# Patient Record
Sex: Female | Born: 1935
Health system: Southern US, Community
[De-identification: ages and names within clinical notes are randomized; demographics above are authoritative.]

## PROBLEM LIST (undated history)

## (undated) DIAGNOSIS — I2510 Atherosclerotic heart disease of native coronary artery without angina pectoris: Secondary | ICD-10-CM

## (undated) DIAGNOSIS — E785 Hyperlipidemia, unspecified: Secondary | ICD-10-CM

## (undated) DIAGNOSIS — R0600 Dyspnea, unspecified: Secondary | ICD-10-CM

## (undated) DIAGNOSIS — I739 Peripheral vascular disease, unspecified: Secondary | ICD-10-CM

## (undated) DIAGNOSIS — K219 Gastro-esophageal reflux disease without esophagitis: Secondary | ICD-10-CM

## (undated) DIAGNOSIS — Z8673 Personal history of transient ischemic attack (TIA), and cerebral infarction without residual deficits: Secondary | ICD-10-CM

## (undated) DIAGNOSIS — H3530 Unspecified macular degeneration: Secondary | ICD-10-CM

## (undated) DIAGNOSIS — K449 Diaphragmatic hernia without obstruction or gangrene: Secondary | ICD-10-CM

## (undated) DIAGNOSIS — K5790 Diverticulosis of intestine, part unspecified, without perforation or abscess without bleeding: Secondary | ICD-10-CM

## (undated) DIAGNOSIS — I639 Cerebral infarction, unspecified: Secondary | ICD-10-CM

## (undated) DIAGNOSIS — G43909 Migraine, unspecified, not intractable, without status migrainosus: Secondary | ICD-10-CM

## (undated) DIAGNOSIS — G500 Trigeminal neuralgia: Secondary | ICD-10-CM

## (undated) DIAGNOSIS — M5416 Radiculopathy, lumbar region: Secondary | ICD-10-CM

## (undated) DIAGNOSIS — R112 Nausea with vomiting, unspecified: Secondary | ICD-10-CM

## (undated) DIAGNOSIS — E119 Type 2 diabetes mellitus without complications: Secondary | ICD-10-CM

## (undated) DIAGNOSIS — C189 Malignant neoplasm of colon, unspecified: Secondary | ICD-10-CM

## (undated) DIAGNOSIS — D649 Anemia, unspecified: Secondary | ICD-10-CM

## (undated) DIAGNOSIS — I719 Aortic aneurysm of unspecified site, without rupture: Secondary | ICD-10-CM

## (undated) DIAGNOSIS — M1990 Unspecified osteoarthritis, unspecified site: Secondary | ICD-10-CM

## (undated) DIAGNOSIS — I701 Atherosclerosis of renal artery: Secondary | ICD-10-CM

## (undated) DIAGNOSIS — I700 Atherosclerosis of aorta: Secondary | ICD-10-CM

## (undated) DIAGNOSIS — F419 Anxiety disorder, unspecified: Secondary | ICD-10-CM

## (undated) DIAGNOSIS — I10 Essential (primary) hypertension: Secondary | ICD-10-CM

## (undated) DIAGNOSIS — I4719 Other supraventricular tachycardia: Secondary | ICD-10-CM

## (undated) DIAGNOSIS — K5909 Other constipation: Secondary | ICD-10-CM

## (undated) DIAGNOSIS — I471 Supraventricular tachycardia: Secondary | ICD-10-CM

## (undated) HISTORY — DX: Peripheral vascular disease, unspecified: I73.9

## (undated) HISTORY — DX: Aortic aneurysm of unspecified site, without rupture: I71.9

## (undated) HISTORY — DX: Diaphragmatic hernia without obstruction or gangrene: K44.9

## (undated) HISTORY — DX: Personal history of transient ischemic attack (TIA), and cerebral infarction without residual deficits: Z86.73

## (undated) HISTORY — DX: Type 2 diabetes mellitus without complications: E11.9

## (undated) HISTORY — DX: Diverticulosis of intestine, part unspecified, without perforation or abscess without bleeding: K57.90

## (undated) HISTORY — DX: Supraventricular tachycardia: I47.1

## (undated) HISTORY — DX: Other supraventricular tachycardia: I47.19

## (undated) HISTORY — DX: Hyperlipidemia, unspecified: E78.5

## (undated) HISTORY — DX: Unspecified macular degeneration: H35.30

## (undated) HISTORY — PX: BREAST BIOPSY: SHX20

## (undated) HISTORY — DX: Atherosclerotic heart disease of native coronary artery without angina pectoris: I25.10

## (undated) HISTORY — PX: CARDIAC SURGERY: SHX584

## (undated) HISTORY — DX: Atherosclerosis of aorta: I70.0

## (undated) HISTORY — DX: Other constipation: K59.09

## (undated) HISTORY — PX: COLONOSCOPY: SHX174

## (undated) HISTORY — DX: Unspecified osteoarthritis, unspecified site: M19.90

## (undated) HISTORY — DX: Radiculopathy, lumbar region: M54.16

## (undated) HISTORY — DX: Gastro-esophageal reflux disease without esophagitis: K21.9

## (undated) HISTORY — DX: Essential (primary) hypertension: I10

## (undated) HISTORY — PX: OTHER SURGICAL HISTORY: SHX169

## (undated) HISTORY — DX: Anxiety disorder, unspecified: F41.9

---
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
## 1979-11-13 HISTORY — PX: LUMBAR DISC SURGERY: SHX700

## 1980-11-12 DIAGNOSIS — T4145XA Adverse effect of unspecified anesthetic, initial encounter: Secondary | ICD-10-CM

## 1980-11-12 HISTORY — DX: Adverse effect of unspecified anesthetic, initial encounter: T41.45XA

## 1980-11-12 HISTORY — PX: ABDOMINAL HYSTERECTOMY: SHX81

## 1989-07-13 HISTORY — PX: BREAST BIOPSY: SHX20

## 1991-11-13 HISTORY — PX: CAROTID ENDARTERECTOMY: SUR193

## 2005-02-28 ENCOUNTER — Emergency Department (HOSPITAL_COMMUNITY): Admission: EM | Admit: 2005-02-28 | Discharge: 2005-02-28 | Payer: Self-pay | Admitting: Emergency Medicine

## 2005-03-06 ENCOUNTER — Encounter: Admission: RE | Admit: 2005-03-06 | Discharge: 2005-03-06 | Payer: Self-pay | Admitting: Internal Medicine

## 2006-01-23 ENCOUNTER — Encounter: Admission: RE | Admit: 2006-01-23 | Discharge: 2006-01-23 | Payer: Self-pay | Admitting: Internal Medicine

## 2006-01-30 ENCOUNTER — Encounter: Admission: RE | Admit: 2006-01-30 | Discharge: 2006-01-30 | Payer: Self-pay | Admitting: Internal Medicine

## 2006-08-06 ENCOUNTER — Ambulatory Visit: Payer: Self-pay | Admitting: Cardiology

## 2006-08-17 ENCOUNTER — Ambulatory Visit: Payer: Self-pay | Admitting: Cardiology

## 2006-09-06 ENCOUNTER — Ambulatory Visit: Payer: Self-pay | Admitting: Cardiology

## 2006-10-09 ENCOUNTER — Ambulatory Visit: Payer: Self-pay | Admitting: Cardiology

## 2007-04-26 ENCOUNTER — Ambulatory Visit: Payer: Self-pay | Admitting: Cardiology

## 2007-05-07 ENCOUNTER — Ambulatory Visit: Payer: Self-pay | Admitting: Internal Medicine

## 2007-06-16 ENCOUNTER — Ambulatory Visit: Payer: Self-pay | Admitting: Cardiology

## 2007-06-23 ENCOUNTER — Ambulatory Visit: Payer: Self-pay | Admitting: Cardiology

## 2007-12-16 ENCOUNTER — Ambulatory Visit: Payer: Self-pay | Admitting: Cardiology

## 2007-12-19 ENCOUNTER — Encounter: Payer: Self-pay | Admitting: Cardiology

## 2007-12-20 ENCOUNTER — Encounter: Payer: Self-pay | Admitting: Cardiology

## 2007-12-20 ENCOUNTER — Inpatient Hospital Stay (HOSPITAL_COMMUNITY): Admission: EM | Admit: 2007-12-20 | Discharge: 2007-12-23 | Payer: Self-pay | Admitting: Emergency Medicine

## 2007-12-20 ENCOUNTER — Ambulatory Visit: Payer: Self-pay | Admitting: Cardiology

## 2007-12-25 ENCOUNTER — Encounter: Payer: Self-pay | Admitting: Cardiology

## 2008-01-06 ENCOUNTER — Ambulatory Visit: Payer: Self-pay | Admitting: Cardiology

## 2008-01-30 ENCOUNTER — Ambulatory Visit: Payer: Self-pay | Admitting: Cardiology

## 2008-02-07 ENCOUNTER — Encounter: Payer: Self-pay | Admitting: Cardiology

## 2008-02-10 ENCOUNTER — Ambulatory Visit: Payer: Self-pay | Admitting: Cardiology

## 2008-06-14 ENCOUNTER — Encounter: Payer: Self-pay | Admitting: Cardiology

## 2008-07-08 ENCOUNTER — Ambulatory Visit: Payer: Self-pay | Admitting: Cardiovascular Disease

## 2008-07-08 LAB — CONVERTED CEMR LAB
Basophils Relative: 0.4 % (ref 0.0–3.0)
Calcium: 10.4 mg/dL (ref 8.4–10.5)
Chloride: 103 meq/L (ref 96–112)
Creatinine, Ser: 0.8 mg/dL (ref 0.4–1.2)
Eosinophils Relative: 2 % (ref 0.0–5.0)
GFR calc Af Amer: 91 mL/min
GFR calc non Af Amer: 76 mL/min
Hemoglobin: 12.9 g/dL (ref 12.0–15.0)
INR: 1 (ref 0.8–1.0)
Lymphocytes Relative: 25.7 % (ref 12.0–46.0)
Monocytes Relative: 10.7 % (ref 3.0–12.0)
Platelets: 236 10*3/uL (ref 150–400)
Potassium: 4 meq/L (ref 3.5–5.1)
Prothrombin Time: 11.9 s (ref 10.9–13.3)
RBC: 4.21 M/uL (ref 3.87–5.11)
RDW: 13.7 % (ref 11.5–14.6)
Sodium: 140 meq/L (ref 135–145)

## 2008-07-14 ENCOUNTER — Ambulatory Visit (HOSPITAL_COMMUNITY): Admission: RE | Admit: 2008-07-14 | Discharge: 2008-07-14 | Payer: Self-pay | Admitting: Cardiovascular Disease

## 2008-07-14 ENCOUNTER — Ambulatory Visit: Payer: Self-pay | Admitting: Cardiovascular Disease

## 2008-08-05 ENCOUNTER — Ambulatory Visit: Payer: Self-pay | Admitting: *Deleted

## 2008-08-25 ENCOUNTER — Inpatient Hospital Stay (HOSPITAL_COMMUNITY): Admission: RE | Admit: 2008-08-25 | Discharge: 2008-08-26 | Payer: Self-pay | Admitting: *Deleted

## 2008-08-25 ENCOUNTER — Ambulatory Visit: Payer: Self-pay | Admitting: *Deleted

## 2008-09-13 ENCOUNTER — Ambulatory Visit: Payer: Self-pay | Admitting: Cardiology

## 2008-09-14 ENCOUNTER — Encounter: Payer: Self-pay | Admitting: Cardiology

## 2008-09-14 DIAGNOSIS — G44229 Chronic tension-type headache, not intractable: Secondary | ICD-10-CM

## 2008-09-14 DIAGNOSIS — R000 Tachycardia, unspecified: Secondary | ICD-10-CM | POA: Insufficient documentation

## 2008-09-14 DIAGNOSIS — I471 Supraventricular tachycardia: Secondary | ICD-10-CM

## 2008-09-14 DIAGNOSIS — I2510 Atherosclerotic heart disease of native coronary artery without angina pectoris: Secondary | ICD-10-CM

## 2008-09-14 DIAGNOSIS — I739 Peripheral vascular disease, unspecified: Secondary | ICD-10-CM | POA: Insufficient documentation

## 2008-09-14 DIAGNOSIS — F411 Generalized anxiety disorder: Secondary | ICD-10-CM

## 2008-09-24 ENCOUNTER — Encounter: Payer: Self-pay | Admitting: Cardiology

## 2008-09-30 ENCOUNTER — Ambulatory Visit: Payer: Self-pay | Admitting: *Deleted

## 2008-09-30 ENCOUNTER — Encounter: Admission: RE | Admit: 2008-09-30 | Discharge: 2008-09-30 | Payer: Self-pay | Admitting: Gastroenterology

## 2008-11-02 ENCOUNTER — Ambulatory Visit: Payer: Self-pay | Admitting: Cardiology

## 2008-12-31 ENCOUNTER — Ambulatory Visit: Payer: Self-pay | Admitting: Cardiology

## 2009-01-03 ENCOUNTER — Ambulatory Visit: Payer: Self-pay | Admitting: Cardiology

## 2009-02-08 ENCOUNTER — Ambulatory Visit: Payer: Self-pay | Admitting: Cardiology

## 2009-02-16 ENCOUNTER — Encounter: Payer: Self-pay | Admitting: Cardiology

## 2009-02-17 ENCOUNTER — Encounter: Payer: Self-pay | Admitting: Cardiology

## 2009-04-06 ENCOUNTER — Encounter: Payer: Self-pay | Admitting: Cardiology

## 2009-04-06 ENCOUNTER — Encounter: Payer: Self-pay | Admitting: Cardiovascular Disease

## 2009-08-11 ENCOUNTER — Ambulatory Visit: Payer: Self-pay | Admitting: Cardiology

## 2009-08-11 DIAGNOSIS — I7389 Other specified peripheral vascular diseases: Secondary | ICD-10-CM | POA: Insufficient documentation

## 2009-08-11 DIAGNOSIS — I10 Essential (primary) hypertension: Secondary | ICD-10-CM | POA: Insufficient documentation

## 2009-08-24 ENCOUNTER — Encounter (INDEPENDENT_AMBULATORY_CARE_PROVIDER_SITE_OTHER): Payer: Self-pay | Admitting: *Deleted

## 2009-08-29 ENCOUNTER — Ambulatory Visit: Payer: Self-pay | Admitting: Cardiovascular Disease

## 2009-08-29 ENCOUNTER — Encounter (INDEPENDENT_AMBULATORY_CARE_PROVIDER_SITE_OTHER): Payer: Self-pay

## 2009-08-29 DIAGNOSIS — I70219 Atherosclerosis of native arteries of extremities with intermittent claudication, unspecified extremity: Secondary | ICD-10-CM | POA: Insufficient documentation

## 2009-08-31 ENCOUNTER — Ambulatory Visit: Payer: Self-pay | Admitting: Cardiovascular Disease

## 2009-08-31 ENCOUNTER — Ambulatory Visit (HOSPITAL_COMMUNITY): Admission: RE | Admit: 2009-08-31 | Discharge: 2009-09-01 | Payer: Self-pay | Admitting: Cardiovascular Disease

## 2009-09-13 ENCOUNTER — Encounter: Payer: Self-pay | Admitting: Cardiology

## 2009-09-14 ENCOUNTER — Ambulatory Visit: Payer: Self-pay | Admitting: Cardiology

## 2009-10-14 IMAGING — CR DG CHEST 1V PORT
1 series · 1 of 1 positions shown · non-contrast
Comparison: none

CLINICAL DATA: Short of breath

Portable chest at 657:
No previous for comparison. Minimal linear scarring or atelectasis of the left
lung base. Right lung clear. Heart size normal. No effusion. Visualized bones
unremarkable.

[AP]
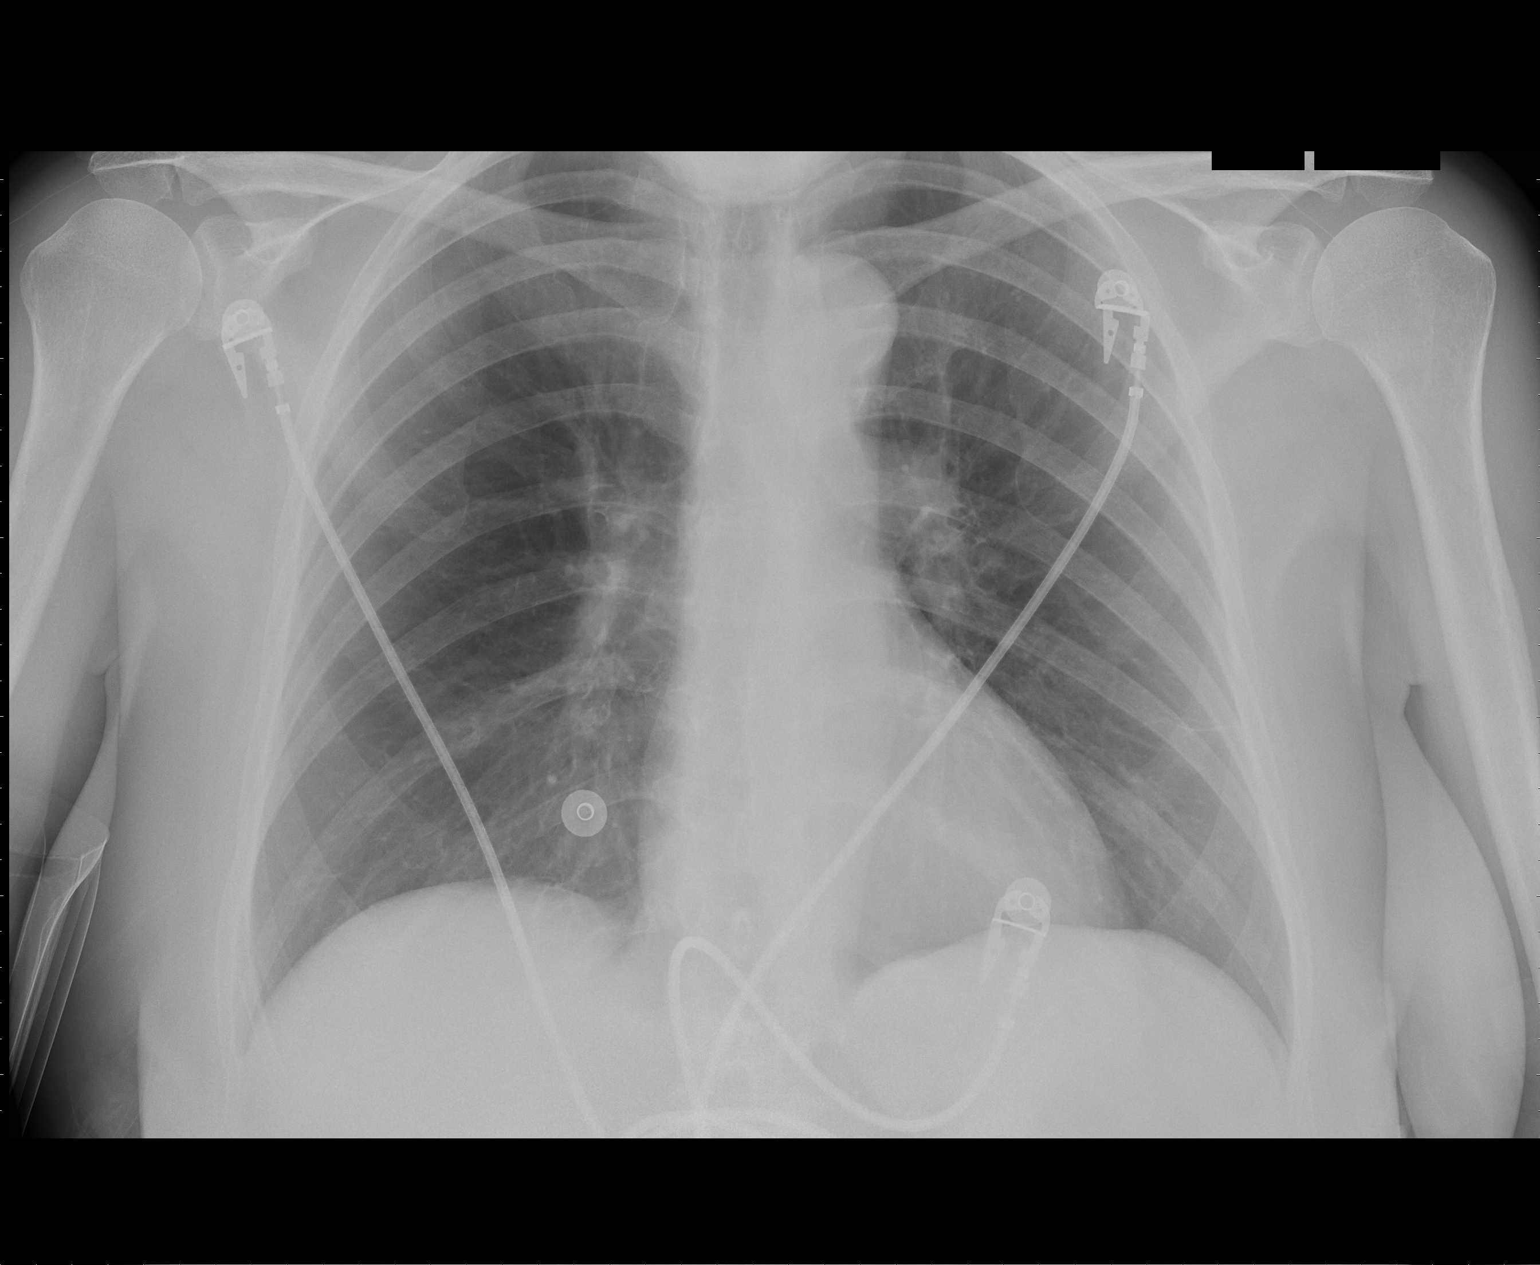

[1 of 1 positions shown; findings below may reference images not displayed]

IMPRESSION: 1. Minimal linear scarring or atelectasis, left lower lung

## 2009-10-21 ENCOUNTER — Encounter: Payer: Self-pay | Admitting: Cardiovascular Disease

## 2009-10-21 ENCOUNTER — Ambulatory Visit: Payer: Self-pay | Admitting: Vascular Surgery

## 2009-11-29 ENCOUNTER — Encounter: Payer: Self-pay | Admitting: Cardiology

## 2010-02-16 ENCOUNTER — Encounter: Payer: Self-pay | Admitting: Cardiology

## 2010-04-03 ENCOUNTER — Ambulatory Visit: Payer: Self-pay | Admitting: Cardiology

## 2010-04-03 DIAGNOSIS — E785 Hyperlipidemia, unspecified: Secondary | ICD-10-CM | POA: Insufficient documentation

## 2010-04-03 DIAGNOSIS — E7800 Pure hypercholesterolemia, unspecified: Secondary | ICD-10-CM | POA: Insufficient documentation

## 2010-04-20 ENCOUNTER — Telehealth (INDEPENDENT_AMBULATORY_CARE_PROVIDER_SITE_OTHER): Payer: Self-pay | Admitting: *Deleted

## 2010-06-09 ENCOUNTER — Encounter: Payer: Self-pay | Admitting: Cardiology

## 2010-06-14 ENCOUNTER — Encounter: Payer: Self-pay | Admitting: Cardiology

## 2010-06-15 ENCOUNTER — Telehealth (INDEPENDENT_AMBULATORY_CARE_PROVIDER_SITE_OTHER): Payer: Self-pay | Admitting: *Deleted

## 2010-08-04 ENCOUNTER — Ambulatory Visit: Payer: Self-pay | Admitting: Cardiology

## 2010-11-09 ENCOUNTER — Ambulatory Visit: Payer: Self-pay | Admitting: Vascular Surgery

## 2010-12-03 ENCOUNTER — Encounter: Payer: Self-pay | Admitting: Internal Medicine

## 2010-12-10 LAB — CONVERTED CEMR LAB
BUN: 19 mg/dL (ref 6–23)
Basophils Absolute: 0 10*3/uL (ref 0.0–0.1)
CO2: 30 meq/L (ref 19–32)
Calcium: 10.2 mg/dL (ref 8.4–10.5)
Eosinophils Absolute: 0.1 10*3/uL (ref 0.0–0.7)
HCT: 36.3 % (ref 36.0–46.0)
INR: 1 (ref 0.8–1.0)
Lymphs Abs: 0.9 10*3/uL (ref 0.7–4.0)
MCHC: 34 g/dL (ref 30.0–36.0)
MCV: 85.4 fL (ref 78.0–100.0)
Neutro Abs: 1.9 10*3/uL (ref 1.4–7.7)
Platelets: 231 10*3/uL (ref 150.0–400.0)
Prothrombin Time: 9.9 s (ref 9.1–11.7)
RDW: 13.5 % (ref 11.5–14.6)
Sodium: 137 meq/L (ref 135–145)
WBC: 3.2 10*3/uL — ABNORMAL LOW (ref 4.5–10.5)

## 2010-12-12 NOTE — Assessment & Plan Note (Signed)
Summary: heart skipping, dizziness --agh   Visit Type:  Follow-up Primary Provider:  Dr. Sherril Croon   History of Present Illness: the patient is a 75 year old female with a history of aortic penetrating ulcer requiring endograft repair September 2009. The patient has moderate coronary artery disease. She has a history of difficult to control hypertension. she has unilateral renal artery stenosis. She also has a significant stenosis of the right iliofemoral limb of her endograft causing exertional claudication. Her claudication symptoms however have much improved. She walks on a regular basis. She denies any chest pain. She has no shortness of breath orthopnea PND.  The patient reports occasional palpitations. She denies any dizziness or weakness. She reports however no sustained arrhythmias. She continues to have right leg pain. Headaches overall much improved.   Preventive Screening-Counseling & Management  Alcohol-Tobacco     Smoking Status: quit     Year Quit: 1981  Current Medications (verified): 1)  Adult Aspirin Low Strength 81 Mg Tbdp (Aspirin) .... Take 1 Tablet By Mouth Once A Day 2)  Colace 100 Mg Caps (Docusate Sodium) .... Take 1 Capsule By Mouth Two Times A Day 3)  Lipitor 40 Mg Tabs (Atorvastatin Calcium) .... Take 1/2 Tab (20mg ) At Bedtime 4)  Clonazepam 0.5 Mg Tabs (Clonazepam) .... Take 1/4 Tablet At Bedtime 5)  Labetalol Hcl 200 Mg Tabs (Labetalol Hcl) .... Take 1 Tablet By Mouth Two Times A Day 6)  Lisinopril 20 Mg Tabs (Lisinopril) .... Take 1 Tab By Mouth At Bedtime 7)  Prilosec Otc 20 Mg Tbec (Omeprazole Magnesium) .... Take 1 Tablet By Mouth Once A Day 8)  Icaps Mv  Tabs (Multiple Vitamins-Minerals) .... Take 1 Tablet By Mouth Two Times A Day 9)  Advil 200 Mg Tabs (Ibuprofen) .... As Needed 10)  Oscal 500/200 D-3 500-200 Mg-Unit Tabs (Calcium-Vitamin D) .... Take 1 Tablet By Mouth Two Times A Day 11)  Miralax  Powd (Polyethylene Glycol 3350) .Marland Kitchen.. 17gm Mixed With 8oz  Water or Juice  By Mouth Daily As Needed  Allergies (verified): 1)  ! Codeine 2)  Premarin 3)  Suprax  Comments:  Nurse/Medical Assistant: The patient's medication bottles and allergies were reviewed with the patient and were updated in the Medication and Allergy Lists.  Past History:  Past Medical History: Last updated: September 23, 2008 hypertension poorly controlled history of left carotid endarterectomy.  Carotid arteries patent 2009 recurrent tachypalpitations.  Cardiac monitor x 2.  Evaluated by Dr. Alberteen Spindle in June of 2008 atrial tachycardia ruled out, adrenaline or excess adrenergic sensitivity suspected.  Cardiolite October 2007, status post cardiac catheterization in the mid 90s, nonobstructive coronary artery disease. Diabetes mellitus, per patient diet control. Chronic migraine headaches. Macular degeneration chronic left lower quadrant pain, not resolved post stenting aortic penetrating ulcer distal aortic penetrating ulcer, treated with endograft.  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 Left-sided diverticulosis noted on colonoscopy April 2007 chronic constipation, patient states secondary to verapamil but present prior to its use esophageal reflux disease questionable cough on ACE inhibitors, currently tolerating chronic low back pain, lumbosacral spine changes were noted on MRI scan with left radiculopathy February of 2006  Past Surgical History: Last updated: 09/23/2008  Penetrating atherosclerotic ulcer, infrarenal   aorta.   Placement of Endologix PowerLink bifurcated covered stent   graft. status-post hysterectomy tatus post tonsillectomy  Family History: Last updated: 23-Sep-2008 Mother died age 70 with a history of coronary artery   disease and congestive heart failure.  Father died age 18 of  an MI.   Three deceased brothers with histories of diabetes and coronary artery   disease.  Three deceased sisters with similar  pathology.   Social History: Last updated: 09/14/2008 The patient is married with one child.  She is a   retired Solicitor at [employer].  No recent tobacco use.  Discontinued   tobacco in 1981.  No regular alcohol use.      Risk Factors: Smoking Status: quit (08/04/2010) Passive Smoke Exposure: no (09/14/2008)  Past Cardiac History:  [facility] Hospitalizations:  DATE OF DISCHARGE:  09/01/2009                                  DISCHARGE SUMMARY      PROCEDURES:   1. Left subclavian angiogram.   2. Abdominal aortogram with runoff to the feet.   3. Selective right renal angiography.   4. Two-view chest x-ray.      PRIMARY FINAL DISCHARGE DIAGNOSIS:  Peripheral vascular disease with   total right iliac occlusion, urine drug screen referral made.      SECONDARY DIAGNOSES:   1. Status post aortic graft-stent placement for distal aortic ulcer.   2. Remote history of tobacco use.   3. Headaches.   4. Anxiety.   5. Tachycardia.   6. Allergy or intolerance to CODEINE, PREMARIN, and SUPRAX.   7. Status post left carotid endarterectomy.   8. History of nonobstructive coronary artery disease by cardiac       catheterization greater than 10 years ago, Cardiolite in 2007       without ischemia.   9. Diabetes.   10.Macular degeneration.   11.Right trigeminal neuralgia.   12.Diverticulosis.   13.Gastroesophageal reflux disease.   14.Low back pain.   15.Status post hysterectomy and tonsillectomy.   16.Family history of coronary artery disease in her father.   Review of Systems       The patient complains of fatigue and anxiety.  The patient denies malaise, fever, weight gain/loss, vision loss, decreased hearing, hoarseness, chest pain, palpitations, shortness of breath, prolonged cough, wheezing, sleep apnea, coughing up blood, abdominal pain, blood in stool, nausea, vomiting, diarrhea, heartburn, incontinence, blood in urine, muscle weakness, joint pain, leg swelling, rash, skin lesions,  headache, fainting, dizziness, depression, enlarged lymph nodes, easy bruising or bleeding, and environmental allergies.    Vital Signs:  Patient profile:   75 year old female Height:      62 inches Weight:      124 pounds Pulse rate:   87 / minute BP sitting:   190 / 115  (left arm) Cuff size:   regular  Vitals Entered By: Carlye Grippe (August 04, 2010 10:21 AM)  Serial Vital Signs/Assessments:  Time      Position  BP       Pulse  Resp  Temp     By 10:25 AM            173/109  82                    Carlye Grippe 10:26 AM            173/89   76                    Carlye Grippe  Comments: 10:25 AM left arm/reg cuff By: Carlye Grippe  10:26 AM right arm/reg cuff By: Carlye Grippe    Physical Exam  Additional Exam:  General: Well-developed, well-nourished in no distress head: Normocephalic and atraumatic eyes PERRLA/EOMI intact, conjunctiva and lids normal nose: No deformity or lesions mouth normal dentition, normal posterior pharynx neck: Supple, no JVD.  No masses, thyromegaly or abnormal cervical nodes lungs: Normal breath sounds bilaterally without wheezing.  Normal percussion heart: regular rate and rhythm with normal S1 and S2, no S3 or S4.  PMI is normal.  No pathological murmurs abdomen: Normal bowel sounds, abdomen is soft and nontender without masses, organomegaly or hernias noted.  No hepatosplenomegaly musculoskeletal: Back normal, normal gait muscle strength and tone normal pulsus: unable to palpate pulse in right posterior tibial and dorsalis pedis. Pulses in left leg normal. Extremities: No peripheral pitting edema neurologic: Alert and oriented x 3 skin: Intact without lesions or rashes cervical nodes: No significant adenopathy psychologic: Normal affect    Impression & Recommendations:  Problem # 1:  CHRONIC TENSION TYPE HEADACHE (ICD-339.12) Assessment Improved  Problem # 2:  ATHEROSLERO NATV ART EXTREM W/INTERMIT CLAUDICAT  (ICD-440.21) the patient has a known stenosis of the right iliofemoral limb of her endograft causing exertional claudication. Symptoms are stable. She has been evaluated by vascular surgery.  Problem # 3:  ESSENTIAL HYPERTENSION, BENIGN (ICD-401.1) blood pressure under poor control. We will increase labetalol to 200 mg p.o. b.i.d. The following medications were removed from the medication list:    Hydrochlorothiazide 25 Mg Tabs (Hydrochlorothiazide) .Marland Kitchen... Take 1/2 tablet daily Her updated medication list for this problem includes:    Adult Aspirin Low Strength 81 Mg Tbdp (Aspirin) .Marland Kitchen... Take 1 tablet by mouth once a day    Labetalol Hcl 200 Mg Tabs (Labetalol hcl) .Marland Kitchen... Take 1 tablet by mouth two times a day    Lisinopril 20 Mg Tabs (Lisinopril) .Marland Kitchen... Take 1 tab by mouth at bedtime  Problem # 4:  GENERALIZED ANXIETY DISORDER (ICD-300.02) Assessment: Comment Only  Other Orders: EKG w/ Interpretation (93000)  Patient Instructions: 1)  Increase Labetalol to 200mg  two times a day  2)  Follow up in  6 months Prescriptions: LABETALOL HCL 200 MG TABS (LABETALOL HCL) Take 1 tablet by mouth two times a day  #180 x 0   Entered by:   Hoover Brunette, LPN   Authorized by:   Lewayne Bunting, MD, [facility]   Signed by:   Hoover Brunette, LPN on 16/08/9603   Method used:   Faxed to ...       [facility] [employer] education [employer])       [address] [address]       [place], [place]  [address]       Ph: [phone]       Fax: [phone]   RxID:   [id number]

## 2010-12-12 NOTE — Assessment & Plan Note (Signed)
Summary: 6 MO FU PER MAY REMINDE   Visit Type:  Follow-up Primary Provider:  Dr. Sherril Croon   History of Present Illness: the patient is a 75 year old female with a history of aortic penetrating ulcer requiring endograft repair September 2009. The patient has moderate coronary artery disease. She has a history of difficult to control hypertension. she has unilateral renal artery stenosis. She also has a significant stenosis of the right iliofemoral limb of her endograft causing exertional claudication. Her claudication symptoms however have much improved. She walks on a regular basis. She denies any chest pain. She has no shortness of breath orthopnea PND.  The patient also has peripheral vascular disease including right carotid endarterectomy. She follows up with her primary care physician regarding this. She has a known left soft carotid bruit. She still gets intermittent headaches her mood overall is improved significantly as he sleeping better. From a cardiovascular standpoint is stable and she denies any orthopnea PND palpitations or syncope.  The patient also complains of some muscle aches and pain in both hips and she wants to decrease her Lipitor.  Preventive Screening-Counseling & Management  Alcohol-Tobacco     Smoking Status: quit  Comments: smoked for about 1 yr, quit in 1981  Current Medications (verified): 1)  Adult Aspirin Low Strength 81 Mg Tbdp (Aspirin) .... Take 1 Tablet By Mouth Once A Day 2)  Colace 100 Mg Caps (Docusate Sodium) .... Take 1 Capsule By Mouth Two Times A Day 3)  Lipitor 40 Mg Tabs (Atorvastatin Calcium) .... Take 1/2 Tab (20mg ) At Bedtime 4)  Clonazepam 0.5 Mg Tabs (Clonazepam) .... Take 1/4 Tablet At Bedtime 5)  Hydrochlorothiazide 25 Mg Tabs (Hydrochlorothiazide) .... Take 1/2 Tablet Daily 6)  Labetalol Hcl 200 Mg Tabs (Labetalol Hcl) .... Take 1/2 Tablet Every Morning and 1 Tablet At Bedtime 7)  Lisinopril 20 Mg Tabs (Lisinopril) .... Take 1 Tab By Mouth  At Bedtime 8)  Prilosec Otc 20 Mg Tbec (Omeprazole Magnesium) .... Take 1 Tablet By Mouth Once A Day 9)  Icaps Mv  Tabs (Multiple Vitamins-Minerals) .... Take 1 Tablet By Mouth Two Times A Day 10)  Advil 200 Mg Tabs (Ibuprofen) .... As Needed 11)  Oscal 500/200 D-3 500-200 Mg-Unit Tabs (Calcium-Vitamin D) .... Take 1 Tablet By Mouth Two Times A Day  Allergies: 1)  ! Codeine 2)  Premarin 3)  Suprax  Comments:  Nurse/Medical Assistant: The patient's medications were reviewed with the patient and were updated in the Medication List. Pt brought medication bottles to office visit.  Cyril Loosen, RN, BSN (Apr 03, 2010 9:44 AM)  Past History:  Past Medical History: Last updated: 09/14/2008 hypertension poorly controlled history of left carotid endarterectomy.  Carotid arteries patent 2009 recurrent tachypalpitations.  Cardiac monitor x 2.  Evaluated by Dr. Alberteen Spindle in June of 2008 atrial tachycardia ruled out, adrenaline or excess adrenergic sensitivity suspected.  Cardiolite October 2007, status post cardiac catheterization in the mid 90s, nonobstructive coronary artery disease. Diabetes mellitus, per patient diet control. Chronic migraine headaches. Macular degeneration chronic left lower quadrant pain, not resolved post stenting aortic penetrating ulcer distal aortic penetrating ulcer, treated with endograft.  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 Left-sided diverticulosis noted on colonoscopy April 2007 chronic constipation, patient states secondary to verapamil but present prior to its use esophageal reflux disease questionable cough on ACE inhibitors, currently tolerating chronic low back pain, lumbosacral spine changes were noted on MRI scan with left radiculopathy February of 2006  Past Surgical  History: Last updated: September 29, 2008  Penetrating atherosclerotic ulcer, infrarenal   aorta.   Placement of Endologix PowerLink bifurcated covered  stent   graft. status-post hysterectomy tatus post tonsillectomy  Family History: Last updated: Sep 29, 2008 Mother died age 45 with a history of coronary artery   disease and congestive heart failure.  Father died age 71 of an MI.   Three deceased brothers with histories of diabetes and coronary artery   disease.  Three deceased sisters with similar pathology.   Social History: Last updated: 2008/09/29 The patient is married with one child.  She is a   retired Solicitor at [employer].  No recent tobacco use.  Discontinued   tobacco in 1981.  No regular alcohol use.      Risk Factors: Smoking Status: quit (04/03/2010) Passive Smoke Exposure: no (09-29-2008)  Past Cardiac History:  [facility] Hospitalizations:  DATE OF DISCHARGE:  09/01/2009                                  DISCHARGE SUMMARY      PROCEDURES:   1. Left subclavian angiogram.   2. Abdominal aortogram with runoff to the feet.   3. Selective right renal angiography.   4. Two-view chest x-ray.      PRIMARY FINAL DISCHARGE DIAGNOSIS:  Peripheral vascular disease with   total right iliac occlusion, urine drug screen referral made.      SECONDARY DIAGNOSES:   1. Status post aortic graft-stent placement for distal aortic ulcer.   2. Remote history of tobacco use.   3. Headaches.   4. Anxiety.   5. Tachycardia.   6. Allergy or intolerance to CODEINE, PREMARIN, and SUPRAX.   7. Status post left carotid endarterectomy.   8. History of nonobstructive coronary artery disease by cardiac       catheterization greater than 10 years ago, Cardiolite in 2007       without ischemia.   9. Diabetes.   10.Macular degeneration.   11.Right trigeminal neuralgia.   12.Diverticulosis.   13.Gastroesophageal reflux disease.   14.Low back pain.   15.Status post hysterectomy and tonsillectomy.   16.Family history of coronary artery disease in her father.   Review of Systems       The patient complains of fatigue.  The patient denies  malaise, fever, weight gain/loss, vision loss, decreased hearing, hoarseness, chest pain, palpitations, shortness of breath, prolonged cough, wheezing, sleep apnea, coughing up blood, abdominal pain, blood in stool, nausea, vomiting, diarrhea, heartburn, incontinence, blood in urine, muscle weakness, joint pain, leg swelling, rash, skin lesions, headache, fainting, dizziness, depression, anxiety, enlarged lymph nodes, easy bruising or bleeding, and environmental allergies.    Vital Signs:  Patient profile:   75 year old female Height:      62 inches Weight:      124.50 pounds BMI:     22.85 Pulse rate:   76 / minute BP sitting:   145 / 81  (left arm) Cuff size:   regular  Vitals Entered By: Cyril Loosen, RN, BSN (Apr 03, 2010 9:39 AM) Comments Folow up appt, No cardiac complaints   Physical Exam  Additional Exam:  General: Well-developed, well-nourished in no distress head: Normocephalic and atraumatic eyes PERRLA/EOMI intact, conjunctiva and lids normal nose: No deformity or lesions mouth normal dentition, normal posterior pharynx neck: Supple, no JVD.  No masses, thyromegaly or abnormal cervical nodes lungs: Normal breath sounds bilaterally without wheezing.  Normal  percussion heart: regular rate and rhythm with normal S1 and S2, no S3 or S4.  PMI is normal.  No pathological murmurs abdomen: Normal bowel sounds, abdomen is soft and nontender without masses, organomegaly or hernias noted.  No hepatosplenomegaly musculoskeletal: Back normal, normal gait muscle strength and tone normal pulsus: unable to palpate pulse in right posterior tibial and dorsalis pedis. Pulses in left leg normal. Extremities: No peripheral pitting edema neurologic: Alert and oriented x 3 skin: Intact without lesions or rashes cervical nodes: No significant adenopathy psychologic: Normal affect    EKG  Procedure date:  04/03/2010  Findings:      normal sinus rhythm low-voltage QRS. Heart rate 67  beats per minute  Impression & Recommendations:  Problem # 1:  ATHEROSLERO NATV ART EXTREM W/INTERMIT CLAUDICAT (ICD-440.21)  Orders: EKG w/ Interpretation (93000)  Problem # 2:  ESSENTIAL HYPERTENSION, BENIGN (ICD-401.1) blood pressure under better control. Continue current medical regimen Her updated medication list for this problem includes:    Adult Aspirin Low Strength 81 Mg Tbdp (Aspirin) .Marland Kitchen... Take 1 tablet by mouth once a day    Hydrochlorothiazide 25 Mg Tabs (Hydrochlorothiazide) .Marland Kitchen... Take 1/2 tablet daily    Labetalol Hcl 200 Mg Tabs (Labetalol hcl) .Marland Kitchen... Take 1/2 tablet every morning and 1 tablet at bedtime    Lisinopril 20 Mg Tabs (Lisinopril) .Marland Kitchen... Take 1 tab by mouth at bedtime  Orders: EKG w/ Interpretation (93000)  Problem # 3:  GENERALIZED ANXIETY DISORDER (ICD-300.02)  Problem # 4:  BEN HTN HEART DISEASE WITHOUT HEART FAIL (ICD-402.10) Assessment: Comment Only  Her updated medication list for this problem includes:    Adult Aspirin Low Strength 81 Mg Tbdp (Aspirin) .Marland Kitchen... Take 1 tablet by mouth once a day    Hydrochlorothiazide 25 Mg Tabs (Hydrochlorothiazide) .Marland Kitchen... Take 1/2 tablet daily    Labetalol Hcl 200 Mg Tabs (Labetalol hcl) .Marland Kitchen... Take 1/2 tablet every morning and 1 tablet at bedtime    Lisinopril 20 Mg Tabs (Lisinopril) .Marland Kitchen... Take 1 tab by mouth at bedtime  Problem # 5:  DYSLIPIDEMIA (ICD-272.4) the patient wants to cut her Lipitor in half. She feels she has to myalgias related to her statin. I told her to followup with lipid panel in 8 weeks. Her updated medication list for this problem includes:    Lipitor 40 Mg Tabs (Atorvastatin calcium) .Marland Kitchen... Take 1/2 tab (20mg ) at bedtime  Patient Instructions: 1)  Decrease Lipitor to 20mg  at bedtime 2)  Labs:  8 weeks - check lipids  3)  Follow up in  1 year

## 2010-12-12 NOTE — Progress Notes (Signed)
Summary: need appointment  Phone Note Other Incoming   Summary of Call: Message left on voice mail stating she would like to get appointment with GD as soon as possible. Left message to return call.  Hoover Brunette, LPN  June 15, 2010 2:41 PM   Follow-up for Phone Call        Pt. c/o feeling like heart starts and stops, skips.  States has been running on average 140-150's/75-80's.  Heart rate running around 60 - 70's.  Has been going on now x 2-3 weeks.  Does c/o dizziness with constant headache.  Patient cancelled appt. with Dr. Excell Seltzer because she did not feel like that was the direction that she needed to go. Follow-up by: Hoover Brunette, LPN,  June 15, 2010 4:33 PM  Additional Follow-up for Phone Call Additional follow up Details #1::        OK schedule 1st available.  Additional Follow-up by: Lewayne Bunting, MD, [facility],  June 18, 2010 6:01 PM    Additional Follow-up for Phone Call Additional follow up Details #2::    Patient notified.   OV scheduled for 9/23 at 10:15.  Follow-up by: Hoover Brunette, LPN,  June 19, 2010 5:15 PM

## 2010-12-12 NOTE — Progress Notes (Signed)
Summary: Dr. Garrison [place]   Phone Note Other Incoming   Summary of Call: Per Dr. Andee Lineman, pt. recently in ER for elevated blood pressure.  Dr. Sherril Croon sent her home.  Needs to have appt. scheduled with Dr. Excell Seltzer to evaluate for refractory hypertension and poss. renal artery stenosis.  Appointment scheduled for:    Tuesday, June 28 at 4:15.  Patient notified.   Hoover Brunette, LPN  April 20, 1609 2:33 PM

## 2010-12-12 NOTE — Miscellaneous (Signed)
Summary: Orders Update - [facility]   Clinical Lists Changes  Orders: Added new Test order of T-Lipid Profile [phone]) - Signed Added new Test order of T-Hepatic Function [phone]) - Signed

## 2011-02-15 ENCOUNTER — Ambulatory Visit (INDEPENDENT_AMBULATORY_CARE_PROVIDER_SITE_OTHER): Payer: Medicare Other

## 2011-02-15 ENCOUNTER — Encounter (INDEPENDENT_AMBULATORY_CARE_PROVIDER_SITE_OTHER): Payer: Medicare Other

## 2011-02-15 DIAGNOSIS — I7092 Chronic total occlusion of artery of the extremities: Secondary | ICD-10-CM

## 2011-02-15 DIAGNOSIS — I70219 Atherosclerosis of native arteries of extremities with intermittent claudication, unspecified extremity: Secondary | ICD-10-CM

## 2011-02-15 LAB — CBC
MCV: 86.1 fL (ref 78.0–100.0)
MCV: 86.8 fL (ref 78.0–100.0)
Platelets: 238 10*3/uL (ref 150–400)
RBC: 3.81 MIL/uL — ABNORMAL LOW (ref 3.87–5.11)
WBC: 4.4 10*3/uL (ref 4.0–10.5)
WBC: 4.7 10*3/uL (ref 4.0–10.5)

## 2011-02-15 LAB — BASIC METABOLIC PANEL
Chloride: 103 mEq/L (ref 96–112)
Creatinine, Ser: 0.86 mg/dL (ref 0.4–1.2)
GFR calc Af Amer: >60 mL/min (ref >60)

## 2011-02-15 LAB — GLUCOSE, CAPILLARY
Glucose-Capillary: 102 mg/dL — ABNORMAL HIGH (ref 70–99)
Glucose-Capillary: 87 mg/dL (ref 70–99)

## 2011-02-15 NOTE — H&P (Signed)
HISTORY AND PHYSICAL EXAMINATION  February 15, 2011  Re:  [name], [name]                DOB:  [birth year]  HISTORY OF PRESENT ILLNESS:  The patient is a 75 year old female who presents today for routine follow-up with lower extremity arterial duplex.  The patient is status post Endologix stent graft placement by Dr. Madilyn Fireman in October 2009.  The patient was initially seen and followed by Dr. Madilyn Fireman, at which time she had palpable femoral pulses and normal ABIs.  She then did not return for follow-up appointments.  Since that time, she had been seen by Dr. Arbie Cookey in December 2000 after she had recently undergone an arteriogram by Dr. Excell Seltzer.  This had revealed an occlusion of her right limb of the Endologix aortobifemoral graft with reconstitution of the external iliac via internal iliac collaterals. She had normal SFA, popliteal, and 3-vessel runoffs bilaterally.  At that time, the patient did report moderate claudication in her right leg; however, it was life-limiting.  She denied rest pain and wounds at that time.  The patient also denied left lower extremity symptoms.  ABIs were not performed at that time.  Dr. Arbie Cookey explained to the patient that she should return in 1 year for continued follow-up with repeat ABIs.  She was also instructed to call if her claudication symptoms progressed or worsened.  Today the patient presents after having lower extremity arterial duplex performed.  The patient continues to note intermittent claudication in the right lower extremity.  She feels it has not progressed or changed since her last evaluation by Dr. Arbie Cookey in 2010.  She still denies rest pain and tissue loss.  She also denies symptoms in the left lower extremity.  The patient is able to carry out her activities of daily living with limited interruption.  The patient denies chest pain, shortness of breath, nausea, vomiting, signs of TIA, CVA, diarrhea, and  constipation.  PAST MEDICAL HISTORY: 1. Penetrating aortic ulcer, status post Endologix stent graft     placement in October 2009 by Dr. Madilyn Fireman. 2. Non insulin dependent diabetes mellitus. 3. Hypertension. 4. Hypercholesterolemia. 5. Palpitations. 6. Migraine headaches. 7. Macular degeneration. 8. Status post ruptured disk repair. 9. Hysterectomy. 10.Left carotid endarterectomy. 11.Tonsillectomy.  SOCIAL HISTORY:  The patient is married with 1 child.  She is retired. She quit smoking in 1981, and she does not drink alcohol on a regular basis.  FAMILY HISTORY:  CHF and coronary artery disease.  MEDICATIONS: 1. Aspirin 81 mg daily. 2. Colace t.i.d. 3. Lipitor 40 mg 1/2 tablet q.h.s. 4. Clonazepam  0.5 mg 1/4 tablets q.h.s. 5. Labetalol 200 mg 1 p.o. b.i.d. 6. Lisinopril 20 mg 1 q.h.s. 7. Prilosec 20 mg daily. 8. Advil p.r.n. 9. Os-Cal daily. 10.MiraLax daily.  ALLERGIES:  CONTRAST DYE, which the patient states she believes causes an elevation in her blood pressure.  REVIEW OF SYSTEMS:  The patient notes claudication, palpitations, reflux, constipation, chronic headaches, and frequent urination.  A complete review of systems is otherwise negative.  PHYSICAL EXAM:  In general, this is a well-nourished female in no acute distress.  Her blood pressure is noted to be 215/99 in the right arm sitting with an O2 saturation of 98% and a heart rate of 73.  HEENT: PERRL, EOMI.  Conjunctivae are normal and pink.  Lungs: Clear to auscultation.  Cardiovascular: Regular rate and rhythm.  Abdomen: Soft, nontender with active bowel sounds.  Musculoskeletal: No major deformities or  cyanosis are noted.  There are 2+ radial pulses present bilaterally.  There are 2+ left femoral popliteal and posterior tibial pulses.  The right femoral and distal pulses are not palpable. Bilateral lower extremities are warm and pink.  Neuro: No focal weakness or paresthesias.  Skin: No ulcers, no  rashes.  IMAGING:  Lower extremity arterial duplex performed on February 15, 2011 reveals an ABI of 0.56 on the right and 1.04 on the left.  These are compared with ABIs performed in 2009 which revealed an ABI of 0.9 on the right and 0.9 on the left.  She currently has biphasic flow in the right posterior tibial and dorsalis pedis and triphasic flow in the left posterior tibial and dorsalis pedis.  ASSESSMENT: 1. Status post Endologix stent graft with known occluded right limb. 2. Uncontrolled hypertension, diabetes mellitus, and hyperlipidemia.  PLAN: 1. The patient's claudication symptoms are stable.  She continues to     deny rest pain and tissue loss.  The patient is currently     comfortable with her symptoms status.  She will follow up in 6     months with a repeat lower extremity arterial duplex and ABIs.  She     understands that she will contact us immediately if she notices     progression of her claudication or she develops rest pain or tissue     loss. 2. The patient's uncontrolled hypertension is being addressed by her     cardiologist, Dr. Andee Lineman.  The patient took a half tablet of     clonidine here in the office, as she states that was what she was     instructed to by her cardiologist if her pressure was noted to be     elevated.  She is instructed to continue to monitor her blood     pressure closely and contact Dr. Andee Lineman with all information     regarding this.  She has a follow-up upon with Dr. Andee Lineman next week     regarding her blood pressure.  The remainder of her medical issues     will continue to be followed by her primary care physician.  Pecola Leisure, PA  Charles E. Fields, MD Electronically Signed  AY/MEDQ  D:  02/15/2011  T:  02/15/2011  Job:  [id number]

## 2011-02-27 ENCOUNTER — Encounter: Payer: Self-pay | Admitting: *Deleted

## 2011-02-27 ENCOUNTER — Encounter: Payer: Self-pay | Admitting: Cardiology

## 2011-02-27 ENCOUNTER — Ambulatory Visit (INDEPENDENT_AMBULATORY_CARE_PROVIDER_SITE_OTHER): Payer: Medicare Other | Admitting: Cardiology

## 2011-02-27 VITALS — BP 185/94 | HR 76 | Ht 61.0 in | Wt 124.0 lb

## 2011-02-27 DIAGNOSIS — I119 Hypertensive heart disease without heart failure: Secondary | ICD-10-CM

## 2011-02-27 DIAGNOSIS — I2510 Atherosclerotic heart disease of native coronary artery without angina pectoris: Secondary | ICD-10-CM

## 2011-02-27 DIAGNOSIS — I70219 Atherosclerosis of native arteries of extremities with intermittent claudication, unspecified extremity: Secondary | ICD-10-CM

## 2011-02-27 DIAGNOSIS — R000 Tachycardia, unspecified: Secondary | ICD-10-CM

## 2011-02-27 DIAGNOSIS — I10 Essential (primary) hypertension: Secondary | ICD-10-CM

## 2011-02-27 DIAGNOSIS — R0602 Shortness of breath: Secondary | ICD-10-CM

## 2011-02-27 DIAGNOSIS — I779 Disorder of arteries and arterioles, unspecified: Secondary | ICD-10-CM | POA: Insufficient documentation

## 2011-02-27 DIAGNOSIS — R079 Chest pain, unspecified: Secondary | ICD-10-CM

## 2011-02-27 NOTE — Patient Instructions (Signed)
Lexiscan stress test  Your physician wants you to follow up in: 6 months.  You will receive a reminder letter in the mail one-two months in advance.  If you don't receive a letter, please call our office to schedule the follow up appointment

## 2011-02-27 NOTE — Assessment & Plan Note (Signed)
No further work-up required 

## 2011-02-27 NOTE — Assessment & Plan Note (Signed)
The patient is on good blood pressure regimen. Overall her blood pressures have been relatively controlled. Her blood pressure is more than 170 mmHg she takes a half a dose of clonidine which normalizes her blood pressure.

## 2011-02-27 NOTE — Assessment & Plan Note (Signed)
Patient had increased symptoms of shortness of breath. Will order Lexi scan. Last ischemia evaluation was 5 years ago. Her last catheterization was in the 90s.

## 2011-02-27 NOTE — Progress Notes (Signed)
HPI The patient is a 75 year old female with a history of aortic penetrating ulcer requiring endograft repair September 2009. The patient has moderate coronary artery disease. She has unilateral renal artery stenosis and in the past for difficult to control hypertension. She also has significant stenosis of the right iliofemoral limb of her endograft causing exertional claudication. Her last visit in May of 2011 however she reported that her claudication symptoms have much improved. She continues to walk on a regular basis. In addition the patient has peripheral vascular disease including right carotid endarterectomy. She typically follows up with her primary care physician regarding this. She has a known left soft carotid bruit. Last lipid and liver function tests were done proximally year ago. They were within normal limits. The patient states that her blood pressure still runs high at times. This occurs when she particularly under stress. However she cannot tolerate higher doses of medications. When the patient noticed that her blood pressures greater than 180 she will take a half a dose of clonidine which then normalized her blood pressure and she treats herself when necessary. She had recent ABIs done and unfortunately it appears that stenosis of the right iliofemoral limb of her endograft has worsened. Her ABI went from 0.9 to 0.5. She does report quite severe claudication on minimal exertion however her limb does not appear to be patent. Although I cannot feel any pulses are limb still feels warm and there is no ulcers or evidence of gangrene or discoloration. She has an appointment scheduled with Dr. early to followup.  The patient also reports episodes of weak spells and shortness of breath on exertion. She denies any chest pain.  Allergies  Allergen Reactions  . Cefixime     REACTION: short of breath  . Codeine     REACTION: dizziness  . Conjugated Estrogens     REACTION: flushing  .  Iohexol      Desc: pt has anxiety and an irregular heartbeat. contrast exacerbates this. pt was very uncomfortable after a 20cc bolus for an miroi. we did w/o iv 11/09 per dr. Eppie Gibson. pt will talk w/ md about this and will probably not get iv contrast in the future at her re, Onset Date: [id number]     Current Outpatient Prescriptions on File Prior to Visit  Medication Sig Dispense Refill  . aspirin 81 MG tablet Take 81 mg by mouth daily.        Marland Kitchen atorvastatin (LIPITOR) 40 MG tablet Take 20 mg by mouth at bedtime.       . calcium-vitamin D (OSCAL WITH D) 500-200 MG-UNIT per tablet Take 1 tablet by mouth 2 (two) times daily.        . clonazePAM (KLONOPIN) 0.5 MG tablet 1/4 tab by mouth at bedtime      . docusate sodium (COLACE) 100 MG capsule Take 100 mg by mouth 2 (two) times daily.        Marland Kitchen ibuprofen (ADVIL,MOTRIN) 200 MG tablet Take 200 mg by mouth every 6 (six) hours as needed.        . labetalol (NORMODYNE) 200 MG tablet Take 1/2 by mouth in morning and 1 by mouth at bedtime      . lisinopril (PRINIVIL,ZESTRIL) 20 MG tablet Take 20 mg by mouth daily.        Marland Kitchen omeprazole (PRILOSEC) 20 MG capsule Take 20 mg by mouth daily.       . polyethylene glycol (MIRALAX / GLYCOLAX) packet Take 17 g by mouth  every other day.       Marland Kitchen DISCONTD: Multiple Vitamins-Minerals (ICAPS MV PO) Take 1 capsule by mouth 2 (two) times daily.          Past Medical History  Diagnosis Date  . Hypertension     poorly controlled  . Arrhythmia     tachypalpitations cardiac moniter time 2 evaluated by Dr.Kline in june 2008 atrial tachycardia ruled out,adrenaline or excess adrenergic sensitivity ruled out  . Normal nuclear stress test     10/2007staus post cardiac cath in the mid 90s nonobstructive coronary artery disease  . Diabetes mellitus     per patient diet control  . History of migraine headaches     chronic  . Macular degeneration   . Left lower quadrant pain     not resolved post stenting aortic penetrating  ulcer distal aortic pentrating ulcer treated with endograft 07/2008  . Anxiety disorder     probable somatic anxiety disorder  . Diverticulosis     left sided noted on colonoscopy 02/2006  . Chronic constipation     secondary to verapamil but present priot to use  . Esophageal reflux disease   . Allergy to ACE inhibitors     questionable cough on ace inhibitors currently tolerating  . Chronic low back pain     lumbosacral spine changes were noted in MRI scan with left radiculopathy 12/2004  . History of tonsillectomy     Past Surgical History  Procedure Date  . Carotid endarterectomy   . Right trigeminal neuralgia   . Atherosclerotic ulcer     penetrating atherosclerotic elcer infrarenal aorta  . Endologix powerlink bifurcated      covered stent graft  . Abdominal hysterectomy     Family History  Problem Relation Age of Onset  . Heart failure Mother   . Coronary artery disease Mother   . Heart attack Father     History   Social History  . Marital Status: Married    Spouse Name: N/A    Number of Children: N/A  . Years of Education: N/A   Occupational History  . 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 Smoking status: Former Smoker    Quit date: 11/12/1986  . Smokeless tobacco: Not on file   Comment: smoked for 1 year  . Alcohol Use: No  . Drug Use: No  . Sexually Active: Not on file   Other Topics Concern  . Not on file   Social History Narrative  . No narrative on file   Review of systems:Pertinent positives as outlined above. The remainder of the 18  point review of systems is negative   PHYSICAL EXAM BP 185/94  Pulse 76  Ht 5\' 1"  (1.549 m)  Wt 124 lb (56.246 kg)  BMI 23.43 kg/m2  General: Well-developed, well-nourished in no distress Head: Normocephalic and atraumatic Eyes:PERRLA/EOMI intact, conjunctiva and lids normal Ears: No deformity or lesions Mouth:normal dentition, normal posterior pharynx Neck: Supple, no JVD.   No masses, thyromegaly or abnormal cervical nodes, soft bilateral carotid bruits, well-healed left endarterectomy scar Lungs: Normal breath sounds bilaterally without wheezing.  Normal percussion Cardiac: regular rate and rhythm with normal S1 and S2, no S3 or S4.  PMI is normal.  No pathological murmurs Abdomen: Normal bowel sounds, abdomen is soft and nontender without masses, organomegaly or hernias noted.  No hepatosplenomegaly MSK: Back normal, normal gait muscle strength and tone normal Vascular: Absent pulses dorsalis  pedis and posterior tibial pulses, right side palpable on the left side   Extremities: No peripheral pitting edema Neurologic: Alert and oriented x 3 Skin: Intact without lesions or rashes Lymphatics: No significant adenopathy Psychologic: Normal affect  ECG:  ASSESSMENT AND PLAN

## 2011-02-27 NOTE — Assessment & Plan Note (Addendum)
ABIs have significantly worsened. ABI change from 0.9 to 0.5 on the right side.Endograft repair penetrating ulcer aorta 2009. Worsening limb ischemia in the right leg secondary to stenosis of the right iliofemoral limb of the endograft. Patient has an appointment scheduled with Dr. early. Currently there is no evidence of significant critical limb ischemia

## 2011-03-02 DIAGNOSIS — I10 Essential (primary) hypertension: Secondary | ICD-10-CM

## 2011-03-02 DIAGNOSIS — R079 Chest pain, unspecified: Secondary | ICD-10-CM

## 2011-03-05 DIAGNOSIS — R0602 Shortness of breath: Secondary | ICD-10-CM

## 2011-03-27 NOTE — Discharge Summary (Signed)
NAMEKILLIAN, RESS                ACCOUNT NO.:  [id number]   MEDICAL RECORD NO.:  [id number]          PATIENT TYPE:  INP   LOCATION:  3729                         FACILITY:  [facility]   PHYSICIAN:  Maple Mirza, PA   DATE OF BIRTH:  1936/06/18   DATE OF ADMISSION:  12/20/2007  DATE OF DISCHARGE:  12/23/2007                               DISCHARGE SUMMARY   ALLERGIES:  1. ERYTHROMYCIN.  2. CEFOTAXIME.  3. BETA-BLOCKERS WHICH CAUSED HEADACHE.  4. I THINK CALAN.   FINAL DIAGNOSES:  1. Admitted with recurrent tachy palpitations.      a.     Refractory to treatment with p.r.n. verapamil causing this       admission.      b.     troponin I studies were all normal.      c.     Beta-blocker exacerbates headaches.  2. Computed tomography of the chest negative for pulmonary embolism.  3. P wave similar to sinus rhythm in her tachycardia.  It is felt that      the arrhythmia is driven by catecholamines (possibly a      inappropriate sinus tachycardia).      a.     Patient offered a second electrophysiology evaluation by Dr.       Graciela Husbands (they will think about this).      b.     Pheochromocytoma workup is pending at the time of this       discharge.  4. Gastrointestinal workup this admission was negative.  Gallbladder      ultrasound negative.      a.     Outpatient followup with Dr. Madilyn Fireman they have his card.   SECONDARY DIAGNOSES:  1. Extracranial cerebrovascular occlusive disease status post left      carotid endarterectomy.  2. Normal stress test with ejection fraction of 63%, October 2007.  3. Hypertension.  4. Dyslipidemia.  5. Type 2 diabetes.  6. 2-D echocardiogram, August 2008, ejection fraction 55% to 60%.  7. Migrainous chronic headache.  8. Macular degeneration.  9. Anxiety.  10.Trigeminal neuralgia.  11.Status post hysterectomy.  12.Status post tonsillectomy.   CONSULT THIS ADMISSION:  Neurology.  The patient was seen by Neurology  for facial pain which is triggered  by cool air and not limited by a  trigeminal distribution.  Neurology summation:  Nothing to offer  either, not tried or rejected.  They suggested the following abortive  medications:  1. Triptans-relatively contraindicated.  2. NSAIDs-refuses  being too harsh.  3. Beta-blocker.  The patient had paradoxic reaction  exacerbating headaches. 4. Topamax-possible cognitive changes.  5. No  Depakote with the possibility of liver problems.  6. Tricyclic  medications increase heart rate.  The patient has outpatient evaluation  by Dr. Anne Hahn.   BRIEF HISTORY:  Ms. Standley is a 75 year old female who has a history of  tachy palpitations and is being treated with verapamil.  She takes  verapamil on a rescue basis as needed.  She is followed at the [facility] office at [facility] on February 3 and  4th with recurrent  refractory tachy palpitations.  Verapamil did not work to slow them  down.  The patient had been started on low-dose beta-blocker of 75 mg  daily.  In addition a 24-hour urine for catecholamines and metanephrines  was obtained.  The patient has followup with Dr. Andee Lineman in 2-to-3 weeks.   Basically details of her hospital visit here at [facility] as  mentioned from 3 branches of medicine:   1. Electrophysiology representing Cardiology with nothing really to      add except to look forward to the pheochromocytoma workup, except a      referral to a secondary electrophysiologist.  2. Also from Gastroenterology, a negative workup including negative      gallbladder ultrasound but with outpatient followup with Dr. Madilyn Fireman.  3. From neurology abortive meds for her chronic nagging headache were      suggested but all rejected for either side effects or potential      side effects, but the patient has an outpatient evaluation with Dr.      Anne Hahn.      Maple Mirza, PA     GM/MEDQ  D:  12/30/2007  T:  12/31/2007  Job:  [id number]   cc:   Antonietta Breach, M.D.  Duke Salvia, MD, [facility]  Learta Codding, MD,FACC  Dr. Eliberto Ivory

## 2011-03-27 NOTE — Assessment & Plan Note (Signed)
[facility]                          [facility] OFFICE NOTE   NAME:[name]                       MRN:          [id number]  DATE:12/31/2008                            DOB:          11-30-1935    REFERRING PHYSICIAN:  Doreen Beam, MD   HISTORY OF PRESENT ILLNESS:  This patient is a 75 year old female with  difficult to control blood pressure.  The patient also at times  experienced palpitations.  She stated that her blood pressure has been  running normal at home, but in our office here they are quite high,  actually I measured them 200/100 mmHg.  She also has by CT scan of the  neck evidence of significant left subclavian stenosis, although I was  unable to measure a significant difference between both arms, there is  only approximate 20 mm of difference.  The patient states at time she  gets still dizzy.  It appears to be related to elevations in her blood  pressure.  I can also not exclude if she has symptomatic subclavian  steal syndrome.   MEDICATIONS:  Are listed in the chart include;  1. Clonidine 0.1 nightly.  2. Lisinopril and hydrochlorothiazide 20/25 mg p.o. daily half-tablet.  3. Clonazepam 0.5 mg p.o. daily.  4. Aspirin 81 mg a day.  5. Omeprazole 20 mg a day.  6. Verapamil 240 mg a day.  7. Colace 100 mg p.o. b.i.d.  8. Lipitor 40 mg p.o. daily.  9. Multivitamin b.i.d.  10.Atenolol 25 mg half tablet p.o. daily.  11.MiraLax 17 g p.o. daily   PHYSICAL EXAMINATION:  VITAL SIGNS:  Blood pressure is 170/90; but on  repeat 200/100; heart rate 65; respirations 16; and weight is 130  pounds.  NECK:  Normal carotid upstroke.  No carotid bruits.  LUNGS:  Clear breath sounds bilaterally.  HEART:  Regular rate and rhythm.  Normal S1 and S2.  No murmur, rubs, or  gallops.  ABDOMEN:  Soft and nontender.  No rebound or guarding.  Good bowel  sounds.  EXTREMITIES:  No cyanosis, clubbing, or edema.   PROBLEM LIST:  1. Hypertensive  heart disease.  2. Tachycardia.  3. Peripheral vascular disease.  4. Generalized anxiety disorder.  5. Chronic tension-type headache.   PLAN:  1. I suspect the patient's blood pressure elevation may well be      causing some of her problems with headache.  I do not think her      blood pressure cuff is accurate and I asked her to come to the      office to measure her blood pressure cuff.  I increased her      lisinopril/hydrochlorothiazide to full tablet a day as well as      clonidine to 0.1 mg p.o. b.i.d.  2. The patient will follow up with me in the next couple of weeks.     Learta Codding, MD,FACC     GED/MedQ  DD: 12/31/2008  DT: 01/01/2009  Job #: [id number]   cc:   Doreen Beam, MD

## 2011-03-27 NOTE — Consult Note (Signed)
[name], [name]                ACCOUNT NO.:  [id number]   MEDICAL RECORD NO.:  [id number]          PATIENT TYPE:  INP   LOCATION:  3729                         FACILITY:  [facility]   PHYSICIAN:  Deanna Artis. Hickling, M.D.DATE OF BIRTH:  February 06, 1936   DATE OF CONSULTATION:  12/20/2007  DATE OF DISCHARGE:                                 CONSULTATION   CHIEF COMPLAINT:  Chronic daily headaches.   HISTORY OF PRESENT ILLNESS:  I was asked by Dr. Dietrich Pates to see [name], a 75 year old woman previously seen at [facility] by my partner, Dr. Lesia Sago.  I do not have any of Dr.  Anne Hahn' notes concerning his visits but the patient has apparently had  headaches for 8-10 years.  These are chronic daily headaches that are  prominent at the vertex region of her head and associated with pounding  pain.  She has some sensitivity to light related to her macular  degeneration but not her headaches and does not claim sensitivity to  sound or movement.  She says that if she talks to somebody for a long  time, the headache may intensify.  She does not have any nausea or  vomiting with the headaches and has not had nausea until this  hospitalization when she had nausea plus palpitations.   Patient has mild chronic neck pain that began when she was in a ride at  [facility], [place], a number of years ago.  To this day, she  will prop a pillow under her neck which gives her comfort.   The patient has not had any closed head injuries or other nervous system  infections.   REVIEW OF SYSTEMS:  CONSTITUTIONAL:  The patient had elevated  temperature a week ago, (101.3).  She had headache that was somewhat  worse than her baseline and has nasal discharge which she thought was  related to a sinusitis that has improved.  She also had some bloody  phlegm.   The patient's main cardiovascular problem is palpitations.  Main GI  complaint was nausea without vomiting.   PAST  MEDICAL HISTORY:  Positive for  1. Hypertension.  2. Diet controlled diabetes mellitus.  3. Left carotid vessel disease treated with endarterectomy.  4. Palpitations that were improved with metoprolol but at the cost of      causing increase headache which is a paradoxical reaction.  5. Macular degeneration.  6. A history of trigeminal neuralgia that on further examination      seems to be not limited to the trigeminal area and to be somewhat      atypical in its presentation.   PAST SURGICAL HISTORY:  1. Hysterectomy.  2. Tonsillectomy.  3. Appendectomy.   FAMILY HISTORY:  Mother died of cancer at age 40.  She had myocardial  infarctions in her 25s.  Father died of heart attack at age 53.  She is  the youngest of nine.  There are three siblings left.   SOCIAL HISTORY:  The patient lives in [place], [place], with her  husband.  She is married.  She has one daughter.  She smoked tobacco for  a year when she was in her 105s.  She does not drink alcohol, use drugs  or herbs.  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  She  said it was a very stressful job.   CURRENT MEDICATIONS:  1. Verapamil 180 mg a day (discontinued).  2. Zestoretic 20/25 one half tablet daily.  3. Lipitor 40 mg daily.  4. Xanax 0.25 mg one half to one tablet three times a day as needed.  5. Aspirin 81 mg daily.  6. Prilosec 20 mg daily.  7. Colace 100 mg daily.  8. Zithromax 750 mg until December 20, 2007, due to sinus infection.  9. Astelin nose spray.   ALLERGIES AND INTOLERANCES:  CODEINE, ESTROGEN, CEFOTAXIME and BETA-  BLOCKERS, the latter of which cause headaches.   PHYSICAL EXAMINATION:  GENERAL APPEARANCE:  Well-developed, nourished  woman in no acute distress, right-handed.  VITAL SIGNS:  Temperature 99.6, blood pressure 153/75, resting pulse  124, respirations 20, capillary glucose has ranged from 154-195, oxygen  saturation 99% on room air.  Weight 58.9 kg, height 62.4  inches.  HEAD, EYES, EARS, NOSE AND THROAT:  No signs of infection.  Supple neck,  full range of motion.  No cranial or cervical bruits.  No localized  tenderness in the head and neck region.  The patient did not have  limitation of range of motion in the head and neck.  She has bilateral  carotid bruits that are soft.  LUNGS:  Clear to auscultation.  HEART:  No murmurs.  Pulses normal.  ABDOMEN:  Soft, nontender.  Bowel sounds normal.  EXTREMITIES:  Well-formed without edema, cyanosis, alterations in tone  or tight heel cords.  SKIN:  No lesions.  VASCULAR:  Tone was normal.  NEUROLOGIC:  Mental status:  The patient is awake, alert, attentive and  appropriate, no dysphasia, dyspraxia, names objects, follows commands.  Conveys thoughts and feelings.  Cranial nerves:  Round reactive pupils.  Normal fundi.  Full visual fields to double simultaneous stimuli.  Extraocular movements full and conjugate.  The patient has scarring in  her macular regions.  As soon as I asked her look at the light so that I  could look at her macular regions, she turned very quickly away so I  never could see the scarring.  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   Motor examination:  Normal strength, tone and mass.  Good fine motor  movements.  No pronator drift.  Sensation intact to cold, vibration and  stereognosis.  She has a mild peripheral stocking neuropathy.  Gait was  not tested.  Reflexes were absent.  The patient had bilateral flexor  plantar responses.   IMPRESSION:  1. Chronic daily headache disorder, migrainous without many of the      elements of migraines. (784.0)  2. Cervical spondylosis which is not likely to cause her headaches.  3. Atypical face pain triggered by blowing cool air on her face.  This      is not clearly limited to the trigeminal distribution and I am not      certain that it represents trigeminal neuralgia.   PLAN:  I discussed abortive medications.  Triptans are relatively   contraindicated for her.  She will not use NSAIDs because they are too  harsh.  I discussed preventative medications.  Beta-blockers  paradoxically make her headaches worse.  Verapamil initially helped.  I  do not  know if Cardizem was ever tried.  She does not want to use  Topamax because of possible cognitive changes or Depakote because of  potential problems with her liver (she is on Lipitor).  She says that  tricyclics cause increased heart rate.   Though patient has chronic headaches, she is able sleep well.  She  awakens with mild or no headache and is not incapacitated, nauseated or  showing significant sensitivity to environmental stimuli.  Patient has  had relatively recent imaging of her brain which showed mild  unidentified bright objects, but no other abnormalities.  After thorough  discussion with this lady, I feel that I have nothing to offer that she  has not either tried or rejected.  She is followed by Dr. Lesia Sago.  I have invited her to return to the office for outpatient evaluation of  her headaches.  If you have any questions about this or I can be of  assistance, do not hesitate to contact me.      Deanna Artis. Sharene Skeans, M.D.  Electronically Signed     [facility]/MEDQ  D:  12/20/2007  T:  12/22/2007  Job:  [id number]

## 2011-03-27 NOTE — Procedures (Signed)
CAROTID DUPLEX EXAM   INDICATION:  Followup evaluation of known carotid artery disease, bruit.   HISTORY:  Diabetes:  Borderline.  Cardiac:  No.  Hypertension:  Yes.  Smoking:  Quit in 1981.  Previous Surgery:  Left carotid endarterectomy in 1993 by Dr. Sheron Nightingale.  CV History:  Bruit.  Amaurosis Fugax No, Paresthesias No, Hemiparesis No                                       RIGHT             LEFT  Brachial systolic pressure:         210               216  Brachial Doppler waveforms:         Triphasic         Triphasic  Vertebral direction of flow:        Antegrade         Antegrade  DUPLEX VELOCITIES (cm/sec)  CCA peak systolic                   74                71  ECA peak systolic                   65                54  ICA peak systolic                   65                60  ICA end diastolic                   18                17  PLAQUE MORPHOLOGY:                  Calcified         Soft  PLAQUE AMOUNT:                      Mild              Minimal  PLAQUE LOCATION:                    Proximal ICA      Proximal ICA   IMPRESSION:  1. 20-39% right ICA stenosis.  2. No left ICA stenosis status post endarterectomy.   ___________________________________________  P. Liliane Bade, M.D.   MC/MEDQ  D:  08/05/2008  T:  08/06/2008  Job:  [id number]

## 2011-03-27 NOTE — Letter (Signed)
May 07, 2007    Learta Codding, M.D.,FACC  [address]  [place], [place] [address]   RE:  [name], [name]  MRN:  [id number]  /  DOB:  05-30-1936   Dear [name]:   It was a pleasure to see [name] today, although, as outlined  below, very little data accompanied her.   As you know, she is a 75 year old woman with a history of chronic  headaches, diet-controlled diabetes, who has palpitations that go back a  couple of years.  She was initially followed by Dr. Weyman Pedro, and  you saw her in consultation initially in September of 2007 in the  hospital.  She had been coming back from [facility].  She started  feeling her heart racing.  She showed up in Dr. Magnus Ivan office and an  electrocardiogram was obtained, demonstrating a heart rate of 104 and  it suddenly jumped up to 178 beats per minute.  Your comment was that  there might be a slight change in the P wave morphology.  She apparently  had a recurrence in the hospital that you again felt was consistent with  an ectopic atrial arrhythmia.  She has been treated with Toprol, but  this was switched to a calcium blocker because of concern that it might  be aggravating her headaches.   Because of further symptoms in the fall, you increased her verapamil.  A  CardioNet monitor had shown 2 episodes of tachycardia (see below).   Her cardiac evaluation included a Myoview scan that was negative.  I do  not know if an ultrasound was done.   Her related history is notable for significant hypertension.  In fact,  the patient describes these episodes as being accompanied by severe  hypertension.  For example, 2 weeks ago she was re-admitted to the  hospital because of fast heart rates.  She thinks this was different  from other fast heart rates that she has had, but upon arrival to  [facility] emergency room, her blood pressure systolic was over 190.  She  had another episode in the hospital.  She may have been seen by the  fellow on  Sunday.  While in the hospital, her tachycardia was again  accompanied by severe hypertension.  The husband recalls heart rates in  the low 100s, although the tapping heart rate that she describes prior  to going to the hospital was about 170 to 180.   It is not clear to me whether she has had evaluation for  pheochromocytoma or not.  She does have a history of vascular disease.  She is status post endarterectomy.  Her other risk factors for heart  disease include the hypertension, diabetes, which is diet-controlled.   PAST MEDICAL HISTORY:  1. In addition to the above, is notable for a long-standing headache,      which had been chronic for a couple of years.  2. Macular degeneration.  3. Trigeminal neuralgia.   PAST SURGICAL HISTORY:  Notable for a hysterectomy, endarterectomy, disk  surgery.   CURRENT MEDICATIONS:  1. Xanax taken 3 times a day.  2. Zestoretic.  3. Verapamil 180 and 40 p.r.n.  4. Aspirin.  5. Lipitor.  6. Prilosec.  7. Colace.   She does not take codeine, but she has no drug allergies.   SOCIAL HISTORY:  Francis Dowse is married.  She does not smoke.  She does not use  alcohol or recreational drugs.  SHE denies depression.  EXAMINATION:  She is an elderly Caucasian female appearing older than  her stated age of 62.  Her blood pressure was 220/130 initially.  Recheck manually was 180/106.  HEENT:  No icterus or xanthomata.  NECK:  Veins are flat.  Carotids are brisk and full bilaterally without  bruits.  BACK:  Without kyphosis or scoliosis.  LUNGS:  Clear.  HEART:  Sounds were regular with an S4 and early systolic murmur.  ABDOMEN:  Soft with active bowel sounds without midline pulsation of  hepatomegaly.  Femoral pulses were 2+.  Distal pulses were intact.  There is no cyanosis, clubbing, or edema.  NEUROLOGIC:  Grossly normal.  SKIN:  Warm and dry.   Review of the data that we do have includes a CardioNet monitor that  showed a rapid heart rate, but the  onset of it was rather gradual, as  was its offset.  Electrocardiogram dated today demonstrates sinus rhythm  at 75 with an interval of 0.16/0.10/0.38.  The axis was 60 degrees.  There was minor ST segment flattening in leads II, III, and V6.   Electrocardiograms from the September 2007 visit to Dr. Magnus Ivan office  are pending at this time.   IMPRESSION:  1. Palpitations.  2. Severe hypertension accompanying #1, also associated with      significant background hypertension.  3. Normal left ventricular function and nonischemic Myoview.  4. Peripheral vascular disease status post endarterectomy.  5. Borderline diabetes.   [name], I am not clear as to what is [name]' problem.  The CardioNet  monitor that we have that shows a gradual onset and offset of this  tachycardia would suggest that this is a secondary phenomenon either to  perhaps excess adrenaline or excess adrenergic sensitivity.  I do not  know if she has had a pheochromocytoma workup or not, but certainly in  the context of her admission to [facility] the other day, that would have  been a good time to have done it, as she was acutely symptomatic.  It  remains possible that she has a tachycardia __________ and that the  adrenaline response resulting in hypertension is secondary as well.  The  electrocardiograms may help in this.  The abrupt increase referred to  in Dr. Magnus Ivan note is notable.  The episode that she had the other day  would also be potentially illuminating in this regard.   Unfortunately, though, I did not have much to tell the family except  that I did not have much to tell them.  I told them that I would follow  up with them after we got some of the records from this most recent  hospitalization, as well as the electrocardiograms from Dr. Magnus Ivan  office.   Thanks for allowing [place] to participate.    Sincerely,     Duke Salvia, MD, [facility]  Electronically Signed    SCK/MedQ  DD: 05/07/2007   DT: 05/07/2007  Job #: [id number]   CC:    [facility]

## 2011-03-27 NOTE — H&P (Signed)
[facility] ADMISSION   NAME:[name], [name]                         MRN:          [id number]  DATE:12/20/2007                            DOB:          1936/02/07    PRIMARY CARDIOLOGIST:  Dr. Lewayne Bunting.   CHIEF COMPLAINT:  Heart palpitations.   HISTORY OF PRESENT ILLNESS:  [name] is a 75 year old woman with a  history of hypertension, diet-controlled diabetes, and recurrent tachy  palpitations who was awakened on a.m. of admission by her heart racing.  As instructed by Dr. Andee Lineman, she took a 40 mg tab of fasting-acting  Verapamil which did not offer much relief of her symptoms (her heart  rate slowed down for only about 20 minutes).  The patient felt nauseated  and noticed that her legs were weak when she stood up to use the  restroom.  She denied any chest pain, dyspnea, dizziness, syncope, or  falls.  Given that her symptoms were persisting, her husband and her  decided to call EMS.  The patient explained that she has been having  palpitations for several years, but even more so since 2007 at which  point she was seen by Dr. Andee Lineman.  Of note, [name] was seen at  [facility] on February 4 and kept overnight secondary to  palpitations.  At that time, the 40 mg of Verapamil had not helped much  either.  The patient reports that her heart rate was 163 on admission.  She relates that Dr. Andee Lineman saw her at [facility], and that her ordered a  24-hour urine collect for a possible tumor.  She received one dose of  Toprol XL while at [facility] and it gave her a significant headache, so  she was not sent home on a beta blocker.   PAST MEDICAL HISTORY:  1. Essential hypertension, white-coat hypertension component      documented.  2. Recurrent tachy palpitations.  3. CardioNet monitor on October 2007 showed possible ectopic atrial      tachycardia.  4. Negative Cardiolite, October 2007.  5. Evaluated by  Dr. Berton Mount in June 2008, atrial tachycardia ruled      out, adrenaline or excess adrenergic sensitivity suspected.  6. Diabetes mellitus, diet controlled.  7. Peripheral vascular disease, status post left carotid      endarterectomy.  8. Chronic migraine headache.  9. Macular degeneration.  10.Trigeminal neuralgia.  11.Hyperlipidemia.  12.Anxiety disorder not otherwise specified.  13.Status post hysterectomy.  14.Status post tonsillectomy.   ALLERGIES:  CODEINE, ESTROGEN, and CEFIXIME.   HOME MEDICATIONS:  1. Verapamil extended release 180 mg p.o. daily.  2. Verapamil 80 mg p.o. p.r.n. for palpitations.  3. Zestoretic 20/25 one-half tab p.o. daily.  4. Lipitor 40 mg p.o. nightly.  5. Xanax 0.25 mg 1/2 to 1 tab p.o. t.i.d. p.r.n.  6. Aspirin 81 mg p.o. daily.  7. Prilosec 20 mg p.o. daily.  8. Colace 100 mg p.o. daily.  9. Zithromax 250 mg (Z-Pak started a few days ago).   SOCIAL HISTORY:  [name] lives  in [place] with her husband.  She is  retired and used to work at [employer].  They have 1 daughter.  She  smoked for 1 year and quit in 1981.  Denies any alcohol, drug use, and  does not take any over the counter or natural products.   FAMILY HISTORY:  Father died of an MI at 22.  Mother died at 35 of  cancer (unknown primary), she had an MI in her 41s.   REVIEW OF SYSTEMS:  The patient describes a sinus infection with nasal  discharge, stuffiness and fever up to 101.3 about 1 week ago with  minimal cough that has since improved.  She also endorses anxiety and  vomited x1 while in the emergency department.   PHYSICAL EXAMINATION:  Temperature 99.1, blood pressure 194/106,  respiratory rate 20, heart rate 105, O2 sat 100% on room air.  GENERAL:  No acute distress.  Younger appearing than stated age.  HEENT:  PERRLA, EOMI, anicteric sclerae.  Oropharynx clear.  Dentures.  NECK:  Left endarterectomy scar.  Neck supple, no lymphadenopathy,  thyromegaly, carotid bruit  or JVD.  CARDIOVASCULAR:  Regular rate and rhythm, normal S1 and S2.  No S3, S4  murmurs or rubs.  Pulses 2+ in periphery.  LUNGS:  Clear to auscultation bilaterally with good air movement.  ABDOMEN:  Bowel sounds positive, soft, nontender, nondistended, no  palpable masses or organomegaly.  EXTREMITIES:  No edema, cyanosis or clubbing.  NEURO:  Grossly nonfocal.   Radiology:  Chest x-ray showed minimum linear scarring or atelectasis in  the left lower lobe.   EKG:  Sinus tachycardia with a rate of 106 beats per minute.  Intervals  normal, no ST or T-ischemic change, no hypertrophy.  Multiple PVCs  noted.   LABS:  Sodium 131, potassium 3.9, chloride 99, bicarb 22, BUN 14,  creatinine 0.8, glucose 188.  White blood count 8.6, ANC 7.8, hemoglobin  12.8, MCV 86, platelets 284.  PTT 34, PT 12.4, INR 0.9.  Urinalysis was  within normal limits.  Point of care markers negative x3.   ASSESSMENT AND PLAN:  1. Recurrent tachy palpitations:  We were able to review [name]      outpatient cardiology notes, and also reviewed her hospitalization      records from February 4 at [facility].  It seems unclear      whether she actually does have supraventricular tachycardia, and      whether this was ever documented in the past.  Dr. Andee Lineman has      ordered a 24-hour urine for catecholamines and metanephrines to      rule out pheochromocytoma.  Although pheochromocytoma is a rare      entity, [name]' recurrent palpitations, hypertension, and      headache make this diagnosis a possibility.  We are planning to      admit her to a telemetry bed for observation.  We have decided to      discontinue the verapamil, and to start her on a beta blocker      instead.  Since she did not tolerate the Toprol, we have chosen to      use 200 mg of labetalol t.i.d. to see if she tolerates that agent      better.  A 2D echo will not be ordered since she already had one      done in August of 2008,  and it was normal.  We will need to  follow      up on her urine study which is being done at [facility].  We      will discuss with Dr. Andee Lineman and see if he has any further advice,      and will likely ask for electrophysiology's help again.  2. Hypertensive crisis without hypertensive emergency:  The patient's      blood pressure has been under control ever since she received      intravenous labetalol in the ambulance.  She has not signs of end-      organ damage.  We will continue her Zestoretic, and are starting      the beta blocker.  3. Hyperlipidemia:  Will continue Lipitor 40 mg p.o. nightly.  4. Diabetes mellitus:  Patient's diabetes is diet controlled.  She is      not on any treatment for it.  Will not check CBGs or administer      insulin at this time.  5. Peripheral vascular disease, status post carotid endarterectomy:      No claudication.  Continue aspirin 81 mg p.o. daily.  6. Chronic migraine headache:  Will continue Tylenol p.r.n. for pain      and will ask for neurology's advice concerning the treatment of her      headaches.  This can be done on an outpatient basis.  7. Anxiety disorder:  Not otherwise specified.  Will continue low-dose      Xanax.  We suspect that [name]' palpitations and hypertension      are exacerbated by her anxiety.  She would probably benefit from an      selective serotonin reuptake inhibitor, but we will leave this to      be managed by her primary care physician.      Olene Craven, M.D.      Gerrit Friends. Dietrich Pates, MD, [facility]  Electronically Signed   MC/MedQ  DD: 12/20/2007  DT: 12/22/2007  Job #: [id number]   cc:   Learta Codding, MD,FACC  Duke Salvia, MD, [facility]

## 2011-03-27 NOTE — Assessment & Plan Note (Signed)
[facility]                          [facility] OFFICE NOTE   NAME:[name], [name]                         MRN:          [id number]  DATE:01/06/2008                            DOB:          1936/09/20    HISTORY OF PRESENT ILLNESS:  The patient is a pleasant female with a  history of recurrent palpitations, previously documented PVCs.  There is  also question whether this patient could have an ectopic atrial  tachycardia with P-wave morphology arrhythmias resembling sinus node  activity.  The patient was recently seen at [facility] with  palpitations when an attempt was made to use beta blockers, it caused  however headaches.  Several days later, the patient was admitted to  [facility] with some symptoms of palpitations that occurred at  night. In the meanwhile, she already had a 24-hour urine test for  metanephrines done which was within normal limits.  She noted that her  blood pressure was very high the night of admission.  She saw Dr. Graciela Husbands  in consultation with no further recommendations were given.  She also  saw neurology and GI because she had symptoms of nausea and vomiting.  The patient states that she is doing better from perspective nausea and  vomiting and had an abdominal ultrasound done which was negative.  The  patient was given again beta-blocker, but could not tolerate this.  She  states that she still has an occasional palpitations which make her very  concerned and this causes anxiety with rising blood pressure.   MEDICATIONS:  1. Aspirin 81 mg a day.  2. Lisinopril/hydrochlorothiazide 20/12.5 mg a half tablet a day.  3. Omeprazole 20 mg p.o. daily.  4. Verapamil 240 mg p.o. daily.  5. Docusate sodium 100 mg p.o. b.i.d.  6. Lipitor 40 mg a day.  7. Alprazolam 2.5 mg a quarter of a tablet nightly.   PHYSICAL EXAMINATION:  VITAL SIGNS:  Blood pressure 184/90, heart rate  110, weight 130.8 pounds.  NECK: Normal  carotid upstroke. No definite carotid bruits. Status post  left carotid endarterectomy.  LUNGS:  Clear.  HEART: Regular rate and rhythm. Normal S1, S2. No murmur, rubs or  gallops.  ABDOMEN: Soft and nontender. No rebound or guarding. Good bowel sounds.  EXTREMITIES: No cyanosis, clubbing or edema.  NEURO: The patient is alert, oriented and grossly nonfocal.   PROBLEM LIST:  1. White coat hypertension.  2. Peripheral vascular status post carotid endarterectomy. Negative      Cardiolite imaging study.  3. Anxiety.  4. Palpitations, rule out PVCs versus ectopic atrial tachycardia.  5. Hypertension (paroxysmal) ruled out for pheochromocytoma, rule out      adrenal tumor versus renal artery stenosis.   PLAN:  1. The patient will have repeat monitor but will use a Life Watch      monitor this time.  2. The patient will have a serum aldosterone done and a plasma rhythm      activity and CT scan of the abdomen with contrast to rule out renal      artery stenosis.  3. We will consider antiarrhythmic drug therapy for this patient if      indeed she has ongoing symptoms of symptomatic PVCs, but I told her      that this certainly would not be first-line therapy.  4. I also gave the patient a low-dose prescription of atenolol which      she has tolerated in the past at 12.5 grams p.o. q. a.m. and has      changed her verapamil dosing to the evening.     Learta Codding, MD,FACC  Electronically Signed    GED/MedQ  DD: 01/06/2008  DT: 01/06/2008  Job #: [id number]

## 2011-03-27 NOTE — Consult Note (Signed)
VASCULAR SURGERY CONSULTATION   [name], [name]  DOB:  June 12, 1936                                       08/05/2008  EAVWU#:[id number]   REFERRING PHYSICIAN:  Veverly Fells. Excell Seltzer, MD   PRIMARY CARE PHYSICIAN:  Doreen Beam, MD.   REFERRAL DIAGNOSIS:  Penetrating atherosclerotic ulcer infrarenal aorta.   HISTORY:  The patient is a 75 year old female with a history of chronic  abdominal pain.  CT scan revealed an ulcerated plaque or penetrating  ulcer of the infrarenal abdominal aorta.  She also has a history of  accelerated and poorly controlled hypertension.   She was seen in consultation by Dr. Tonny Bollman and underwent an  abdominal aortogram.  This revealed a complex ulcerated plaque of the  infrarenal aorta.  She was noted to have mild right renal artery  stenosis and normal left renal artery anatomy.   PAST MEDICAL HISTORY:  1. Hypertension.  2. Chronic palpitations.  3. Hyperlipidemia.  4. Glucose intolerance.  5. Cerebrovascular disease status post left carotid endarterectomy in      1993.   MEDICATIONS:  1. Verapamil ER 240 mg daily.  2. Atenolol 25 mg 1/2 tablet daily.  3. Lisinopril/HCT 10/12.5 one and a half tablet daily.  4. Lipitor 40 mg daily.  5. Xanax 0.25 mg one half to 1 tablet t.i.d. p.r.n.  6. Aspirin 81 mg daily.  7. Prilosec 1 tablet daily.  8. ICaps 2 tablets daily.  9. Tylenol p.r.n.   ALLERGIES:  CODEINE.   FAMILY HISTORY:  Mother died age 21 with a history of coronary artery  disease and congestive heart failure.  Father died age 13 of an MI.  Three deceased brothers with histories of diabetes and coronary artery  disease.  Three deceased sisters with similar pathology.   SOCIAL HISTORY:  The patient is married with one child.  She is a  retired Solicitor at [employer].  No recent tobacco use.  Discontinued  tobacco in 1981.  No regular alcohol use.   REVIEW OF SYSTEMS:  Refer to the patient encounter form.  The patient  does  note occasional palpitations.  Reflux symptoms.  Chronic  constipation.  Headaches.   PHYSICAL EXAM:  General:  Alert, oriented 76 year old female.  No acute  distress.  Vital signs:  BP 214/124, pulse 86 per minute.  HEENT:  Unremarkable.  Neck:  Supple.  No thyromegaly or adenopathy.  Chest:  Equal entry bilaterally.  No rales or rhonchi.  Cardiovascular:  Left  carotid bruit.  Regular rate and rhythm.  Normal heart sounds without  murmurs.  No gallops or rubs.  Abdomen:  Soft, nontender.  No abdominal  bruits.  Normal bowel sounds.  No masses or organomegaly.  Extremities:  2+ femoral pulses bilaterally.  No femoral bruits.  1+ popliteal,  posterior tibial, dorsalis pedis pulse bilaterally.  No ankle edema.  Neurological:  Cranial nerves intact.  Strength equal bilaterally.  Reflexes normal.   IMPRESSION:  1. Penetrating atherosclerotic ulcer infrarenal abdominal aorta.  2. Hypertension.  3. Hyperlipidemia.  4. Glucose intolerance.  5. Remote history of left carotid endarterectomy.   RECOMMENDATIONS:  The patient has a deeply ulcerated atherosclerotic  ulcer of the infrarenal aorta.  This poses risk of potential  complications and I have recommended covered stent to treat this  problem.  She will be sized for  this with plans for elective placement.   The patient underwent carotid duplex scan at this time due to history of  left carotid endarterectomy and audible bruit.  This reveals no  significant left ICA stenosis status post endarterectomy and mild right  internal carotid artery stenosis.   Chronic medical conditions reviewed.  Hypertension remains poorly  controlled.   Balinda Quails, M.D.  Electronically Signed  PGH/MEDQ  D:  08/05/2008  T:  08/07/2008  Job:  [id number]   cc:   Doreen Beam, MD  Veverly Fells. Excell Seltzer, MD

## 2011-03-27 NOTE — Consult Note (Signed)
NAMEVICTORIYA, [name]                ACCOUNT NO.:  [id number]   MEDICAL RECORD NO.:  [id number]          PATIENT TYPE:  OBV   LOCATION:  3729                         FACILITY:  [facility]   PHYSICIAN:  John C. Madilyn Fireman, M.D.    DATE OF BIRTH:  12-21-1935   DATE OF CONSULTATION:  DATE OF DISCHARGE:                                 CONSULTATION   REASON FOR CONSULTATION:  Eructation.   HISTORY OF PRESENT ILLNESS:  This is a 75 year old female who reports  epigastric pain for the last several years.  She takes a Prilosec daily.  This past Saturday she developed right-sided back pain underneath her  cage, nausea and anorexia.  She vomited once.  She said it was yellow  fluid and she felt better after vomiting.  She has had no change in her  bowel habits.  No melena and no hematemesis.  She denies taking NSAIDs.  She was diagnosed with a hiatal hernia years ago when she had similar  epigastric pain.  She came to [facility] ER primarily for palpitations  and chronic headaches and has been admitted and evaluated for these  things.  She is a patient of Dr. Boyd Kerbs who informed a colonoscopy  on her in 2007 and per the patient's, colonoscopy was normal.   PAST MEDICAL HISTORY:  1. Hypertension.  2. Type 2 diabetes.  3. Chronic headaches.  4. Coronary artery disease.  5. She is status post endarterectomy.  6. Palpitations.  7. Macular degeneration  8. Trigeminal neuralgia.  9. She had a hysterectomy.  10.Tonsillectomy.  11.Appendectomy.   CURRENT MEDICATIONS:  Zithromax which was discontinued on February 7 for  a recent sinus infection, Zestoretic, verapamil, Lipitor, Xanax, and 81  mg aspirin, Prilosec, Colace, and I-caps.   ALLERGIES:  She has allergies to CODEINE, ESTROGEN and CEFOTAXIME.  She  is sensitive to BETA BLOCKER.   FAMILY HISTORY:  Family history is significant for mother with  gallbladder disease.  She has no colon cancer or ulcer disease in the  family.   SOCIAL  HISTORY:  Negative for tobacco, alcohol and drugs.   PHYSICAL EXAMINATION:  GENERAL:  She is alert and oriented, currently  has a headache.  HEART:  Regular rate and rhythm with no murmurs, rubs or gallops.  LUNGS:  Clear to auscultation bilaterally.  ABDOMEN:  Soft, nontender, nondistended with good bowel sounds.  No  apparent organomegaly, guarding or rebound.  VITAL SIGNS:  Her temperature is currently 98.1, pulse 67, respirations  20, blood pressure is 92/51.   LABORATORY DATA:  She is serum H. pylori negative.  Hemoglobin A1c is  6.8.  Lipase is 21, amylase 56.  TSH is normal.  BMET shows a sodium of  130, potassium of 3.4, glucose of 153.  CBC checked on February 7 shows  a hemoglobin of 13.9, hematocrit 41, white count 8.6 and platelets  284,000.  Chest x-ray shows minimal linear scarring or atelectasis.   ASSESSMENT:  Dr. Dorena Cookey has seen and examined the patient,  reviewed  her chart, collected a history.  His impression is  that this is a 77-  year-old female with a several-day history of nausea and belching on a  background of intermittent epigastric and right subscapular pain.  Will  begin workup with gallbladder ultrasound.  The patient already on  Prilosec with no alarm features, so probably will not need an upper  endoscopy at this point.   Thanks very much for this consultation.      Stephani Police, PA    ______________________________  Everardo All Madilyn Fireman, M.D.    MLY/MEDQ  D:  12/22/2007  T:  12/23/2007  Job:  [id number]   cc:   Tasia Catchings, M.D.

## 2011-03-27 NOTE — Op Note (Signed)
[name], [name]                ACCOUNT NO.:  [id number]   MEDICAL RECORD NO.:  [id number]          PATIENT TYPE:  AMB   LOCATION:  SDS                          FACILITY:  [facility]   PHYSICIAN:  Veverly Fells. Excell Seltzer, MD  DATE OF BIRTH:  1936-04-25   DATE OF PROCEDURE:  07/14/2008  DATE OF DISCHARGE:                               OPERATIVE REPORT   PROCEDURE:  1. Abdominal aortogram.  2. Bilateral selective renal angiography.   PROCEDURAL INDICATIONS:  [name] is a 75 year old woman who has had  chronic abdominal pain.  She underwent a CT scan of the abdomen and this  demonstrated a ulcerated plaque versus penetrating ulcer in the  abdominal aorta.  She has also had malignant hypertension with inability  to control blood pressure on multiple medicines.  I evaluated the  patient in the office and elected to proceed with angiography of the  abdominal aorta to better delineate her aortic pathology as well as  selective renal angiography in the setting of her refractory  hypertension on multiple medicines.   Risks and indications of the procedure were reviewed with the patient.  Informed consent was obtained.  The right groin was prepped, draped, and  anesthetized with 1% lidocaine using modified Seldinger technique.  A 5-  French sheath was placed in the right femoral artery.  A pigtail  catheter was placed in the suprarenal aorta and an abdominal aortography  was performed under digital subtraction.  The catheter was then brought  down into the infrarenal aorta and magnified views of the abdominal  aorta were performed in the AP projection as well as the lateral  projection.  A 5-French crossover catheter was then inserted and  selective renal angiography was performed bilaterally.  The patient  tolerated the procedure well.  There were no immediate complications.   FINDINGS:  The right renal artery has a shelf-like plaque at the ostium  what produces an approximate 40% stenosis.   The remaining portions of  that renal artery are normal with a catheter into the renal artery.  There was no pressure gradient.   The left renal artery is a single vessel that is widely patent.   Abdominal aorta.  The infrarenal abdominal aorta has an irregular and  ulcerated-appearing extensive plaque.  .  This spares the origin of the  common iliac arteries.  There is no angiographic evidence of dissection.   ASSESSMENT:  1. Complex ulcerated plaque of the infrarenal abdominal aorta.  2. Mild right renal artery stenosis.  3. Normal left renal artery.   PLAN:  [name] has severe aortic atherosclerosis with an ulcerated  plaque.  I do not think this is related to her chronic abdominal pain.  However, in the setting of the patient's severe hypertension, I am  concerned about her risk for progressive aortic pathology and  development of an acute aortic syndrome.  I have reviewed her  angiographic images with Dr. Madilyn Fireman who will evaluate her for aortic  stent grafting.      Veverly Fells. Excell Seltzer, MD  Electronically Signed     MDC/MEDQ  D:  07/14/2008  T:  07/15/2008  Job:  [id number]   cc:   [name] Beam, MD  [name] Codding, MD,FACC

## 2011-03-27 NOTE — Discharge Summary (Signed)
NAMEPORTIA, [name]                ACCOUNT NO.:  [id number]   MEDICAL RECORD NO.:  [id number]          PATIENT TYPE:  INP   LOCATION:  3316                         FACILITY:  [facility]   PHYSICIAN:  Balinda Quails, M.D.    DATE OF BIRTH:  06-30-1936   DATE OF ADMISSION:  08/25/2008  DATE OF DISCHARGE:  08/26/2008                               DISCHARGE SUMMARY   PATIENT OF:  Balinda Quails, MD   FINAL DIAGNOSES:  1. Penetrating atherosclerotic ulcer of the infrarenal aorta.  2. Hypertension.  3. Hyperlipidemia.   PROCEDURE PERFORMED:  Endovascular repair of penetrating atherosclerotic  ulcer using an Endologix stent 25 x 16 x 120 by Dr. Madilyn Fireman, August 25, 2008.   COMPLICATIONS:  None.   DISCHARGE MEDICATIONS:  She was instructed to resume all previous  medications consisting of verapamil ER 240 mg p.o. q.p.m. and verapamil  80 mg p.r.n. for increased heart rate, Prilosec 20 mg p.o. q.a.m.,  atenolol 25 mg one-half tablet of p.o. q.a.m., lisinopril, 10/12.5 one-  half tablet p.o. q.a.m., Colace one p.o. b.i.d., aspirin 81 mg p.o.  q.p.m., MiraLax one capful p.o. q.p.m., Lipitor 40 mg p.o. q.p.m., Xanax  0.25 mg one-half to one tablet p.o. q.p.m., ICap one p.o. b.i.d.,  Tylenol 325 mg two p.o. p.r.n.  She is given a prescription for Darvocet-  N 100 one p.o. q.4 h. p.r.n. pain.   CONDITION AT DISCHARGE:  Stable, improving.   DISPOSITION:  She is being discharged home in stable condition with her  wounds healing well.  She is given careful instructions regarding the  care of her wounds.  She is to increase her activity slowly.  She is  given careful instructions as to her activity level.  She is to see Dr.  Madilyn Fireman in 4 weeks with CTA of the abdomen and pelvis with stent graft  protocol and ABIs.  The office will arrange a visit.   BRIEF IDENTIFYING STATEMENT:  For complete details, please refer the  typed history and physical.  Briefly, this very pleasant 75 year old  woman was  evaluated by Dr. Madilyn Fireman for a penetrating atherosclerotic ulcer  of the infrarenal aorta.  Dr. Madilyn Fireman felt that she should undergo  endovascular repair.  She was informed of the risks and benefits of the  procedure and after careful consideration, he elected to proceed with  surgery.   HOSPITAL COURSE:  Preoperative workup was completed as an outpatient.  She was brought in through same-day surgery and underwent the  aforementioned endovascular repair.  For complete details, please refer  the typed  operative report.  The procedure was without complications.  She was  returned to the postanesthesia care unit extubated.  Following  stabilization, she was transferred to a bed on a surgical step-down  unit.  Following day, she was found to be stable.  She was desirous of  discharge and was discharged home.      [name] Arms, PA      P. Liliane Bade, M.D.  Electronically Signed    KEL/MEDQ  D:  08/26/2008  T:  08/27/2008  Job:  [id number]

## 2011-03-27 NOTE — Op Note (Signed)
[name], [name]                ACCOUNT NO.:  [id number]   MEDICAL RECORD NO.:  [id number]          PATIENT TYPE:  INP   LOCATION:  3316                         FACILITY:  [facility]   PHYSICIAN:  Balinda Quails, M.D.    DATE OF BIRTH:  [birth year]   DATE OF PROCEDURE:  08/25/2008  DATE OF DISCHARGE:                               OPERATIVE REPORT   SURGEON:  Balinda Quails, MD   ASSISTANTS:  Di Kindle. Edilia Bo, MD and Wilmon Arms, PA.   ANESTHETIC:  General endotracheal.   ANESTHESIOLOGIST:  Janetta Hora. Frederick, MD   PREOPERATIVE DIAGNOSIS:  Penetrating atherosclerotic ulcer, infrarenal  aorta.   POSTOPERATIVE DIAGNOSIS:  Penetrating atherosclerotic ulcer, infrarenal  aorta.   PROCEDURE:  Placement of Endologix PowerLink bifurcated covered stent  graft.   CONTRAST:  30 mL Visipaque.   COMPLICATIONS:  None apparent.   CLINICAL NOTE:  [name] is a 75 year old vasculopath who recently  underwent workup for lower extremity claudication by Dr. Excell Seltzer along  with renal arteriography.  She complains of a vague lower abdominal  discomfort and flank pain.  Arteriography revealed a deep  atherosclerotic penetrating ulcer of the infrarenal aorta.  Upon review  of these films and with the patient's symptoms, it was felt she was best  treated with a covered stent.   PROCEDURE NOTE:  The patient was brought to the operating room in stable  condition.  Placed in a supine position.  Both groins were prepped and  draped in sterile fashion.  General endotracheal anesthesia was induced.  Foley catheter, arterial line, and central venous catheter were in  place.   The contralateral left common femoral artery was accessed percutaneously  with an 18-gauge needle.  A 0.035  Bentson guidewire was advanced  through the needle into the mid abdominal aorta.  An 8-French sheath was  advanced over the guidewire and dilator was removed.  Sheath was flushed  with heparin saline  solution.   The right common femoral artery was accessed through an oblique  transverse incision.  Subcutaneous tissue and lymphatics were divided  with electrocautery.  Right inguinal ligament was identified.  The right  common femoral arteries mobilized  distally down to its bifurcation and  encircled with a vessel loop.  Right common femoral artery was moderate  in size with mild plaque.  The right common femoral artery was accessed  directly with an 18-gauge needle.  A 0.035 Bentson guidewire was  advanced up into the abdominal aorta.  A long 12-French sheath was  advanced over the guidewire, this is a tearaway sheath.  The sheath was  advanced up into the abdominal aorta and the dilator was removed.   The ipsilateral guidewire was exchanged over a catheter for an Amplatz  super stiff guidewire.  The ipsilateral right femoral access site was  then loaded onto a bifurcated Endologix stent graft with a 25 x 16 x 120  size graft.  This was advanced along the guidewire.  The contralateral  limb was then advanced through the tearaway sheath into the abdominal  aorta.  The contralateral  guidewire was then exchanged for a snare and  the snare was used to grasp the contralateral guidewire down into the  left groin.  The tearaway sheath then completely removed from the right  groin and the delivery catheter was advanced over the Amplatz super  stiff into the abdominal aorta proximal to the aortic bifurcation.  The  sheath was withdrawn exposing the contralateral limbs, which were  brought down onto the bifurcation.  The pull string was removed  releasing the device and the proximal colon was drawn back into the  sheath.   The contralateral limb was accessed with a 0.014 guidewire through the  hollow guidewire.  The contralateral limb was then deployed and the  outer wire was removed.  Using an Omni flush catheter, the contralateral  limb was accessed and then exchanged for a Bentson  guidewire.   A Coda balloon was advanced over the ipsilateral guidewire, was advanced  up into the body of the graft, and deployed to allow full apposition of  the graft to the aortic wall down to the aortic bifurcation.  The  contralateral limb was angioplastied with a 10 x 4 Powerflex balloon.  The ipsilateral limb was angioplastied with a Coda balloon.   Balloon catheters were then removed.  An Omni flush catheter was  advanced over the contralateral limb and an aortogram was obtained.  This revealed excellent result without evidence of technical problem  with good coverage of the penetrating ulcer and brisk flow through each  limb of the graft.  Hypogastric arteries and renal arteries are patent  bilaterally.   The right femoral guidewires and catheters were removed and the femoral  artery was controlled with clamps.  Transverse arteriotomy was closed  with 5-0 Prolene suture.  After flushing, the clamps were removed from  the right leg.   The patient was administered 50 mg of protamine intravenously.  Had  received 7000 units of heparin prior to intervention.   The left femoral sheath was removed percutaneously and pressure placed  on the left groin.  Bleeding was controlled.   Adequate hemostasis was obtained.  Sponge and instrument counts were  correct.  The right groin was closed with a running 3-0 Vicryl suture in  two subcutaneous layers and 4-0 Monocryl in the skin.  Dermabond was  applied.   The patient tolerated the procedure well.  Transferred to the recovery  room in stable condition.      Balinda Quails, M.D.  Electronically Signed     PGH/MEDQ  D:  08/25/2008  T:  08/26/2008  Job:  [id number]   cc:   Doreen Beam, MD  Veverly Fells. Excell Seltzer, MD

## 2011-03-27 NOTE — Assessment & Plan Note (Signed)
[facility]                          [facility] NOTE   NAME:[name], [name]                         MRN:          [id number]  DATE:06/16/2007                            DOB:          Jan 07, 1936    HISTORY OF PRESENT ILLNESS:  The patient is a pleasant female with a  history of palpitations previously diagnosed with a possible ectopic  atrial tachycardia.  This was demonstrated by a CardioNet monitor a year  ago.  The patient was also seen by Dr. Graciela Husbands in the interim after her  recent hospitalization where she reported palpitations.  Some change in  her medications were made.  In particular, the patient was maintained on  verapamil and  was given short-acting verapamil for breakthrough  episodes.  She has done well on this regimen.  She has seen Dr. Graciela Husbands in  the interim.  No specific recommendations or thoughts, however, were  given.  He thought that the patient could have excessive __________  adrenergic sensitivity.   The patient states that she has been doing well.  She has had a couple  of occasions of palpitations where her heart felt somewhat irregular.  She has had no associated symptoms.  Also, her blood pressure runs  completely normal at home, and she states that she has white coat  hypertension.   MEDICATIONS:  1. Lipitor 80 mg half a tablet p.o. daily.  2. Alprazolam 0.25 mg half a tablet q.h.s.  3. Aspirin 81 mg a day.  4. Lisinopril hydrochlorothiazide 20/25 mg half a tablet p.o. daily.  5. Omeprazole.  6. Verapamil 180 p.o. daily.  7. Docusate sodium 100 mg daily.   PHYSICAL EXAMINATION:  VITAL SIGNS:  Blood pressure 197/111, heart rate  is 120 (on EKG, however, the heart rate is 107).  Weight is 128 pounds.  NECK:  Normal carotid upstroke.  No carotid bruits.  LUNGS:  Clear.  HEART:  Regular rate and rhythm.  Normal S1 and S2.  ABDOMEN:  Soft.  EXTREMITIES:  No clubbing, cyanosis, or edema.   PROBLEM LIST:  1.  White coat hypertension.  The patient has normal blood pressure      readings at home.  2. Peripheral vascular disease, status post carotid endarterectomy.  3. Negative Cardiolite imaging study.  4. Anxiety.  5. Palpitations.  Rule out ectopic atrial tachycardia.   PLAN:  1. I still suspect the patient has underlying rhythm abnormality.  I      told her that if in the next couple of weeks she has persistent      problems, will reapply a CardioNet monitor.  2. Also, review of her records did not show that the patient ever had      an echocardiographic      study done, and will proceed with ordering one.  3. The patient will follow up in 6 months.     Learta Codding, MD,FACC  Electronically Signed    GED/MedQ  DD: 06/16/2007  DT: 06/16/2007  Job #: [id number]

## 2011-03-27 NOTE — Assessment & Plan Note (Signed)
[facility]                          [facility] NOTE   NAME:[name], [name]                       MRN:          [id number]  DATE:02/08/2009                            DOB:          Mar 31, 1936    REFERRING PHYSICIAN:  Doreen Beam, MD   HISTORY OF PRESENT ILLNESS:  The patient is a 75 year old female with  difficult to control of blood pressure.  The patient was recently seen  in the emergency room by Dr. Bernette Mayers who unfortunately changed the  patient's medications yet again.  She was taken off atenolol switched to  labetalol.  She also was taken off her hydrochlorothiazide due to  reportedly hyponatremia, although this is not mentioned in his ER notes.  The patient's blood pressure has been running within normal range at  home, she states.  Yesterday in Dr. Sherril Croon' office it was 134/81.  Unfortunately, today in our office, her blood pressure is 200/108 in the  right arm and 212/108 in the left arm.  The patient states that she has  episodes where she gets anxious and her blood pressure does shoot up all  of sudden.  We have worked her for secondary hypertension as mentioned  previously and this was negative.  The patient is now willing to  consider a workup in Hypertension Clinic at [facility].   MEDICATIONS:  1. Aspirin 81 mg p.o. daily.  2. Omeprazole 20 mg p.o. daily.  3. Colace 100 mg p.o. b.i.d.  4. Lipitor 40 mg p.o. daily.  5. Clonazepam 0.5 mg half a tablet b.i.d.  6. Labetalol 300 mg p.o. b.i.d.  7. Lisinopril 20 mg p.o. b.i.d.   PHYSICAL EXAMINATION:  VITAL SIGNS:  Blood pressure is as outlined above  212/108 in left arm, heart rate 95, and weight 126 pounds.  GENERAL:  Well nourished white female, in no apparent distress.  HEENT:  Pupils, eyes clear; conjunctivae clear.  NECK:  Supple.  Normal carotid upstroke.  No carotid bruits.  LUNGS:  Clear breath sounds bilaterally.  HEART:  Regular rate and rhythm.  Normal S1 and S2.  No  murmurs, rubs,  or gallops.  ABDOMEN:  Soft and nontender.  No rebound or guarding.  Good bowel  sounds.  EXTREMITIES:  No cyanosis, clubbing, or edema.  NEUROLOGIC:  The patient is alert, oriented, and grossly nonfocal.   PROBLEM LIST:  1. Hypertension difficult to control.  2. Hypertensive heart disease.  3. Tachycardia.  4. Peripheral vascular disease.  For details see prior note.  5. Generalized anxiety disorder.  6. Chronic tension-type headache.   PLAN:  1. I have made no change in the patient's blood pressure medication.      She tells me that her blood pressure was very well-controlled at      home.  2. At this point in time, I do think the patient is best served by a      referral to [facility]gent [facility] for further evaluation by      Hypertension Clinic.  3. I still suspect that there is a large component of anxiety involved  in the patient's erratic blood pressures.  We will leave further      management up to of Dr. Sherril Croon.     Learta Codding, MD,FACC  Electronically Signed    GED/MedQ  DD: 02/08/2009  DT: 02/09/2009  Job #: [id number]   cc:   Doreen Beam, MD

## 2011-03-27 NOTE — Assessment & Plan Note (Signed)
[facility]                          [facility] NOTE   NAME:[name], [name]                         MRN:          [id number]  DATE:02/10/2008                            DOB:          08-05-1936    HISTORY OF PRESENT ILLNESS:  The patient is a pleasant female with a  history of recurrent palpitations.  During the last office visit, I made  a change in medical regimen. In particular, we gave her verapamil SR 240  mg in the evening as her palpitations occurred more in the evening.  The  patient was also given atenolol 12.5 mg in the morning.  She has now  done remarkably well.  She states that since that, she has had no  recurrent palpitations and feels much better.  She has no shortness of  breath, orthopnea or PND.   MEDICATIONS:  1. Aspirin 81 mg p.o. daily.  2. Lisinopril/hydrochlorothiazide 20/12.5 mg 1/2 tablet daily.  3. Omeprazole 20 mg  p.o. daily.  4. Verapamil  SR 240 mg p.o. daily.  5. Docusate 100 mg p.o. b.i.d.  6. Lipitor 40 mg p.o. daily.  7. Alprazolam 0.25 mg 1/4 tablet nightly.  8. Atenolol 25 mg 1/2 tablet p.o. daily.   PHYSICAL EXAMINATION:  VITAL SIGNS:  Blood pressure 183/90, on repeat  151/90.  The patient, however, had blood pressure readings from home  which all were essentially within normal limits.  Weight 131 pounds.  NECK:  Normal carotid strokes, no carotid bruits.  LUNGS:  Clear breath sounds bilaterally.  HEART:  Regular rate and rhythm.  Normal S1-S2.  No murmur, rubs or  gallops.  ABDOMEN:  Soft, nontender.  No rebound or guarding.  Good bowel sounds.  EXTREMITIES:  No cyanosis, clubbing or edema.  NEUROLOGIC:  The patient is alert, oriented and grossly nonfocal.   PROBLEM LIST:  1. White coat hypertension.  2. Peripheral vascular disease status post carotid endarterectomy.  3. Negative Cardiolite imaging study.  4. Palpitations. Repeat [place]  monitor without any significant      arrhythmias.   PLAN:  1. The patient has been doing extremely well.  She has no recurrent      palpitations. Current      medical regimen seems to work for her.  2. The patient can follow up with [place] in 6 months.     Learta Codding, MD,FACC  Electronically Signed    GED/MedQ  DD: 02/10/2008  DT: 02/10/2008  Job #: [id number]   cc:   Doreen Beam, MD

## 2011-03-27 NOTE — Letter (Signed)
July 12, 2008    Doreen Beam, MD  [address]  [place], [place] [address]   RE:  VELORA, HORSTMAN  MRN:  [id number]  /  DOB:  [birth year]   Dear Dr. Sherril Croon:   It was my pleasure to see  Keiarah Orlowski as an outpatient on August 27  for evaluation of an abdominal aortic penetrating ulcer and severe  hypertension.   As you know, Mrs. Sarrazin is a delightful 75 year old woman.  Mrs. Kinsel  has left lower quadrant chronic abdominal pain over several years.  It  is worse in certain positions.  It seems unrelated to food or bowel  habits.  The pain is described as a dull ache.  The pain is constant  but the intensity waxes and wanes.  Her symptoms are exacerbated after  activities such as lifting.  She denies melena, hematochezia, or other  GI related complaints.  As part of her evaluation she underwent CT scan  of the abdomen with contrast.  I do not have the images to review but I  do have the official report which describes no evidence of aneurysm.  However, there is a description of penetrating ulcer or ulcerated plaque  in the infrarenal aorta.  There is no adenopathy or intraperitoneal free  fluid.  There were no other significant findings noted.   In addition, Mrs. Treanor has developed severe hypertension.  Her blood  pressure has been very difficult to control on multiple medications.  She commonly has a systolic blood pressures near 191 and diastolic  pressures near 100.  She denies history of CVA or TIA.  She denies  claudication symptoms.  She does complain of chronic headache.  She  denies blurry vision or amaurosis.  She describes episodes of floaters  where she sees spots.   CURRENT MEDICATIONS:  1. Omeprazole 20 mg twice daily.  2. ICAPS two daily.  3. Aspirin 81 mg daily.  4. Lipitor 40 mg daily.  5. Atenolol 25 mg one-half daily.  6. Alprazolam 0.25 mg one-quarter dose at bedtime.  7. Verapamil ER 240 mg daily.  8. Lisinopril/HCT 10/12.5 mg one-half daily.  9. Colace 200 mg  daily   ALLERGIES:  CODEINE causes upset stomach.   PAST MEDICAL HISTORY:  1. Carotid stenosis status post carotid endarterectomy in 1993.  2. Hypertension as outlined above.  3. Chronic palpitations.  4. History of lower back surgery 1982.  5. Hysterectomy 1984.  6. Remote tonsillectomy.  7. Hyperlipidemia.   SOCIAL HISTORY:  The patient is married.  She lives in [place].  She has  one grown child.  She was a former smoker but quit in 1982.  She does  not drink alcohol.  She does regular walking for exercise.   FAMILY HISTORY:  There is no history of aneurysm in the family.  There  is a long history of coronary artery disease in the patient's siblings  as well as her parents manifesting in their 56s.  The patient's father  died at age 8 of myocardial infarction.  She had three brothers who  passed away from complications of heart disease and diabetes.   REVIEW OF SYSTEMS:  Complete 12-point review of systems was performed.  Pertinent positives included constipation, hiatal hernia,  gastroesophageal reflux, anxiety, palpitations, headaches, and  dizziness.  All other systems were reviewed and are negative except as  detailed above.   PHYSICAL EXAMINATION:  GENERAL:  The patient is alert and oriented.  She  is in no  acute distress.  She is an age-appropriate white female.  VITAL SIGNS:  Weight 133 pounds, blood pressure 190/110 in the right  arm, 200/110 in the left arm, heart rate 80, respiratory rate 16.  HEENT:  Normal.  NECK:  Normal carotid upstrokes.  No bruits.  JVP normal.  No  thyromegaly or thyroid nodules.  LUNGS:  Clear bilaterally.  HEART:  The apex is discreet, nondisplaced.  HEART:  Regular rate and rhythm.  There is an S4 gallop.  There are no  murmurs.  There is no right ventricular heave or lift.  ABDOMEN:  Soft,  nontender, no abdominal bruits.  No masses, no rebound or guarding.  BACK:  No CVA tenderness.  EXTREMITIES:  No clubbing, cyanosis or edema.   Peripheral pulses are 2+  and equal throughout.  SKIN:  Warm and dry without rash.  NEUROLOGIC:  Cranial nerves II-XII are intact.  Strength intact and  equal.   ASSESSMENT:  1. Abdominal aortic penetrating ulcer.  This is likely an incidental      finding from her CT scan.  However, the implications are      significant and physiologically this is a significant aortic      pathology and is found on the spectrum of aortic dissection.  Her      abdominal pain is highly atypical and I suspect completely      unrelated to the finding of the penetrating ulcer since that she      has had this problem chronically and it is localized with dull pain      in the left lower quadrant.  However, I think it would be prudent      to evaluate her with catheter angiography to better delineate the      area of concern.  Will arrange for an abdominal aortogram for      further evaluation.  Further followup pending the results of this      study.  If this confirms a penetrating ulcer, treatment options      will be further medical therapy with control of blood pressure and      followup imaging versus more aggressive therapy with either      endovascular repair or aortic surgery.  Depending on the results of      the findings, I may involve vascular surgery.  2. Malignant hypertension.  The patient's blood pressure is very      difficult to control, currently on for antihypertensive      medications.  At the time of abdominal angiography, will evaluate      for renal artery stenosis as a cause of secondary hypertension.      Risks and indications of both abdominal angiography and selective      renal angiography with possible PTA were reviewed in detail with      the patient and her husband, who agree to proceed.  Will determine      further followup based on the results of these studies.   Dr. Sherril Croon, thanks again for the opportunity to participate in the care of  Mrs. Voyles.  Please feel free to call  at any time with questions  regarding her care.    Sincerely,      Veverly Fells. Excell Seltzer, MD  Electronically Signed    MDC/MedQ  DD: 07/12/2008  DT: 07/12/2008  Job #: [id number]

## 2011-03-27 NOTE — Assessment & Plan Note (Signed)
OFFICE VISIT   [name], [name]  DOB:  [birth year]                                       09/30/2008  VOZDG#:[id number]   The patient underwent placement of an aortic stent graft for penetrating  atherosclerotic ulcer of the infrarenal aorta.  This was carried out  08/25/2008.  No perioperative complications.  The patient was discharged  home 08/26/2008 in good condition.   PAST MEDICAL HISTORY:  Includes hypertension, hyperlipidemia.  Workup  for renal vascular disease has been unremarkable by Dr. Excell Seltzer.   Blood pressure remains poorly controlled, today 179/101.  Pulse is 67  per minute.  Her abdomen is soft and nontender.  Right groin incision  well-healed.  2+ femoral pulses bilaterally.   Ankle brachial indices measure greater than 0.9 bilaterally.  Limited CT  scan was carried out today due to the patient's intolerance of contrast,  however, there is no evidence of complicating features.   I have reassured the patient that her stent graft procedure went well  without evidence of complications.  She remains quite anxious in  general.  With her blood pressure being poorly controlled, I have asked  her to follow up with her medical doctor regarding this.   Will plan followup again in 6 months with an ultrasound of the abdomen.  The plan is to avoid contrast at this time as she is intolerant of it.   Balinda Quails, M.D.  Electronically Signed   PGH/MEDQ  D:  09/30/2008  T:  10/01/2008  Job:  [id number]   cc:   Veverly Fells. Excell Seltzer, MD  Doreen Beam, MD

## 2011-03-27 NOTE — Assessment & Plan Note (Signed)
OFFICE VISIT   [name], STARON  DOB:  [birth year]                                       10/21/2009  ZOXWR#:[id number]   The patient presents today for evaluation of lower extremity pathology.  She had been treated in our office in the past by Dr. Liliane Bade who is  no longer with our practice.  I reviewed her old charts extensively  including her arteriogram done on July 14, 2008, a recent  arteriogram done on August 31, 2009 and also the reports of the  procedure with surgery done on August 25, 2008, with Endologix stent  graft.  She had initially presented with abdominal discomfort and had an  arteriogram showing a penetrating ulcer.  She was seen by Dr. Liliane Bade  who recommended an Endologix stent graft for this.  This was placed  uneventfully.  She was seen once back by Dr. Madilyn Fireman in November 2009 and  then did not keep her follow-up ultrasound appointment.  At that time  she was found to have palpable femoral pulses and ankle arm indices  showed normal pressures bilaterally.  The patient reports that she had  right leg claudication symptoms immediately following procedure and this  has persisted.  She underwent a recent arteriogram in October with Dr.  Excell Seltzer.  This shows occlusion of her right limb of her Endologix  aortobifemoral graft with reconstitution of her external iliac via  internal iliac collaterals.  She has normal superficial femoral,  popliteal and three-vessel runoff bilaterally.  She does report moderate  claudication in her right leg.  She reports that her routine activities  around the house do not cause any discomfort, but if she is walking for  a great deal of time (in her example in a [employer]) that she does have  to stop with some aching and numbness sensation.  This is relieved with  rest.  She does not have any history of tissue loss and does not have  any left leg symptoms.   REVIEW OF SYSTEMS:  Positive also for severe  hypertension which she has  had ongoing treatment for.  She does have a premature family history of  atherosclerotic disease in her mother, father and siblings.  She is  married.  She is retired.  She has one child.  She quit smoking in 1981.  Does have a history of left carotid endarterectomy by Dr. Blair Dolphin  many years ago with no neurologic deficits since that time.   PHYSICAL EXAMINATION:  Well-developed white female appearing stated age  of 71.  Blood pressure today is 189/99, pulse 76, respirations 18,  temperature 98.0.  She is well-nourished, well-developed.  HEENT:  Pupils are equal, round, reactive to light.  Her extraocular movements  are intact.  Neck:  Shows no cervical adenopathy.  She does have well-  healed left neck incision and does not have any bruits.  No masses.  Chest is clear bilaterally.  Heart:  Regular rate and rhythm.  Abdomen  is soft, nontender.  She does have a 2+ left femoral pulse and absent  right femoral pulse.  She has no tissue loss on the lower extremities.  Neurologically she is intact.  She has no ulcers or rashes.   I had a very long discussion with the patient and her husband present.  I did explain  that she clearly has had occlusion of the right limb of  her Endologix aortic stent graft.  I explained that the typical  indication for the placement of these would be aneurysmal disease and  since she does have an issue with contrast I would not recommend CT  follow-up since she does have no evidence of flow into the prior  penetrating ulcer.  I explained that her claudication is a stable  situation and that she has obviously developed adequate collaterals.  She tells me that her ankle arm index is 0.5.  I do not have this data.  I have recommended that we see her again in one year for continued  discussion of her claudication.  I did explain the option for treatment  would be femoral-femoral bypass and explained the procedure including  the  expected duration of the procedure, recovery and also potential for  occlusion of the graft.  She reports that she is currently comfortable  with her level of claudication and will notify should she develop any  tissue loss or worsening claudication symptoms.  Otherwise we will see  her in 1 year with repeat ankle arm indices.   [name], M.D.  Electronically Signed   TFE/MEDQ  D:  10/21/2009  T:  10/24/2009  Job:  [id number]   cc:   Learta Codding, MD,FACC  Doreen Beam, MD  Veverly Fells. Excell Seltzer, MD

## 2011-03-30 NOTE — Assessment & Plan Note (Signed)
[facility]                            [facility]   NAME:[name] [name]                         MRN:          [id number]  DATE:09/06/2006                            DOB:          1936/04/06    HISTORY OF PRESENT ILLNESS:  The patient is a 75 year old female recently  admitted August 06, 2006 with palpitations secondary to an ectopic atrial  tachycardia.  The patient also has a history of peripheral vascular disease  and is status post endarterectomy.  The patient did have cardiac risk  factors, but no documented coronary artery disease with a stress study  performed in followup was negative for ischemia.  During hospitalization,  the patient was taken off beta blocker and started on Isoptin SR to simplify  her medical regimen as treatment for her ectopic atrial tachycardia.  She  states that overall she is doing better with less episodes, but still on  occasion has very rare episodes of palpitations.  The patient wore a  CardioNet monitor and we have reviewed this today.  There were only 2  episodes of tachycardia, which appeared to be an ectopic atrial tachycardia,  1 episode was recorded at 173 beats per minute.  It did appear overall,  however, that the patient's ectopic atrial tachycardic rhythm had improved.  She denies any substernal chest pain or shortness of breath.  The patient  again remains very inquisitive and concerned regarding her medical therapy  regimen.   MEDICATIONS:  1. Lipitor 80 mg half tab p.o. q. day.  2. Verapamil 120 mg p.o. q. day 1 tablet a day.  3. Lisinopril hydrochlorothiazide 20/25 mg p.o. q. day.  4. Alprazolam as needed.  5. Aspirin 81 mg a day.  6. Multivitamin.   PHYSICAL EXAM:  VITAL SIGNS:  Blood pressure is 140/84, heart rate 74 beats  per minute.  NECK:  Normal carotid upstroke.  No carotid bruits.  LUNGS:  Clear breath sounds bilaterally.  HEART:  Regular rate and rhythm.  Normal S1,  S2.  No murmurs, rubs, or  gallops.  ABDOMEN:  Soft.  EXTREMITIES:  No cyanosis, clubbing, or edema.   PROBLEMS:  1. Palpitations.      a.     Atrial tachycardia.      b.     High right atrial focus.  2. History of migraine headaches.  3. History of peripheral vascular disease.      a.     Status post carotid endarterectomy.  4. Negative Cardiolite stress study.  5. Hypertension.   PROBLEM LIST:  1. The patient appears to have occasional breakthrough episodes of      palpitations on CardioNet.  This appeared to be again a documented      ectopic atrial tachycardia.  I have asked the patient to double her      Isoptin SR and take 120 mg in the morning and then 40 mg in the      evening, particularly because most of her symptoms occur in the evening      and in the night time.  2. The patient will followup with me in several months.  3. Otherwise, no change in the patient's medical regimen.  I do not think      that she is a candidate for radiofrequency ablation as the total burden      of her ectopic atrial tachycardia appears to be low.     Learta Codding, MD,FACC    GED/MedQ  DD: 09/06/2006  DT: 09/08/2006  Job #: [id number]   cc:   Weyman Pedro

## 2011-03-30 NOTE — Assessment & Plan Note (Signed)
[facility]                          [facility] OFFICE NOTE   NAME:[name], [name]                         MRN:          [id number]  DATE:10/09/2006                            DOB:          [birth year]    [name] returns today for further evaluation regarding her  palpitations, which are secondary to an ectopic atrial tachycardia.  She  saw Dr. Andee Lineman in October, at which time he had increased her Isoptin SR  from 120 mg daily to 120 mg b.i.d., and instructed patient to return for  a followup visit.  [name] states she did increase the Isoptin to 120  b.i.d., however, she noticed increased intensity and frequency in her  palpitations, and she stopped the medication b.i.d.  She continues to  take 120 mg in the morning.  She states this has been okay.  She has not  experienced any further episodes of palpitations other than when she  exerts herself too much.  She denies any episodes of chest discomfort,  presyncope or syncopal episodes.   CURRENT MEDICATIONS:  1. Lipitor 80 mg.  The patient takes 1/2 tablet daily.  2. Verapamil 120 daily.  3. Lisinopril/HCTZ 20/25 mg 1/2 tablet daily.  4. Xanax as needed.  5. Aspirin 81 mg daily.  6. Multivitamin daily.   PAST MEDICAL HISTORY:  1. Palpitations, status post CardioNet monitor, which showed 2      episodes of tachycardia, which appear to be ectopic atrial      tachycardia.  2. History of migraine headaches.  3. History of peripheral vascular disease, status post carotid      endarterectomy.  4. Negative Cardiolite stress study.  5. Hypertension.  6. Mild anxiety.   REVIEW OF SYMPTOMS:  As stated above in history of present illness.  Otherwise, negative.   PHYSICAL EXAMINATION:  Blood pressure 158/88 with a heart rate of 70.  Weight 128 pounds.  Patient in no acute distress.  Normocephalic and atraumatic.  Neck veins are flat.  Lungs are clear to auscultation bilaterally.  Cardiovascular  exam reveals an S1 and S2, regular rate and rhythm.  Abdomen soft and non-tender.  Positive bowel sounds.  Lower extremities without clubbing, cyanosis or edema.  Neurologically, patient alert and oriented x3.  Cranial nerves 2 through  12 grossly intact.   IMPRESSION:  [name] with episode of palpitations secondary to ectopic  atrial tachycardia, currently asymptomatic in the setting of intolerance  to calcium channel blocker titration.  Will continue Isoptin SR at 120  mg daily.  Have patient follow up p.r.n.  If patient continues to  experience episodes of tachycardia, will send patient to [facility] for  electrophysiology evaluation, as she does not want to titrate her  medication at this time.      Dorian Pod, ACNP  Electronically Signed      Learta Codding, MD,FACC  Electronically Signed   MB/MedQ  DD: 10/09/2006  DT: 10/09/2006  Job #: [id number]   cc:   Weyman Pedro

## 2011-07-06 ENCOUNTER — Encounter: Payer: Self-pay | Admitting: Vascular Surgery

## 2011-08-03 LAB — BASIC METABOLIC PANEL
BUN: 17
CO2: 23
Chloride: 94 — ABNORMAL LOW
Creatinine, Ser: 0.82
Glucose, Bld: 153 — ABNORMAL HIGH
Potassium: 3.4 — ABNORMAL LOW

## 2011-08-03 LAB — POCT CARDIAC MARKERS
CKMB, poc: <1 — ABNORMAL LOW
CKMB, poc: <1 — ABNORMAL LOW
Myoglobin, poc: 36.8
Myoglobin, poc: 44.4
Operator id: 284141
Troponin i, poc: <0.05

## 2011-08-03 LAB — URINALYSIS, ROUTINE W REFLEX MICROSCOPIC
Glucose, UA: NEGATIVE
Nitrite: NEGATIVE
Protein, ur: NEGATIVE
pH: 6

## 2011-08-03 LAB — CARDIAC PANEL(CRET KIN+CKTOT+MB+TROPI)
CK, MB: 0.8
CK, MB: 1
Relative Index: INVALID
Total CK: 54
Troponin I: 0.01
Troponin I: <0.01

## 2011-08-03 LAB — I-STAT 8, (EC8 V) (CONVERTED LAB)
Acid-base deficit: 2
Bicarbonate: 22.3
Hemoglobin: 13.9
Operator id: 272551
Potassium: 3.9
Sodium: 131 — ABNORMAL LOW
TCO2: 23

## 2011-08-03 LAB — DIFFERENTIAL
Lymphocytes Relative: 3 — ABNORMAL LOW
Lymphs Abs: 0.3 — ABNORMAL LOW
Monocytes Relative: 5
Neutro Abs: 7.8 — ABNORMAL HIGH
Neutrophils Relative %: 91 — ABNORMAL HIGH

## 2011-08-03 LAB — HEMOGLOBIN A1C: Mean Plasma Glucose: 165

## 2011-08-03 LAB — URINE CULTURE

## 2011-08-03 LAB — LIPID PANEL
Cholesterol: 125
LDL Cholesterol: 69
Total CHOL/HDL Ratio: 3.8
Triglycerides: 114
VLDL: 23

## 2011-08-03 LAB — H. PYLORI ANTIBODY, IGG: H Pylori IgG: 0.9

## 2011-08-03 LAB — CBC
MCV: 85.5
RBC: 4.41
WBC: 8.6

## 2011-08-03 LAB — APTT: aPTT: 34

## 2011-08-03 LAB — CK TOTAL AND CKMB (NOT AT ARMC): Total CK: 58

## 2011-08-03 LAB — PROTIME-INR: INR: 0.9

## 2011-08-03 LAB — TSH: TSH: 1.644

## 2011-08-13 ENCOUNTER — Encounter: Payer: Self-pay | Admitting: Vascular Surgery

## 2011-08-13 LAB — CBC
HCT: 39.6
Hemoglobin: 9.7 — ABNORMAL LOW
MCHC: 33.5
MCHC: 33.9
MCV: 87.4
MCV: 88.3
Platelets: 226
Platelets: 244
RDW: 13.9
RDW: 14
WBC: 3.7 — ABNORMAL LOW
WBC: 4.5

## 2011-08-13 LAB — GLUCOSE, CAPILLARY
Glucose-Capillary: 117 — ABNORMAL HIGH
Glucose-Capillary: 120 — ABNORMAL HIGH
Glucose-Capillary: 142 — ABNORMAL HIGH
Glucose-Capillary: 143 — ABNORMAL HIGH
Glucose-Capillary: 162 — ABNORMAL HIGH

## 2011-08-13 LAB — BASIC METABOLIC PANEL
BUN: 9
BUN: 9
Calcium: 9
Calcium: 9.2
Chloride: 103
Creatinine, Ser: 0.65
Creatinine, Ser: 0.75
GFR calc Af Amer: >60
GFR calc non Af Amer: >60

## 2011-08-13 LAB — URINALYSIS, ROUTINE W REFLEX MICROSCOPIC
Bilirubin Urine: NEGATIVE
Glucose, UA: NEGATIVE
Hgb urine dipstick: NEGATIVE
Specific Gravity, Urine: 1.008
Urobilinogen, UA: 0.2
pH: 7

## 2011-08-13 LAB — COMPREHENSIVE METABOLIC PANEL
ALT: 18
AST: 22
Albumin: 4.6
Alkaline Phosphatase: 73
Chloride: 104
GFR calc Af Amer: >60
Potassium: 4.2
Sodium: 135
Total Bilirubin: 0.6
Total Protein: 6.8

## 2011-08-13 LAB — PROTIME-INR
INR: 0.9
INR: 1.1
Prothrombin Time: 14.3

## 2011-08-13 LAB — BLOOD GAS, ARTERIAL
FIO2: 0.21
Patient temperature: 98.6
TCO2: 23.1
pCO2 arterial: 60.5
pH, Arterial: 7.172 — CL

## 2011-08-13 LAB — TYPE AND SCREEN
ABO/RH(D): O NEG
Antibody Screen: NEGATIVE

## 2011-08-14 ENCOUNTER — Encounter: Payer: Self-pay | Admitting: Vascular Surgery

## 2011-08-14 ENCOUNTER — Ambulatory Visit (INDEPENDENT_AMBULATORY_CARE_PROVIDER_SITE_OTHER): Payer: Medicare Other | Admitting: Vascular Surgery

## 2011-08-14 ENCOUNTER — Encounter (INDEPENDENT_AMBULATORY_CARE_PROVIDER_SITE_OTHER): Payer: Medicare Other | Admitting: *Deleted

## 2011-08-14 VITALS — BP 210/115 | HR 85 | Resp 16 | Ht 60.5 in | Wt 125.0 lb

## 2011-08-14 DIAGNOSIS — I70219 Atherosclerosis of native arteries of extremities with intermittent claudication, unspecified extremity: Secondary | ICD-10-CM

## 2011-08-14 DIAGNOSIS — Z48812 Encounter for surgical aftercare following surgery on the circulatory system: Secondary | ICD-10-CM

## 2011-08-14 DIAGNOSIS — I739 Peripheral vascular disease, unspecified: Secondary | ICD-10-CM

## 2011-08-14 NOTE — Procedures (Unsigned)
LOWER EXTREMITY ARTERIAL DUPLEX  INDICATION:  Follow up left lower extremity.  HISTORY: Diabetes:  Yes. Cardiac:  No. Hypertension:  Yes. Smoking:  Previous. Previous Surgery:  Known occlusion by recent angiogram of right limb of the Endologix aorta bifemoral stent graft placed, 08/25/2008, by Dr. Madilyn Fireman.  SINGLE LEVEL ARTERIAL EXAM                         RIGHT                LEFT Brachial:               210                  219 Anterior tibial:        109                  168 Posterior tibial:       109                  183 Peroneal: Ankle/Brachial Index:   0.50                 0.84  LOWER EXTREMITY ARTERIAL DUPLEX EXAM  DUPLEX:  Patent left lower extremity arterial system with velocity measurements shown on the following worksheet.  IMPRESSION: 1. Ankle brachial indices and Doppler waveforms suggest moderately     decreased perfusion of the right lower extremity and mildly     decreased perfusion of the left lower extremity. 2. Decrease of bilateral ankle brachial indices in comparison to the     previous examination. 3. Left lower extremity arterial system appears patent with velocity     measurements noted on the following worksheet.  ___________________________________________ [name], M.D.  EM/MEDQ  D:  08/14/2011  T:  08/14/2011  Job:  [id number]

## 2011-08-14 NOTE — Progress Notes (Signed)
The patient presents today for continued followup of her lower should the arterial insufficiency. She had a Endologics stent graft placed by Dr. Lolita Lenz 2009. He had early right limb occlusion and has been followed in our office for this. She does have nonlimiting claudication. She reports that his whole leg and she is able to do her normal housework and normal activities despite this. Her main problem continues to be chronic migraine headaches. She also has extreme hypertension. She reports that her blood pressure was 249 systolic last week. Today she is 213/104 in her right arm and 210/115 in her left arm.  Past Medical History  Diagnosis Date  . Hypertension     poorly controlled  . Arrhythmia     tachypalpitations cardiac moniter time 2 evaluated by Dr.Kline in june 2008 atrial tachycardia ruled out,adrenaline or excess adrenergic sensitivity ruled out  . Normal nuclear stress test     10/2007staus post cardiac cath in the mid 90s nonobstructive coronary artery disease  . Diabetes mellitus     per patient diet control  . History of migraine headaches     chronic  . Macular degeneration   . Left lower quadrant pain     not resolved post stenting aortic penetrating ulcer distal aortic pentrating ulcer treated with endograft 07/2008  . Anxiety disorder     probable somatic anxiety disorder  . Diverticulosis     left sided noted on colonoscopy 02/2006  . Chronic constipation     secondary to verapamil but present priot to use  . Esophageal reflux disease   . Allergy to ACE inhibitors     questionable cough on ace inhibitors currently tolerating  . Chronic low back pain     lumbosacral spine changes were noted in MRI scan with left radiculopathy 12/2004  . Hyperlipidemia   . Hiatal hernia   . Arthritis   . Dizziness   . Anxiety     History  Substance Use Topics  . Smoking status: Former Smoker    Quit date: 11/12/1986  . Smokeless tobacco: Not on file   Comment: smoked  for 1 year  . Alcohol Use: No    Family History  Problem Relation Age of Onset  . Heart failure Mother   . Coronary artery disease Mother   . Heart disease Mother   . Heart attack Father   . Heart disease Father   . Heart disease Brother     Allergies  Allergen Reactions  . Cefixime     REACTION: short of breath  . Codeine     REACTION: dizziness  . Conjugated Estrogens     REACTION: flushing  . Iohexol      Desc: pt has anxiety and an irregular heartbeat. contrast exacerbates this. pt was very uncomfortable after a 20cc bolus for an miroi. we did w/o iv 11/09 per dr. Eppie Gibson. pt will talk w/ md about this and will probably not get iv contrast in the future at her re, Onset Date: [id number]     Current outpatient prescriptions:acetaminophen (TYLENOL) 325 MG tablet, Take 650 mg by mouth every 6 (six) hours as needed.  , Disp: , Rfl: ;  aspirin 81 MG tablet, Take 81 mg by mouth daily.  , Disp: , Rfl: ;  atorvastatin (LIPITOR) 40 MG tablet, Take 20 mg by mouth at bedtime. , Disp: , Rfl: ;  calcium-vitamin D (OSCAL WITH D) 500-200 MG-UNIT per tablet, Take 1 tablet by mouth 2 (two) times daily.  ,  Disp: , Rfl:  clonazePAM (KLONOPIN) 0.5 MG tablet, 1/4 tab by mouth at bedtime, Disp: , Rfl: ;  cloNIDine (CATAPRES) 0.1 MG tablet, Take 0.1 mg by mouth as needed. , Disp: , Rfl: ;  docusate sodium (COLACE) 100 MG capsule, Take 100 mg by mouth 2 (two) times daily.  , Disp: , Rfl: ;  labetalol (NORMODYNE) 200 MG tablet, Take 1/2 by mouth in morning and 1 by mouth at bedtime, Disp: , Rfl:  lisinopril (PRINIVIL,ZESTRIL) 20 MG tablet, Take 20 mg by mouth daily.  , Disp: , Rfl: ;  Multiple Vitamins-Minerals (PRESERVISION/LUTEIN) CAPS, Take by mouth daily.  , Disp: , Rfl: ;  omeprazole (PRILOSEC) 20 MG capsule, Take 20 mg by mouth daily. , Disp: , Rfl: ;  polyethylene glycol (MIRALAX / GLYCOLAX) packet, Take 17 g by mouth every other day. , Disp: , Rfl: ;  hydrochlorothiazide 25 MG tablet, Take 25 mg by mouth  daily.  , Disp: , Rfl:  ibuprofen (ADVIL,MOTRIN) 200 MG tablet, Take 200 mg by mouth every 6 (six) hours as needed.  , Disp: , Rfl:   BP 210/115  Pulse 85  Resp 16  Ht 5' 0.5" (1.537 m)  Wt 125 lb (56.7 kg)  BMI 24.01 kg/m2  Body mass index is 24.01 kg/(m^2).       Physical exam: Well-developed well-nourished white female no acute distress. 2+ radial pulses she actually does have a faint right femoral pulse and 2+ left femoral pulse she has 2+ left dorsalis pedis pulse. She has no lower extremity tissue loss. Abdomen soft nontender no masses noted.  Lower extremity arterial Doppler. Left ABI of 0.50, right of 0.84 with triphasic waveforms.  Impression and plan: I discussed this at length with the patient and her husband present. She will followup with her medical doctor regarding her hypertension and migraine. I explained we would not make any recommendations based on duplex ultrasound. Since she is not having any significant claudication that is limiting her I felt comfortable observation only. Expend we would not base treatment on her ankle arm index. She does have significant contrast reaction therefore is not getting serial followup of her stent graft. This was for penetrating ulcer which completely resolved not for aneurysm. She is comfortable with this discussion and we'll see [place] if she develops progressive claudication symptoms.

## 2011-08-21 ENCOUNTER — Telehealth: Payer: Self-pay | Admitting: *Deleted

## 2011-08-21 NOTE — Telephone Encounter (Signed)
Patient & husband walked into office stating that Dr. Sherril Croon told her to come here to be seen.  Call placed to Dr. Sherril Croon office - he told her to call the office to see if she could be seen sooner than her November 7 appointment.  He did not request for her to be seen today.  She was c/o dizziness & htn.  Advised her that Dr. Andee Lineman is out of office today, but would send phone message. Did reschedule her for Tuesday, 10/16 with Dr. Kirke Corin.  Advised her to go to ED for worsening symptoms.

## 2011-08-22 NOTE — Telephone Encounter (Signed)
I saw that she saw Dr. Arbie Cookey and she had very high BP again. She needs to show records of her home BP readings ( 2 times a day) and needs to bring this to appointment.

## 2011-08-24 ENCOUNTER — Encounter: Payer: Self-pay | Admitting: Cardiovascular Disease

## 2011-08-24 NOTE — Telephone Encounter (Signed)
Husband notified of below,

## 2011-08-28 ENCOUNTER — Ambulatory Visit: Payer: Medicare Other | Admitting: Cardiovascular Disease

## 2011-09-13 ENCOUNTER — Encounter: Payer: Self-pay | Admitting: Cardiology

## 2011-09-13 ENCOUNTER — Ambulatory Visit (INDEPENDENT_AMBULATORY_CARE_PROVIDER_SITE_OTHER): Payer: Medicare Other | Admitting: Cardiology

## 2011-09-13 VITALS — BP 188/106 | HR 78 | Ht 61.0 in | Wt 123.0 lb

## 2011-09-13 DIAGNOSIS — I701 Atherosclerosis of renal artery: Secondary | ICD-10-CM

## 2011-09-13 DIAGNOSIS — I10 Essential (primary) hypertension: Secondary | ICD-10-CM

## 2011-09-13 DIAGNOSIS — I70219 Atherosclerosis of native arteries of extremities with intermittent claudication, unspecified extremity: Secondary | ICD-10-CM

## 2011-09-13 DIAGNOSIS — I119 Hypertensive heart disease without heart failure: Secondary | ICD-10-CM

## 2011-09-13 DIAGNOSIS — I2510 Atherosclerotic heart disease of native coronary artery without angina pectoris: Secondary | ICD-10-CM

## 2011-09-13 DIAGNOSIS — R109 Unspecified abdominal pain: Secondary | ICD-10-CM

## 2011-09-13 MED ORDER — LISINOPRIL 20 MG PO TABS: 20.0000 mg | ORAL_TABLET | ORAL | Status: DC

## 2011-09-13 MED ORDER — CLONIDINE HCL 0.1 MG PO TABS: 0.0500 mg | ORAL_TABLET | Freq: Every day | ORAL | Status: DC

## 2011-09-13 NOTE — Patient Instructions (Signed)
   CTA of abdomen   Change Lisinopril to AM  Change Clonidine to 0.1mg  - 1/2 tab every evening  Follow up in 3-4 weeks - see above

## 2011-09-14 ENCOUNTER — Other Ambulatory Visit: Payer: Self-pay | Admitting: Cardiology

## 2011-09-14 DIAGNOSIS — I119 Hypertensive heart disease without heart failure: Secondary | ICD-10-CM

## 2011-09-14 DIAGNOSIS — I701 Atherosclerosis of renal artery: Secondary | ICD-10-CM

## 2011-09-17 ENCOUNTER — Telehealth: Payer: Self-pay | Admitting: *Deleted

## 2011-09-17 NOTE — Telephone Encounter (Signed)
CTA of Abdomen scheduled for 09-19-2011 @ [facility] Checking percert

## 2011-09-17 NOTE — Telephone Encounter (Signed)
No precert required 

## 2011-09-18 NOTE — Assessment & Plan Note (Signed)
Blood pressure remains difficult to control. We will rule out renal artery stenosis with CTA of the abdomen and pelvis. In the meanwhile I've asked the patient to take her lisinopril in the morning and a full dose of clonidine in the evening. Further increases in medications may be necessary and will followup after her CT of the abdomen in a couple of weeks. She states however that her blood pressure most of the time at home since her ER visit has been within normal limits.

## 2011-09-18 NOTE — Progress Notes (Signed)
CC: Followup after ER visit for severe hypertension  HPI:  The patient is a 75 year old female with a history of difficult to control hypertension. She has significant vascular disease or prior history of aortic penetrating ulcer requiring endograft repair. She has residual claudication secondary to limb stenosis in the right lower extremity. Is followed by vascular surgery. She was recently seen in the emergency room with headache and dizziness in blood pressure which was very elevated. She received clonidine with improvement in her blood pressure. She states since her ER visit she has been doing better with better blood pressure control. She takes a half of clonidine as needed. She feels weak and has decreased exercise tolerance. She denies however any chest pain shortness of breath orthopnea PND   PMH: reviewed and listed in Problem List in Electronic Records (and see below)  Allergies/SH/FHX : available in Electronic Records for review  Medications: Current Outpatient Prescriptions  Medication Sig Dispense Refill  . acetaminophen (TYLENOL) 325 MG tablet Take 650 mg by mouth every 6 (six) hours as needed.        Marland Kitchen aspirin 81 MG tablet Take 81 mg by mouth daily.        Marland Kitchen atorvastatin (LIPITOR) 40 MG tablet Take 20 mg by mouth at bedtime.       . calcium-vitamin D (OSCAL WITH D) 500-200 MG-UNIT per tablet Take 1 tablet by mouth 2 (two) times daily.        . clonazePAM (KLONOPIN) 0.5 MG tablet 1/4 tab by mouth at bedtime      . cloNIDine (CATAPRES) 0.1 MG tablet Take 0.5 tablets (0.05 mg total) by mouth at bedtime.  60 tablet  3  . docusate sodium (COLACE) 100 MG capsule Take 100 mg by mouth 2 (two) times daily.        Marland Kitchen labetalol (NORMODYNE) 200 MG tablet Take 200 mg by mouth 2 (two) times daily.       Marland Kitchen lisinopril (PRINIVIL,ZESTRIL) 20 MG tablet Take 1 tablet (20 mg total) by mouth every morning.      . Multiple Vitamins-Minerals (PRESERVISION/LUTEIN) CAPS Take by mouth daily.        Marland Kitchen  omeprazole (PRILOSEC) 20 MG capsule Take 20 mg by mouth daily.       . polyethylene glycol (MIRALAX / GLYCOLAX) packet Take 17 g by mouth every other day.         ROS: No nausea or vomiting. No fever or chills.No melena or hematochezia.No bleeding.No claudication  Physical Exam: BP 188/106  Pulse 78  Ht 5\' 1"  (1.549 m)  Wt 123 lb (55.792 kg)  BMI 23.24 kg/m2 General: White female in no distress Neck: Normal carotid upstroke with left carotid bruit a left carotid endarterectomy scar. No thyromegaly nonnodular thyroid Lungs: Clear breath sounds bilaterally Cardiac: Regular rate and rhythm with normal S1-S2 no murmur rubs or gallops Vascular: Unable to palpate dorsalis visit posterior tibial pulses bilaterally Skin: Warm and dry.  12lead ECG: Not available Limited bedside ECHO:N/A   Assessment and Plan

## 2011-09-18 NOTE — Assessment & Plan Note (Signed)
Patient followed by vascular surgery. Continued on conservative management with the patient now reports claudication both lower extremities.

## 2011-09-18 NOTE — Assessment & Plan Note (Signed)
No recurrent chest pain. No evidence of ischemia

## 2011-09-19 ENCOUNTER — Ambulatory Visit: Payer: Medicare Other | Admitting: Cardiology

## 2011-09-27 ENCOUNTER — Encounter: Payer: Self-pay | Admitting: Cardiology

## 2011-09-27 ENCOUNTER — Ambulatory Visit (INDEPENDENT_AMBULATORY_CARE_PROVIDER_SITE_OTHER): Payer: Medicare Other | Admitting: Cardiology

## 2011-09-27 VITALS — BP 191/98 | HR 73 | Ht 61.0 in | Wt 123.0 lb

## 2011-09-27 DIAGNOSIS — I10 Essential (primary) hypertension: Secondary | ICD-10-CM

## 2011-09-27 DIAGNOSIS — G4700 Insomnia, unspecified: Secondary | ICD-10-CM

## 2011-09-27 DIAGNOSIS — I779 Disorder of arteries and arterioles, unspecified: Secondary | ICD-10-CM

## 2011-09-27 NOTE — Assessment & Plan Note (Signed)
In part related to hypertension but now much improved.

## 2011-09-27 NOTE — Progress Notes (Signed)
CC: Difficult to control hypertension  HPI: The patient is a 75 year old female with significant vascular disease status post aortic stent graft and chronic occlusion of the right limb of the graft. She has chronic claudication. She is followed by vascular surgery. She was recently seen in the office for difficult to control severe hypertension. We had referred the patient for an MRI angiogram and a panic attack with severe hypertension. I saw her urgently in the clinic. We finally settled on a med regimen which seems to be working for now. She states that her blood pressure is much better at home and runs typically 140-150 systolic. She is also able to sleep better after we prescribed her low dose of clonazepam at night. She states now she has very infrequent headaches. She feels much improved since the last visit. She denies any chest pain or shortness of breath.  PMH: reviewed and listed in Problem List in Electronic Records (and see below)  Allergies/SH/FHX : available in Electronic Records for review  Medications: Current Outpatient Prescriptions  Medication Sig Dispense Refill  . acetaminophen (TYLENOL) 325 MG tablet Take 650 mg by mouth every 6 (six) hours as needed.        Marland Kitchen amLODipine (NORVASC) 5 MG tablet Take 5 mg by mouth daily.        Marland Kitchen aspirin 81 MG tablet Take 81 mg by mouth daily.        Marland Kitchen atorvastatin (LIPITOR) 40 MG tablet Take 20 mg by mouth at bedtime.       . calcium-vitamin D (OSCAL WITH D) 500-200 MG-UNIT per tablet Take 1 tablet by mouth 2 (two) times daily.        . clonazePAM (KLONOPIN) 0.5 MG tablet Take 1/4  By mouth in 10 am & 1/4 at 4 pm & 1/2 tab by mouth at bedtime      . cloNIDine (CATAPRES) 0.1 MG tablet Take 0.1 mg by mouth daily as needed.        . docusate sodium (COLACE) 100 MG capsule Take 100 mg by mouth 2 (two) times daily.        Marland Kitchen labetalol (NORMODYNE) 200 MG tablet Take 200 mg by mouth 2 (two) times daily.       Marland Kitchen lisinopril (PRINIVIL,ZESTRIL) 20 MG  tablet Take 20 mg by mouth every evening.        . Multiple Vitamins-Minerals (PRESERVISION/LUTEIN) CAPS Take by mouth daily.        Marland Kitchen omeprazole (PRILOSEC) 20 MG capsule Take 20 mg by mouth daily.       . polyethylene glycol (MIRALAX / GLYCOLAX) packet Take 17 g by mouth every other day.         ROS: No nausea or vomiting. No fever or chills.No melena or hematochezia.No bleeding.No claudication  Physical Exam: BP 191/98  Pulse 73  Ht 5\' 1"  (1.549 m)  Wt 123 lb (55.792 kg)  BMI 23.24 kg/m2 General: Well-nourished white female in no distress Neck: Normal carotid upstroke status post left carotid endarterectomy scar well-healed. No thyromegaly Lungs: Clear breath sounds bilaterally no wheezing Cardiac: Regular rate and rhythm with normal S1-S2 and no murmur rubs or gallops Vascular: No edema. Skin: Warm and dry  12lead ECG: Limited bedside ECHO:N/A   Assessment and Plan

## 2011-09-27 NOTE — Patient Instructions (Addendum)
Continue all current medications. Follow up - see above.  

## 2011-09-27 NOTE — Assessment & Plan Note (Signed)
Blood pressure now controlled on current medical regimen. There is also clearly a component of anxiety. This is also controlled with low-dose clonazepam. The patient has significant insomnia which is also improved. I told her that in the near future he probably should think about weaning clonazepam and substituting  a specific sleeping aid, like Remeron or trazodone.

## 2011-09-27 NOTE — Assessment & Plan Note (Signed)
Followed by vascular surgery and also followed by her primary care physician.

## 2011-09-27 NOTE — Assessment & Plan Note (Signed)
Stable followed by vascular surgery 

## 2011-10-10 ENCOUNTER — Ambulatory Visit (INDEPENDENT_AMBULATORY_CARE_PROVIDER_SITE_OTHER): Payer: Medicare Other | Admitting: Cardiology

## 2011-10-10 ENCOUNTER — Encounter: Payer: Self-pay | Admitting: Cardiology

## 2011-10-10 VITALS — BP 153/83 | HR 74 | Ht 61.0 in | Wt 123.0 lb

## 2011-10-10 DIAGNOSIS — I70219 Atherosclerosis of native arteries of extremities with intermittent claudication, unspecified extremity: Secondary | ICD-10-CM

## 2011-10-10 DIAGNOSIS — E785 Hyperlipidemia, unspecified: Secondary | ICD-10-CM

## 2011-10-10 DIAGNOSIS — I10 Essential (primary) hypertension: Secondary | ICD-10-CM

## 2011-10-10 NOTE — Patient Instructions (Signed)
Continue all current medications. Your physician wants you to follow up in:  3 months.  You will receive a reminder letter in the mail one-two months in advance.  If you don't receive a letter, please call our office to schedule the follow up appointment   

## 2011-10-10 NOTE — Progress Notes (Signed)
CC: Followup difficult to control hypertension  HPI:  Please refer to my prior office notes. The patient has significant vascular disease and is status post aortic stent graft and chronic occlusion of the right limb of the graft with chronic claudication. She is followed by vascular surgery. I have been managing the patient for difficult to control hypertension. Although she has essential hypertension I was trying to evaluate the patient for renal artery stenosis with an MRI angiogram which he suffered a panic attack and could not complete the study. In the meanwhile we have started her on small dose of clonazepam and this has much improved the patient. She feels much calmer. She has made according to her blood pressure at home and her systolic blood pressure has been typically running around 120 to 1:30 mmHg. She has been very pleased about these results. The patient actually has a smile on her face today and this is the first time in quite a while that have seen her in a good mood. She even admits that she has improved significantly. She denies any chest pain shortness of breath orthopnea PND.     PMH: reviewed and listed in Problem List in Electronic Records (and see below)  Allergies/SH/FHX : available in Electronic Records for review  Medications: Current Outpatient Prescriptions  Medication Sig Dispense Refill  . acetaminophen (TYLENOL) 325 MG tablet Take 650 mg by mouth every 6 (six) hours as needed.        Marland Kitchen amLODipine (NORVASC) 5 MG tablet Take 5 mg by mouth daily.        Marland Kitchen aspirin 81 MG tablet Take 81 mg by mouth daily.        Marland Kitchen atorvastatin (LIPITOR) 40 MG tablet Take 20 mg by mouth at bedtime.       . calcium-vitamin D (OSCAL WITH D) 500-200 MG-UNIT per tablet Take 1 tablet by mouth 2 (two) times daily.        . clonazePAM (KLONOPIN) 0.5 MG tablet Take 1/4  By mouth in 10 am & 1/4 at 4 pm & 1/2 tab by mouth at bedtime      . cloNIDine (CATAPRES) 0.1 MG tablet Take 0.1 mg by mouth  daily as needed.        . docusate sodium (COLACE) 100 MG capsule Take 100 mg by mouth 2 (two) times daily.        Marland Kitchen labetalol (NORMODYNE) 200 MG tablet Take 200 mg by mouth 2 (two) times daily.       Marland Kitchen lisinopril (PRINIVIL,ZESTRIL) 20 MG tablet Take 20 mg by mouth every evening.        . Multiple Vitamins-Minerals (PRESERVISION/LUTEIN) CAPS Take 1 capsule by mouth daily.       Marland Kitchen omeprazole (PRILOSEC) 20 MG capsule Take 20 mg by mouth daily.       . polyethylene glycol (MIRALAX / GLYCOLAX) packet Take 17 g by mouth every other day.         ROS: No nausea or vomiting. No fever or chills.No melena or hematochezia.No bleeding.No claudication  Physical Exam: BP 153/83  Pulse 74  Ht 5\' 1"  (1.549 m)  Wt 123 lb (55.792 kg)  BMI 23.24 kg/m2 Physical exam was not performed today not available  12lead ECG: Limited bedside ECHO:N/A   Assessment and Plan

## 2011-10-10 NOTE — Assessment & Plan Note (Signed)
Continue follow-up with vascular surgery

## 2011-10-10 NOTE — Assessment & Plan Note (Signed)
Blood pressure well controlled now. The patient is on an ACE inhibitor, beta blocker clonidine and a calcium channel blocker. In large part her hypertension is driven by an anxiety disorder. She has responded well to clonazepam which we will continue for short-term. During the next visit we want to consider the addition of an SSRI. The patient also further discuss this with her primary care physician.

## 2011-10-10 NOTE — Assessment & Plan Note (Signed)
Follow up with primary care physician

## 2011-10-11 ENCOUNTER — Other Ambulatory Visit: Payer: Self-pay | Admitting: *Deleted

## 2011-10-11 MED ORDER — AMLODIPINE BESYLATE 5 MG PO TABS: 5.0000 mg | ORAL_TABLET | Freq: Every day | ORAL | Status: DC

## 2011-11-26 ENCOUNTER — Telehealth: Payer: Self-pay | Admitting: *Deleted

## 2011-11-26 NOTE — Telephone Encounter (Signed)
Patient & husband walked into office wanting me to make MD aware that she is having CT Scan tomorrow at [facility] at 2:00 - scheduled by Dr. Sherril Croon.  Stated she had been back in hospital again & needed CT scan before her OV with you (2/26).  Stated you told her that when she had her scan that you would be there with her.  Advised her that I would notify MD of this & depending on his schedule tomorrow, he may or may not be able to do this.  They verbalized understanding.

## 2011-11-26 NOTE — Telephone Encounter (Signed)
She can be pretreated with Valium 5 mg 30 min. Before procedure if needed

## 2011-11-27 NOTE — Telephone Encounter (Signed)
Message left on voice mail this morning that plans had changed & that she was not having renal CT done today.  Please disregard previous message.

## 2011-12-03 NOTE — Telephone Encounter (Signed)
There is no rush as long as she feels good and BP remains under reasonable control.

## 2012-01-08 ENCOUNTER — Encounter: Payer: Self-pay | Admitting: *Deleted

## 2012-01-08 ENCOUNTER — Other Ambulatory Visit: Payer: Self-pay | Admitting: *Deleted

## 2012-01-08 ENCOUNTER — Ambulatory Visit (INDEPENDENT_AMBULATORY_CARE_PROVIDER_SITE_OTHER): Payer: Medicare Other | Admitting: Cardiology

## 2012-01-08 ENCOUNTER — Encounter (HOSPITAL_COMMUNITY): Payer: Self-pay | Admitting: Pharmacy Technician

## 2012-01-08 ENCOUNTER — Encounter: Payer: Self-pay | Admitting: Cardiology

## 2012-01-08 VITALS — BP 140/72 | HR 75 | Resp 18 | Ht 63.0 in | Wt 121.1 lb

## 2012-01-08 DIAGNOSIS — R0602 Shortness of breath: Secondary | ICD-10-CM

## 2012-01-08 DIAGNOSIS — I714 Abdominal aortic aneurysm, without rupture: Secondary | ICD-10-CM

## 2012-01-08 DIAGNOSIS — IMO0001 Reserved for inherently not codable concepts without codable children: Secondary | ICD-10-CM | POA: Insufficient documentation

## 2012-01-08 DIAGNOSIS — I701 Atherosclerosis of renal artery: Secondary | ICD-10-CM

## 2012-01-08 DIAGNOSIS — I739 Peripheral vascular disease, unspecified: Secondary | ICD-10-CM

## 2012-01-08 DIAGNOSIS — I10 Essential (primary) hypertension: Secondary | ICD-10-CM

## 2012-01-08 NOTE — Progress Notes (Signed)
[name] Bottoms, MD, [facility] Board Certified in Adult Cardiovascular Medicine,Internal Medicine and Critical Care Medicine    CC: followup patient with difficult to control hypertension  HPI:  The patient is a 76 year old female status post aortic stent graft repair for penetrating ulcer of the abdominal aorta with residual chronic occlusion of the right limb of the graft with chronic claudication followed by Dr. Arbie Cookey.  The patient continues to have difficult to control blood pressure.  She was hospitalized a month ago severe hypertension which then again rapidly normalized.  She actually has stayed on same medical regimen.  She denies any chest pain or shortness of breath at rest.  The patient generally feels low in energy and still remains very anxious.  She gives a meticulous record of her blood pressure readings.  When her blood pressure greater than 1 70 mmHg she takes an extra clonidine.  She states she has not had to do this within the last month.  She also has has stopped her and clonazepam.  She stated it was making her too drowsy.  The patient complains of chronic constipation for which she uses MiraLax.  She also has chronic left lower quadrant pain.I have asked her to address this with her primary care physician. She denies any orthopnea or PND palpitations or syncope.  PMH: reviewed and listed in Problem List in Electronic Records (and see below) Past Medical History  Diagnosis Date  . Hypertension     pdifficult to control on multiple medications  . Arrhythmia     tachypalpitations cardiac moniter time 2 evaluated by Dr.Kline in june 2008 atrial tachycardia ruled out,adrenaline or excess adrenergic sensitivity ruled out  . Normal nuclear stress test     10/2007staus post cardiac cath in the mid 90s nonobstructive coronary artery disease  . Diabetes mellitus     per patient diet control  . History of migraine headaches     chronic  . Macular degeneration   . Left lower  quadrant pain     not resolved post stenting aortic penetrating ulcer distal aortic pentrating ulcer treated with endograft 07/2008  . Anxiety disorder     probable somatic anxiety disorder  . Diverticulosis     left sided noted on colonoscopy 02/2006  . Chronic constipation     secondary to verapamil but present priot to use  . Esophageal reflux disease   . Allergy to ACE inhibitors     questionable cough on ace inhibitors currently tolerating  . Chronic low back pain     lumbosacral spine changes were noted in MRI scan with left radiculopathy 12/2004  . Hyperlipidemia   . Hiatal hernia   . Arthritis   . Dizziness   . Anxiety   . Abdominal aortic aneurysm     Complex ulcerated plaque of the infrarenal abdominal aorta mild renal artery stenosis on the right side, normal left renal artery catheterization 2009, status post aortic stent graft  . Claudication     right leg secondary to chronic occlusion of the right limb of the aortic stent graft.   Past Surgical History  Procedure Date  . Carotid endarterectomy   . Right trigeminal neuralgia   . Atherosclerotic ulcer     penetrating atherosclerotic ulcer infrarenal aorta  . Endologix powerlink bifurcated      covered stent graft  . Abdominal hysterectomy   . Tonsillectomy     Allergies/SH/FHX : available in Electronic Records for review  Allergies  Allergen Reactions  .  Cefixime     REACTION: short of breath  . Codeine     REACTION: dizziness  . Conjugated Estrogens     REACTION: flushing  . Iohexol      Desc: pt has anxiety and an irregular heartbeat. contrast exacerbates this. pt was very uncomfortable after a 20cc bolus for an miroi. we did w/o iv 11/09 per dr. Eppie Gibson. pt will talk w/ md about this and will probably not get iv contrast in the future at her re, Onset Date: [id number]   . Morphine And Related     Morphine injection Neck pain  . Adenosine Other (See Comments)    Elevated heart rate when doing stress test    History   Social History  . Marital Status: Married    Spouse Name: N/A    Number of Children: N/A  . Years of Education: N/A   Occupational History  . 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 Smoking status: Former Smoker -- 0.2 packs/day for 1 years    Types: Cigarettes    Quit date: 11/13/1979  . Smokeless tobacco: Never Used   Comment: smoked for 1 year  . Alcohol Use: No  . Drug Use: No  . Sexually Active: Not on file   Other Topics Concern  . Not on file   Social History Narrative  . No narrative on file   Family History  Problem Relation Age of Onset  . Heart failure Mother   . Coronary artery disease Mother   . Heart disease Mother   . Heart attack Father   . Heart disease Father   . Heart disease Brother     Medications: Current Outpatient Prescriptions  Medication Sig Dispense Refill  . atorvastatin (LIPITOR) 40 MG tablet Take 20 mg by mouth at bedtime.       . calcium-vitamin D (OSCAL WITH D) 500-200 MG-UNIT per tablet Take 1 tablet by mouth daily.       . clonazePAM (KLONOPIN) 0.5 MG tablet Take 0.25 mg by mouth at bedtime.       . cloNIDine (CATAPRES) 0.1 MG tablet Take 0.1 mg by mouth daily as needed. For blood pressure over 170      . docusate sodium (COLACE) 100 MG capsule Take 100 mg by mouth 2 (two) times daily.        Marland Kitchen labetalol (NORMODYNE) 200 MG tablet Take 200 mg by mouth 2 (two) times daily.       Marland Kitchen lisinopril (PRINIVIL,ZESTRIL) 20 MG tablet Take 20 mg by mouth every evening.       . Multiple Vitamins-Minerals (PRESERVISION/LUTEIN) CAPS Take 1 capsule by mouth 2 (two) times daily.       Marland Kitchen omeprazole (PRILOSEC) 20 MG capsule Take 20 mg by mouth 2 (two) times daily.       . polyethylene glycol (MIRALAX / GLYCOLAX) packet Take 17 g by mouth every other day.       Marland Kitchen acetaminophen (TYLENOL) 325 MG tablet Take 650 mg by mouth every 6 (six) hours as needed. For pain.      Marland Kitchen amLODipine (NORVASC) 5 MG tablet Take 5 mg by  mouth every morning.      Marland Kitchen aspirin EC 81 MG tablet Take 81 mg by mouth daily.        ROS: No nausea or vomiting. No fever or chills.No melena or hematochezia.No bleeding.No claudication  Physical Exam: BP 140/72  Pulse 75  Resp  18  Ht 5\' 3"  (1.6 m)  Wt 121 lb 1.9 oz (54.94 kg)  BMI 21.46 kg/m2  SpO2 98% General:well-nourished white female in no distress somewhat anxious Neck:normal carotid upstroke status post left carotid endarterectomy and left carotid bruit.  No thyromegaly.  Nonnodular thyroid.  JVP is 6 cm Lungs:clear breath sounds bilaterally.  No wheezing. Cardiac:regular rate and rhythm with normal S1 and S2.  No murmurs rubs or gallops Vascular:no edema.  Unable to palpate distal pulses in the right side.  Normal dorsalis pedis and posterior tibial pulse on the left Skin:warm and dry Physcologic:normal affect  12lead ECG: Limited bedside ECHO:N/A No images are attached to the encounter.  Counseling was provided regarding the current medical condition and included: . Diagnosis, impressions, prognosis, recommended diagnostic studies  . Risks and benefits of treatment options  . Instructions for management, treatment and/or follow-up care  . Importance of compliance with treatment, risk factor reduction  . Patient and/or family education    Time spent counseling was 25  minutes and recorded in the Problem List.   Patient Active Problem List  Diagnoses  . DYSLIPIDEMIA  . GENERALIZED ANXIETY DISORDER  . CHRONIC TENSION TYPE HEADACHE  . BEN HTN HEART DISEASE WITHOUT HEART FAIL  . ATHEROSLERO NATV ART EXTREM W/INTERMIT CLAUDICAT  . UNSPECIFIED TACHYCARDIA  . Carotid artery diseasenext status post carotid endarterectomy  . Hypertension-very difficult to control history of mild right renal artery stenosis  . Claudication-secondary to limb occlusion of the endograft Y graft to the right side  . Abdominal aortic aneurysm status post endograft repair for a penetrating  ulcer  . Normal nuclear stress test    PLAN   The patient's blood pressure continues to be difficult to control however for most of the time her blood pressure within normal range.  The patient in the past has been worked up for secondary causes of hypertension.  She will have spikes in her blood pressures up to 240 mmHg.  She was in the emergency room and admitted last month for similar episode.  She is on a multidrug regimen for hypertension.  I told the patient today Clonazepam and 0.1 mg of clonidine if her blood pressures greater than 170-1 80 mmHg.  She can recheck her blood pressure and one hour and if it remains elevated she can take labetalol by mouth x1  The patient 2009 has been ruled out for significant renal artery stenosis at this point I do think given her aggressive vascular disease we should reevaluate her for renal artery stenosis.  The patient has been unable to do a CT scan due to anxiety.  At the long discussion with the patient as well as Dr. Kirke Corin to proceed wit abdominal angiography and selective renal angiography to reassess the possibility of renal artery stenosis in the hope of controlling better her hypertension.

## 2012-01-08 NOTE — Patient Instructions (Addendum)
Your physician recommends that you schedule a follow-up appointment in: 6 months. You will receive a reminder letter in the mail about 1-2 months in advance reminding you to call our office and schedule your appointment. If you don't receive this letter, please contact our office.  You will be having an abdominal aortagram and renal angiography on March 6th @8 :30 am. Please refer to your instruction sheet for details.  Your physician recommends that you continue on your current medications as directed. Please refer to the Current Medication list given to you today.   Your physician recommends that you return for lab work this week no later than Friday at the [facility]. These are labs for your procedure next week.

## 2012-01-09 ENCOUNTER — Telehealth: Payer: Self-pay | Admitting: *Deleted

## 2012-01-09 NOTE — Telephone Encounter (Signed)
No precert required.  Medicare,Tricare

## 2012-01-09 NOTE — Telephone Encounter (Signed)
Spoke with patient and she was was contemplating about cancelling her appointment for next week because of her left side pain, also saying her husband is sick and she may not have anyone to take her that day. Patient also stated that there may be bad weather the middle of next week and she felt that she needed to cancel this appointment. Nurse informed patient that it was very important to have this test done so that this could rule out a problem that would be causing her BP to stay elevated and that if she needed to cancel due to bad weather, she could call that morning. Nurse also informed patient that she should go ahead and see her PCP this week or early next week to evaluate her left side problem. Patient said she would not cancel at this time and verbalized understanding of our discussion.

## 2012-01-09 NOTE — Telephone Encounter (Signed)
Abdominal aortagram & Renal Angiography 01-16-2012 @ [facility] with Dr. Kirke Corin DX   Renal artery stenosis Checking percert

## 2012-01-09 NOTE — Telephone Encounter (Addendum)
Message left on nurse's voicemail that patient is having this pain in her left side and wants to know if she should have something done about this before having her procedure next Wednesday on the 6th. Nurse called patient's home number and left message on machine that call was returned. After reviewing patient's office note from yesterday, patient was asked to address this issue with her primary care doctor. Awaiting call back from patient.

## 2012-01-10 LAB — PROTIME-INR

## 2012-01-11 ENCOUNTER — Encounter (INDEPENDENT_AMBULATORY_CARE_PROVIDER_SITE_OTHER): Payer: Self-pay | Admitting: *Deleted

## 2012-01-14 ENCOUNTER — Telehealth: Payer: Self-pay | Admitting: *Deleted

## 2012-01-14 NOTE — Telephone Encounter (Signed)
See if she wants to reschedule to next week on Wed.

## 2012-01-14 NOTE — Telephone Encounter (Signed)
Patient stated she has to cancel because her husband will not drive her in the weather on Wednesday and due to the prediction of the forecast for Wednesday she is cancelling her appointment. Nurse advised patient that MD would be notified and she needed to contact our office when she is ready to reschedule. Nurse called and spoke with Olegario Messier and cancelled appointment for the 6th March with Dr. Kirke Corin.

## 2012-01-14 NOTE — Telephone Encounter (Signed)
Per Dr. Kirke Corin - see if she wants to reschedule.

## 2012-01-16 ENCOUNTER — Encounter (HOSPITAL_COMMUNITY): Payer: Self-pay

## 2012-01-16 ENCOUNTER — Ambulatory Visit (HOSPITAL_COMMUNITY): Admit: 2012-01-16 | Payer: Medicare Other | Admitting: Cardiovascular Disease

## 2012-01-16 SURGERY — ABDOMINAL ANGIOGRAM
Anesthesia: LOCAL

## 2012-01-16 NOTE — Telephone Encounter (Signed)
Left message for patient to call office.  

## 2012-01-22 ENCOUNTER — Encounter: Payer: Self-pay | Admitting: *Deleted

## 2012-01-22 NOTE — Telephone Encounter (Signed)
Call never returned by patient.

## 2012-01-23 ENCOUNTER — Telehealth: Payer: Self-pay | Admitting: *Deleted

## 2012-01-23 NOTE — Telephone Encounter (Signed)
ABDOMINAL ANGIOGRAM  & RENAL ANGIOGRAM

## 2012-01-24 ENCOUNTER — Telehealth: Payer: Self-pay | Admitting: *Deleted

## 2012-01-24 NOTE — Telephone Encounter (Signed)
Patient is ready to schedule test below.  Will call back when done.

## 2012-01-24 NOTE — Telephone Encounter (Signed)
Message left on voice mail - Norvasc not agreeing with her.   Returned call - states the Norvasc 5mg  every morning - is giving her an awful headache & dizziness.  Yesterday, felt really bad.  Did not take this morning & so far feeling better.  Still taking Labetalol twice a day  & Lisinopril every evening.  She states BP doing okay for now.  She did try taking in the evening on one day, but BP was up by the evening time.

## 2012-01-25 ENCOUNTER — Telehealth: Payer: Self-pay | Admitting: *Deleted

## 2012-01-25 NOTE — Telephone Encounter (Signed)
Patient notified that test rescheduled for Wednesday, April 3 at 11:30 a.m.  She will arrive at Short Stay at 8:30 a.m.  Will mail her instruction sheet with updated time.

## 2012-01-25 NOTE — Telephone Encounter (Signed)
Pt has Medicare and Tricare, no precert required. °

## 2012-01-25 NOTE — Telephone Encounter (Signed)
She can hold the Norvasc or try to take a little bit later in the day. I would try to discourage her from to make him too many changes however

## 2012-01-25 NOTE — Telephone Encounter (Signed)
bdominal & renal angiogram rescheduled for 4/3 at 11:30 with  Dr. Kirke Corin.  Just to verify that this procedure does not require percert. Thanks.

## 2012-01-25 NOTE — Telephone Encounter (Signed)
Patient notified and verbalized understanding.  States she is not currently taking the Norvasc & feels okay.  Advised her to continue to monitor blood pressure & notify office with any concerns.

## 2012-01-29 DIAGNOSIS — I10 Essential (primary) hypertension: Secondary | ICD-10-CM

## 2012-01-29 DIAGNOSIS — R079 Chest pain, unspecified: Secondary | ICD-10-CM

## 2012-01-31 ENCOUNTER — Encounter (INDEPENDENT_AMBULATORY_CARE_PROVIDER_SITE_OTHER): Payer: Self-pay | Admitting: Internal Medicine

## 2012-01-31 ENCOUNTER — Other Ambulatory Visit (INDEPENDENT_AMBULATORY_CARE_PROVIDER_SITE_OTHER): Payer: Self-pay | Admitting: *Deleted

## 2012-01-31 ENCOUNTER — Encounter (INDEPENDENT_AMBULATORY_CARE_PROVIDER_SITE_OTHER): Payer: Self-pay

## 2012-01-31 ENCOUNTER — Telehealth (INDEPENDENT_AMBULATORY_CARE_PROVIDER_SITE_OTHER): Payer: Self-pay | Admitting: *Deleted

## 2012-01-31 ENCOUNTER — Ambulatory Visit (INDEPENDENT_AMBULATORY_CARE_PROVIDER_SITE_OTHER): Payer: Medicare Other | Admitting: Internal Medicine

## 2012-01-31 DIAGNOSIS — R1032 Left lower quadrant pain: Secondary | ICD-10-CM

## 2012-01-31 DIAGNOSIS — Z1211 Encounter for screening for malignant neoplasm of colon: Secondary | ICD-10-CM

## 2012-01-31 MED ORDER — PEG-KCL-NACL-NASULF-NA ASC-C 100 G PO SOLR: 1.0000 | Freq: Once | ORAL | Status: DC

## 2012-01-31 NOTE — Telephone Encounter (Signed)
Patient needs movi prep 

## 2012-01-31 NOTE — Progress Notes (Signed)
Subjective:     Patient ID: [name], female   DOB: 1936/09/02, 76 y.o.   MRN: [id number]  [name] is a 76 yr old female referred to our office by Dr. Sherril Croon for abdominal pain for years requesting colonosocpy. Sometimes the pain will radiate into her back.  She says there is usually tenderness all the time. No severe for over 6-8 months. Appetite good. No unintentional weight loss.  She does have some acid reflux and takes Prilosec BID.  She usually has a BM daily as long as she takes Miralax.  She denies hx of diverticulitis.  She is scheduled for a CT angio of abdomen/pelvis 02/13/2012 Colonoscopy 02/20/2006:Diverticular openings were present in the descending and sigmoid colon without evidence of diverticulitis, stricture, or bleeding. Mucosa of colon was otherwise normal with intact submucosal vascularity and without evidence of friability, inflammation, irritation or AVM. J maneuver rectum looked normal. ( Dr. Sherin Quarry)  06/16/2008 CT abdomen/pelvis with CM: Penetrating ulcer or ulcerated plaque in the infrarenal abdominal aortal. Prominent stool in the abdominal segments of the colon.  No evidence for diverticulitis. No CT evident to explain this patient history of left lowerquadrant pain.  Review of Systems see hpi Current Outpatient Prescriptions  Medication Sig Dispense Refill  . acetaminophen (TYLENOL) 325 MG tablet Take 650 mg by mouth every 6 (six) hours as needed. For pain.      Marland Kitchen amLODipine (NORVASC) 5 MG tablet Take 5 mg by mouth every morning.      Marland Kitchen aspirin EC 81 MG tablet Take 81 mg by mouth daily.      Marland Kitchen atorvastatin (LIPITOR) 40 MG tablet Take 20 mg by mouth at bedtime.       . calcium-vitamin D (OSCAL WITH D) 500-200 MG-UNIT per tablet Take 1 tablet by mouth daily.       . clonazePAM (KLONOPIN) 0.5 MG tablet Take 0.25 mg by mouth at bedtime.       . cloNIDine (CATAPRES) 0.1 MG tablet Take 0.1 mg by mouth daily as needed. For blood pressure over 170      . docusate sodium  (COLACE) 100 MG capsule Take 100 mg by mouth 2 (two) times daily.        Marland Kitchen labetalol (NORMODYNE) 200 MG tablet Take 200 mg by mouth 2 (two) times daily.       Marland Kitchen lisinopril (PRINIVIL,ZESTRIL) 20 MG tablet Take 20 mg by mouth every evening.       . Multiple Vitamins-Minerals (PRESERVISION/LUTEIN) CAPS Take 1 capsule by mouth 2 (two) times daily.       Marland Kitchen omeprazole (PRILOSEC) 20 MG capsule Take 20 mg by mouth 2 (two) times daily.       . polyethylene glycol (MIRALAX / GLYCOLAX) packet Take 17 g by mouth every other day.        Past Medical History  Diagnosis Date  . Hypertension     pdifficult to control on multiple medications  . Arrhythmia     tachypalpitations cardiac moniter time 2 evaluated by Dr.Kline in june 2008 atrial tachycardia ruled out,adrenaline or excess adrenergic sensitivity ruled out  . Normal nuclear stress test     10/2007staus post cardiac cath in the mid 90s nonobstructive coronary artery disease  . Diabetes mellitus     per patient diet control  . History of migraine headaches     chronic  . Macular degeneration   . Left lower quadrant pain     not resolved post stenting aortic penetrating ulcer  distal aortic pentrating ulcer treated with endograft 07/2008  . Anxiety disorder     probable somatic anxiety disorder  . Diverticulosis     left sided noted on colonoscopy 02/2006  . Chronic constipation     secondary to verapamil but present priot to use  . Esophageal reflux disease   . Allergy to ACE inhibitors     questionable cough on ace inhibitors currently tolerating  . Chronic low back pain     lumbosacral spine changes were noted in MRI scan with left radiculopathy 12/2004  . Hyperlipidemia   . Hiatal hernia   . Arthritis   . Dizziness   . Anxiety   . Abdominal aortic aneurysm     Complex ulcerated plaque of the infrarenal abdominal aorta mild renal artery stenosis on the right side, normal left renal artery catheterization 2009, status post aortic stent  graft  . Claudication     right leg secondary to chronic occlusion of the right limb of the aortic stent graft.   History   Social History  . Marital Status: Married    Spouse Name: N/A    Number of Children: N/A  . Years of Education: N/A   Occupational History  . 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 Smoking status: Former Smoker -- 0.2 packs/day for 1 years    Types: Cigarettes    Quit date: 11/13/1979  . Smokeless tobacco: Never Used   Comment: smoked for 1 year  . Alcohol Use: No  . Drug Use: No  . Sexually Active: Not on file   Other Topics Concern  . Not on file   Social History Narrative  . No narrative on file   Family Status  Relation Status Death Age  . Mother Deceased 59  . Father Deceased 14  . Sister Deceased   . Brother Deceased   . Sister Deceased   . Sister Deceased   . Brother Deceased   . Brother Deceased    Past Surgical History  Procedure Date  . Carotid endarterectomy   . Right trigeminal neuralgia   . Atherosclerotic ulcer     penetrating atherosclerotic ulcer infrarenal aorta  . Endologix powerlink bifurcated      covered stent graft  . Abdominal hysterectomy   . Tonsillectomy    Allergies  Allergen Reactions  . Cefixime     REACTION: short of breath  . Codeine     REACTION: dizziness  . Conjugated Estrogens     REACTION: flushing  . Iohexol      Desc: pt has anxiety and an irregular heartbeat. contrast exacerbates this. pt was very uncomfortable after a 20cc bolus for an miroi. we did w/o iv 11/09 per dr. Eppie Gibson. pt will talk w/ md about this and will probably not get iv contrast in the future at her re, Onset Date: [id number]   . Morphine And Related     Morphine injection Neck pain  . Adenosine Other (See Comments)    Elevated heart rate when doing stress test      Objective:   Physical Exam Filed Vitals:   01/31/12 1103  Height: 5' 1.5" (1.562 m)  Weight: 120 lb 4.8 oz (54.568 kg)   Alert  and oriented. Skin warm and dry. Oral mucosa is moist.   . Sclera anicteric, conjunctivae is pink. Thyroid not enlarged. No cervical lymphadenopathy. Lungs clear. Heart regular rate and rhythm.  Abdomen is soft. Bowel sounds  are positive. No hepatomegaly. No abdominal masses felt. No tenderness.  No edema to lower extremities. Patient is alert and oriented.      Assessment:   Left lower abdominal pain. Diverticular disease needs to be ruled out. She is asymptomatic at this time.  She would like to wait to schedule colonoscopy till after her CT angio abdomin/pelvis.    Plan:    Colonoscopy with Dr Karilyn Cota.  Patient would like to delay till after her CT scan is complete.

## 2012-01-31 NOTE — Patient Instructions (Signed)
Colonoscopy with Dr. Rehman 

## 2012-02-12 ENCOUNTER — Other Ambulatory Visit: Payer: Self-pay | Admitting: Cardiovascular Disease

## 2012-02-12 DIAGNOSIS — I701 Atherosclerosis of renal artery: Secondary | ICD-10-CM

## 2012-02-13 ENCOUNTER — Encounter (HOSPITAL_COMMUNITY): Payer: Self-pay | Admitting: General Practice

## 2012-02-13 ENCOUNTER — Other Ambulatory Visit: Payer: Self-pay

## 2012-02-13 ENCOUNTER — Inpatient Hospital Stay (HOSPITAL_COMMUNITY)
Admission: RE | Admit: 2012-02-13 | Discharge: 2012-02-14 | DRG: 675 | Disposition: A | Discharge status: Home / Self Care | Payer: Medicare Other | Source: Ambulatory Visit | Attending: Cardiovascular Disease | Admitting: Cardiovascular Disease

## 2012-02-13 ENCOUNTER — Encounter (HOSPITAL_COMMUNITY)
Admission: RE | Disposition: A | Discharge status: Home / Self Care | Payer: Self-pay | Source: Ambulatory Visit | Attending: Cardiovascular Disease

## 2012-02-13 DIAGNOSIS — I70219 Atherosclerosis of native arteries of extremities with intermittent claudication, unspecified extremity: Secondary | ICD-10-CM | POA: Insufficient documentation

## 2012-02-13 DIAGNOSIS — Z7982 Long term (current) use of aspirin: Secondary | ICD-10-CM

## 2012-02-13 DIAGNOSIS — E7800 Pure hypercholesterolemia, unspecified: Secondary | ICD-10-CM | POA: Insufficient documentation

## 2012-02-13 DIAGNOSIS — I701 Atherosclerosis of renal artery: Principal | ICD-10-CM | POA: Diagnosis present

## 2012-02-13 DIAGNOSIS — Z87891 Personal history of nicotine dependence: Secondary | ICD-10-CM

## 2012-02-13 DIAGNOSIS — E785 Hyperlipidemia, unspecified: Secondary | ICD-10-CM | POA: Diagnosis present

## 2012-02-13 DIAGNOSIS — I10 Essential (primary) hypertension: Secondary | ICD-10-CM | POA: Diagnosis present

## 2012-02-13 DIAGNOSIS — K5900 Constipation, unspecified: Secondary | ICD-10-CM | POA: Diagnosis present

## 2012-02-13 DIAGNOSIS — E876 Hypokalemia: Secondary | ICD-10-CM | POA: Diagnosis present

## 2012-02-13 HISTORY — DX: Migraine, unspecified, not intractable, without status migrainosus: G43.909

## 2012-02-13 HISTORY — PX: RENAL ANGIOGRAM: SHX5509

## 2012-02-13 HISTORY — DX: Trigeminal neuralgia: G50.0

## 2012-02-13 HISTORY — DX: Anemia, unspecified: D64.9

## 2012-02-13 HISTORY — PX: ABDOMINAL AORTAGRAM: SHX5454

## 2012-02-13 HISTORY — DX: Atherosclerosis of renal artery: I70.1

## 2012-02-13 LAB — GLUCOSE, CAPILLARY
Glucose-Capillary: 181 mg/dL — ABNORMAL HIGH (ref 70–99)
Glucose-Capillary: 85 mg/dL (ref 70–99)

## 2012-02-13 LAB — CBC
Hemoglobin: 12.6 g/dL (ref 12.0–15.0)
MCHC: 33.2 g/dL (ref 30.0–36.0)
Platelets: 197 10*3/uL (ref 150–400)
RBC: 4.37 MIL/uL (ref 3.87–5.11)

## 2012-02-13 LAB — BASIC METABOLIC PANEL
BUN: 17 mg/dL (ref 6–23)
Calcium: 10.7 mg/dL — ABNORMAL HIGH (ref 8.4–10.5)
GFR calc Af Amer: >90 mL/min (ref >90)
GFR calc non Af Amer: 83 mL/min — ABNORMAL LOW (ref >90)
Potassium: 4 mEq/L (ref 3.5–5.1)
Sodium: 139 mEq/L (ref 135–145)

## 2012-02-13 LAB — DIFFERENTIAL
Basophils Relative: 0 % (ref 0–1)
Monocytes Relative: 7 % (ref 3–12)
Neutro Abs: 2.1 10*3/uL (ref 1.7–7.7)
Neutrophils Relative %: 63 % (ref 43–77)

## 2012-02-13 LAB — POCT ACTIVATED CLOTTING TIME
Activated Clotting Time: 210 seconds
Activated Clotting Time: 171 seconds

## 2012-02-13 LAB — PROTIME-INR
INR: 0.94 (ref 0.00–1.49)
Prothrombin Time: 12.8 seconds (ref 11.6–15.2)

## 2012-02-13 SURGERY — ABDOMINAL AORTAGRAM
Anesthesia: LOCAL | Laterality: Right

## 2012-02-13 MED ORDER — ACETAMINOPHEN 325 MG PO TABS
ORAL_TABLET | ORAL | Status: AC
  Filled 2012-02-13: qty 1

## 2012-02-13 MED ORDER — ACETAMINOPHEN 325 MG PO TABS: 650.0000 mg | ORAL_TABLET | ORAL | Status: DC | PRN

## 2012-02-13 MED ORDER — MIDAZOLAM HCL 2 MG/2ML IJ SOLN
INTRAMUSCULAR | Status: AC
  Filled 2012-02-13: qty 2

## 2012-02-13 MED ORDER — SODIUM CHLORIDE 0.9 % IJ SOLN
3.0000 mL | Freq: Two times a day (BID) | INTRAMUSCULAR | Status: DC
  Administered 2012-02-13: 3 mL via INTRAVENOUS

## 2012-02-13 MED ORDER — ASPIRIN EC 81 MG PO TBEC
81.0000 mg | DELAYED_RELEASE_TABLET | Freq: Every day | ORAL | Status: DC
  Administered 2012-02-13: 81 mg via ORAL
  Filled 2012-02-13 (×2): qty 1

## 2012-02-13 MED ORDER — ATROPINE SULFATE 0.1 MG/ML IJ SOLN
1.0000 mg | Freq: Once | INTRAMUSCULAR | Status: AC
  Administered 2012-02-13: 0.5 mg via INTRAVENOUS

## 2012-02-13 MED ORDER — OCUVITE-LUTEIN PO CAPS
1.0000 | ORAL_CAPSULE | Freq: Two times a day (BID) | ORAL | Status: DC
  Filled 2012-02-13 (×3): qty 1

## 2012-02-13 MED ORDER — POLYETHYLENE GLYCOL 3350 17 G PO PACK
17.0000 g | PACK | ORAL | Status: DC
  Filled 2012-02-13: qty 1

## 2012-02-13 MED ORDER — NITROGLYCERIN 0.4 MG SL SUBL: 0.4000 mg | SUBLINGUAL_TABLET | SUBLINGUAL | Status: DC | PRN

## 2012-02-13 MED ORDER — CLOPIDOGREL BISULFATE 75 MG PO TABS: 75.0000 mg | ORAL_TABLET | Freq: Every day | ORAL | Status: DC

## 2012-02-13 MED ORDER — FENTANYL CITRATE 0.05 MG/ML IJ SOLN
INTRAMUSCULAR | Status: AC
  Filled 2012-02-13: qty 2

## 2012-02-13 MED ORDER — PANTOPRAZOLE SODIUM 40 MG PO TBEC
40.0000 mg | DELAYED_RELEASE_TABLET | Freq: Every day | ORAL | Status: DC
  Administered 2012-02-13: 40 mg via ORAL
  Filled 2012-02-13: qty 1

## 2012-02-13 MED ORDER — CLOPIDOGREL BISULFATE 75 MG PO TABS
75.0000 mg | ORAL_TABLET | Freq: Every day | ORAL | Status: DC
  Administered 2012-02-14: 75 mg via ORAL
  Filled 2012-02-13: qty 1

## 2012-02-13 MED ORDER — ATROPINE SULFATE 1 MG/ML IJ SOLN
INTRAMUSCULAR | Status: AC
  Filled 2012-02-13: qty 1

## 2012-02-13 MED ORDER — LABETALOL HCL 200 MG PO TABS
200.0000 mg | ORAL_TABLET | Freq: Two times a day (BID) | ORAL | Status: DC
  Administered 2012-02-14: 200 mg via ORAL
  Filled 2012-02-13 (×3): qty 1

## 2012-02-13 MED ORDER — ONDANSETRON HCL 4 MG/2ML IJ SOLN
INTRAMUSCULAR | Status: AC
  Filled 2012-02-13: qty 2

## 2012-02-13 MED ORDER — TRAMADOL HCL 50 MG PO TABS
50.0000 mg | ORAL_TABLET | Freq: Four times a day (QID) | ORAL | Status: DC | PRN
  Filled 2012-02-13: qty 1

## 2012-02-13 MED ORDER — HEPARIN SODIUM (PORCINE) 1000 UNIT/ML IJ SOLN
INTRAMUSCULAR | Status: AC
  Filled 2012-02-13: qty 1

## 2012-02-13 MED ORDER — ACETAMINOPHEN 325 MG PO TABS
325.0000 mg | ORAL_TABLET | Freq: Once | ORAL | Status: AC
  Administered 2012-02-13: 325 mg via ORAL

## 2012-02-13 MED ORDER — PRESERVISION/LUTEIN PO CAPS: 1.0000 | ORAL_CAPSULE | Freq: Two times a day (BID) | ORAL | Status: DC

## 2012-02-13 MED ORDER — HEPARIN (PORCINE) IN NACL 2-0.9 UNIT/ML-% IJ SOLN
INTRAMUSCULAR | Status: AC
  Filled 2012-02-13: qty 1000

## 2012-02-13 MED ORDER — ONDANSETRON HCL 4 MG/2ML IJ SOLN: 4.0000 mg | Freq: Four times a day (QID) | INTRAMUSCULAR | Status: DC | PRN

## 2012-02-13 MED ORDER — ATORVASTATIN CALCIUM 20 MG PO TABS
20.0000 mg | ORAL_TABLET | Freq: Every day | ORAL | Status: DC
  Administered 2012-02-13: 20 mg via ORAL
  Filled 2012-02-13 (×2): qty 1

## 2012-02-13 MED ORDER — ACETAMINOPHEN 325 MG PO TABS: 650.0000 mg | ORAL_TABLET | Freq: Four times a day (QID) | ORAL | Status: DC | PRN

## 2012-02-13 MED ORDER — SODIUM CHLORIDE 0.9 % IJ SOLN: 3.0000 mL | INTRAMUSCULAR | Status: DC | PRN

## 2012-02-13 MED ORDER — AMLODIPINE BESYLATE 5 MG PO TABS
5.0000 mg | ORAL_TABLET | Freq: Every morning | ORAL | Status: DC
  Filled 2012-02-13: qty 1

## 2012-02-13 MED ORDER — LISINOPRIL 20 MG PO TABS
20.0000 mg | ORAL_TABLET | Freq: Every evening | ORAL | Status: DC
  Filled 2012-02-13 (×2): qty 1

## 2012-02-13 MED ORDER — SODIUM CHLORIDE 0.9 % IV SOLN
INTRAVENOUS | Status: DC
  Administered 2012-02-13: 100 mL/h via INTRAVENOUS

## 2012-02-13 MED ORDER — CLONIDINE HCL 0.1 MG PO TABS
0.1000 mg | ORAL_TABLET | Freq: Every day | ORAL | Status: DC | PRN
  Filled 2012-02-13: qty 1

## 2012-02-13 MED ORDER — LIDOCAINE HCL (PF) 1 % IJ SOLN
INTRAMUSCULAR | Status: AC
  Filled 2012-02-13: qty 30

## 2012-02-13 MED ORDER — SODIUM CHLORIDE 0.9 % IV SOLN: 250.0000 mL | INTRAVENOUS | Status: DC | PRN

## 2012-02-13 MED ORDER — CALCIUM CARBONATE-VITAMIN D 500-200 MG-UNIT PO TABS
1.0000 | ORAL_TABLET | Freq: Every day | ORAL | Status: DC
  Filled 2012-02-13 (×2): qty 1

## 2012-02-13 MED ORDER — ZOLPIDEM TARTRATE 5 MG PO TABS: 5.0000 mg | ORAL_TABLET | Freq: Every evening | ORAL | Status: DC | PRN

## 2012-02-13 MED ORDER — CLONAZEPAM 0.5 MG PO TABS
0.2500 mg | ORAL_TABLET | Freq: Every day | ORAL | Status: DC
  Administered 2012-02-13: 0.25 mg via ORAL
  Filled 2012-02-13 (×2): qty 1

## 2012-02-13 MED ORDER — SODIUM CHLORIDE 0.9 % IV SOLN: INTRAVENOUS | Status: AC

## 2012-02-13 MED ORDER — ASPIRIN EC 81 MG PO TBEC
81.0000 mg | DELAYED_RELEASE_TABLET | Freq: Every day | ORAL | Status: DC
Start: 2012-02-14 — End: 2012-02-14
  Administered 2012-02-14: 81 mg via ORAL
  Filled 2012-02-13: qty 1

## 2012-02-13 MED ORDER — DOCUSATE SODIUM 100 MG PO CAPS
100.0000 mg | ORAL_CAPSULE | Freq: Two times a day (BID) | ORAL | Status: DC
  Administered 2012-02-13: 100 mg via ORAL
  Filled 2012-02-13 (×3): qty 1

## 2012-02-13 MED ORDER — ONDANSETRON HCL 4 MG/2ML IJ SOLN
4.0000 mg | Freq: Three times a day (TID) | INTRAMUSCULAR | Status: DC | PRN
  Administered 2012-02-13: 4 mg via INTRAVENOUS

## 2012-02-13 MED ORDER — ALPRAZOLAM 0.25 MG PO TABS: 0.2500 mg | ORAL_TABLET | Freq: Two times a day (BID) | ORAL | Status: DC | PRN

## 2012-02-13 NOTE — H&P (Signed)
History and Physical  Patient ID: Charlann Lange Patient ID: GERTRUDE BUCKS MRN: [id number], DOB/AGE: 1936/09/23 76 y.o. Date of Encounter: 02/13/2012  Primary Physician: Ignatius Specking., MD, MD Primary Cardiologist: GD  Chief Complaint:  Difficult to control HTN  HPI: Ms Zenner is a 76 year old female followed by Dr Andee Lineman for HTN. She has been on a mult-drug regimen with poor control. She has spikes of her SBP to 240 and has been in the ER for these. Dr Andee Lineman felt she was appropriate for abdominal angiography and selective renal angiography to reassess the possibility of renal artery stenosis in the hope of controlling better her hypertension.  Pt has to take extra Rx for her BP occasionally. She is compliant with her meds. Her home SBP readings range from 110-240 but most of the time 140s-150s. She has a constant HA that gets worse when her BP is high. She has claudication symptoms in her right leg with ambulation. She has not had chest pain.   Past Medical History  Diagnosis Date  . Hypertension     pdifficult to control on multiple medications  . Arrhythmia     tachypalpitations cardiac moniter time 2 evaluated by Dr.Kline in june 2008 atrial tachycardia ruled out,adrenaline or excess adrenergic sensitivity ruled out  . Normal nuclear stress test     10/2007staus post cardiac cath in the mid 90s nonobstructive coronary artery disease  . Diabetes mellitus     per patient diet control  . History of migraine headaches     chronic  . Macular degeneration   . Left lower quadrant pain     not resolved post stenting aortic penetrating ulcer distal aortic pentrating ulcer treated with endograft 07/2008    . Anxiety disorder     probable somatic anxiety disorder  . Diverticulosis     left sided noted on colonoscopy 02/2006  . Chronic constipation     secondary to verapamil but present priot to use  . Esophageal reflux disease   . Allergy to ACE inhibitors     questionable cough on ace  inhibitors currently tolerating  . Chronic low back pain     lumbosacral spine changes were noted in MRI scan with left radiculopathy 12/2004  . Hyperlipidemia   . Hiatal hernia   . Arthritis   . Dizziness   . Anxiety   . Abdominal aortic aneurysm     Complex ulcerated plaque of the infrarenal abdominal aorta mild renal artery stenosis on the right side, normal left renal artery catheterization 2009, status post aortic stent graft  . Claudication     right leg secondary to chronic occlusion of the right limb of the aortic stent graft.     Surgical History:  Past Surgical History  Procedure Date  . Carotid endarterectomy   . Right trigeminal neuralgia   . Atherosclerotic ulcer     penetrating atherosclerotic ulcer infrarenal aorta  . Endologix powerlink bifurcated      covered stent graft  . Abdominal hysterectomy   . Tonsillectomy      I have reviewed the patient's current medications. Prescriptions prior to admission  Medication Sig Dispense Refill  . acetaminophen (TYLENOL) 325 MG tablet Take 650 mg by mouth every 6 (six) hours as needed. For pain.      Marland Kitchen amLODipine (NORVASC) 5 MG tablet Take 5 mg by mouth every morning.      Marland Kitchen aspirin EC 81 MG tablet Take 81 mg by mouth daily.      Marland Kitchen  atorvastatin (LIPITOR) 40 MG tablet Take 20 mg by mouth at bedtime.       . calcium-vitamin D (OSCAL WITH D) 500-200 MG-UNIT per tablet Take 1 tablet by mouth daily.       . clonazePAM (KLONOPIN) 0.5 MG tablet Take 0.25 mg by mouth at bedtime.       . cloNIDine (CATAPRES) 0.1 MG tablet Take 0.1 mg by mouth daily as needed. For blood pressure over 170      . docusate sodium (COLACE) 100 MG capsule Take 100 mg by mouth 2 (two) times daily.        Marland Kitchen labetalol (NORMODYNE) 200 MG tablet Take 200 mg by mouth 2 (two) times daily.       Marland Kitchen lisinopril (PRINIVIL,ZESTRIL) 20 MG tablet Take 20 mg by mouth every evening.       . Multiple Vitamins-Minerals (PRESERVISION/LUTEIN) CAPS Take 1 capsule by mouth 2  (two) times daily.       Marland Kitchen omeprazole (PRILOSEC) 20 MG capsule Take 20 mg by mouth 2 (two) times daily.       . polyethylene glycol (MIRALAX / GLYCOLAX) packet Take 17 g by mouth every other day.       . peg 3350 powder (MOVIPREP) 100 G SOLR Take 1 kit (100 g total) by mouth once.  1 kit  0    Allergies:   Allergies  Allergen Reactions  . Cefixime     REACTION: short of breath  . Codeine     REACTION: dizziness  . Conjugated Estrogens     REACTION: flushing  . Iohexol      Desc: pt has anxiety and an irregular heartbeat. contrast exacerbates this. pt was very uncomfortable after a 20cc bolus for an miroi. we did w/o iv 11/09 per dr. Eppie Gibson. pt will talk w/ md about this and will probably not get iv contrast in the future at her re, Onset Date: [id number]   . Morphine And Related     Morphine injection Neck pain  . Adenosine Other (See Comments)    Elevated heart rate when doing stress test    History   Social History  . Marital Status: Married    Spouse Name: N/A    Number of Children: N/A  . Years of Education: N/A   Occupational History  . 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 Smoking status: Former Smoker -- 0.2 packs/day for 1 years    Types: Cigarettes    Quit date: 11/13/1979  . Smokeless tobacco: Never Used   Comment: smoked for 1 year  . Alcohol Use: No  . Drug Use: No  . Sexually Active: Not on file   Family History  Problem Relation Age of Onset  . Heart failure Mother   . Coronary artery disease Mother   . Heart disease Mother   . Heart attack Father   . Heart disease Father   . Heart disease Brother     Review of Systems: No recent illnesses, fevers or chills. She has chronic LLQ abdominal pain, no cause ever determined. She has a history of diverticulosis. She denies other GI symptoms. She has regular back pain, with exertion, that she feels is because of the clotted stent. Full 14-point review of systems otherwise  negative except as noted above.   Physical Exam: Blood pressure 187/100, pulse 80, temperature 96.8 F (36 C), temperature source Oral, resp. rate 18, height 5' 1.5" (1.562 m),  weight 123 lb (55.792 kg), SpO2 98.00%. General: Well developed, well nourished, female in no acute distress. Head: Normocephalic, atraumatic, sclera non-icteric, no xanthomas, nares are without discharge. Dentition: good Neck: No carotid bruits. JVD not elevated. No thyromegally Lungs: Good expansion bilaterally, Clear bilaterally to auscultation without wheezes or rhonchi. No Rales Heart: Regular rate and rhythm with S1 S2. No S3 or S4.  No murmurs, no rubs, or gallops appreciated. Abdomen: Soft, chronically tender LLQ, non-distended with normoactive bowel sounds. No hepatomegaly. No rebound/guarding. No obvious abdominal masses. Msk:  Strength and tone appear normal for age. No joint deformities or effusions, no spine or costo-vertebral angle tenderness. Extremities: No clubbing or cyanosis. No edema.  Distal pedal pulses are 1+ lower extrem and 2+ upper extrem Neuro: Alert and oriented X 3. Moves all extremities spontaneously. No focal deficits noted. Psych:  Responds to questions appropriately with a normal affect. Skin: No rashes or lesions noted  Labs:   Lab Results  Component Value Date   WBC 3.4* 02/13/2012   HGB 12.6 02/13/2012   HCT 38.0 02/13/2012   MCV 87.0 02/13/2012   PLT 197 02/13/2012    Basename 02/13/12 0925  INR 0.94    Lab 02/13/12 0925  NA 139  K 4.0  CL 103  CO2 28  BUN 17  CREATININE 0.73  CALCIUM 10.7*  PROT --  BILITOT --  ALKPHOS --  ALT --  AST --  GLUCOSE 115*    EKG: 13-Feb-2012 08:32:28  Normal sinus rhythm Nonspecific ST abnormality Vent. rate 69 BPM PR interval 152 ms QRS duration 98 ms QT/QTc 368/394 ms P-R-T axes 40 36 60  ASSESSMENT AND PLAN:  Active Problems:  ATHEROSLERO NATV ART EXTREM W/INTERMIT CLAUDICAT  Hypertension  Plan: Abdominal and renal  angiogram today to assess for RAS.   Signed,  Bjorn Loser Alan Drummer PA-C 02/13/2012, 10:24 AM

## 2012-02-13 NOTE — H&P (Signed)
    I have seen and examined the patient. I agree with the above note. Lorine Bears MD, [facility] 02/13/2012 11:48 AM

## 2012-02-13 NOTE — Progress Notes (Signed)
REPORT CALLED AND TRANSFERRED TO 2504 VIA BED

## 2012-02-13 NOTE — CV Procedure (Signed)
    Abdominal Angiography Procedure Note  Name: [name] MRN: [id number] DOB: 09-Aug-1936  Procedure: Nonselective abdominal aortogram, selective right renal angiography, right renal artery stenting.  Indication: Refractory hypertension.     Procedural details: The left groin was prepped, draped, and anesthetized with 1% lidocaine. Using modified Seldinger technique, a 6 French sheath was introduced into the left femoral artery. Abdominal aortogram was done in the AP position with a pigtail catheter. This showed significant ostial right artery stenosis. It was decided to proceed with renal artery stenting.  Interventional procedure details: The patient was given 4000 units of unfractionated heparin ACT was 210. An additional 2000 units of heparin was given. Initially I used a JR 4 renal guide but did not fit well. This was exchanged to a renal LIMA guide. Selective renal angiography was performed. The lesion was passed with a Sparta core wire easily. I then placed a 5 x 15 mm Genesis stent and deployed it to 10 atmosphere. The balloon was then withdrawn and inflated again flared the ostium to 12 atmospheres. Angiography showed good results with only minimal ostial stenosis. The patient tolerated the procedure well with no immediate complications. The sheath would be more removed manually once ACT is down.   Procedural Findings:  Abdominal aortogram: This showed single left renal artery which is patent without significant disease or stenosis. On the right side, there are 2 renal arteries. Superior one is relatively small and free of significant disease. The lower right renal artery supplies most of the right kidney and has a 90% ulcerated plaque at the ostium. The rest of the vessel is free of significant disease. A stent graft is noted in the distal aorta with 2 limbs going to the common iliac arteries bilaterally. The left side is patent. However, the stent is occluded at the proximal right  common iliac artery which is a chronic finding.  Final Conclusions:  1. Significant ostial right renal artery stenosis. 2. Known occluded right common iliac artery at the previously placed stent the graft. 3. Successful stenting of ostial right renal artery stenosis.  Recommendations:  I recommend Plavix for 30 days. Continue aggressive treatment of hypertension.  Lorine Bears MD, [facility] 02/13/2012, 12:55 PM

## 2012-02-13 NOTE — Interval H&P Note (Signed)
History and Physical Interval Note:  02/13/2012 11:48 AM  [name]  has presented today for surgery, with the diagnosis of Renal artery stenosis  The various methods of treatment have been discussed with the patient and family. After consideration of risks, benefits and other options for treatment, the patient has consented to  Procedure(s) (LRB): ABDOMINAL AORTAGRAM (N/A) RENAL ANGIOGRAM (N/A) as a surgical intervention .  The patients' history has been reviewed, patient examined, no change in status, stable for surgery.  I have reviewed the patients' chart and labs.  Questions were answered to the patient's satisfaction.     Lorine Bears

## 2012-02-14 ENCOUNTER — Encounter (HOSPITAL_COMMUNITY): Payer: Self-pay | Admitting: Physician Assistant

## 2012-02-14 DIAGNOSIS — I701 Atherosclerosis of renal artery: Principal | ICD-10-CM

## 2012-02-14 DIAGNOSIS — I10 Essential (primary) hypertension: Secondary | ICD-10-CM

## 2012-02-14 LAB — BASIC METABOLIC PANEL
Calcium: 9.8 mg/dL (ref 8.4–10.5)
GFR calc non Af Amer: 66 mL/min — ABNORMAL LOW (ref >90)
Glucose, Bld: 98 mg/dL (ref 70–99)
Potassium: 3.6 mEq/L (ref 3.5–5.1)
Sodium: 138 mEq/L (ref 135–145)

## 2012-02-14 LAB — GLUCOSE, CAPILLARY: Glucose-Capillary: 158 mg/dL — ABNORMAL HIGH (ref 70–99)

## 2012-02-14 LAB — CBC
Hemoglobin: 11.1 g/dL — ABNORMAL LOW (ref 12.0–15.0)
MCHC: 34.2 g/dL (ref 30.0–36.0)
Platelets: 191 10*3/uL (ref 150–400)
RBC: 3.76 MIL/uL — ABNORMAL LOW (ref 3.87–5.11)

## 2012-02-14 MED ORDER — POTASSIUM CHLORIDE CRYS ER 20 MEQ PO TBCR
40.0000 meq | EXTENDED_RELEASE_TABLET | Freq: Once | ORAL | Status: AC
  Administered 2012-02-14: 40 meq via ORAL
  Filled 2012-02-14: qty 2

## 2012-02-14 NOTE — Progress Notes (Signed)
UR Completed. Simmons, Jacyln Carmer F [phone]  

## 2012-02-14 NOTE — Discharge Instructions (Signed)
PLEASE REMEMBER TO BRING ALL OF YOUR MEDICATIONS TO EACH OF YOUR FOLLOW-UP OFFICE VISITS.   WE ARE STOPPING THE LISINOPRIL, PLEASE CALL THE OFFICE IF YOU NOTICE AN INCREASE IN YOUR BLOOD PRESSURE.   PLEASE ATTEND ALL FOLLOW-UP APPOINTMENTS.    Groin Site Care Refer to this sheet in the next few weeks. These instructions provide you with information on caring for yourself after your procedure. Your caregiver may also give you more specific instructions. Your treatment has been planned according to current medical practices, but problems sometimes occur. Call your caregiver if you have any problems or questions after your procedure. HOME CARE INSTRUCTIONS  You may shower 24 hours after the procedure. Remove the bandage (dressing) and gently wash the site with plain soap and water. Gently pat the site dry.   Do not apply powder or lotion to the site.   Do not sit in a bathtub, swimming pool, or whirlpool for 5 to 7 days.   No bending, squatting, or lifting anything over 10 pounds (4.5 kg) as directed by your caregiver.   Inspect the site at least twice daily.   Do not drive home if you are discharged the same day of the procedure. Have someone else drive you.   You may drive 24 hours after the procedure unless otherwise instructed by your caregiver.  What to expect:  Any bruising will usually fade within 1 to 2 weeks.   Blood that collects in the tissue (hematoma) may be painful to the touch. It should usually decrease in size and tenderness within 1 to 2 weeks.  SEEK IMMEDIATE MEDICAL CARE IF:  You have unusual pain at the groin site or down the affected leg.   You have redness, warmth, swelling, or pain at the groin site.   You have drainage (other than a small amount of blood on the dressing).   You have chills.   You have a fever or persistent symptoms for more than 72 hours.   You have a fever and your symptoms suddenly get worse.   Your leg becomes pale, cool, tingly, or  numb.   You have heavy bleeding from the site. Hold pressure on the site.  Document Released: 12/01/2010 Document Revised: 10/18/2011 Document Reviewed: 12/01/2010 [facility] Patient Information 2012 [place], [place].

## 2012-02-14 NOTE — Progress Notes (Signed)
    SUBJECTIVE: NO complaints. NO chest pain, SOB.   BP 119/66  Pulse 59  Temp(Src) 97.6 F (36.4 C) (Oral)  Resp 20  Ht 5' 1.5" (1.562 m)  Wt 118 lb 9.7 oz (53.8 kg)  BMI 22.05 kg/m2  SpO2 98%  Intake/Output Summary (Last 24 hours) at 02/14/12 0711 Last data filed at 02/13/12 2142  Gross per 24 hour  Intake    420 ml  Output      0 ml  Net    420 ml    PHYSICAL EXAM General: Well developed, well nourished, in no acute distress. Alert and oriented x 3.  Psych:  Good affect, responds appropriately Neck: No JVD. No masses noted.  Lungs: Clear bilaterally with no wheezes or rhonci noted.  Heart: RRR with no murmurs noted. Abdomen: Bowel sounds are present. Soft, non-tender.  Extremities: No lower extremity edema. Left groin cath site ok.   LABS: Basic Metabolic Panel:  Basename 02/14/12 0410 02/13/12 0925  NA 138 139  K 3.6 4.0  CL 103 103  CO2 26 28  GLUCOSE 98 115*  BUN 17 17  CREATININE 0.85 0.73  CALCIUM 9.8 10.7*  MG -- --  PHOS -- --   CBC:  Basename 02/14/12 0410 02/13/12 0925  WBC 5.1 3.4*  NEUTROABS -- 2.1  HGB 11.1* 12.6  HCT 32.5* 38.0  MCV 86.4 87.0  PLT 191 197   Cardiac Enzymes: No results found for this basename: CKTOTAL:3,CKMB:3,CKMBINDEX:3,TROPONINI:3 in the last 72 hours Fasting Lipid Panel: No results found for this basename: CHOL,HDL,LDLCALC,TRIG,CHOLHDL,LDLDIRECT in the last 72 hours  Current Meds:    . acetaminophen      . acetaminophen  325 mg Oral Once  . amLODipine  5 mg Oral q morning - 10a  . aspirin EC  81 mg Oral Daily  . aspirin EC  81 mg Oral Daily  . atorvastatin  20 mg Oral QHS  . atropine  1 mg Intravenous Once  . calcium-vitamin D  1 tablet Oral Daily  . clonazePAM  0.25 mg Oral QHS  . clopidogrel  75 mg Oral Q breakfast  . docusate sodium  100 mg Oral BID  . fentaNYL      . heparin      . heparin      . labetalol  200 mg Oral BID  . lidocaine      . lisinopril  20 mg Oral QPM  . midazolam      .  multivitamin-lutein  1 capsule Oral BID  . pantoprazole  40 mg Oral Q1200  . polyethylene glycol  17 g Oral QODAY  . sodium chloride  3 mL Intravenous Q12H  . DISCONTD: PreserVision/Lutein  1 capsule Oral BID     ASSESSMENT AND PLAN:  1. Renal artery stenosis: s/p stenting of the right renal artery yesterday per Dr. Kirke Corin. Doing well.   2. HTN: Well controlled this am. Will d/c Lisinopril for now. Will continue Norvasc at current dose and Lebatalol at current dose. She will follow her BP at home. If it is significantly elevated, will call for instructions on restarting Lisinopril   3. Hypokalemia: replace potassium this am.   4. Dispo: D/C home today. F/U Dr. Kirke Corin in [facility] clinic in [address] office 2-3 weeks.    Houston Surges  4/4/20137:11 AM

## 2012-02-14 NOTE — Discharge Summary (Signed)
Full note written this am. cdm 

## 2012-02-14 NOTE — Discharge Summary (Signed)
Discharge Summary   Patient ID: [name],  MRN: [id number], DOB/AGE: Jul 15, 1936 76 y.o.  Admit date: 02/13/2012 Discharge date: 02/14/2012  Discharge Diagnoses Principal Problem:  *ATHEROSLERO NATV ART EXTREM W/INTERMIT CLAUDICAT Active Problems:  DYSLIPIDEMIA  Hypertension   Allergies Allergies  Allergen Reactions  . Cefixime Shortness Of Breath  . Conjugated Estrogens Other (See Comments)    REACTION: flushing  . Morphine And Related Other (See Comments)    Morphine injection Neck pain  . Codeine Other (See Comments)    REACTION: dizziness  . Iohexol      Desc: pt has anxiety and an irregular heartbeat. contrast exacerbates this. pt was very uncomfortable after a 20cc bolus for an miroi. we did w/o iv 11/09 per dr. Eppie Gibson. pt will talk w/ md about this and will probably not get iv contrast in the future at her re, Onset Date: [id number]   . Adenosine Other (See Comments)    Elevated heart rate when doing stress test    Diagnostic Studies/Procedures  ABDOMINAL ANGIOGRAPHY WITH RIGHT RENAL ARTERY STENTING- 02/13/12  Procedure: Nonselective abdominal aortogram, selective right renal angiography, right renal artery stenting.  Indication: Refractory hypertension.  Procedural details: The left groin was prepped, draped, and anesthetized with 1% lidocaine. Using modified Seldinger technique, a 6 French sheath was introduced into the left femoral artery. Abdominal aortogram was done in the AP position with a pigtail catheter. This showed significant ostial right artery stenosis. It was decided to proceed with renal artery stenting.  Interventional procedure details: The patient was given 4000 units of unfractionated heparin ACT was 210. An additional 2000 units of heparin was given. Initially I used a JR 4 renal guide but did not fit well. This was exchanged to a renal LIMA guide. Selective renal angiography was performed. The lesion was passed with a [place] core wire easily. I  then placed a 5 x 15 mm Genesis stent and deployed it to 10 atmosphere. The balloon was then withdrawn and inflated again flared the ostium to 12 atmospheres. Angiography showed good results with only minimal ostial stenosis. The patient tolerated the procedure well with no immediate complications. The sheath would be more removed manually once ACT is down.  Procedural Findings:  Abdominal aortogram: This showed single left renal artery which is patent without significant disease or stenosis. On the right side, there are 2 renal arteries. Superior one is relatively small and free of significant disease. The lower right renal artery supplies most of the right kidney and has a 90% ulcerated plaque at the ostium. The rest of the vessel is free of significant disease.  A stent graft is noted in the distal aorta with 2 limbs going to the common iliac arteries bilaterally. The left side is patent. However, the stent is occluded at the proximal right common iliac artery which is a chronic finding.  Final Conclusions:  1. Significant ostial right renal artery stenosis.  2. Known occluded right common iliac artery at the previously placed stent the graft.  3. Successful stenting of ostial right renal artery stenosis.  Recommendations:  I recommend Plavix for 30 days. Continue aggressive treatment of hypertension.  History of Present Illness  [name] is a 76yo Caucasian female with PMHx significant for type 2 DM,  PVD, carotid artery disease, AAA (s/p aortic, stent grafting in 2009), hyperlipidemia who follows Dr. Earnestine Leys for poorly controlled hypertension (on multiple antihypertensives) who presented to [facility] hospital on 02/13/12 for elective abdominal angiography.   She  has had marked elevations in the BP (SBP to 240 in the past) with prior ED presentations for these. She has had to take an extra Rx for her BP occasionally. She endorsed medication compliance. Home SBP readings range from 110-240 but most of  the time 140-150s. She endorsed headache when BP high. She endorses R leg discomfort upon ambulating. She denied chest pain. Given this history, Dr. Earnestine Leys scheduled her for an abdominal angiography to assess for renal artery stenosis as the etiology to her poorly controlled hypertension.   Hospital Course   She was informed, consented and underwent the procedure outlined above. Notably, significant ostial right renal artery stenosis was visualized. There was stent grafting noted in the distal aorta with 2 limbs going to the common iliac arteries bilaterally. The left side was found to be patient, however the right was occluding. This was successfully stented. The patient tolerated the procedure well without complications. The recommendation was made to continue Plavix x 30 days, and to continue aggressive treatment of hypertension. The patient was transferred to [facility] in stable condition. There were no acute events overnight. The patient's groin access site was without evidence of infection, hematoma or pseudoaneurysm on exam. She ambulated well this AM without incident. She was found to be mildly hypokalemic. This was repleted.   Today, she is stable, at baseline, and will be discharged home. She will continue all outpatient medications except for Lisinopril. This was held. The patient was advised to call the office should her BP increase off of this med. Should that occur, she may resume it. She will follow-up with Dr. Kirke Corin in 2-3 weeks in the [facility] at the [facility] office.   Discharge Vitals:  Blood pressure 132/60, pulse 70, temperature 98 F (36.7 C), temperature source Oral, resp. rate 18, height 5' 1.5" (1.562 m), weight 53.8 kg (118 lb 9.7 oz), SpO2 100.00%.   Labs: Recent Labs  Basename 02/14/12 0410 02/13/12 0925   WBC 5.1 3.4*   HGB 11.1* 12.6   HCT 32.5* 38.0   MCV 86.4 87.0   PLT 191 197    Lab 02/14/12 0410 02/13/12 0925  NA 138 139  K 3.6 4.0  CL 103 103  CO2 26 28  BUN  17 17  CREATININE 0.85 0.73  CALCIUM 9.8 10.7*  PROT -- --  BILITOT -- --  ALKPHOS -- --  ALT -- --  AST -- --  AMYLASE -- --  LIPASE -- --  GLUCOSE 98 115*    Disposition:  Discharge Orders    Future Appointments: Provider: Department: Dept Phone: Center:   02/20/2012 2:30 PM Iran Ouch, MD [facility] [phone] [id number]     Follow-up Information    Follow up with Peyton Bottoms, MD in 2 weeks.   Contact information:   [address] [place] [address] [phone]       Follow up with Lorine Bears, MD on 02/20/2012. (At 2:30 PM for follow-up after this hospitalization. )    Contact information:   [address] [place] [place] [place] [address] [phone]          Discharge Medications:  Medication List  As of 02/14/2012  9:08 AM   START taking these medications         clopidogrel 75 MG tablet   Commonly known as: PLAVIX   Take 1 tablet (75 mg total) by mouth daily.  CONTINUE taking these medications         acetaminophen 325 MG tablet   Commonly known as: TYLENOL      amLODipine 5 MG tablet   Commonly known as: NORVASC      aspirin EC 81 MG tablet      atorvastatin 40 MG tablet   Commonly known as: LIPITOR      calcium-vitamin D 500-200 MG-UNIT per tablet   Commonly known as: OSCAL WITH D      clonazePAM 0.5 MG tablet   Commonly known as: KLONOPIN      cloNIDine 0.1 MG tablet   Commonly known as: CATAPRES      docusate sodium 100 MG capsule   Commonly known as: COLACE      fish oil-omega-3 fatty acids 1000 MG capsule      labetalol 200 MG tablet   Commonly known as: NORMODYNE      omeprazole 20 MG capsule   Commonly known as: PRILOSEC      polyethylene glycol packet   Commonly known as: MIRALAX / GLYCOLAX      PreserVision/Lutein Caps         STOP taking these medications         lisinopril 20 MG tablet          Where to get your medications       Information on where to  get these meds is not yet available. Ask your nurse or doctor.         clopidogrel 75 MG tablet           Outstanding Labs/Studies: None  Duration of Discharge Encounter: Greater than 30 minutes including physician time.  Signed, R. Hurman Horn, PA-C 02/14/2012, 9:08 AM

## 2012-02-14 NOTE — Progress Notes (Signed)
Received order "post PCI" however not a cardiac pt. Had renal stent.  N/A Ethelda Chick CES, ACSM

## 2012-02-20 ENCOUNTER — Ambulatory Visit (INDEPENDENT_AMBULATORY_CARE_PROVIDER_SITE_OTHER): Payer: Medicare Other | Admitting: Cardiovascular Disease

## 2012-02-20 ENCOUNTER — Encounter: Payer: Self-pay | Admitting: Cardiovascular Disease

## 2012-02-20 VITALS — BP 130/78 | HR 80 | Ht 61.0 in | Wt 120.0 lb

## 2012-02-20 DIAGNOSIS — I701 Atherosclerosis of renal artery: Secondary | ICD-10-CM

## 2012-02-20 DIAGNOSIS — I70219 Atherosclerosis of native arteries of extremities with intermittent claudication, unspecified extremity: Secondary | ICD-10-CM

## 2012-02-20 NOTE — Patient Instructions (Addendum)
Your physician has requested that you have a renal artery duplex in 6 months. During this test, an ultrasound is used to evaluate blood flow to the kidneys. Allow one hour for this exam. Do not eat after midnight the day before and avoid carbonated beverages. Take your medications as you usually do.  Your physician recommends that you schedule a follow-up appointment in: after renal artery duplex is done in 6 months.

## 2012-02-22 ENCOUNTER — Encounter: Payer: Self-pay | Admitting: Cardiovascular Disease

## 2012-02-22 DIAGNOSIS — I701 Atherosclerosis of renal artery: Secondary | ICD-10-CM | POA: Insufficient documentation

## 2012-02-22 NOTE — Progress Notes (Signed)
HPI  This is a 76 year old female who is here today for a followup visit. I recently performed an abdominal angiogram which showed a 90% stenosis in the ostial right renal artery. This was performed due to refractory hypertension. She underwent bare-metal stent placement. She had a small hematoma in the left groin after sheath pull which improved with manual pressure. She was observed overnight. Her blood pressure was running somewhat low side and thus she was discharged off lisinopril. Her blood pressure readings at home have asked he improved. She is still off lisinopril and had to use clonidine on a 2 times. She was placed on Plavix after stent placement. She feels that it is making her feel sick with dizziness. She has multiple intolerances to previous medications.  Allergies  Allergen Reactions  . Cefixime Shortness Of Breath  . Conjugated Estrogens Other (See Comments)    REACTION: flushing  . Morphine And Related Other (See Comments)    Morphine injection Neck pain  . Codeine Other (See Comments)    REACTION: dizziness  . Iohexol      Desc: pt has anxiety and an irregular heartbeat. contrast exacerbates this. pt was very uncomfortable after a 20cc bolus for an miroi. we did w/o iv 11/09 per dr. Eppie Gibson. pt will talk w/ md about this and will probably not get iv contrast in the future at her re, Onset Date: [id number]   . Adenosine Other (See Comments)    Elevated heart rate when doing stress test     Current Outpatient Prescriptions on File Prior to Visit  Medication Sig Dispense Refill  . acetaminophen (TYLENOL) 325 MG tablet Take 650 mg by mouth every 6 (six) hours as needed. For pain.      Marland Kitchen amLODipine (NORVASC) 5 MG tablet Take 5 mg by mouth every morning. As needed      . aspirin EC 81 MG tablet Take 81 mg by mouth daily.      Marland Kitchen atorvastatin (LIPITOR) 40 MG tablet Take 20 mg by mouth at bedtime.       . calcium-vitamin D (OSCAL WITH D) 500-200 MG-UNIT per tablet Take 1 tablet  by mouth daily.       . clonazePAM (KLONOPIN) 0.5 MG tablet Take 0.25 mg by mouth at bedtime.       . cloNIDine (CATAPRES) 0.1 MG tablet Take 0.1 mg by mouth daily as needed. For blood pressure over 170      . clopidogrel (PLAVIX) 75 MG tablet Take 1 tablet (75 mg total) by mouth daily.  30 tablet  0  . docusate sodium (COLACE) 100 MG capsule Take 100 mg by mouth 2 (two) times daily.        . fish oil-omega-3 fatty acids 1000 MG capsule Take 1 g by mouth 2 (two) times daily.      Marland Kitchen labetalol (NORMODYNE) 200 MG tablet Take 200 mg by mouth 2 (two) times daily.       . Multiple Vitamins-Minerals (PRESERVISION/LUTEIN) CAPS Take 1 capsule by mouth 2 (two) times daily.       Marland Kitchen omeprazole (PRILOSEC) 20 MG capsule Take 20 mg by mouth daily.       . polyethylene glycol (MIRALAX / GLYCOLAX) packet Take 17 g by mouth every other day.          Past Medical History  Diagnosis Date  . Hypertension     pdifficult to control on multiple medications  . Arrhythmia     tachypalpitations cardiac  moniter time 2 evaluated by Dr.Kline in june 2008 atrial tachycardia ruled out,adrenaline or excess adrenergic sensitivity ruled out  . Normal nuclear stress test     10/2007staus post cardiac cath in the mid 90s nonobstructive coronary artery disease  . Macular degeneration   . Left lower quadrant pain     not resolved post stenting aortic penetrating ulcer distal aortic pentrating ulcer treated with endograft 07/2008  . Anxiety disorder     probable somatic anxiety disorder  . Diverticulosis     left sided noted on colonoscopy 02/2006  . Chronic constipation     secondary to verapamil but present priot to use  . Esophageal reflux disease   . Allergy to ACE inhibitors     questionable cough on ace inhibitors currently tolerating  . Chronic low back pain     lumbosacral spine changes were noted in MRI scan with left radiculopathy 12/2004  . Hyperlipidemia   . Hiatal hernia   . Arthritis   . Dizziness   .  Anxiety   . Abdominal aortic aneurysm     Complex ulcerated plaque of the infrarenal abdominal aorta mild renal artery stenosis on the right side, normal left renal artery catheterization 2009, status post aortic stent graft  . Claudication     right leg secondary to chronic occlusion of the right limb of the aortic stent graft.  . Trigeminal neuralgia     right  . Complication of anesthesia 1981    "heart was racing after waking up from back OR"  . Peripheral vascular disease   . Shortness of breath     "sometimes; at rest"  . Diabetes mellitus     diet control  . Anemia   . Migraines     "chronic"  . Renal artery stenosis     S/p R renal artery stenting 02/13/12  . Hypertension     Difficult to control on multiple antihypertensives     Past Surgical History  Procedure Date  . Carotid endarterectomy ~ 1993    left  . Atherosclerotic ulcer     penetrating atherosclerotic ulcer infrarenal aorta  . Endologix powerlink bifurcated      covered stent graft  . Tonsillectomy and adenoidectomy 1947  . Abdominal hysterectomy 1982  . Lumbar disc surgery 1981  . Breast biopsy 1990's    left  . Renal artery stent 02/13/12    right; abdominal angiography     Family History  Problem Relation Age of Onset  . Heart failure Mother   . Coronary artery disease Mother   . Heart disease Mother   . Heart attack Father   . Heart disease Father   . Heart disease Brother      History   Social History  . Marital Status: Married    Spouse Name: N/A    Number of Children: N/A  . Years of Education: N/A   Occupational History  . 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 Smoking status: Former Smoker -- 0.2 packs/day for 1 years    Types: Cigarettes    Quit date: 11/13/1979  . Smokeless tobacco: Never Used  . Alcohol Use: No  . Drug Use: No  . Sexually Active: No   Other Topics Concern  . Not on file   Social History Narrative  . No narrative on  file     PHYSICAL EXAM   BP 130/78  Pulse 80  Ht 5'  1" (1.549 m)  Wt 120 lb (54.432 kg)  BMI 22.67 kg/m2 Constitutional: She is oriented to person, place, and time. She appears well-developed and well-nourished. No distress.  HENT: No nasal discharge.  Head: Normocephalic and atraumatic.  Eyes: Pupils are equal and round. Right eye exhibits no discharge. Left eye exhibits no discharge.  Neck: Normal range of motion. Neck supple. No JVD present. No thyromegaly present.  Cardiovascular: Normal rate, regular rhythm, normal heart sounds. Exam reveals no gallop and no friction rub. No murmur heard.  Pulmonary/Chest: Effort normal and breath sounds normal. No stridor. No respiratory distress. She has no wheezes. She has no rales. She exhibits no tenderness.  Abdominal: Soft. Bowel sounds are normal. She exhibits no distension. There is no tenderness. There is no rebound and no guarding.  Musculoskeletal: Normal range of motion. She exhibits no edema and no tenderness.  Neurological: She is alert and oriented to person, place, and time. Coordination normal.  Skin: Skin is warm and dry. No rash noted. She is not diaphoretic. No erythema. No pallor.  Psychiatric: She has a normal mood and affect. Her behavior is normal. Judgment and thought content normal.  Left groin: There is a superficial bruise all the way to the mid thigh but there is no evidence of hematoma or bruit.    ASSESSMENT AND PLAN

## 2012-02-22 NOTE — Assessment & Plan Note (Signed)
The patient was found to have severe ostial right renal artery stenosis. She underwent successful stent placement. It appears that her blood pressure has actually improved since then. She is still off lisinopril. I asked her to continue to monitor her blood pressure at home. Lisinopril can be resumed at some point if she starts having consistent blood pressure readings above 140/90. Currently most of the readings are less than that. She is only on labetalol and clonidine as needed. She is complaining of side effects to Plavix. I assured her that she would have to be on this only for a total of one month. She also wants to undergo colonoscopy. There is no contraindication from a cardiovascular standpoint. However, it's probably preferable to wait until she is off Plavix. The patient will be scheduled for renal artery duplex ultrasound with followup in 6 months.

## 2012-02-25 ENCOUNTER — Encounter (INDEPENDENT_AMBULATORY_CARE_PROVIDER_SITE_OTHER): Payer: Self-pay | Admitting: *Deleted

## 2012-03-18 ENCOUNTER — Telehealth: Payer: Self-pay | Admitting: Cardiology

## 2012-03-18 NOTE — Telephone Encounter (Signed)
See below.  Please advise when you need to see her in office again.

## 2012-03-18 NOTE — Telephone Encounter (Signed)
Patient had stint placed saw Dr Kirke Corin 4/10( he wants her back in 6 months) . She is questioning if she needs to see Dr Andee Lineman since her discharge papers from [place] state to see [facility] in 2 weeks

## 2012-03-23 NOTE — Telephone Encounter (Signed)
Yes she should see me in 4-6 weeks.

## 2012-03-26 NOTE — Telephone Encounter (Signed)
See below - please schedule appointment.

## 2012-04-16 ENCOUNTER — Ambulatory Visit (HOSPITAL_COMMUNITY): Admission: RE | Admit: 2012-04-16 | Payer: Medicare Other | Source: Ambulatory Visit | Admitting: Internal Medicine

## 2012-04-16 ENCOUNTER — Encounter (HOSPITAL_COMMUNITY): Admission: RE | Payer: Self-pay | Source: Ambulatory Visit

## 2012-04-16 SURGERY — COLONOSCOPY
Anesthesia: Moderate Sedation

## 2012-05-06 ENCOUNTER — Encounter: Payer: Self-pay | Admitting: Cardiology

## 2012-05-06 ENCOUNTER — Ambulatory Visit (INDEPENDENT_AMBULATORY_CARE_PROVIDER_SITE_OTHER): Payer: Medicare Other | Admitting: Cardiology

## 2012-05-06 VITALS — BP 195/96 | HR 83 | Ht 61.5 in | Wt 121.1 lb

## 2012-05-06 DIAGNOSIS — I10 Essential (primary) hypertension: Secondary | ICD-10-CM

## 2012-05-06 DIAGNOSIS — I714 Abdominal aortic aneurysm, without rupture, unspecified: Secondary | ICD-10-CM

## 2012-05-06 DIAGNOSIS — I70219 Atherosclerosis of native arteries of extremities with intermittent claudication, unspecified extremity: Secondary | ICD-10-CM

## 2012-05-06 DIAGNOSIS — I779 Disorder of arteries and arterioles, unspecified: Secondary | ICD-10-CM

## 2012-05-06 DIAGNOSIS — I701 Atherosclerosis of renal artery: Secondary | ICD-10-CM

## 2012-05-06 NOTE — Assessment & Plan Note (Signed)
Status post renal artery stenting but with much improvement in blood pressure.

## 2012-05-06 NOTE — Patient Instructions (Signed)
Continue all current medications. Your physician wants you to follow up in: 6 months.  You will receive a reminder letter in the mail one-two months in advance.  If you don't receive a letter, please call our office to schedule the follow up appointment   

## 2012-05-06 NOTE — Assessment & Plan Note (Signed)
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 Currently asymptomatic.

## 2012-05-06 NOTE — Assessment & Plan Note (Signed)
Followed by carotid Dopplers by Dr. Sherril Croon

## 2012-05-06 NOTE — Progress Notes (Signed)
[name] Bottoms, MD, [facility] ABIM Board Certified in Adult Cardiovascular Medicine,Internal Medicine and Critical Care Medicine    CC: Followup patient with significant peripheral vascular disease and hypertension  HPI: The patient underwent stenting of the right renal artery and has been able to wean off several blood pressure medications. The patient brought her blood pressure  home records. The patient states that her stamina has much improved. She has more energy. Also her headaches have dramatically improved. She does feel tired that time but when she stops her activity and rest she feels better. She does have a few palpitations which occur infrequently and the last a few seconds. There is no prior history of documented atrial fibrillation. However I told the patient that if this becomes more prolonged we will consider CardioNet monitor. Currently she denies any orthopnea PND. No presyncope or syncope  PMH: reviewed and listed in Problem List in Electronic Records (and see below) Past Medical History  Diagnosis Date  . Hypertension     pdifficult to control on multiple medications  . Arrhythmia     tachypalpitations cardiac moniter time 2 evaluated by Dr.Kline in june 2008 atrial tachycardia ruled out,adrenaline or excess adrenergic sensitivity ruled out  . Normal nuclear stress test     10/2007staus post cardiac cath in the mid 90s nonobstructive coronary artery disease  . Macular degeneration   . Left lower quadrant pain     not resolved post stenting aortic penetrating ulcer distal aortic pentrating ulcer treated with endograft 07/2008  . Anxiety disorder     probable somatic anxiety disorder  . Diverticulosis     left sided noted on colonoscopy 02/2006  . Chronic constipation     secondary to verapamil but present priot to use  . Esophageal reflux disease   . Allergy to ACE inhibitors     questionable cough on ace inhibitors currently tolerating  . Chronic low back pain    lumbosacral spine changes were noted in MRI scan with left radiculopathy 12/2004  . Hyperlipidemia   . Hiatal hernia   . Arthritis   . Dizziness   . Anxiety   . Abdominal aortic aneurysm     Complex ulcerated plaque of the infrarenal abdominal aorta mild renal artery stenosis on the right side, normal left renal artery catheterization 2009, status post aortic stent graft  . Claudication     right leg secondary to chronic occlusion of the right limb of the aortic stent graft.  . Trigeminal neuralgia     right  . Complication of anesthesia 1981    "heart was racing after waking up from back OR"  . Peripheral vascular disease   . Shortness of breath     "sometimes; at rest"  . Diabetes mellitus     diet control  . Anemia   . Migraines     "chronic"  . Hypertension     Difficult to control on multiple antihypertensives  . Renal artery stenosis     S/p R renal artery stenting 02/13/12   Past Surgical History  Procedure Date  . Carotid endarterectomy ~ 1993    left  . Atherosclerotic ulcer     penetrating atherosclerotic ulcer infrarenal aorta  . Endologix powerlink bifurcated      covered stent graft  . Tonsillectomy and adenoidectomy 1947  . Abdominal hysterectomy 1982  . Lumbar disc surgery 1981  . Breast biopsy 1990's    left  . Renal artery stent 02/13/12    right;  abdominal angiography    Allergies/SH/FHX : available in Electronic Records for review  Allergies  Allergen Reactions  . Cefixime Shortness Of Breath  . Conjugated Estrogens Other (See Comments)    REACTION: flushing  . Morphine And Related Other (See Comments)    Morphine injection Neck pain  . Codeine Other (See Comments)    REACTION: dizziness  . Iohexol      Desc: pt has anxiety and an irregular heartbeat. contrast exacerbates this. pt was very uncomfortable after a 20cc bolus for an miroi. we did w/o iv 11/09 per dr. Eppie Gibson. pt will talk w/ md about this and will probably not get iv contrast in the  future at her re, Onset Date: [id number]   . Adenosine Other (See Comments)    Elevated heart rate when doing stress test   History   Social History  . Marital Status: Married    Spouse Name: N/A    Number of Children: N/A  . Years of Education: N/A   Occupational History  . 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 Smoking status: Former Smoker -- 0.2 packs/day for 1 years    Types: Cigarettes    Quit date: 11/13/1979  . Smokeless tobacco: Never Used  . Alcohol Use: No  . Drug Use: No  . Sexually Active: No   Other Topics Concern  . Not on file   Social History Narrative  . No narrative on file   Family History  Problem Relation Age of Onset  . Heart failure Mother   . Coronary artery disease Mother   . Heart disease Mother   . Heart attack Father   . Heart disease Father   . Heart disease Brother     Medications: Current Outpatient Prescriptions  Medication Sig Dispense Refill  . acetaminophen (TYLENOL) 325 MG tablet Take 325 mg by mouth every 6 (six) hours as needed. For pain.      Marland Kitchen aspirin EC 81 MG tablet Take 81 mg by mouth daily.      Marland Kitchen atorvastatin (LIPITOR) 20 MG tablet Take 20 mg by mouth daily.      . calcium-vitamin D (OSCAL WITH D) 500-200 MG-UNIT per tablet Take 1 tablet by mouth daily.       . clonazePAM (KLONOPIN) 0.5 MG tablet Take 0.25 mg by mouth as needed.       . cloNIDine (CATAPRES) 0.1 MG tablet Take 0.1 mg by mouth daily as needed. For blood pressure over 170      . docusate sodium (COLACE) 100 MG capsule Take 100 mg by mouth 2 (two) times daily.        Marland Kitchen labetalol (NORMODYNE) 200 MG tablet Take 200 mg by mouth 2 (two) times daily.       . Multiple Vitamins-Minerals (PRESERVISION/LUTEIN) CAPS Take 1 capsule by mouth 2 (two) times daily.       Marland Kitchen omeprazole (PRILOSEC) 20 MG capsule Take 20 mg by mouth daily.       . polyethylene glycol (MIRALAX / GLYCOLAX) packet Take 17 g by mouth daily as needed. Constipation         ROS: No nausea or vomiting. No fever or chills.No melena or hematochezia.No bleeding.No claudication  Physical Exam: BP 195/96  Pulse 83  Ht 5' 1.5" (1.562 m)  Wt 121 lb 1.9 oz (54.94 kg)  BMI 22.51 kg/m2 General: Well-nourished white female in no distress. Neck: Left carotid bruit status post carotid  endarterectomy. No thyromegaly nonnodular thyroid Lungs: Clear breath sounds bilaterally no wheezing Cardiac: Regular rate and rhythm with normal S1-S2 no murmur rubs or gallops Vascular: No edema. Decreased dorsalis pedal and posterior tibial pulse in the right leg. Skin: Warm and dry Physcologic: Normal affect  12lead ECG: Not obtained Limited bedside ECHO:N/A No images are attached to the encounter.   Assessment and Plan  Abdominal aortic aneurysm Followed by vascular surgery. Status post endograft  ATHEROSLERO NATV ART EXTREM W/INTERMIT CLAUDICAT Stenosis of the right iliofemoral limb of the endograft. Currently asymptomatic.  Carotid artery disease Followed by carotid Dopplers by Dr. Sherril Croon  Hypertension Blood pressure under much better control after renal artery stenting. Patient is only on one blood pressure medication and use clonidine when necessary for elevated blood pressures. She used to be on 4 drugs. She feels much improved. She is very happy that she only has to take one antihypertensive medication and is experiencing less side effects  Renal artery stenosis Status post renal artery stenting but with much improvement in blood pressure.   Patient Active Problem List  Diagnosis  . DYSLIPIDEMIA  . GENERALIZED ANXIETY DISORDER  . CHRONIC TENSION TYPE HEADACHE  . BEN HTN HEART DISEASE WITHOUT HEART FAIL  . ATHEROSLERO NATV ART EXTREM W/INTERMIT CLAUDICAT  . UNSPECIFIED TACHYCARDIA  . Carotid artery disease  . Hypertension  . Claudication  . Abdominal aortic aneurysm  . Normal nuclear stress test  . Renal artery stenosis

## 2012-05-06 NOTE — Assessment & Plan Note (Signed)
Followed by vascular surgery. 

## 2012-05-06 NOTE — Assessment & Plan Note (Signed)
Blood pressure under much better control after renal artery stenting. Patient is only on one blood pressure medication and use clonidine when necessary for elevated blood pressures. She used to be on 4 drugs. She feels much improved. She is very happy that she only has to take one antihypertensive medication and is experiencing less side effects

## 2012-08-27 ENCOUNTER — Ambulatory Visit: Payer: Medicare Other | Admitting: Cardiovascular Disease

## 2012-09-03 ENCOUNTER — Ambulatory Visit: Payer: Medicare Other | Admitting: Cardiovascular Disease

## 2012-09-10 ENCOUNTER — Ambulatory Visit (INDEPENDENT_AMBULATORY_CARE_PROVIDER_SITE_OTHER): Payer: Medicare Other | Admitting: Cardiovascular Disease

## 2012-09-10 ENCOUNTER — Encounter: Payer: Self-pay | Admitting: Cardiovascular Disease

## 2012-09-10 VITALS — BP 188/94 | HR 86 | Ht 61.0 in | Wt 117.8 lb

## 2012-09-10 DIAGNOSIS — I739 Peripheral vascular disease, unspecified: Secondary | ICD-10-CM

## 2012-09-10 DIAGNOSIS — I701 Atherosclerosis of renal artery: Secondary | ICD-10-CM

## 2012-09-10 NOTE — Assessment & Plan Note (Signed)
The patient is overall doing reasonably well. Her home blood pressure readings are optimal on most readings. I recommend a followup renal artery duplex ultrasound to evaluate patency of the stent. She is to continue current medications and followup with me in 6 months.

## 2012-09-10 NOTE — Progress Notes (Signed)
HPI  This is a 76 year old female who is here today for a followup visit. She has known history of severe right renal artery stenosis status post stent placement in April of this year. Her blood pressure improved significantly after that. Her blood pressure is elevated today however, her home blood pressure readings are mostly under 130 systolic. She is currently on labetalol only. She uses clonidine as needed for systolic blood pressure above 161. She does not have to do that very frequently. She has been doing well overall he denies any chest pain or dyspnea. Unfortunately, her husband died early this month due to complications of terminal cancer. The patient has known occluded right iliac graft with no significant worsening of claudication.  Allergies  Allergen Reactions  . Cefixime Shortness Of Breath  . Conjugated Estrogens Other (See Comments)    REACTION: flushing  . Morphine And Related Other (See Comments)    Morphine injection Neck pain  . Codeine Other (See Comments)    REACTION: dizziness  . Iohexol      Desc: pt has anxiety and an irregular heartbeat. contrast exacerbates this. pt was very uncomfortable after a 20cc bolus for an miroi. we did w/o iv 11/09 per dr. Eppie Gibson. pt will talk w/ md about this and will probably not get iv contrast in the future at her re, Onset Date: [id number]   . Adenosine Other (See Comments)    Elevated heart rate when doing stress test     Current Outpatient Prescriptions on File Prior to Visit  Medication Sig Dispense Refill  . acetaminophen (TYLENOL) 325 MG tablet Take 325 mg by mouth every 6 (six) hours as needed. For pain.      Marland Kitchen aspirin EC 81 MG tablet Take 81 mg by mouth daily.      Marland Kitchen atorvastatin (LIPITOR) 20 MG tablet Take 20 mg by mouth daily.      . calcium-vitamin D (OSCAL WITH D) 500-200 MG-UNIT per tablet Take 1 tablet by mouth daily.       . clonazePAM (KLONOPIN) 0.5 MG tablet Take 0.25 mg by mouth as needed.       . cloNIDine  (CATAPRES) 0.1 MG tablet Take 0.1 mg by mouth daily as needed. For blood pressure over 170      . docusate sodium (COLACE) 100 MG capsule Take 100 mg by mouth 2 (two) times daily.        Marland Kitchen labetalol (NORMODYNE) 200 MG tablet Take 200 mg by mouth 2 (two) times daily.       . Multiple Vitamins-Minerals (PRESERVISION/LUTEIN) CAPS Take 1 capsule by mouth 2 (two) times daily.       Marland Kitchen omeprazole (PRILOSEC) 20 MG capsule Take 20 mg by mouth daily.       . polyethylene glycol (MIRALAX / GLYCOLAX) packet Take 17 g by mouth daily as needed. Constipation         Past Medical History  Diagnosis Date  . Hypertension     pdifficult to control on multiple medications  . Arrhythmia     tachypalpitations cardiac moniter time 2 evaluated by Dr.Kline in june 2008 atrial tachycardia ruled out,adrenaline or excess adrenergic sensitivity ruled out  . Normal nuclear stress test     10/2007staus post cardiac cath in the mid 90s nonobstructive coronary artery disease  . Macular degeneration   . Left lower quadrant pain     not resolved post stenting aortic penetrating ulcer distal aortic pentrating ulcer treated with endograft 07/2008  .  Anxiety disorder     probable somatic anxiety disorder  . Diverticulosis     left sided noted on colonoscopy 02/2006  . Chronic constipation     secondary to verapamil but present priot to use  . Esophageal reflux disease   . Allergy to ACE inhibitors     questionable cough on ace inhibitors currently tolerating  . Chronic low back pain     lumbosacral spine changes were noted in MRI scan with left radiculopathy 12/2004  . Hyperlipidemia   . Hiatal hernia   . Arthritis   . Dizziness   . Anxiety   . Abdominal aortic aneurysm     Complex ulcerated plaque of the infrarenal abdominal aorta mild renal artery stenosis on the right side, normal left renal artery catheterization 2009, status post aortic stent graft  . Claudication     right leg secondary to chronic occlusion of  the right limb of the aortic stent graft.  . Trigeminal neuralgia     right  . Complication of anesthesia 1981    "heart was racing after waking up from back OR"  . Peripheral vascular disease   . Shortness of breath     "sometimes; at rest"  . Diabetes mellitus     diet control  . Anemia   . Migraines     "chronic"  . Hypertension     Difficult to control on multiple antihypertensives  . Renal artery stenosis     S/p R renal artery stenting 02/13/12     Past Surgical History  Procedure Date  . Carotid endarterectomy ~ 1993    left  . Atherosclerotic ulcer     penetrating atherosclerotic ulcer infrarenal aorta  . Endologix powerlink bifurcated      covered stent graft  . Tonsillectomy and adenoidectomy 1947  . Abdominal hysterectomy 1982  . Lumbar disc surgery 1981  . Breast biopsy 1990's    left  . Renal artery stent 02/13/12    right; abdominal angiography     Family History  Problem Relation Age of Onset  . Heart failure Mother   . Coronary artery disease Mother   . Heart disease Mother   . Heart attack Father   . Heart disease Father   . Heart disease Brother      History   Social History  . Marital Status: Married    Spouse Name: N/A    Number of Children: N/A  . Years of Education: N/A   Occupational History  . 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 Smoking status: Former Smoker -- 0.2 packs/day for 1 years    Types: Cigarettes    Quit date: 11/13/1979  . Smokeless tobacco: Never Used  . Alcohol Use: No  . Drug Use: No  . Sexually Active: No   Other Topics Concern  . Not on file   Social History Narrative  . No narrative on file     PHYSICAL EXAM   BP 188/94  Pulse 86  Ht 5\' 1"  (1.549 m)  Wt 117 lb 12.8 oz (53.434 kg)  BMI 22.26 kg/m2 Constitutional: She is oriented to person, place, and time. She appears well-developed and well-nourished. No distress.  HENT: No nasal discharge.  Head: Normocephalic  and atraumatic.  Eyes: Pupils are equal and round. Right eye exhibits no discharge. Left eye exhibits no discharge.  Neck: Normal range of motion. Neck supple. No JVD present. No thyromegaly present.  Cardiovascular: Normal rate, regular rhythm, normal heart sounds. Exam reveals no gallop and no friction rub. No murmur heard.  Pulmonary/Chest: Effort normal and breath sounds normal. No stridor. No respiratory distress. She has no wheezes. She has no rales. She exhibits no tenderness.  Abdominal: Soft. Bowel sounds are normal. She exhibits no distension. There is no tenderness. There is no rebound and no guarding.  Musculoskeletal: Normal range of motion. She exhibits no edema and no tenderness.  Neurological: She is alert and oriented to person, place, and time. Coordination normal.  Skin: Skin is warm and dry. No rash noted. She is not diaphoretic. No erythema. No pallor.  Psychiatric: She has a normal mood and affect. Her behavior is normal. Judgment and thought content normal.      ASSESSMENT AND PLAN

## 2012-09-10 NOTE — Patient Instructions (Addendum)
Your physician has requested that you have a renal artery duplex at the [facility] office. During this test, an ultrasound is used to evaluate blood flow to the kidneys. Allow one hour for this exam. Do not eat after midnight the day before and avoid carbonated beverages. Take your medications as you usually do.  Your physician wants you to follow-up in: 6 months.   You will receive a reminder letter in the mail two months in advance. If you don't receive a letter, please call our office to schedule the follow-up appointment.

## 2012-09-10 NOTE — Assessment & Plan Note (Signed)
Right lower extremity claudication due to chronic occlusion of right limb of the aortic stent graft. Her symptoms have been stable.

## 2012-09-18 ENCOUNTER — Encounter (INDEPENDENT_AMBULATORY_CARE_PROVIDER_SITE_OTHER): Payer: Medicare Other

## 2012-09-18 DIAGNOSIS — I701 Atherosclerosis of renal artery: Secondary | ICD-10-CM

## 2012-09-29 ENCOUNTER — Telehealth: Payer: Self-pay | Admitting: Internal Medicine

## 2012-09-29 NOTE — Telephone Encounter (Signed)
Will forward to [facility] Not an [facility] patient.  Is followed by Kirke Corin

## 2012-09-29 NOTE — Telephone Encounter (Signed)
New problem:  Test results.  

## 2012-09-29 NOTE — Telephone Encounter (Signed)
NA

## 2012-09-30 ENCOUNTER — Telehealth: Payer: Self-pay | Admitting: Cardiovascular Disease

## 2012-09-30 NOTE — Telephone Encounter (Signed)
Pt rtn call to debby re results

## 2012-09-30 NOTE — Telephone Encounter (Signed)
Pt states she prefers an am call when we get the results.

## 2012-10-08 NOTE — Telephone Encounter (Signed)
N/A.  LMTC. 

## 2012-10-30 ENCOUNTER — Telehealth: Payer: Self-pay | Admitting: Cardiovascular Disease

## 2012-10-30 MED ORDER — LABETALOL HCL 200 MG PO TABS: 400.0000 mg | ORAL_TABLET | Freq: Two times a day (BID) | ORAL | Status: DC

## 2012-10-30 MED ORDER — AMLODIPINE BESYLATE 5 MG PO TABS: 5.0000 mg | ORAL_TABLET | Freq: Every day | ORAL | Status: DC

## 2012-10-30 NOTE — Telephone Encounter (Signed)
New problem:   C/o blood pressure today 227/110.  @ 12 noon today.  Weak Taken medication clonidine .

## 2012-10-30 NOTE — Telephone Encounter (Signed)
[name] states her bp has been elevated today.  It is running 227/110 and 230/105 today.  She took clonidine at noon but bp is still high.  She states the clonidine usually brings it down quickly after she takes it normally. She took her Labetolol around 8:30 am today.  She only takes the clonidine when her bp is elevated.  She is also complaining of palpitations and a shaky feeling.  She is also complaining of a "cold feeling in her left foot and left hand".

## 2012-10-30 NOTE — Telephone Encounter (Signed)
Increase Labetolol to 400 mg bid , Add Amlodipine 5 mg once daily #30 with 3 refills.  If her BP remains elevated after making these changes, we will have to do a renal angiogram to check the stent. She should call us after few days.  Continue to take Clonidine prn.

## 2012-10-30 NOTE — Telephone Encounter (Signed)
Pt was notified and amlodipine rx was sent to [facility] per pt request.  She agrees to call back in a few days to let me know how she is doing.

## 2012-11-20 ENCOUNTER — Telehealth: Payer: Self-pay | Admitting: Cardiology

## 2012-11-20 NOTE — Telephone Encounter (Signed)
FOLLOWING DR Andee Lineman 11/25/12-SRS

## 2012-11-28 DIAGNOSIS — I739 Peripheral vascular disease, unspecified: Secondary | ICD-10-CM | POA: Insufficient documentation

## 2012-11-28 DIAGNOSIS — Z299 Encounter for prophylactic measures, unspecified: Secondary | ICD-10-CM | POA: Insufficient documentation

## 2012-11-28 DIAGNOSIS — I714 Abdominal aortic aneurysm, without rupture, unspecified: Secondary | ICD-10-CM | POA: Insufficient documentation

## 2012-11-28 DIAGNOSIS — Z0389 Encounter for observation for other suspected diseases and conditions ruled out: Secondary | ICD-10-CM | POA: Insufficient documentation

## 2012-11-28 DIAGNOSIS — I701 Atherosclerosis of renal artery: Secondary | ICD-10-CM | POA: Insufficient documentation

## 2012-11-28 DIAGNOSIS — I10 Essential (primary) hypertension: Secondary | ICD-10-CM | POA: Insufficient documentation

## 2014-02-19 ENCOUNTER — Ambulatory Visit (INDEPENDENT_AMBULATORY_CARE_PROVIDER_SITE_OTHER): Payer: Medicare Other | Admitting: Cardiology

## 2014-02-19 ENCOUNTER — Encounter: Payer: Self-pay | Admitting: Cardiology

## 2014-02-19 VITALS — BP 211/107 | HR 85 | Ht 61.0 in | Wt 123.1 lb

## 2014-02-19 DIAGNOSIS — I498 Other specified cardiac arrhythmias: Secondary | ICD-10-CM

## 2014-02-19 DIAGNOSIS — I10 Essential (primary) hypertension: Secondary | ICD-10-CM

## 2014-02-19 DIAGNOSIS — I701 Atherosclerosis of renal artery: Secondary | ICD-10-CM

## 2014-02-19 DIAGNOSIS — I2510 Atherosclerotic heart disease of native coronary artery without angina pectoris: Secondary | ICD-10-CM

## 2014-02-19 DIAGNOSIS — I471 Supraventricular tachycardia: Secondary | ICD-10-CM

## 2014-02-19 MED ORDER — AMLODIPINE BESYLATE 2.5 MG PO TABS: 2.5000 mg | ORAL_TABLET | Freq: Every day | ORAL | Status: DC

## 2014-02-19 NOTE — Patient Instructions (Signed)
Your physician recommends that you schedule a follow-up appointment in: 3 months. You will receive a reminder letter in the mail in about 1-2 months reminding you to call and schedule your appointment. If you don't receive this letter, please contact our office. Your physician has recommended you make the following change in your medication: Start amlodipine 2.5 mg daily. Your new prescription has been sent to your pharmacy. Your physician has requested that you have a renal artery duplex. During this test, an ultrasound is used to evaluate blood flow to the kidneys. Allow one hour for this exam. Do not eat after midnight the day before and avoid carbonated beverages. Take your medications as you usually do. You have been referred to Dr. Fletcher Anon for a follow up appointment.

## 2014-02-19 NOTE — Assessment & Plan Note (Signed)
Reports intermittent palpitations. ECG is normal. No progression in symptoms.

## 2014-02-19 NOTE — Assessment & Plan Note (Addendum)
Status post placement of aortic stent graft. Known occlusion of the right limb of the graft.

## 2014-02-19 NOTE — Assessment & Plan Note (Signed)
Records reviewed, blood pressure difficult to manage over the years as detailed above. Plan at this time is to continue current dose of labetalol, she states that she was unable to tolerate higher doses. Norvasc 2.5 mg be added in the evening, can be uptitrated further as tolerated. She also has clonidine to use as noted needed. She needs followup abdominal ultrasound with duplex imaging to reassess renal arteries, also visit with Dr. Fletcher Anon which will be arranged.

## 2014-02-19 NOTE — Assessment & Plan Note (Signed)
Reportedly nonobstructive disease at cardiac catheterization in 1990s, ischemic workup in 2012 showed abnormal ST segment changes with Lexiscan, however overall low risk Cardiolite imaging with LVEF 64%.

## 2014-02-19 NOTE — Assessment & Plan Note (Signed)
Continues on Lipitor, followed by Dr. Vyas. 

## 2014-02-19 NOTE — Progress Notes (Signed)
Clinical Summary [name] is a medically complex 78 y.o.female presenting for an office visit. She is a former patient of Dr. Dannielle Burn and also Dr. Fletcher Anon.. This is our first meeting in the office. She was last seen in October 2013. Extensive records were reviewed.  She presents stating that blood pressure has been increased more so than normal. Her history includes difficult to control hypertension, sporadic blood pressure shifts, component of anxiety and also whitecoat hypertension. Complicating matters is renovascular hypertension, blood pressure was better controlled after she had right renal artery stenting.  Abdominal ultrasound in November 2013 indicated normal kidney size, ostial right renal artery stenosis greater than 60%, normal left renal artery, normal caliber aorta and proximal common iliac arteries. She has not had PV followup.  Lexiscan Cardiolite in April 2012 showed abnormal ST segment changes, overall equivocal perfusion with probable soft tissue attenuation in the apical septal segment, no large reversible diffusion defects, LVEF 64%.  ECG today shows normal sinus rhythm.  We reviewed her medications. She takes labetalol regularly, clonidine only as needed when her systolic pressure is over 170. She does not recall details of prior antihypertensives, but has been on many different ones over time.   Allergies  Allergen Reactions  . Cefixime Shortness Of Breath  . Conjugated Estrogens Other (See Comments)    REACTION: flushing  . Morphine And Related Other (See Comments)    Morphine injection Neck pain  . Codeine Other (See Comments)    REACTION: dizziness  . Iohexol      Desc: pt has anxiety and an irregular heartbeat. contrast exacerbates this. pt was very uncomfortable after a 20cc bolus for an miroi. we did w/o iv 11/09 per dr. Kris Hartmann. pt will talk w/ md about this and will probably not get iv contrast in the future at her re, Onset Date: [id number]   . Adenosine  Other (See Comments)    Elevated heart rate when doing stress test    Current Outpatient Prescriptions  Medication Sig Dispense Refill  . acetaminophen (TYLENOL) 325 MG tablet Take 325 mg by mouth every 6 (six) hours as needed. For pain.      Marland Kitchen aspirin EC 81 MG tablet Take 81 mg by mouth daily.      Marland Kitchen atorvastatin (LIPITOR) 20 MG tablet Take 20 mg by mouth daily.      . calcium-vitamin D (OSCAL WITH D) 500-200 MG-UNIT per tablet Take 1 tablet by mouth daily.       . clonazePAM (KLONOPIN) 0.5 MG tablet Take 0.25 mg by mouth at bedtime as needed.       . cloNIDine (CATAPRES) 0.1 MG tablet Take 0.1 mg by mouth daily as needed. For blood pressure over 170      . docusate sodium (COLACE) 100 MG capsule Take 100 mg by mouth 2 (two) times daily.        Marland Kitchen labetalol (NORMODYNE) 200 MG tablet Take 300 mg by mouth 2 (two) times daily.      . Multiple Vitamins-Minerals (PRESERVISION/LUTEIN) CAPS Take 1 capsule by mouth 2 (two) times daily.       . polyethylene glycol (MIRALAX / GLYCOLAX) packet Take 17 g by mouth daily as needed. Constipation      . amLODipine (NORVASC) 2.5 MG tablet Take 1 tablet (2.5 mg total) by mouth daily.  180 tablet  3   No current facility-administered medications for this visit.    Past Medical History  Diagnosis Date  . Essential hypertension,  benign   . Coronary atherosclerosis of native coronary artery     Reportedly nonobstructive at catheterization in the 1990s  . Macular degeneration   . Anxiety disorder   . Diverticulosis     Left sided noted on colonoscopy 02/2006  . Chronic constipation   . Esophageal reflux disease   . Lumbar radiculopathy   . Hyperlipidemia   . Hiatal hernia   . Arthritis   . Penetrating atherosclerotic ulcer of aorta     Complex ulcerated plaque of the infrarenal abdominal aorta mild renal artery stenosis on the right side, normal left renal artery catheterization 2009, status post aortic stent graft  . Claudication     Chronic  occlusion of the right limb of aortic stent graft.  . Trigeminal neuralgia     Right  . Type 2 diabetes mellitus   . Anemia   . Migraines   . Renal artery stenosis     Right renal artery stent April 2013 - Dr. Fletcher Anon  . Ectopic atrial tachycardia     Past Surgical History  Procedure Laterality Date  . Carotid endarterectomy Left 1993  . Endologix powerlink bifurcated      Covered stent graft  . Tonsillectomy and adenoidectomy  1947  . Abdominal hysterectomy  1982  . Lumbar disc surgery  1981  . Breast biopsy Left 1990's    Family History  Problem Relation Age of Onset  . Heart failure Mother   . Coronary artery disease Mother   . Heart disease Mother   . Heart attack Father   . Heart disease Father   . Heart disease Brother     Social History Ms. Vaux reports that she quit smoking about 34 years ago. Her smoking use included Cigarettes. She has a .2 pack-year smoking history. She has never used smokeless tobacco. Ms. Ditmore reports that she does not drink alcohol.  Review of Systems No chest pain. Feels fatigued at times, leg weakness sometimes when she does her chores. No definite claudication. No syncope. Occasional palpitations. Otherwise negative.  Physical Examination Filed Vitals:   02/19/14 1333  BP: 211/107  Pulse: 85   Filed Weights   02/19/14 1327  Weight: 123 lb 1.9 oz (55.847 kg)   Patient in no distress. HEENT: Conjunctiva and lids normal, oropharynx clear. Neck: Supple, no elevated JVP or carotid bruits, no thyromegaly. Lungs: Clear to auscultation, nonlabored breathing at rest. Cardiac: Regular rate and rhythm, no S3 or significant systolic murmur, no pericardial rub. Abdomen: Soft, nontender, bowel sounds present. Extremities: No pitting edema, distal pulses 2+. Skin: Warm and dry. Musculoskeletal: No kyphosis. Neuropsychiatric: Alert and oriented x3, affect grossly appropriate.   Problem List and Plan   Uncontrolled hypertension Records  reviewed, blood pressure difficult to manage over the years as detailed above. Plan at this time is to continue current dose of labetalol, she states that she was unable to tolerate higher doses. Norvasc 2.5 mg be added in the evening, can be uptitrated further as tolerated. She also has clonidine to use as noted needed. She needs followup abdominal ultrasound with duplex imaging to reassess renal arteries, also visit with Dr. Fletcher Anon which will be arranged.  Penetrating atherosclerotic ulcer of aorta Status post placement of aortic stent graft. Known occlusion of the right limb of the graft.  Ectopic atrial tachycardia Reports intermittent palpitations. ECG is normal. No progression in symptoms.  DYSLIPIDEMIA Continues on Lipitor, followed by Dr. Woody Seller.  Coronary atherosclerosis of native coronary artery Reportedly nonobstructive disease at  cardiac catheterization in 1990s, ischemic workup in 2012 showed abnormal ST segment changes with Lexiscan, however overall low risk Cardiolite imaging with LVEF 64%.    [name], M.D., F.A.C.C.

## 2014-02-24 ENCOUNTER — Telehealth: Payer: Self-pay | Admitting: Cardiology

## 2014-02-24 NOTE — Telephone Encounter (Signed)
Patient c/o pain rated 6 located between shoulder blades, patient has taken tylenol with some relief. BP 181/92. Patient c/o sob. Nurse advised patient that she should go to the ED for evaluation. Patient said she would rather speak with a physician first. Patient said she would contact the doctor on call before proceeding to the ED.

## 2014-02-24 NOTE — Telephone Encounter (Signed)
[name] called in regards to seeing Dr. Fletcher Anon before April 28th. She is suppose to be having A renal procedure on the same date of seeing Dr. Fletcher Anon. She called the [facility] office And Was told that she could see a P.A. She does not want to see a P.A. States now that she is Having pains in her shoulder blades and that she might need to go to the ER.  She was advised To go to [facility] or [place] if she is able. She states she wants doctors to be able to see her Records.

## 2014-02-25 ENCOUNTER — Telehealth: Payer: Self-pay | Admitting: Cardiovascular Disease

## 2014-02-25 DIAGNOSIS — I701 Atherosclerosis of renal artery: Secondary | ICD-10-CM

## 2014-02-25 NOTE — Telephone Encounter (Signed)
New Message:  Pt states she needs to speak to a nurse. Pt states it is in reference to her BP. Pt is not wanting to give much information. Pt states she will give more details when the nurse calls her back.

## 2014-02-25 NOTE — Telephone Encounter (Signed)
Patient called stating that she is experiencing very highly elevated BP readings with constant/significant headache. Occasional nausea. States this has been occurring several days. BP yesterday at eye procedure was 224/90's and 199/112. Other readings this week = 181/92 and 178/70.  Today her BP is 179/100's and 219/90's range. She denies swelling, coughing or chest pain. States that she does take her Labetalol but she has never started her Norvasc. She also states that she has Clonidine prn for elevated BP but she does not take it usually. She states she is "afraid to take them because they might make me feel dizzy". Provided repeated education regarding correlation of high BP and risk for stroke. Patient verbalized understanding of this risk. She states she only wants to see Dr. Fletcher Anon and not another care provider. She states she does not want to go to ED. She states she would like Dr. Fletcher Anon to call her or see her in appointment very soon at the [facility] location. Patient was asked if she would consider being seen in the [facility] office if Dr. Fletcher Anon had a sooner appointment there. She declined and said she prefers Dr. Fletcher Anon to call her back to discuss it.  Triage nurse spoke to [facility] in [place], as Dr. Fletcher Anon is at that location today. She relayed information to Dr. Fletcher Anon. He stated to again advise patient to go to ED/emergent care and that he would call her at some point today between his scheduled patient visits.    I called patient back and provided this information to her from Dr. Fletcher Anon. I advised patient to call 911/go to ED for highly elevated BP and significant/constant headache with occasional nausea. She does not want to miss Dr. Tyrell Antonio phone call. She requested that I provide her cell number to Dr. Fletcher Anon, in case she does go to ED_ ([phone]).  I also encouraged her to take her medications, as ordered. She said she would consider taking the Norvasc and that she did take the Clonidine a  few minutes ago. Also advised that patient should call 911 and not drive, due to the high BP and the risks it poses.  Patient seemed willing to call 911 at this time.  Message sent to Dr. Fletcher Anon with patient's cell phone number.

## 2014-02-26 ENCOUNTER — Telehealth: Payer: Self-pay | Admitting: *Deleted

## 2014-02-26 ENCOUNTER — Encounter (HOSPITAL_COMMUNITY): Payer: Self-pay | Admitting: Emergency Medicine

## 2014-02-26 ENCOUNTER — Emergency Department (HOSPITAL_COMMUNITY)
Admission: EM | Admit: 2014-02-26 | Discharge: 2014-02-26 | Disposition: A | Discharge status: Home / Self Care | Payer: Medicare Other | Attending: Emergency Medicine | Admitting: Emergency Medicine

## 2014-02-26 ENCOUNTER — Emergency Department (HOSPITAL_COMMUNITY): Payer: Medicare Other

## 2014-02-26 DIAGNOSIS — R51 Headache: Secondary | ICD-10-CM | POA: Insufficient documentation

## 2014-02-26 DIAGNOSIS — M129 Arthropathy, unspecified: Secondary | ICD-10-CM | POA: Insufficient documentation

## 2014-02-26 DIAGNOSIS — Z87448 Personal history of other diseases of urinary system: Secondary | ICD-10-CM | POA: Insufficient documentation

## 2014-02-26 DIAGNOSIS — R5381 Other malaise: Secondary | ICD-10-CM | POA: Insufficient documentation

## 2014-02-26 DIAGNOSIS — I10 Essential (primary) hypertension: Secondary | ICD-10-CM | POA: Insufficient documentation

## 2014-02-26 DIAGNOSIS — Z862 Personal history of diseases of the blood and blood-forming organs and certain disorders involving the immune mechanism: Secondary | ICD-10-CM | POA: Insufficient documentation

## 2014-02-26 DIAGNOSIS — I2510 Atherosclerotic heart disease of native coronary artery without angina pectoris: Secondary | ICD-10-CM | POA: Insufficient documentation

## 2014-02-26 DIAGNOSIS — I701 Atherosclerosis of renal artery: Secondary | ICD-10-CM

## 2014-02-26 DIAGNOSIS — Z7982 Long term (current) use of aspirin: Secondary | ICD-10-CM | POA: Insufficient documentation

## 2014-02-26 DIAGNOSIS — Z8719 Personal history of other diseases of the digestive system: Secondary | ICD-10-CM | POA: Insufficient documentation

## 2014-02-26 DIAGNOSIS — E785 Hyperlipidemia, unspecified: Secondary | ICD-10-CM | POA: Insufficient documentation

## 2014-02-26 DIAGNOSIS — R5383 Other fatigue: Secondary | ICD-10-CM

## 2014-02-26 DIAGNOSIS — Z79899 Other long term (current) drug therapy: Secondary | ICD-10-CM | POA: Insufficient documentation

## 2014-02-26 DIAGNOSIS — G43909 Migraine, unspecified, not intractable, without status migrainosus: Secondary | ICD-10-CM | POA: Insufficient documentation

## 2014-02-26 DIAGNOSIS — Z8669 Personal history of other diseases of the nervous system and sense organs: Secondary | ICD-10-CM | POA: Insufficient documentation

## 2014-02-26 DIAGNOSIS — F411 Generalized anxiety disorder: Secondary | ICD-10-CM | POA: Insufficient documentation

## 2014-02-26 DIAGNOSIS — E860 Dehydration: Secondary | ICD-10-CM | POA: Insufficient documentation

## 2014-02-26 DIAGNOSIS — E119 Type 2 diabetes mellitus without complications: Secondary | ICD-10-CM | POA: Insufficient documentation

## 2014-02-26 DIAGNOSIS — Z87891 Personal history of nicotine dependence: Secondary | ICD-10-CM | POA: Insufficient documentation

## 2014-02-26 LAB — CBC WITH DIFFERENTIAL/PLATELET
BASOS ABS: 0 10*3/uL (ref 0.0–0.1)
Basophils Relative: 0 % (ref 0–1)
EOS PCT: 2 % (ref 0–5)
Eosinophils Absolute: 0.1 10*3/uL (ref 0.0–0.7)
HEMATOCRIT: 36.5 % (ref 36.0–46.0)
HEMOGLOBIN: 12.3 g/dL (ref 12.0–15.0)
Lymphocytes Relative: 24 % (ref 12–46)
Lymphs Abs: 0.9 10*3/uL (ref 0.7–4.0)
MCH: 28.9 pg (ref 26.0–34.0)
MCHC: 33.7 g/dL (ref 30.0–36.0)
MCV: 85.9 fL (ref 78.0–100.0)
MONO ABS: 0.4 10*3/uL (ref 0.1–1.0)
MONOS PCT: 10 % (ref 3–12)
NEUTROS ABS: 2.4 10*3/uL (ref 1.7–7.7)
Neutrophils Relative %: 64 % (ref 43–77)
Platelets: 189 10*3/uL (ref 150–400)
RBC: 4.25 MIL/uL (ref 3.87–5.11)
RDW: 13.7 % (ref 11.5–15.5)
WBC: 3.8 10*3/uL — ABNORMAL LOW (ref 4.0–10.5)

## 2014-02-26 LAB — COMPREHENSIVE METABOLIC PANEL
ALT: 18 U/L (ref 0–35)
AST: 21 U/L (ref 0–37)
Albumin: 4.1 g/dL (ref 3.5–5.2)
Alkaline Phosphatase: 77 U/L (ref 39–117)
BUN: 28 mg/dL — AB (ref 6–23)
CALCIUM: 10.5 mg/dL (ref 8.4–10.5)
CHLORIDE: 99 meq/L (ref 96–112)
CO2: 26 mEq/L (ref 19–32)
CREATININE: 1.31 mg/dL — AB (ref 0.50–1.10)
GFR calc non Af Amer: 39 mL/min — ABNORMAL LOW (ref >90)
GFR, EST AFRICAN AMERICAN: 45 mL/min — AB (ref >90)
Glucose, Bld: 116 mg/dL — ABNORMAL HIGH (ref 70–99)
Potassium: 4.2 mEq/L (ref 3.7–5.3)
Sodium: 137 mEq/L (ref 137–147)
Total Bilirubin: 0.3 mg/dL (ref 0.3–1.2)
Total Protein: 7.1 g/dL (ref 6.0–8.3)

## 2014-02-26 LAB — URINALYSIS, ROUTINE W REFLEX MICROSCOPIC
Bilirubin Urine: NEGATIVE
Glucose, UA: NEGATIVE mg/dL
Hgb urine dipstick: NEGATIVE
Ketones, ur: NEGATIVE mg/dL
LEUKOCYTES UA: NEGATIVE
NITRITE: NEGATIVE
Protein, ur: NEGATIVE mg/dL
UROBILINOGEN UA: 0.2 mg/dL (ref 0.0–1.0)
pH: 5.5 (ref 5.0–8.0)

## 2014-02-26 LAB — TROPONIN I

## 2014-02-26 NOTE — Telephone Encounter (Signed)
Spoke with patient and she says that she continues to feel worse. Patient said she felt more weak today and felt more weak in both arms. Patient said all she feels like doing is laying in bed. Patient said she didn't feel like doing anything. Nurse advised patient that since she's gotten worse that she needed to go to the ED for an evaluation. Nurse advised patient that if her problem was felt to be cardiac related, that a cardiologists from our practice would consult on her if she went to [facility]. Patient verbalized understanding of plan.

## 2014-02-26 NOTE — ED Provider Notes (Signed)
CSN: [id number]     Arrival date & time 02/26/14  1612 History  This chart was scribed for Maudry Diego, MD by Elby Beck, ED Scribe. This patient was seen in room APA07/APA07 and the patient's care was started at 4:38 PM.   Chief Complaint  Patient presents with  . Fatigue  . Hypertension    Patient is a 78 y.o. female presenting with weakness. The history is provided by the patient. No language interpreter was used.  Weakness This is a new problem. The current episode started yesterday. The problem occurs rarely. The problem has been gradually worsening. Associated symptoms include headaches. Pertinent negatives include no chest pain and no abdominal pain. Nothing aggravates the symptoms. Nothing relieves the symptoms. She has tried nothing for the symptoms. The treatment provided no relief.    HPI Comments: [name] is a 78 y.o. female with a history of HTN who presents to the Emergency Department complaining of generalized weakness over the past 2 days. She states that her blood pressure has been elevated at home over the past 2 days, as high as 229/110. Her blood pressure in triage was taken to be 176/92. She states that she is prescribed Labetalol, and that she had a recent dosage increase. She states that she seen by her PCP last week and was started on Norvasc to manage her blood pressure, in addition to Labetalol. She reports that she took her first dose of Norvasc this morning. She also states that she also takes Clonidine as needed, when her blood pressure is higher than 170. She states that she last took Clonidine about 2 hours ago. She states that she has had headaches intermittently "for years". She denies fever, chills, cough, urinary symptoms or any other symptoms   Past Medical History  Diagnosis Date  . Essential hypertension, benign   . Coronary atherosclerosis of native coronary artery     Reportedly nonobstructive at catheterization in the 1990s  . Macular  degeneration   . Anxiety disorder   . Diverticulosis     Left sided noted on colonoscopy 02/2006  . Chronic constipation   . Esophageal reflux disease   . Lumbar radiculopathy   . Hyperlipidemia   . Hiatal hernia   . Arthritis   . Penetrating atherosclerotic ulcer of aorta     Complex ulcerated plaque of the infrarenal abdominal aorta mild renal artery stenosis on the right side, normal left renal artery catheterization 2009, status post aortic stent graft  . Claudication     Chronic occlusion of the right limb of aortic stent graft.  . Trigeminal neuralgia     Right  . Type 2 diabetes mellitus   . Anemia   . Migraines   . Renal artery stenosis     Right renal artery stent April 2013 - Dr. Fletcher Anon  . Ectopic atrial tachycardia    Past Surgical History  Procedure Laterality Date  . Carotid endarterectomy Left 1993  . Endologix powerlink bifurcated      Covered stent graft  . Tonsillectomy and adenoidectomy  1947  . Abdominal hysterectomy  1982  . Lumbar disc surgery  1981  . Breast biopsy Left 1990's   Family History  Problem Relation Age of Onset  . Heart failure Mother   . Coronary artery disease Mother   . Heart disease Mother   . Heart attack Father   . Heart disease Father   . Heart disease Brother    History  Substance Use Topics  .  Smoking status: Former Smoker -- 0.20 packs/day for 1 years    Types: Cigarettes    Quit date: 11/13/1979  . Smokeless tobacco: Never Used  . Alcohol Use: No   OB History   Grav Para Term Preterm Abortions TAB SAB Ect Mult Living                 Review of Systems  Constitutional: Negative for fever, chills, appetite change and fatigue.  HENT: Negative for congestion, ear discharge and sinus pressure.   Eyes: Negative for discharge.  Respiratory: Negative for cough.   Cardiovascular: Negative for chest pain.  Gastrointestinal: Negative for abdominal pain and diarrhea.  Genitourinary: Negative for dysuria, urgency, frequency  and hematuria.  Musculoskeletal: Negative for back pain.  Skin: Negative for rash.  Neurological: Positive for weakness and headaches. Negative for seizures.  Psychiatric/Behavioral: Negative for hallucinations.      Allergies  Cefixime; Conjugated estrogens; Morphine and related; Codeine; Iohexol; and Adenosine  Home Medications   Prior to Admission medications   Medication Sig Start Date End Date Taking? Authorizing Provider  acetaminophen (TYLENOL) 325 MG tablet Take 325 mg by mouth every 6 (six) hours as needed. For pain.    Historical Provider, MD  amLODipine (NORVASC) 2.5 MG tablet Take 1 tablet (2.5 mg total) by mouth daily. 02/19/14   [name], MD  aspirin EC 81 MG tablet Take 81 mg by mouth daily.    Historical Provider, MD  atorvastatin (LIPITOR) 20 MG tablet Take 20 mg by mouth daily.    Historical Provider, MD  calcium-vitamin D (OSCAL WITH D) 500-200 MG-UNIT per tablet Take 1 tablet by mouth daily.     Historical Provider, MD  clonazePAM (KLONOPIN) 0.5 MG tablet Take 0.25 mg by mouth at bedtime as needed.     Historical Provider, MD  cloNIDine (CATAPRES) 0.1 MG tablet Take 0.1 mg by mouth daily as needed. For blood pressure over [place], MD  docusate sodium (COLACE) 100 MG capsule Take 100 mg by mouth 2 (two) times daily.      Historical Provider, MD  labetalol (NORMODYNE) 200 MG tablet Take 300 mg by mouth 2 (two) times daily. 10/30/12   Wellington Hampshire, MD  Multiple Vitamins-Minerals (PRESERVISION/LUTEIN) CAPS Take 1 capsule by mouth 2 (two) times daily.     Historical Provider, MD  polyethylene glycol (MIRALAX / GLYCOLAX) packet Take 17 g by mouth daily as needed. Constipation    Historical Provider, MD   Triage Vitals: BP 176/92  Pulse 78  Temp(Src) 98.3 F (36.8 C) (Oral)  Resp 16  Ht 5\' 1"  (1.549 m)  Wt 121 lb (54.885 kg)  BMI 22.87 kg/m2  SpO2 96%  Physical Exam  Constitutional: She is oriented to person, place, and time. She appears  well-developed.  HENT:  Head: Normocephalic.  Eyes: Conjunctivae and EOM are normal. No scleral icterus.  Neck: Neck supple. No thyromegaly present.  Cardiovascular: Normal rate and regular rhythm.  Exam reveals no gallop and no friction rub.   No murmur heard. Pulmonary/Chest: No stridor. She has no wheezes. She has no rales. She exhibits no tenderness.  Abdominal: She exhibits no distension. There is no tenderness. There is no rebound.  Musculoskeletal: Normal range of motion. She exhibits no edema.  Lymphadenopathy:    She has no cervical adenopathy.  Neurological: She is oriented to person, place, and time. She exhibits normal muscle tone. Coordination normal.  Skin: No rash noted. No  erythema.  Psychiatric: She has a normal mood and affect. Her behavior is normal.    ED Course  Procedures (including critical care time)  DIAGNOSTIC STUDIES: Oxygen Saturation is 96% on RA, normal by my interpretation.    COORDINATION OF CARE: 4:49 PM- Discussed plan to obtain diagnostic lab work and radiology. Pt advised of plan for treatment and pt agrees.  Labs Review Labs Reviewed  CBC WITH DIFFERENTIAL  COMPREHENSIVE METABOLIC PANEL  URINALYSIS, ROUTINE W REFLEX MICROSCOPIC  TROPONIN I    Imaging Review No results found.   EKG Interpretation None      MDM   Final diagnoses:  None   The chart was scribed for me under my direct supervision.  I personally performed the history, physical, and medical decision making and all procedures in the evaluation of this patient.Maudry Diego, MD 02/26/14 [place]

## 2014-02-26 NOTE — Discharge Instructions (Signed)
Follow up with your md next week and drink plenty of fluids

## 2014-02-26 NOTE — Telephone Encounter (Signed)
Called [name] in reference to her appointment for the Renal Arteries . I left a message asking Her to return my call.

## 2014-02-26 NOTE — Telephone Encounter (Signed)
I spoke with the patient last evening. BP improved to normal after taking Clonidine but went back up in the evening.  I instructed her to increase Labetalol to 2 tablets twice daily, start taking Amlodipine which was given by Dr. Domenic Polite and continue to use Clonidine q 8 hours prn. If symptomatic, I asked her to go to the ED.  Please schedule the patient for renal artery Duplex to be done in [place] office within a week. Follow up with me in [facility] st office on May 28 th at 9 am (add to my schedule).

## 2014-02-26 NOTE — ED Notes (Signed)
Generalized weakness and htn progressing this week.  Denies n/v.

## 2014-03-04 ENCOUNTER — Encounter (INDEPENDENT_AMBULATORY_CARE_PROVIDER_SITE_OTHER): Payer: Medicare Other

## 2014-03-04 DIAGNOSIS — I701 Atherosclerosis of renal artery: Secondary | ICD-10-CM

## 2014-03-04 DIAGNOSIS — I10 Essential (primary) hypertension: Secondary | ICD-10-CM

## 2014-03-05 NOTE — Telephone Encounter (Signed)
Renal duplex completed in [place] on 03/04/14.  Pt scheduled to see Dr Fletcher Anon 03/09/14.

## 2014-03-09 ENCOUNTER — Ambulatory Visit (INDEPENDENT_AMBULATORY_CARE_PROVIDER_SITE_OTHER): Payer: Medicare Other | Admitting: Cardiovascular Disease

## 2014-03-09 ENCOUNTER — Encounter: Payer: Self-pay | Admitting: Cardiovascular Disease

## 2014-03-09 VITALS — BP 182/110 | HR 80 | Ht 61.0 in | Wt 122.8 lb

## 2014-03-09 DIAGNOSIS — I701 Atherosclerosis of renal artery: Secondary | ICD-10-CM

## 2014-03-09 DIAGNOSIS — E785 Hyperlipidemia, unspecified: Secondary | ICD-10-CM

## 2014-03-09 DIAGNOSIS — I2510 Atherosclerotic heart disease of native coronary artery without angina pectoris: Secondary | ICD-10-CM

## 2014-03-09 LAB — CBC
HCT: 42.3 % (ref 36.0–46.0)
Hemoglobin: 14.1 g/dL (ref 12.0–15.0)
MCHC: 33.3 g/dL (ref 30.0–36.0)
MCV: 85.6 fl (ref 78.0–100.0)
Platelets: 248 10*3/uL (ref 150.0–400.0)
RBC: 4.95 Mil/uL (ref 3.87–5.11)
RDW: 14.6 % (ref 11.5–14.6)
WBC: 3.7 10*3/uL — AB (ref 4.5–10.5)

## 2014-03-09 LAB — BASIC METABOLIC PANEL
BUN: 19 mg/dL (ref 6–23)
CALCIUM: 11.1 mg/dL — AB (ref 8.4–10.5)
CO2: 28 meq/L (ref 19–32)
Chloride: 101 mEq/L (ref 96–112)
Creatinine, Ser: 0.8 mg/dL (ref 0.4–1.2)
GFR: 76.45 mL/min (ref >60.00)
Glucose, Bld: 95 mg/dL (ref 70–99)
Potassium: 4.1 mEq/L (ref 3.5–5.1)
SODIUM: 138 meq/L (ref 135–145)

## 2014-03-09 NOTE — Assessment & Plan Note (Signed)
The patient has recurrent severe hypertension with intolerance to multiple antihypertensive medications. Recent renal artery duplex ultrasound showed significant ostial in-stent restenosis with renal to aortic ratio greater than 7. Renal artery stenosis seems to be the culprit for her uncontrolled hypertension. Thus, I recommend proceeding with renal angiography and possible endovascular intervention. Right renal artery had about a downward takeoff and might be best approached from the radial route. For now continue treatment with labetalol, amlodipine and clonidine as needed.

## 2014-03-09 NOTE — Assessment & Plan Note (Signed)
The patient has known history of mild nonobstructive coronary artery disease on previous cardiac catheterization in the 90s. Currently she is having symptoms of substernal chest pain radiating to her back which is worrisome for new onset angina. Although this could be related to uncontrolled hypertension, she did not have these kind of symptoms in the past with high blood pressure. I discussed different options with her including cardiac catheterization versus stress testing. She reports significant side effects with pharmacologic stress test in the past. The best option is likely cardiac catheterization with possible coronary intervention. Risks, benefits and benefits were discussed.

## 2014-03-09 NOTE — Assessment & Plan Note (Signed)
Continue treatment with atorvastatin. 

## 2014-03-09 NOTE — Progress Notes (Signed)
HPI  This is a 78 year old female who is here today for a followup visit. She has known history of severe right renal artery stenosis status post stent placement in April of 2013. Her blood pressure improved significantly after that. She required only labetalol after stenting. However, over the last few months, she had significant worsening of blood pressure control with blood pressure as high as 412 systolic. She had a recent emergency room visit for elevated blood pressure. Labetalol was increased to 400 mg twice daily, amlodipine was added and she continues to use clonidine as needed if systolic blood pressure is above 160 mm of mercury. She also reports recent symptoms of substernal chest tightness radiating to her back. These usually last for a few minutes and has been happening both at rest and with physical activities. The patient has known occluded right iliac graft with no significant worsening of claudication.  Allergies  Allergen Reactions  . Cefixime Shortness Of Breath    Patient is not familiar with this listed allergy  . Conjugated Estrogens Other (See Comments)    REACTION: flushing  . Morphine And Related Other (See Comments)    Morphine injection Neck pain  . Codeine Other (See Comments)    REACTION: dizziness  . Iohexol      Desc: pt has anxiety and an irregular heartbeat. contrast exacerbates this. pt was very uncomfortable after a 20cc bolus for an miroi. we did w/o iv 11/09 per dr. Kris Hartmann. pt will talk w/ md about this and will probably not get iv contrast in the future at her re, Onset Date: [id number]   . Adenosine Other (See Comments)    Elevated heart rate when doing stress test     Current Outpatient Prescriptions on File Prior to Visit  Medication Sig Dispense Refill  . acetaminophen (TYLENOL) 325 MG tablet Take 325 mg by mouth every 6 (six) hours as needed. For pain.      Marland Kitchen amLODipine (NORVASC) 2.5 MG tablet Take 1 tablet (2.5 mg total) by mouth daily.  180  tablet  3  . aspirin EC 81 MG tablet Take 81 mg by mouth at bedtime.       Marland Kitchen atorvastatin (LIPITOR) 20 MG tablet Take 20 mg by mouth at bedtime.       . clonazePAM (KLONOPIN) 0.5 MG tablet Take 0.125-0.25 mg by mouth at bedtime as needed.       . docusate sodium (COLACE) 100 MG capsule Take 100 mg by mouth 2 (two) times daily.        Marland Kitchen labetalol (NORMODYNE) 200 MG tablet Take 400 mg by mouth 2 (two) times daily.       . Multiple Vitamins-Minerals (PRESERVISION/LUTEIN) CAPS Take 1 capsule by mouth 2 (two) times daily.       . polyethylene glycol (MIRALAX / GLYCOLAX) packet Take 17 g by mouth every morning. Constipation       No current facility-administered medications on file prior to visit.     Past Medical History  Diagnosis Date  . Essential hypertension, benign   . Coronary atherosclerosis of native coronary artery     Reportedly nonobstructive at catheterization in the 1990s  . Macular degeneration   . Anxiety disorder   . Diverticulosis     Left sided noted on colonoscopy 02/2006  . Chronic constipation   . Esophageal reflux disease   . Lumbar radiculopathy   . Hyperlipidemia   . Hiatal hernia   . Arthritis   .  Penetrating atherosclerotic ulcer of aorta     Complex ulcerated plaque of the infrarenal abdominal aorta mild renal artery stenosis on the right side, normal left renal artery catheterization 2009, status post aortic stent graft  . Claudication     Chronic occlusion of the right limb of aortic stent graft.  . Trigeminal neuralgia     Right  . Type 2 diabetes mellitus   . Anemia   . Migraines   . Renal artery stenosis     Right renal artery stent April 2013 - Dr. Fletcher Anon  . Ectopic atrial tachycardia      Past Surgical History  Procedure Laterality Date  . Carotid endarterectomy Left 1993  . Endologix powerlink bifurcated      Covered stent graft  . Tonsillectomy and adenoidectomy  1947  . Abdominal hysterectomy  1982  . Lumbar disc surgery  1981  . Breast  biopsy Left 1990's     Family History  Problem Relation Age of Onset  . Heart failure Mother   . Coronary artery disease Mother   . Heart disease Mother   . Heart attack Father   . Heart disease Father   . Heart disease Brother      History   Social History  . Marital Status: Married    Spouse Name: N/A    Number of Children: N/A  . Years of Education: N/A   Occupational History  . Retired     Scientist, clinical (histocompatibility and immunogenetics) at [employer] History Main Topics  . Smoking status: Former Smoker -- 0.20 packs/day for 1 years    Types: Cigarettes    Quit date: 11/13/1979  . Smokeless tobacco: Never Used  . Alcohol Use: No  . Drug Use: No  . Sexual Activity: No   Other Topics Concern  . Not on file   Social History Narrative  . No narrative on file     PHYSICAL EXAM   BP 182/110  Pulse 80  Ht 5\' 1"  (1.549 m)  Wt 122 lb 12.8 oz (55.702 kg)  BMI 23.21 kg/m2  SpO2 99% Constitutional: She is oriented to person, place, and time. She appears well-developed and well-nourished. No distress.  HENT: No nasal discharge.  Head: Normocephalic and atraumatic.  Eyes: Pupils are equal and round. Right eye exhibits no discharge. Left eye exhibits no discharge.  Neck: Normal range of motion. Neck supple. No JVD present. No thyromegaly present.  Cardiovascular: Normal rate, regular rhythm, normal heart sounds. Exam reveals no gallop and no friction rub. No murmur heard.  Pulmonary/Chest: Effort normal and breath sounds normal. No stridor. No respiratory distress. She has no wheezes. She has no rales. She exhibits no tenderness.  Abdominal: Soft. Bowel sounds are normal. She exhibits no distension. There is no tenderness. There is no rebound and no guarding.  Musculoskeletal: Normal range of motion. She exhibits no edema and no tenderness.  Neurological: She is alert and oriented to person, place, and time. Coordination normal.  Skin: Skin is warm and dry. No rash noted. She is not diaphoretic. No  erythema. No pallor.  Psychiatric: She has a normal mood and affect. Her behavior is normal. Judgment and thought content normal.    Recent EKG on April 17 showed normal sinus rhythm with poor R-wave progression in the inferior leads.  ASSESSMENT AND PLAN

## 2014-03-09 NOTE — Patient Instructions (Signed)
Your physician has requested that you have a cardiac catheterization. Cardiac catheterization is used to diagnose and/or treat various heart conditions. Doctors may recommend this procedure for a number of different reasons. The most common reason is to evaluate chest pain. Chest pain can be a symptom of coronary artery disease (CAD), and cardiac catheterization can show whether plaque is narrowing or blocking your heart's arteries. This procedure is also used to evaluate the valves, as well as measure the blood flow and oxygen levels in different parts of your heart. For further information please visit [email or web address]. Please follow instruction sheet, as given.  Your physician has requested that you have a peripheral vascular angiogram. This exam is performed at the hospital. During this exam IV contrast is used to look at arterial blood flow. Please review the information sheet given for details.  Your physician recommends that you continue on your current medications as directed. Please refer to the Current Medication list given to you today.  Your physician recommends that you have lab work today: BMP, CBC and PT/INR

## 2014-03-10 ENCOUNTER — Encounter (HOSPITAL_COMMUNITY): Payer: Self-pay | Admitting: Pharmacy Technician

## 2014-03-10 ENCOUNTER — Telehealth: Payer: Self-pay | Admitting: *Deleted

## 2014-03-10 NOTE — Telephone Encounter (Signed)
Patient saw Dr Fletcher Anon yesterday and stated that she was supposed to be started on plavix, but it was not sent in. I do not see anywhere in the notes that she was to start this. Please advise. Thanks, MI

## 2014-03-10 NOTE — Telephone Encounter (Signed)
I decided not to start Plavix until I do the angiogram.

## 2014-03-11 NOTE — Telephone Encounter (Signed)
Pt aware.

## 2014-03-17 ENCOUNTER — Ambulatory Visit (HOSPITAL_COMMUNITY)
Admission: RE | Admit: 2014-03-17 | Discharge: 2014-03-18 | Disposition: A | Discharge status: Home / Self Care | Payer: Medicare Other | Source: Ambulatory Visit | Attending: Cardiovascular Disease | Admitting: Cardiovascular Disease

## 2014-03-17 ENCOUNTER — Encounter (HOSPITAL_COMMUNITY)
Admission: RE | Disposition: A | Discharge status: Home / Self Care | Payer: Self-pay | Source: Ambulatory Visit | Attending: Cardiovascular Disease

## 2014-03-17 DIAGNOSIS — H3530 Unspecified macular degeneration: Secondary | ICD-10-CM | POA: Diagnosis not present

## 2014-03-17 DIAGNOSIS — R000 Tachycardia, unspecified: Secondary | ICD-10-CM | POA: Diagnosis not present

## 2014-03-17 DIAGNOSIS — Z87891 Personal history of nicotine dependence: Secondary | ICD-10-CM | POA: Diagnosis not present

## 2014-03-17 DIAGNOSIS — E7800 Pure hypercholesterolemia, unspecified: Secondary | ICD-10-CM | POA: Diagnosis present

## 2014-03-17 DIAGNOSIS — M129 Arthropathy, unspecified: Secondary | ICD-10-CM | POA: Insufficient documentation

## 2014-03-17 DIAGNOSIS — K449 Diaphragmatic hernia without obstruction or gangrene: Secondary | ICD-10-CM | POA: Insufficient documentation

## 2014-03-17 DIAGNOSIS — K219 Gastro-esophageal reflux disease without esophagitis: Secondary | ICD-10-CM | POA: Diagnosis not present

## 2014-03-17 DIAGNOSIS — E785 Hyperlipidemia, unspecified: Secondary | ICD-10-CM | POA: Insufficient documentation

## 2014-03-17 DIAGNOSIS — I719 Aortic aneurysm of unspecified site, without rupture: Secondary | ICD-10-CM | POA: Diagnosis not present

## 2014-03-17 DIAGNOSIS — E119 Type 2 diabetes mellitus without complications: Secondary | ICD-10-CM | POA: Insufficient documentation

## 2014-03-17 DIAGNOSIS — G43909 Migraine, unspecified, not intractable, without status migrainosus: Secondary | ICD-10-CM | POA: Insufficient documentation

## 2014-03-17 DIAGNOSIS — G500 Trigeminal neuralgia: Secondary | ICD-10-CM | POA: Insufficient documentation

## 2014-03-17 DIAGNOSIS — I701 Atherosclerosis of renal artery: Secondary | ICD-10-CM | POA: Diagnosis present

## 2014-03-17 DIAGNOSIS — I10 Essential (primary) hypertension: Secondary | ICD-10-CM | POA: Diagnosis not present

## 2014-03-17 DIAGNOSIS — K5900 Constipation, unspecified: Secondary | ICD-10-CM | POA: Diagnosis not present

## 2014-03-17 DIAGNOSIS — I700 Atherosclerosis of aorta: Secondary | ICD-10-CM

## 2014-03-17 DIAGNOSIS — F411 Generalized anxiety disorder: Secondary | ICD-10-CM | POA: Insufficient documentation

## 2014-03-17 DIAGNOSIS — Z7982 Long term (current) use of aspirin: Secondary | ICD-10-CM | POA: Diagnosis not present

## 2014-03-17 DIAGNOSIS — I2510 Atherosclerotic heart disease of native coronary artery without angina pectoris: Secondary | ICD-10-CM | POA: Insufficient documentation

## 2014-03-17 DIAGNOSIS — I745 Embolism and thrombosis of iliac artery: Secondary | ICD-10-CM | POA: Insufficient documentation

## 2014-03-17 DIAGNOSIS — I70219 Atherosclerosis of native arteries of extremities with intermittent claudication, unspecified extremity: Secondary | ICD-10-CM | POA: Diagnosis not present

## 2014-03-17 HISTORY — PX: RENAL ANGIOGRAM: SHX5509

## 2014-03-17 HISTORY — PX: PERCUTANEOUS STENT INTERVENTION: SHX5500

## 2014-03-17 HISTORY — PX: LEFT HEART CATHETERIZATION WITH CORONARY ANGIOGRAM: SHX5451

## 2014-03-17 HISTORY — PX: ABDOMINAL AORTAGRAM: SHX5454

## 2014-03-17 LAB — GLUCOSE, CAPILLARY
GLUCOSE-CAPILLARY: 103 mg/dL — AB (ref 70–99)
Glucose-Capillary: 126 mg/dL — ABNORMAL HIGH (ref 70–99)
Glucose-Capillary: 108 mg/dL — ABNORMAL HIGH (ref 70–99)

## 2014-03-17 LAB — POCT ACTIVATED CLOTTING TIME
Activated Clotting Time: 194 seconds
Activated Clotting Time: 205 seconds

## 2014-03-17 LAB — PROTIME-INR
INR: 0.86 (ref 0.00–1.49)
PROTHROMBIN TIME: 11.6 s (ref 11.6–15.2)

## 2014-03-17 LAB — PLATELET COUNT: PLATELETS: 155 10*3/uL (ref 150–400)

## 2014-03-17 SURGERY — ABDOMINAL AORTAGRAM
Anesthesia: LOCAL | Laterality: Right

## 2014-03-17 MED ORDER — HYDRALAZINE HCL 20 MG/ML IJ SOLN
INTRAMUSCULAR | Status: AC
  Filled 2014-03-17: qty 1

## 2014-03-17 MED ORDER — DOCUSATE SODIUM 100 MG PO CAPS
100.0000 mg | ORAL_CAPSULE | Freq: Two times a day (BID) | ORAL | Status: DC
  Administered 2014-03-17 – 2014-03-18 (×2): 100 mg via ORAL
  Filled 2014-03-17 (×3): qty 1

## 2014-03-17 MED ORDER — HEPARIN (PORCINE) IN NACL 2-0.9 UNIT/ML-% IJ SOLN
INTRAMUSCULAR | Status: AC
  Filled 2014-03-17: qty 1000

## 2014-03-17 MED ORDER — ASPIRIN 81 MG PO CHEW
81.0000 mg | CHEWABLE_TABLET | ORAL | Status: AC
  Administered 2014-03-17: 81 mg via ORAL
  Filled 2014-03-17: qty 1

## 2014-03-17 MED ORDER — SODIUM CHLORIDE 0.9 % IJ SOLN: 3.0000 mL | INTRAMUSCULAR | Status: DC | PRN

## 2014-03-17 MED ORDER — CLOPIDOGREL BISULFATE 300 MG PO TABS
ORAL_TABLET | ORAL | Status: AC
  Filled 2014-03-17: qty 1

## 2014-03-17 MED ORDER — CLONAZEPAM 0.5 MG PO TABS
0.2500 mg | ORAL_TABLET | Freq: Three times a day (TID) | ORAL | Status: DC | PRN
  Administered 2014-03-17: 22:00:00 0.25 mg via ORAL
  Filled 2014-03-17: qty 1

## 2014-03-17 MED ORDER — LIDOCAINE HCL (PF) 1 % IJ SOLN
INTRAMUSCULAR | Status: AC
  Filled 2014-03-17: qty 30

## 2014-03-17 MED ORDER — POLYETHYLENE GLYCOL 3350 17 G PO PACK
17.0000 g | PACK | Freq: Every morning | ORAL | Status: DC
  Filled 2014-03-17: qty 1

## 2014-03-17 MED ORDER — SODIUM CHLORIDE 0.9 % IV SOLN: INTRAVENOUS | Status: AC

## 2014-03-17 MED ORDER — ATORVASTATIN CALCIUM 20 MG PO TABS
20.0000 mg | ORAL_TABLET | Freq: Every day | ORAL | Status: DC
  Administered 2014-03-17: 20 mg via ORAL
  Filled 2014-03-17 (×2): qty 1

## 2014-03-17 MED ORDER — HEPARIN (PORCINE) IN NACL 2-0.9 UNIT/ML-% IJ SOLN
INTRAMUSCULAR | Status: AC
Start: 2014-03-17 — End: 2014-03-17
  Filled 2014-03-17: qty 1000

## 2014-03-17 MED ORDER — MIDAZOLAM HCL 2 MG/2ML IJ SOLN
INTRAMUSCULAR | Status: AC
  Filled 2014-03-17: qty 2

## 2014-03-17 MED ORDER — SODIUM CHLORIDE 0.9 % IV SOLN: 250.0000 mL | INTRAVENOUS | Status: DC | PRN

## 2014-03-17 MED ORDER — LABETALOL HCL 300 MG PO TABS
300.0000 mg | ORAL_TABLET | Freq: Two times a day (BID) | ORAL | Status: DC
  Administered 2014-03-17 – 2014-03-18 (×2): 300 mg via ORAL
  Filled 2014-03-17 (×3): qty 1

## 2014-03-17 MED ORDER — SODIUM CHLORIDE 0.9 % IJ SOLN: 3.0000 mL | Freq: Two times a day (BID) | INTRAMUSCULAR | Status: DC

## 2014-03-17 MED ORDER — CLOPIDOGREL BISULFATE 75 MG PO TABS
75.0000 mg | ORAL_TABLET | Freq: Every day | ORAL | Status: DC
  Administered 2014-03-18: 75 mg via ORAL
  Filled 2014-03-17: qty 1

## 2014-03-17 MED ORDER — ASPIRIN EC 81 MG PO TBEC
81.0000 mg | DELAYED_RELEASE_TABLET | Freq: Every day | ORAL | Status: DC
  Filled 2014-03-17 (×2): qty 1

## 2014-03-17 MED ORDER — SODIUM CHLORIDE 0.9 % IV SOLN
INTRAVENOUS | Status: DC
  Administered 2014-03-17: 11:00:00 via INTRAVENOUS

## 2014-03-17 MED ORDER — CALCIUM CARBONATE 600 MG PO TABS
600.0000 mg | ORAL_TABLET | Freq: Two times a day (BID) | ORAL | Status: DC
  Filled 2014-03-17: qty 1

## 2014-03-17 MED ORDER — CALCIUM CARBONATE 1250 (500 CA) MG PO TABS
1.0000 | ORAL_TABLET | Freq: Two times a day (BID) | ORAL | Status: DC
  Filled 2014-03-17 (×3): qty 1

## 2014-03-17 MED ORDER — CLONIDINE HCL 0.1 MG PO TABS
0.1000 mg | ORAL_TABLET | Freq: Three times a day (TID) | ORAL | Status: DC | PRN
  Filled 2014-03-17: qty 1

## 2014-03-17 MED ORDER — ACETAMINOPHEN 325 MG PO TABS: 650.0000 mg | ORAL_TABLET | Freq: Four times a day (QID) | ORAL | Status: DC | PRN

## 2014-03-17 MED ORDER — FENTANYL CITRATE 0.05 MG/ML IJ SOLN
INTRAMUSCULAR | Status: AC
  Filled 2014-03-17: qty 2

## 2014-03-17 MED ORDER — LABETALOL HCL 5 MG/ML IV SOLN
INTRAVENOUS | Status: AC
  Filled 2014-03-17: qty 4

## 2014-03-17 NOTE — Interval H&P Note (Signed)
History and Physical Interval Note:  03/17/2014 12:26 PM  [name]  has presented today for surgery, with the diagnosis of cad, renal artery stenosis  The various methods of treatment have been discussed with the patient and family. After consideration of risks, benefits and other options for treatment, the patient has consented to  Procedure(s): ABDOMINAL AORTAGRAM (N/A) RENAL ANGIOGRAM (N/A) LEFT HEART CATHETERIZATION WITH CORONARY ANGIOGRAM (N/A) as a surgical intervention .  The patient's history has been reviewed, patient examined, no change in status, stable for surgery.  I have reviewed the patient's chart and labs.  Questions were answered to the patient's satisfaction.     [name]

## 2014-03-17 NOTE — CV Procedure (Signed)
Cardiac Catheterization Procedure Note  Name: [name] MRN: [id number] DOB: 15-Feb-1936  Procedure: Left Heart Cath, Selective Coronary Angiography, abdominal aortogram, selective right renal artery angiography, right renal artery angioplasty and stent placement.  Indication: Please refer to my office note. The patient has a known history of refractory hypertension with previous right renal artery stenting in 2013. She presented with worsening blood pressure control with systolic blood pressure reaching 200. There was evidence of severe ostial in-stent restenosis on renal artery duplex. She also reported recent symptoms of substernal chest tightness which was worrisome for new onset angina. Thus, I recommend proceeding with cardiac catheterization as well as renal angiography and possible stenting.  Medications:  Sedation:  3 mg IV Versed,  75 mcg IV Fentanyl  Contrast:  86 mL Visipaque She was also given 20 mg of IV labetalol and 20 mg of IV hydralazine for severely elevated blood pressure.  Procedural details: The left  groin was prepped, draped, and anesthetized with 1% lidocaine. Using modified Seldinger technique, a  6 French sheath was introduced into the right femoral artery. Standard Judkins catheters were used for coronary angiography . Left ventricular angiography was not performed due to severely elevated blood pressure . Catheter exchanges were performed over a guidewire. There were no immediate procedural complications.    Procedural Findings:  Hemodynamics: AO:  209/90   mmHg LV:  206/22    mmHg LVEDP:  44  mmHg  Coronary angiography: Coronary dominance:  Right   Left Main:  The left coronary system is heavily calcified. The left main is short with minor irregularities.  Left Anterior Descending (LAD):  Normal in size. There is 30-40% proximal tubular stenosis followed by 40% disease in the midsegment. Minor irregularities are noted distally.  1st diagonal (D1):   Normal in size with 20% ostial stenosis.  2nd diagonal (D2):  Medium in size with 50% ostial and proximal stenosis.  3rd diagonal (D3):  Very small in size.  Circumflex (LCx):  Normal in size and nondominant. The vessel has mild diffuse atherosclerosis without obstructive disease.  1st obtuse marginal:  Very small in size.  2nd obtuse marginal:  Very small in size.  3rd obtuse marginal:  Normal in size with no significant disease.    Right Coronary Artery:  Severely calcified and tortuous. There is 20-30% proximal stenosis and 40% mid stenosis. The rest of the vessel has minor irregularities.  Posterior descending artery:  Normal in size with minor irregularities.  Posterior AV segment:  Normal in size without significant disease.  Posterolateral branchs:  PL 1 is very small in size. PL 2 is normal in size with minor irregularities.  Left ventriculography:  Was not performed.  PV procedure note:: Abdominal aortogram and nonselective renal angiography was performed with a pigtail catheter in the AP position. This showed 90% ostial stenosis in the right renal artery. It appears that the previously placed stent was 1-2 mm short of the ostium. A stent graft is noted in the aorta. It appears to be intact. The right iliac limb is known to be occluded. The patient was given 3500 of unfractionated heparin followed by another 1500 units of heparin to achieve an ACT slightly above 200. I used an IM 6 [place] guide to engage the right renal artery. Selective angiography was performed. The lesion was crossed with a [place] core wire. I then used a 5 x 15 mm balloon to predilate the lesion. I decided to place another stent in order to  cover the ostium and used a 6 x 18 mm Herculink stent which was deployed to 10 atmosphere. The balloon was pulled back and inflated again at the ostium to 12 atmospheres. Final angiography showed excellent results. The catheter and wire were removed. The site was closed  with a Mynx device.  Final Conclusions:   1. Mild to moderate nonobstructive coronary artery disease with heavily calcified vessels. 2. Severely elevated blood pressure of and left ventricular end-diastolic pressure. 3. Severe ostial right renal artery stenosis. 4. Successful angioplasty and stent placement to the right renal artery.  Recommendations:  Observed overnight with hydration. The patient tends to have very labile blood pressure. Continue Plavix for at least one month.  Wellington Hampshire MD, [facility] 03/17/2014, 1:56 PM

## 2014-03-17 NOTE — H&P (View-Only) (Signed)
HPI  This is a 78 year old female who is here today for a followup visit. She has known history of severe right renal artery stenosis status post stent placement in April of 2013. Her blood pressure improved significantly after that. She required only labetalol after stenting. However, over the last few months, she had significant worsening of blood pressure control with blood pressure as high as 412 systolic. She had a recent emergency room visit for elevated blood pressure. Labetalol was increased to 400 mg twice daily, amlodipine was added and she continues to use clonidine as needed if systolic blood pressure is above 160 mm of mercury. She also reports recent symptoms of substernal chest tightness radiating to her back. These usually last for a few minutes and has been happening both at rest and with physical activities. The patient has known occluded right iliac graft with no significant worsening of claudication.  Allergies  Allergen Reactions  . Cefixime Shortness Of Breath    Patient is not familiar with this listed allergy  . Conjugated Estrogens Other (See Comments)    REACTION: flushing  . Morphine And Related Other (See Comments)    Morphine injection Neck pain  . Codeine Other (See Comments)    REACTION: dizziness  . Iohexol      Desc: pt has anxiety and an irregular heartbeat. contrast exacerbates this. pt was very uncomfortable after a 20cc bolus for an miroi. we did w/o iv 11/09 per dr. Kris Hartmann. pt will talk w/ md about this and will probably not get iv contrast in the future at her re, Onset Date: [id number]   . Adenosine Other (See Comments)    Elevated heart rate when doing stress test     Current Outpatient Prescriptions on File Prior to Visit  Medication Sig Dispense Refill  . acetaminophen (TYLENOL) 325 MG tablet Take 325 mg by mouth every 6 (six) hours as needed. For pain.      Marland Kitchen amLODipine (NORVASC) 2.5 MG tablet Take 1 tablet (2.5 mg total) by mouth daily.  180  tablet  3  . aspirin EC 81 MG tablet Take 81 mg by mouth at bedtime.       Marland Kitchen atorvastatin (LIPITOR) 20 MG tablet Take 20 mg by mouth at bedtime.       . clonazePAM (KLONOPIN) 0.5 MG tablet Take 0.125-0.25 mg by mouth at bedtime as needed.       . docusate sodium (COLACE) 100 MG capsule Take 100 mg by mouth 2 (two) times daily.        Marland Kitchen labetalol (NORMODYNE) 200 MG tablet Take 400 mg by mouth 2 (two) times daily.       . Multiple Vitamins-Minerals (PRESERVISION/LUTEIN) CAPS Take 1 capsule by mouth 2 (two) times daily.       . polyethylene glycol (MIRALAX / GLYCOLAX) packet Take 17 g by mouth every morning. Constipation       No current facility-administered medications on file prior to visit.     Past Medical History  Diagnosis Date  . Essential hypertension, benign   . Coronary atherosclerosis of native coronary artery     Reportedly nonobstructive at catheterization in the 1990s  . Macular degeneration   . Anxiety disorder   . Diverticulosis     Left sided noted on colonoscopy 02/2006  . Chronic constipation   . Esophageal reflux disease   . Lumbar radiculopathy   . Hyperlipidemia   . Hiatal hernia   . Arthritis   .  Penetrating atherosclerotic ulcer of aorta     Complex ulcerated plaque of the infrarenal abdominal aorta mild renal artery stenosis on the right side, normal left renal artery catheterization 2009, status post aortic stent graft  . Claudication     Chronic occlusion of the right limb of aortic stent graft.  . Trigeminal neuralgia     Right  . Type 2 diabetes mellitus   . Anemia   . Migraines   . Renal artery stenosis     Right renal artery stent April 2013 - Dr. Fletcher Anon  . Ectopic atrial tachycardia      Past Surgical History  Procedure Laterality Date  . Carotid endarterectomy Left 1993  . Endologix powerlink bifurcated      Covered stent graft  . Tonsillectomy and adenoidectomy  1947  . Abdominal hysterectomy  1982  . Lumbar disc surgery  1981  . Breast  biopsy Left 1990's     Family History  Problem Relation Age of Onset  . Heart failure Mother   . Coronary artery disease Mother   . Heart disease Mother   . Heart attack Father   . Heart disease Father   . Heart disease Brother      History   Social History  . Marital Status: Married    Spouse Name: N/A    Number of Children: N/A  . Years of Education: N/A   Occupational History  . Retired     Scientist, clinical (histocompatibility and immunogenetics) at [employer] History Main Topics  . Smoking status: Former Smoker -- 0.20 packs/day for 1 years    Types: Cigarettes    Quit date: 11/13/1979  . Smokeless tobacco: Never Used  . Alcohol Use: No  . Drug Use: No  . Sexual Activity: No   Other Topics Concern  . Not on file   Social History Narrative  . No narrative on file     PHYSICAL EXAM   BP 182/110  Pulse 80  Ht 5\' 1"  (1.549 m)  Wt 122 lb 12.8 oz (55.702 kg)  BMI 23.21 kg/m2  SpO2 99% Constitutional: She is oriented to person, place, and time. She appears well-developed and well-nourished. No distress.  HENT: No nasal discharge.  Head: Normocephalic and atraumatic.  Eyes: Pupils are equal and round. Right eye exhibits no discharge. Left eye exhibits no discharge.  Neck: Normal range of motion. Neck supple. No JVD present. No thyromegaly present.  Cardiovascular: Normal rate, regular rhythm, normal heart sounds. Exam reveals no gallop and no friction rub. No murmur heard.  Pulmonary/Chest: Effort normal and breath sounds normal. No stridor. No respiratory distress. She has no wheezes. She has no rales. She exhibits no tenderness.  Abdominal: Soft. Bowel sounds are normal. She exhibits no distension. There is no tenderness. There is no rebound and no guarding.  Musculoskeletal: Normal range of motion. She exhibits no edema and no tenderness.  Neurological: She is alert and oriented to person, place, and time. Coordination normal.  Skin: Skin is warm and dry. No rash noted. She is not diaphoretic. No  erythema. No pallor.  Psychiatric: She has a normal mood and affect. Her behavior is normal. Judgment and thought content normal.    Recent EKG on April 17 showed normal sinus rhythm with poor R-wave progression in the inferior leads.  ASSESSMENT AND PLAN

## 2014-03-17 NOTE — Progress Notes (Signed)
Per Barbaraann Rondo in  cath lab, labs pending, ok to bring pt to cath lab

## 2014-03-18 ENCOUNTER — Encounter (HOSPITAL_COMMUNITY): Payer: Self-pay | Admitting: Nurse Practitioner

## 2014-03-18 ENCOUNTER — Other Ambulatory Visit: Payer: Self-pay | Admitting: Nurse Practitioner

## 2014-03-18 DIAGNOSIS — I2510 Atherosclerotic heart disease of native coronary artery without angina pectoris: Secondary | ICD-10-CM

## 2014-03-18 DIAGNOSIS — I701 Atherosclerosis of renal artery: Secondary | ICD-10-CM

## 2014-03-18 LAB — BASIC METABOLIC PANEL
BUN: 14 mg/dL (ref 6–23)
CO2: 22 meq/L (ref 19–32)
Calcium: 9.9 mg/dL (ref 8.4–10.5)
Chloride: 104 mEq/L (ref 96–112)
Creatinine, Ser: 0.76 mg/dL (ref 0.50–1.10)
GFR calc Af Amer: >90 mL/min (ref >90)
GFR, EST NON AFRICAN AMERICAN: 80 mL/min — AB (ref >90)
GLUCOSE: 108 mg/dL — AB (ref 70–99)
Potassium: 3.6 mEq/L — ABNORMAL LOW (ref 3.7–5.3)
SODIUM: 140 meq/L (ref 137–147)

## 2014-03-18 LAB — CBC
HCT: 36.6 % (ref 36.0–46.0)
Hemoglobin: 11.9 g/dL — ABNORMAL LOW (ref 12.0–15.0)
MCH: 28.1 pg (ref 26.0–34.0)
MCHC: 32.5 g/dL (ref 30.0–36.0)
MCV: 86.5 fL (ref 78.0–100.0)
Platelets: 178 10*3/uL (ref 150–400)
RBC: 4.23 MIL/uL (ref 3.87–5.11)
RDW: 14.1 % (ref 11.5–15.5)
WBC: 5.6 10*3/uL (ref 4.0–10.5)

## 2014-03-18 LAB — GLUCOSE, CAPILLARY: GLUCOSE-CAPILLARY: 106 mg/dL — AB (ref 70–99)

## 2014-03-18 MED ORDER — CLOPIDOGREL BISULFATE 75 MG PO TABS: 75.0000 mg | ORAL_TABLET | Freq: Every day | ORAL | Status: DC

## 2014-03-18 NOTE — Discharge Instructions (Signed)

## 2014-03-18 NOTE — Discharge Summary (Signed)
Discharge Summary   Patient ID: [name],  MRN: [id number], DOB/AGE: 76-Aug-1937 76 y.o.  Admit date: 03/17/2014 Discharge date: 03/18/2014  Primary Care Provider: VYAS,DHRUV B. Primary Cardiologist: Myles Gip, MD / PV: M. Fletcher Anon, MD   Discharge Diagnoses Principal Problem:   RAS (renal artery stenosis)  **Status post successful PTA and stenting of the right renal artery this admission.  Active Problems:   Uncontrolled hypertension   DYSLIPIDEMIA   Coronary atherosclerosis of native coronary artery  **Non-obstructive by catheterization this admission.   Penetrating atherosclerotic ulcer of aorta  **S/P prior stent grafting.  Allergies Allergies  Allergen Reactions  . Cefixime Shortness Of Breath    Patient is not familiar with this listed allergy  . Conjugated Estrogens Other (See Comments)    REACTION: flushing  . Morphine And Related Other (See Comments)    Morphine injection Neck pain  . Codeine Other (See Comments)    REACTION: dizziness  . Iohexol      Desc: pt has anxiety and an irregular heartbeat. contrast exacerbates this. pt was very uncomfortable after a 20cc bolus for an miroi. we did w/o iv 11/09 per dr. Kris Hartmann. pt will talk w/ md about this and will probably not get iv contrast in the future at her re, Onset Date: [id number]   . Adenosine Other (See Comments)    Elevated heart rate when doing stress test   Procedures  Cardiac Catheterization and Abdominal/renal artery angiography and stenting 5.6.2015  Hemodynamics: AO:  209/90   mmHg LV:  206/22    mmHg LVEDP:  44  mmHg  Coronary angiography: Coronary dominance:  Right     Left Main:  The left coronary system is heavily calcified. The left main is short with minor irregularities.  Left Anterior Descending (LAD):  Normal in size. There is 30-40% proximal tubular stenosis followed by 40% disease in the midsegment. Minor irregularities are noted distally.  1st diagonal (D1):  Normal in size with  20% ostial stenosis.  2nd diagonal (D2):  Medium in size with 50% ostial and proximal stenosis.  3rd diagonal (D3):  Very small in size.  Circumflex (LCx):  Normal in size and nondominant. The vessel has mild diffuse atherosclerosis without obstructive disease.  1st obtuse marginal:  Very small in size.  2nd obtuse marginal:  Very small in size.  3rd obtuse marginal:  Normal in size with no significant disease.                Right Coronary Artery:  Severely calcified and tortuous. There is 20-30% proximal stenosis and 40% mid stenosis. The rest of the vessel has minor irregularities.  Posterior descending artery:  Normal in size with minor irregularities.  Posterior AV segment:  Normal in size without significant disease.  Posterolateral branchs:  PL 1 is very small in size. PL 2 is normal in size with minor irregularities.  Left ventriculography:  Was not performed.  PV procedure note: Abdominal aortogram and nonselective renal angiography was performed with a pigtail catheter in the AP position. This showed 90% ostial stenosis in the right renal artery. It appears that the previously placed stent was 1-2 mm short of the ostium. A stent graft is noted in the aorta. It appears to be intact. The right iliac limb is known to be occluded.    **The Right Renal Artery was successfully stented using a 6.0 x 18 mm Herculink stent.** _____________   History of Present Illness  78 year old female with a prior history  of nonobstructive coronary artery disease, difficult to control hypertension, aortic ulceration status post stent grafting, and right renal artery stenosis status post prior stenting. She was recently seen in peripheral vascular clinic secondary to worsening of blood pressure and chest discomfort. Renal arterial duplex was performed and showed severe ostial in-stent restenosis within the right renal artery. Decision was made to perform repeat angiography including diagnostic  cardiac catheterization.  Hospital Course  Patient presented to the [facility] peripheral vascular laboratory on 03/17/2014. She underwent cardiac catheterization revealing moderate nonobstructive coronary artery disease. Peripheral angiography was performed followed by selective renal artery angiography revealing a 90% stenosis in the right renal artery. The right renal artery was successfully treated with a 6.0 x 18 mm Herculink stent.  Following percutaneous intervention, blood pressure significantly. Renal function has been stable. She will be discharged home today in good condition with followup renal artery duplex arranged as outpatient.  Discharge Vitals Blood pressure 155/64, pulse 89, temperature 98.4 F (36.9 C), temperature source Oral, resp. rate 18, height 5\' 1"  (1.549 m), weight 113 lb 5.1 oz (51.4 kg), SpO2 99.00%.  Filed Weights   03/17/14 1033 03/18/14 0610  Weight: 122 lb (55.339 kg) 113 lb 5.1 oz (51.4 kg)    Labs  CBC  Recent Labs  03/17/14 1132 03/18/14 0745  WBC  --  5.6  HGB  --  11.9*  HCT  --  36.6  MCV  --  86.5  PLT 155 387   Basic Metabolic Panel  Recent Labs  03/18/14 0745  NA 140  K 3.6*  CL 104  CO2 22  GLUCOSE 108*  BUN 14  CREATININE 0.76  CALCIUM 9.9   Disposition  Pt is being discharged home today in good condition.  Follow-up Plans & Appointments  Follow-up Information   Follow up with Kathlyn Sacramento, MD On 04/13/2014. (9:15 AM)    Specialty:  Cardiology   Contact information:   [address] [place] [address] [phone]       Follow up with [facility] On 04/02/2014. (Renal Artery Duplex - 8:00 AM)    Contact information:   [address] [place] [address] [phone]      Follow up with VYAS,DHRUV B., MD. (as scheduled.)    Specialty:  Internal Medicine   Contact information:   [place] [address] [phone]       Discharge Medications    Medication List          acetaminophen 325 MG tablet  Commonly known as:  TYLENOL  Take 325 mg by mouth every 6 (six) hours as needed. For pain.     amLODipine 2.5 MG tablet  Commonly known as:  NORVASC  Take 1 tablet (2.5 mg total) by mouth daily.     aspirin EC 81 MG tablet  Take 81 mg by mouth at bedtime.     atorvastatin 20 MG tablet  Commonly known as:  LIPITOR  Take 20 mg by mouth at bedtime.     calcium carbonate 600 MG Tabs tablet  Commonly known as:  OS-CAL  Take 600 mg by mouth 2 (two) times daily with a meal.     clonazePAM 0.5 MG tablet  Commonly known as:  KLONOPIN  Take 0.125-0.25 mg by mouth at bedtime as needed for anxiety.     clopidogrel 75 MG tablet  Commonly known as:  PLAVIX  Take 75 mg by mouth daily with breakfast.  docusate sodium 100 MG capsule  Commonly known as:  COLACE  Take 100 mg by mouth 2 (two) times daily.     labetalol 200 MG tablet  Commonly known as:  NORMODYNE  Take 300 mg by mouth 2 (two) times daily.     polyethylene glycol packet  Commonly known as:  MIRALAX / GLYCOLAX  Take 17 g by mouth every morning. Constipation     PRESERVISION/LUTEIN Caps  Take 1 capsule by mouth 2 (two) times daily.       Outstanding Labs/Studies  Renal Arterial Duplex as scheduled.  Duration of Discharge Encounter   Greater than 30 minutes including physician time.  Signed, Rogelia Mire NP 03/18/2014, 9:30 AM

## 2014-03-18 NOTE — Progress Notes (Signed)
   Patient Name: [name] Date of Encounter: 03/18/2014   Principal Problem:   RAS (renal artery stenosis) Active Problems:   Uncontrolled hypertension   DYSLIPIDEMIA   Coronary atherosclerosis of native coronary artery   Penetrating atherosclerotic ulcer of aorta    SUBJECTIVE  No chest pain or sob.  S/P R RA stenting yesterday.  L groin sore but w/o bleeding or hematoma.  BP improved overnight.  CURRENT MEDS . aspirin EC  81 mg Oral QHS  . atorvastatin  20 mg Oral QHS  . calcium carbonate  1 tablet Oral BID WC  . clopidogrel  75 mg Oral Q breakfast  . docusate sodium  100 mg Oral BID  . labetalol  300 mg Oral BID  . polyethylene glycol  17 g Oral q morning - 10a    OBJECTIVE  Filed Vitals:   03/17/14 1823 03/17/14 1900 03/17/14 2053 03/17/14 2351  BP: 169/79 162/68 145/64 133/57  Pulse: 82  88 82  Temp: 98.2 F (36.8 C)  97.9 F (36.6 C) 98.5 F (36.9 C)  TempSrc: Oral  Oral Oral  Resp: 18  16 18   Height:      Weight:      SpO2: 100%  100% 96%    Intake/Output Summary (Last 24 hours) at 03/18/14 0701 Last data filed at 03/18/14 0100  Gross per 24 hour  Intake 898.75 ml  Output      0 ml  Net 898.75 ml   Filed Weights   03/17/14 1033  Weight: 122 lb (55.339 kg)    PHYSICAL EXAM  General: Pleasant, NAD. Neuro: Alert and oriented X 3. Moves all extremities spontaneously. Psych: Normal affect. HEENT:  Normal  Neck: Supple without bruits or JVD. Lungs:  Resp regular and unlabored, CTA. Heart: RRR no s3, s4, or murmurs. Abdomen: Soft, non-tender, non-distended, BS + x 4.  Extremities: No clubbing, cyanosis or edema. DP/PT/Radials 1+ and equal bilaterally.  L groin cath site w/o bleeding/bruit/hematoma.    Accessory Clinical Findings  CBC  Recent Labs  03/17/14 1132  PLT 546   Basic Metabolic Panel No results found for this basename: NA, K, CL, CO2, GLUCOSE, BUN, CREATININE, CALCIUM, MG, PHOS,  in the last 72 hours   TELE  Rsr, rare  pvc's.  ASSESSMENT AND PLAN  1.  Right Renal Artery Stenosis:  S/p PTA/stenting yesterday.  Groin sore today but w/o evidence of hematoma, bleeding, or bruit.  BP stable.  Ambulate this AM with plan for d/c once labs have returned.  Cont asa/plavix x at least 1 month.  F/U renal duplex prior to office f/u in ~ 2 wks.  2.  Uncontrolled HTN:  In setting of #1.  Stable overnight.  Cont current meds.  3.  Nonobstructive CAD:  Cath yesterday showed non-obstructive dzs.  Cont med rx - asa, statin.  4.  HL:  On statin.  Followed by PCP in [place].  5.  H/o EAT:  In sinus.  Cont bb.  6.  H/O Ao ulceration: s/p stent grafting.  Signed, Rogelia Mire NP    Patient seen and examined. Agree with assessment and plan. Feels well. Had vague left sided chest ache. L groin site stable. Lab pending from this am. Prob dc in am later if ok.   Troy Sine, MD, [facility] 03/18/2014 7:20 AM

## 2014-03-22 ENCOUNTER — Telehealth: Payer: Self-pay | Admitting: Cardiovascular Disease

## 2014-03-22 NOTE — Telephone Encounter (Signed)
Called patient and she reports that she had pain in the right side of her head and tingling in the right hand at 1 pm. At 130pm  her BP was 215/107 with a HR of 88. When she rechecked it recently was 153/76. She had a renal artery stent placed recently.   Currently the headache is not as bad but still has some pain. She is taking Labetalol 300mg  BID and Norvasc 2.5mg  qd.  Has prn Clonidine 0.1 mg to take. Advised her to take a Clonidine 0.1 mg now and to call us back tomorrow with a BP reading from tonight and tomorrow morning. She will call back sooner if symptoms do not improve. She verbalized understanding.  Will let Dr.Arida know that she called.

## 2014-03-22 NOTE — Telephone Encounter (Signed)
New Message:  Pt is c/o a headache and tingling in her hands... Pt would like a call back from then nurse

## 2014-03-23 NOTE — Telephone Encounter (Signed)
Follow up    Blood pressure and heart rate readings for the nurse 03-22-14--7:45 pm 153/76 hr 67; 10:50pm  141/72 hr 73; 03-23-14 8am 141/73 hr 61---129/76 at 8:10am----10:20am 108/67  Pt was really "lifeless"-----135/66 at 10:30 with a 72 HR----125/67 at 10:35 with a 70hr.

## 2014-03-24 NOTE — Telephone Encounter (Signed)
I spoke with the pt and she has checked her BP multiple times this morning. Her SBP is averaging 115.  The pt decided to only take 200mg  of Labetalol this morning and she will take her norvasc as scheduled. I made the pt aware that she needs to decrease the number of times she is checking her BP to 1-2 times per day and also when symptomatic. The pt will call our office if she feels like her BP is running to low.  At this time the pt would like to try Labetalol 200mg  twice a day and I advised her that if her BP was higher then she should go back to 300mg  twice a day.  The pt's pulse is running in the 60s and 70s. The pt plans to continue monitoring her BP and will contact our office with any other questions or concerns.

## 2014-03-24 NOTE — Telephone Encounter (Signed)
Most of the readings look good. Do not check BP more than once daily. She should relax. BP is going to improve after the recent stent.  Take Clonidine 0.1 mg prn for SBP >160

## 2014-03-24 NOTE — Telephone Encounter (Signed)
F/u   Pt call concerning previous message and stated her bp today was 117/59.

## 2014-03-25 ENCOUNTER — Encounter: Payer: Self-pay | Admitting: Cardiovascular Disease

## 2014-03-25 NOTE — Telephone Encounter (Signed)
New message          Pt would like to know if she can have renal arteries ultrasound done at [facility]?

## 2014-03-26 NOTE — Telephone Encounter (Signed)
This encounter was created in error - please disregard.

## 2014-03-30 ENCOUNTER — Telehealth: Payer: Self-pay | Admitting: Cardiovascular Disease

## 2014-03-30 NOTE — Telephone Encounter (Signed)
I spoke with the pt and made her aware that the pt needs to continue taking ASA and Plavix together.

## 2014-03-30 NOTE — Telephone Encounter (Signed)
New message     should patient continue ASA 81 mg along with plavix  75 mg

## 2014-04-02 ENCOUNTER — Encounter (HOSPITAL_COMMUNITY): Payer: Medicare Other

## 2014-04-07 ENCOUNTER — Encounter (HOSPITAL_COMMUNITY): Payer: Medicare Other

## 2014-04-08 ENCOUNTER — Ambulatory Visit (HOSPITAL_COMMUNITY): Payer: Medicare Other | Attending: Internal Medicine | Admitting: Cardiology

## 2014-04-08 DIAGNOSIS — I701 Atherosclerosis of renal artery: Secondary | ICD-10-CM

## 2014-04-08 DIAGNOSIS — I700 Atherosclerosis of aorta: Secondary | ICD-10-CM

## 2014-04-08 NOTE — Progress Notes (Signed)
Renal artery duplex complete

## 2014-04-13 ENCOUNTER — Ambulatory Visit (INDEPENDENT_AMBULATORY_CARE_PROVIDER_SITE_OTHER): Payer: Medicare Other | Admitting: Cardiovascular Disease

## 2014-04-13 ENCOUNTER — Encounter: Payer: Self-pay | Admitting: Cardiovascular Disease

## 2014-04-13 VITALS — BP 200/110 | HR 98 | Ht 61.0 in | Wt 120.0 lb

## 2014-04-13 DIAGNOSIS — I701 Atherosclerosis of renal artery: Secondary | ICD-10-CM

## 2014-04-13 DIAGNOSIS — I739 Peripheral vascular disease, unspecified: Secondary | ICD-10-CM

## 2014-04-13 DIAGNOSIS — I2510 Atherosclerotic heart disease of native coronary artery without angina pectoris: Secondary | ICD-10-CM

## 2014-04-13 NOTE — Progress Notes (Signed)
HPI  This is a 78 year old female who is here today for a followup visit. She has known history of severe right renal artery stenosis status post stent placement in April of 2013. Her blood pressure improved significantly after that. She required only labetalol after stenting. However, she had recurrent severe hypertension with evidence of instent restenosis recently. The patient has known occluded right iliac graft with no significant worsening of claudication. I proceeded with renal angiography last month which confirmed severe ostial in-stent restenosis in the right renal artery. I performed angioplasty and bare-metal stent placement. This extended a few millimeters into the aorta. Home blood pressure readings were reviewed. These show significant improvement in blood pressure control. Most of the time of blood pressure is around 263-785 systolic. She did have a few readings above 200 and the setting of stress and anxiety. She had an emergency room visit for nosebleed.   Allergies  Allergen Reactions  . Cefixime Shortness Of Breath    Patient is not familiar with this listed allergy  . Conjugated Estrogens Other (See Comments)    REACTION: flushing  . Morphine And Related Other (See Comments)    Morphine injection Neck pain  . Codeine Other (See Comments)    REACTION: dizziness  . Iohexol      Desc: pt has anxiety and an irregular heartbeat. contrast exacerbates this. pt was very uncomfortable after a 20cc bolus for an miroi. we did w/o iv 11/09 per dr. Kris Hartmann. pt will talk w/ md about this and will probably not get iv contrast in the future at her re, Onset Date: [id number]   . Adenosine Other (See Comments)    Elevated heart rate when doing stress test     Current Outpatient Prescriptions on File Prior to Visit  Medication Sig Dispense Refill  . acetaminophen (TYLENOL) 325 MG tablet Take 325 mg by mouth every 6 (six) hours as needed. For pain.      Marland Kitchen amLODipine (NORVASC) 2.5  MG tablet Take 1 tablet (2.5 mg total) by mouth daily.  180 tablet  3  . aspirin EC 81 MG tablet Take 81 mg by mouth at bedtime.       Marland Kitchen atorvastatin (LIPITOR) 20 MG tablet Take 20 mg by mouth at bedtime.       . calcium carbonate (OS-CAL) 600 MG TABS tablet Take 600 mg by mouth 2 (two) times daily with a meal.      . clonazePAM (KLONOPIN) 0.5 MG tablet Take 0.125-0.25 mg by mouth at bedtime as needed for anxiety.       . clopidogrel (PLAVIX) 75 MG tablet Take 1 tablet (75 mg total) by mouth daily with breakfast.  30 tablet  3  . docusate sodium (COLACE) 100 MG capsule Take 100 mg by mouth 2 (two) times daily.        Marland Kitchen labetalol (NORMODYNE) 200 MG tablet Take 300 mg by mouth once.       . Multiple Vitamins-Minerals (PRESERVISION/LUTEIN) CAPS Take 1 capsule by mouth 2 (two) times daily.       . polyethylene glycol (MIRALAX / GLYCOLAX) packet Take 17 g by mouth every morning. Constipation       No current facility-administered medications on file prior to visit.     Past Medical History  Diagnosis Date  . Essential hypertension, benign   . Non-obstructive CAD     a. Reportedly nonobstructive at catheterization in the 1990s;  b. 03/2014 Cath: LM nl, LAD 30-40p,  39m, D1 20ost, D2 50ost/p, D3 small, LCX nl/nondominant, OM 1/2/3 nl, RCA 20-30p, 36m, PDA nl, RPL1/2  small, nl.  . Macular degeneration   . Anxiety disorder   . Diverticulosis     Left sided noted on colonoscopy 02/2006  . Chronic constipation   . Esophageal reflux disease   . Lumbar radiculopathy   . Hyperlipidemia   . Hiatal hernia   . Arthritis   . Penetrating atherosclerotic ulcer of aorta     Complex ulcerated plaque of the infrarenal abdominal aorta mild renal artery stenosis on the right side, normal left renal artery catheterization 2009, status post aortic stent graft  . Claudication     Chronic occlusion of the right limb of aortic stent graft.  . Trigeminal neuralgia     Right  . Type 2 diabetes mellitus   . Anemia    . Migraines   . Renal artery stenosis     a. Right renal artery stent April 2013 - Dr. Fletcher Anon;  b. 03/2014 PTA/stenting of RRA 2/2 ISR: 6x18 Herculink stent.  . Ectopic atrial tachycardia      Past Surgical History  Procedure Laterality Date  . Carotid endarterectomy Left 1993  . Endologix powerlink bifurcated      Covered stent graft  . Tonsillectomy and adenoidectomy  1947  . Abdominal hysterectomy  1982  . Lumbar disc surgery  1981  . Breast biopsy Left 1990's     Family History  Problem Relation Age of Onset  . Heart failure Mother   . Coronary artery disease Mother   . Heart disease Mother   . Heart attack Father   . Heart disease Father   . Heart disease Brother      History   Social History  . Marital Status: Married    Spouse Name: N/A    Number of Children: N/A  . Years of Education: N/A   Occupational History  . Retired     Scientist, clinical (histocompatibility and immunogenetics) at [employer] History Main Topics  . Smoking status: Former Smoker -- 0.20 packs/day for 1 years    Types: Cigarettes    Quit date: 11/13/1979  . Smokeless tobacco: Never Used  . Alcohol Use: No  . Drug Use: No  . Sexual Activity: No   Other Topics Concern  . Not on file   Social History Narrative  . No narrative on file     PHYSICAL EXAM   BP 200/110  Pulse 98  Ht 5\' 1"  (1.549 m)  Wt 120 lb (54.432 kg)  BMI 22.69 kg/m2 Constitutional: She is oriented to person, place, and time. She appears well-developed and well-nourished. No distress.  HENT: No nasal discharge.  Head: Normocephalic and atraumatic.  Eyes: Pupils are equal and round. Right eye exhibits no discharge. Left eye exhibits no discharge.  Neck: Normal range of motion. Neck supple. No JVD present. No thyromegaly present.  Cardiovascular: Normal rate, regular rhythm, normal heart sounds. Exam reveals no gallop and no friction rub. No murmur heard.  Pulmonary/Chest: Effort normal and breath sounds normal. No stridor. No respiratory distress.  She has no wheezes. She has no rales. She exhibits no tenderness.  Abdominal: Soft. Bowel sounds are normal. She exhibits no distension. There is no tenderness. There is no rebound and no guarding.  Musculoskeletal: Normal range of motion. She exhibits no edema and no tenderness.  Neurological: She is alert and oriented to person, place, and time. Coordination normal.  Skin: Skin is warm and dry.  No rash noted. She is not diaphoretic. No erythema. No pallor.  Psychiatric: She has a normal mood and affect. Her behavior is normal. Judgment and thought content normal.  Left groin: No hematoma or bruit.   ASSESSMENT AND PLAN

## 2014-04-13 NOTE — Assessment & Plan Note (Signed)
Recent renal artery duplex showed no evidence of in-stent restenosis. She can stop Plavix after one month of treatment. Blood pressure is elevated today but she was worried that the stent might have closed. Usually blood pressure goes up when she is anxious. I am hesitant to increase amlodipine given the low blood pressure readings in the morning. She can continue to use clonidine 0.1 mg as needed if systolic blood pressure is greater than 160. Recommend a followup renal artery duplex in 6 months from now with followup.

## 2014-04-13 NOTE — Patient Instructions (Signed)
Your physician has requested that you have a renal artery duplex. DX: Stenosis During this test, an ultrasound is used to evaluate blood flow to the kidneys. Allow one hour for this exam. Do not eat after midnight the day before and avoid carbonated beverages. Take your medications as you usually do.  Your physician recommends that you continue on your current medications as directed. Please refer to the Current Medication list given to you today. YOU CAN STOP PLAVIX ON June 5TH.  Your physician wants you to follow-up in: [place] DR. Fletcher Anon You will receive a reminder letter in the mail two months in advance. If you don't receive a letter, please call our office to schedule the follow-up appointment @ [phone]

## 2014-04-15 ENCOUNTER — Telehealth: Payer: Self-pay | Admitting: *Deleted

## 2014-04-15 NOTE — Telephone Encounter (Signed)
Patient called to request a sooner appointment for elevated BP. Appointment given for 04/30/14.

## 2014-04-29 ENCOUNTER — Encounter: Payer: Self-pay | Admitting: Cardiology

## 2014-04-29 NOTE — Progress Notes (Signed)
Clinical Summary [name] is a medically complex 78 y.o.female seen by me for the first time in the office in April of this year, a former patient of Dr. Dannielle Burn. At that time medications were adjusted for better blood pressure control she was referred for further workup of PAD and renal artery stenosis per Dr. Fletcher Anon. She underwent followup angiographic studies ultimately.demonstrating mild to moderate nonobstructive CAD with heavily calcified vessels, and severe ostial right renal artery stenosis that was treated with angioplasty and BMS. She has had followup with Dr. Fletcher Anon just recently - blood pressures were reviewed, significantly elevated when she was seen.  She comes in today stating that she has been doing fairly well. I reviewed her home blood pressure measurements, in the last few weeks they have looked much better. Today's blood pressure was repeated and systolic was in the 024O. She reports compliance with her medications. I do note that she was seen in the [facility] ER back in late May with a nosebleed when on Plavix. This has since resolved, and she has completed the course of Plavix, continues on aspirin.   Allergies  Allergen Reactions  . Cefixime Shortness Of Breath    Patient is not familiar with this listed allergy  . Conjugated Estrogens Other (See Comments)    REACTION: flushing  . Morphine And Related Other (See Comments)    Morphine injection Neck pain  . Codeine Other (See Comments)    REACTION: dizziness  . Iohexol      Desc: pt has anxiety and an irregular heartbeat. contrast exacerbates this. pt was very uncomfortable after a 20cc bolus for an miroi. we did w/o iv 11/09 per dr. Kris Hartmann. pt will talk w/ md about this and will probably not get iv contrast in the future at her re, Onset Date: [id number]   . Adenosine Other (See Comments)    Elevated heart rate when doing stress test    Current Outpatient Prescriptions  Medication Sig Dispense Refill  . acetaminophen  (TYLENOL) 325 MG tablet Take 325 mg by mouth every 6 (six) hours as needed. For pain.      Marland Kitchen amLODipine (NORVASC) 2.5 MG tablet Take 1 tablet (2.5 mg total) by mouth daily.  180 tablet  3  . aspirin EC 81 MG tablet Take 81 mg by mouth at bedtime.       Marland Kitchen atorvastatin (LIPITOR) 20 MG tablet Take 20 mg by mouth at bedtime.       . calcium carbonate (OS-CAL) 600 MG TABS tablet Take 600 mg by mouth 2 (two) times daily with a meal.      . clonazePAM (KLONOPIN) 0.5 MG tablet Take 0.125 mg by mouth as needed for anxiety.       . docusate sodium (COLACE) 100 MG capsule Take 100 mg by mouth 2 (two) times daily.        Marland Kitchen labetalol (NORMODYNE) 200 MG tablet Take 200 mg by mouth 2 (two) times daily.       . Multiple Vitamins-Minerals (PRESERVISION/LUTEIN) CAPS Take 1 capsule by mouth 2 (two) times daily.       . polyethylene glycol (MIRALAX / GLYCOLAX) packet Take 17 g by mouth every morning. Constipation       No current facility-administered medications for this visit.    Past Medical History  Diagnosis Date  . Essential hypertension, benign   . Non-obstructive CAD     a. Reportedly nonobstructive at catheterization in the 1990s;  b. 03/2014 Cath: LM  nl, LAD 30-40p, 40m, D1 20ost, D2 50ost/p, D3 small, LCX nl/nondominant, OM 1/2/3 nl, RCA 20-30p, 86m, PDA nl, RPL1/2  small, nl.  . Macular degeneration   . Anxiety disorder   . Diverticulosis     Left sided noted on colonoscopy 02/2006  . Chronic constipation   . Esophageal reflux disease   . Lumbar radiculopathy   . Hyperlipidemia   . Hiatal hernia   . Arthritis   . Penetrating atherosclerotic ulcer of aorta     Complex ulcerated plaque of the infrarenal abdominal aorta mild renal artery stenosis on the right side, normal left renal artery catheterization 2009, status post aortic stent graft  . Claudication     Chronic occlusion of the right limb of aortic stent graft.  . Trigeminal neuralgia     Right  . Type 2 diabetes mellitus   . Anemia     . Migraines   . Renal artery stenosis     a. Right renal artery stent April 2013 - Dr. Fletcher Anon;  b. 03/2014 PTA/stenting of RRA 2/2 ISR: 6x18 Herculink stent.  . Ectopic atrial tachycardia     Past Surgical History  Procedure Laterality Date  . Carotid endarterectomy Left 1993  . Endologix powerlink bifurcated      Covered stent graft  . Tonsillectomy and adenoidectomy  1947  . Abdominal hysterectomy  1982  . Lumbar disc surgery  1981  . Breast biopsy Left 1990's    Social History Ms. Mancera reports that she quit smoking about 34 years ago. Her smoking use included Cigarettes. She has a .2 pack-year smoking history. She has never used smokeless tobacco. Ms. Mabey reports that she does not drink alcohol.  Review of Systems Sense of palpitations at times, no dizziness or syncope. No angina symptoms. Stable appetite. Other systems reviewed and negative.  Physical Examination Filed Vitals:   04/30/14 0932  BP: 170/90  Pulse: 80   Filed Weights   04/30/14 0932  Weight: 122 lb (55.339 kg)    Patient in no distress.  HEENT: Conjunctiva and lids normal, oropharynx clear.  Neck: Supple, no elevated JVP, right carotid bruit, no thyromegaly.  Lungs: Clear to auscultation, nonlabored breathing at rest.  Cardiac: Regular rate and rhythm, no S3 or significant systolic murmur, no pericardial rub.  Abdomen: Soft, nontender, bowel sounds present.  Extremities: No pitting edema, distal pulses 2+.  Skin: Warm and dry.  Musculoskeletal: No kyphosis.  Neuropsychiatric: Alert and oriented x3, affect grossly appropriate.   Problem List and Plan   Essential hypertension, benign Continue current medical regimen for now. She will keep checking blood pressure at home, if trend increases we might increase Norvasc next.  Coronary atherosclerosis of native coronary artery Recent cardiac catheterization showed heavily calcified vessels but overall mild to moderate nonobstructive CAD which was  managed medically. No active angina symptoms.  RAS (renal artery stenosis) Followed by Dr. Fletcher Anon status post angioplasty with BMS due to severe ostial right renal artery stenosis documented recently. She is now off Plavix, continues on aspirin.  Carotid artery disease Status post left carotid endarterectomy in 2009. Carotid Dopplers are followed by Dr. Woody Seller.  HLD (hyperlipidemia) Continues on Lipitor    [name], M.D., F.A.C.C.

## 2014-04-30 ENCOUNTER — Encounter: Payer: Self-pay | Admitting: Cardiology

## 2014-04-30 ENCOUNTER — Ambulatory Visit (INDEPENDENT_AMBULATORY_CARE_PROVIDER_SITE_OTHER): Payer: Medicare Other | Admitting: Cardiology

## 2014-04-30 VITALS — BP 170/90 | HR 80 | Ht 61.0 in | Wt 122.0 lb

## 2014-04-30 DIAGNOSIS — I739 Peripheral vascular disease, unspecified: Secondary | ICD-10-CM

## 2014-04-30 DIAGNOSIS — I10 Essential (primary) hypertension: Secondary | ICD-10-CM

## 2014-04-30 DIAGNOSIS — E785 Hyperlipidemia, unspecified: Secondary | ICD-10-CM

## 2014-04-30 DIAGNOSIS — I701 Atherosclerosis of renal artery: Secondary | ICD-10-CM

## 2014-04-30 DIAGNOSIS — I2510 Atherosclerotic heart disease of native coronary artery without angina pectoris: Secondary | ICD-10-CM

## 2014-04-30 DIAGNOSIS — I779 Disorder of arteries and arterioles, unspecified: Secondary | ICD-10-CM

## 2014-04-30 NOTE — Assessment & Plan Note (Signed)
Status post left carotid endarterectomy in 2009. Carotid Dopplers are followed by Dr. Woody Seller.

## 2014-04-30 NOTE — Assessment & Plan Note (Signed)
Continues on Lipitor. 

## 2014-04-30 NOTE — Assessment & Plan Note (Signed)
Recent cardiac catheterization showed heavily calcified vessels but overall mild to moderate nonobstructive CAD which was managed medically. No active angina symptoms.

## 2014-04-30 NOTE — Assessment & Plan Note (Signed)
Continue current medical regimen for now. She will keep checking blood pressure at home, if trend increases we might increase Norvasc next.

## 2014-04-30 NOTE — Patient Instructions (Signed)
Continue all current medications. Your physician wants you to follow up in:  4 months.  You will receive a reminder letter in the mail one-two months in advance.  If you don't receive a letter, please call our office to schedule the follow up appointment   

## 2014-04-30 NOTE — Assessment & Plan Note (Signed)
Followed by Dr. Fletcher Anon status post angioplasty with BMS due to severe ostial right renal artery stenosis documented recently. She is now off Plavix, continues on aspirin.

## 2014-05-27 ENCOUNTER — Ambulatory Visit: Payer: Medicare Other | Admitting: Cardiology

## 2014-09-08 ENCOUNTER — Telehealth: Payer: Self-pay | Admitting: Cardiovascular Disease

## 2014-09-08 NOTE — Telephone Encounter (Signed)
Pt would like to know if she had a tetanus injection when she had her stent placement on May 2015. Pt is aware that according to our immunizations records she has not had a tetanus immunization. Pt is aware to call her PCP to get one if needed. Pt verbalized understanding.

## 2014-09-08 NOTE — Telephone Encounter (Signed)
New message      Did pt have a tetanus shot when she got a stent in May 2015?  They have a new baby in the family and need to have a tetanus before they can see the baby.  If possible, please call today.

## 2014-10-13 ENCOUNTER — Encounter (HOSPITAL_COMMUNITY): Payer: Medicare Other

## 2014-10-21 ENCOUNTER — Encounter (HOSPITAL_COMMUNITY): Payer: Self-pay | Admitting: Cardiovascular Disease

## 2014-10-27 ENCOUNTER — Encounter (INDEPENDENT_AMBULATORY_CARE_PROVIDER_SITE_OTHER): Payer: Medicare Other

## 2014-10-27 DIAGNOSIS — I739 Peripheral vascular disease, unspecified: Secondary | ICD-10-CM

## 2014-10-27 DIAGNOSIS — I701 Atherosclerosis of renal artery: Secondary | ICD-10-CM

## 2014-12-06 ENCOUNTER — Other Ambulatory Visit: Payer: Self-pay | Admitting: *Deleted

## 2014-12-06 MED ORDER — AMLODIPINE BESYLATE 2.5 MG PO TABS: 2.5000 mg | ORAL_TABLET | Freq: Every day | ORAL | Status: DC

## 2015-02-28 ENCOUNTER — Encounter: Payer: Self-pay | Admitting: Cardiology

## 2015-02-28 ENCOUNTER — Ambulatory Visit (INDEPENDENT_AMBULATORY_CARE_PROVIDER_SITE_OTHER): Payer: Medicare Other | Admitting: Cardiology

## 2015-02-28 VITALS — BP 168/90 | HR 85 | Ht 62.0 in | Wt 120.0 lb

## 2015-02-28 DIAGNOSIS — R011 Cardiac murmur, unspecified: Secondary | ICD-10-CM | POA: Diagnosis not present

## 2015-02-28 DIAGNOSIS — I10 Essential (primary) hypertension: Secondary | ICD-10-CM | POA: Diagnosis not present

## 2015-02-28 DIAGNOSIS — I2510 Atherosclerotic heart disease of native coronary artery without angina pectoris: Secondary | ICD-10-CM

## 2015-02-28 DIAGNOSIS — I701 Atherosclerosis of renal artery: Secondary | ICD-10-CM

## 2015-02-28 DIAGNOSIS — M2550 Pain in unspecified joint: Secondary | ICD-10-CM

## 2015-02-28 NOTE — Patient Instructions (Signed)
Your physician has requested that you have an echocardiogram. Echocardiography is a painless test that uses sound waves to create images of your heart. It provides your doctor with information about the size and shape of your heart and how well your heart's chambers and valves are working. This procedure takes approximately one hour. There are no restrictions for this procedure. Office will contact with results via phone or letter.   Hold Lipitor x next couple of weeks & call office to give update on how feeling. Continue all other medications.   Follow up in  1 month

## 2015-02-28 NOTE — Progress Notes (Signed)
Cardiology Office Note  Date: 02/28/2015   ID: [name], DOB 1936/02/07, MRN [id number]  PCP: Glenda Chroman., MD  Primary Cardiologist: Rozann Lesches, MD   Chief Complaint  Patient presents with  . Arthralgias  . Coronary Artery Disease  . PAD    History of Present Illness: [name] is a medically complex 79 y.o. female last seen in June 2015. She has a history of heavily calcified coronary arteries, but overall mild to moderate nonobstructive stenosis, also PAD with renal artery stenosis status post BMS to the right renal artery by Dr. Fletcher Anon last year.   Records received from [facility] sent by Dr. Layne Benton who saw the patient recently complaining of fatigue and arthralgias. There was concern at that time for a possible systemic process or possibly side effects related to her statin therapy. She was sent for lab work, outlined below, and this office visit was also scheduled.  She tells me that her symptoms began several weeks ago, feeling of "chills" and also fatigue, achiness in her legs, at one point erythema and swelling of her left leg, and within the last several weeks a feeling of diffuse arthralgias and achiness. No rashes, no nail bed changes, no increasing shortness of breath, although exertional fatigue in the mornings. She was initially seen by Dr. Woody Seller, prescribed prednisone and Advil, although states that she did not take either. She has been subsequently evaluated by Dr. Layne Benton, and has been taking Advil within the last few weeks, reporting that her symptoms are gradually improving somewhat although not resolved.  He has been on Lipitor for several years, previous he took Zocor although it was not as effective. She has not tried any. Time off Lipitor to see if any of her symptoms are related.  I did review her extensive lab work with the exception of blood cultures which I could not locate as yet.   Past Medical History  Diagnosis  Date  . Essential hypertension, benign   . Non-obstructive CAD     a. Reportedly nonobstructive at catheterization in the 1990s;  b. 03/2014 Cath: LM nl, LAD 30-40p, 60m D1 20ost, D2 50ost/p, D3 small, LCX nl/nondominant, OM 1/2/3 nl, RCA 20-30p, 445mPDA nl, RPL1/2  small, nl.  . Macular degeneration   . Anxiety disorder   . Diverticulosis     Left sided noted on colonoscopy 02/2006  . Chronic constipation   . Esophageal reflux disease   . Lumbar radiculopathy   . Hyperlipidemia   . Hiatal hernia   . Arthritis   . Penetrating atherosclerotic ulcer of aorta     Complex ulcerated plaque of the infrarenal abdominal aorta mild renal artery stenosis on the right side, normal left renal artery catheterization 2009, status post aortic stent graft  . Claudication     Chronic occlusion of the right limb of aortic stent graft.  . Trigeminal neuralgia     Right  . Type 2 diabetes mellitus   . Anemia   . Migraines   . Renal artery stenosis     a. Right renal artery stent April 2013 - Dr. ArFletcher Anon b. 03/2014 PTA/stenting of RRA 2/2 ISR: 6x18 Herculink stent.  . Ectopic atrial tachycardia     Past Surgical History  Procedure Laterality Date  . Carotid endarterectomy Left 1993  . Endologix powerlink bifurcated      Covered stent graft  . Tonsillectomy and adenoidectomy  1947  . Abdominal hysterectomy  1982  .  Lumbar disc surgery  1981  . Breast biopsy Left 1990's  . Abdominal aortagram N/A 02/13/2012    Procedure: ABDOMINAL Maxcine Ham;  Surgeon: Wellington Hampshire, MD;  Location: [facility] CATH LAB;  Service: Cardiovascular;  Laterality: N/A;  . Renal angiogram N/A 02/13/2012    Procedure: RENAL ANGIOGRAM;  Surgeon: Wellington Hampshire, MD;  Location: [facility] CATH LAB;  Service: Cardiovascular;  Laterality: N/A;  . Abdominal aortagram N/A 03/17/2014    Procedure: ABDOMINAL Maxcine Ham;  Surgeon: Wellington Hampshire, MD;  Location: [facility] CATH LAB;  Service: Cardiovascular;  Laterality: N/A;  . Renal angiogram N/A 03/17/2014      Procedure: RENAL ANGIOGRAM;  Surgeon: Wellington Hampshire, MD;  Location: [facility] CATH LAB;  Service: Cardiovascular;  Laterality: N/A;  . Left heart catheterization with coronary angiogram N/A 03/17/2014    Procedure: LEFT HEART CATHETERIZATION WITH CORONARY ANGIOGRAM;  Surgeon: Wellington Hampshire, MD;  Location: [facility] CATH LAB;  Service: Cardiovascular;  Laterality: N/A;  . Percutaneous stent intervention Right 03/17/2014    Procedure: PERCUTANEOUS STENT INTERVENTION;  Surgeon: Wellington Hampshire, MD;  Location: [facility] CATH LAB;  Service: Cardiovascular;  Laterality: Right;  Renal    Current Outpatient Prescriptions  Medication Sig Dispense Refill  . acetaminophen (TYLENOL) 325 MG tablet Take 325 mg by mouth every 6 (six) hours as needed. For pain.    Marland Kitchen amLODipine (NORVASC) 2.5 MG tablet Take 1 tablet (2.5 mg total) by mouth daily. 90 tablet 3  . aspirin EC 81 MG tablet Take 81 mg by mouth at bedtime.     Marland Kitchen atorvastatin (LIPITOR) 20 MG tablet Take 20 mg by mouth at bedtime. 02/28/2015 - holding till instructed to resume by MD      . calcium carbonate (OS-CAL) 600 MG TABS tablet Take 600 mg by mouth 2 (two) times daily with a meal.    . clonazePAM (KLONOPIN) 0.5 MG tablet Take 0.125 mg by mouth as needed for anxiety.     . docusate sodium (COLACE) 100 MG capsule Take 100 mg by mouth 2 (two) times daily.      Marland Kitchen labetalol (NORMODYNE) 200 MG tablet Take 200 mg by mouth 2 (two) times daily.     . Multiple Vitamins-Minerals (PRESERVISION/LUTEIN) CAPS Take 1 capsule by mouth 2 (two) times daily.     . polyethylene glycol (MIRALAX / GLYCOLAX) packet Take 17 g by mouth every morning. Constipation     No current facility-administered medications for this visit.    Allergies:  Cefixime; Conjugated estrogens; Morphine and related; Codeine; Iohexol; and Adenosine   Social History: The patient  reports that she quit smoking about 35 years ago. Her smoking use included Cigarettes. She has a .2 pack-year smoking history. She  has never used smokeless tobacco. She reports that she does not drink alcohol or use illicit drugs.   ROS:  Please see the history of present illness. Otherwise, complete review of systems is positive for none.  All other systems are reviewed and negative.   Physical Exam: VS:  BP 168/90 mmHg  Pulse 85  Ht 5' 2"  (1.575 m)  Wt 120 lb (54.432 kg)  BMI 21.94 kg/m2  SpO2 97%, BMI Body mass index is 21.94 kg/(m^2).  Wt Readings from Last 3 Encounters:  02/28/15 120 lb (54.432 kg)  04/30/14 122 lb (55.339 kg)  04/13/14 120 lb (54.432 kg)     Patient in no distress.  HEENT: Conjunctiva and lids normal, oropharynx clear.  Neck: Supple, no elevated JVP, right carotid bruit,  no thyromegaly.  Lungs: Clear to auscultation, nonlabored breathing at rest.  Cardiac: Regular rate and rhythm, no S3, very soft basal systolic murmur, no pericardial rub.  Abdomen: Soft, nontender, bowel sounds present.  Extremities: No pitting edema, distal pulses 2+.  Skin: Warm and dry. Some skin flaking and dryness of the left leg mainly Musculoskeletal: No kyphosis.  Neuropsychiatric: Alert and oriented x3, affect grossly appropriate.   ECG: ECG is ordered today showing sinus rhythm with vertical axis and borderline low voltage.   Recent Labwork:  02/21/2015: Lyme titers negative, high sensitivity CRP 0.87, CPK 44, BNP 94, ESR 11, protein electrophoresis not particularly concerning, ANA negative, rheumatoid factor negative, vitamin D 28, BUN 21, creatinine 0.9, normal LFTs, potassium 4.4, TSH 1.9, hemoglobin 11.6, platelets 275  Other Studies Reviewed Today:  Abdominal ultrasound 10/27/2014 showed normal caliber abdominal aorta, aortoiliac atherosclerosis without stenosis, stable kidney size, patent right proximal renal artery stent, normal left renal artery.  Assessment and Plan:  1. Arthralgias, achiness, fatigue over the last several weeks as outlined above. Patient seen by Dr. Layne Benton with  thoughtful evaluation, lab work reviewed above. No definite culprit found however. I would like to review her blood culture results, but doubt that endocarditis is a unifying diagnosis, particularly with normal sedimentation rate. The mildly elevated high sensitivity CRP is probably consistent with her known vascular disease. She does have a very soft cardiac murmur on exam, to be complete we will obtain an echocardiogram. Otherwise I have asked her to stop her Lipitor for a few weeks to see if she notices any dramatic improvement in symptoms. Possibility of drug side effect needs to be excluded, although long-term I would prefer that she stays on statin if possible. We will have her follow-up over the next 6 weeks.  2. History of nonobstructive CAD as outlined above with heavy coronary calcifications. No active angina symptoms.  3. Peripheral arterial disease including penetrating aortic ulcer status post stent graft repair, and also renal artery stenosis status post BMS to the right renal artery. She is followed by Dr. Fletcher Anon.  4. Essential hypertension, blood pressure elevated today. No changes made in medical regimen at this time.  Current medicines were reviewed with the patient today. She will plan to hold Lipitor for at least the next 2 weeks.   Orders Placed This Encounter  Procedures  . EKG 12-Lead  . 2D Echocardiogram without contrast    Disposition: FU with me in 6 weeks.   Signed, [name], MD, [facility] 02/28/2015 4:40 PM    [facility] [place], [place], [place] [address] Phone: [phone]; Fax: [phone]

## 2015-03-01 ENCOUNTER — Encounter: Payer: Self-pay | Admitting: *Deleted

## 2015-03-09 ENCOUNTER — Ambulatory Visit (INDEPENDENT_AMBULATORY_CARE_PROVIDER_SITE_OTHER): Payer: Medicare Other

## 2015-03-09 ENCOUNTER — Other Ambulatory Visit: Payer: Self-pay

## 2015-03-09 DIAGNOSIS — I2510 Atherosclerotic heart disease of native coronary artery without angina pectoris: Secondary | ICD-10-CM

## 2015-03-09 DIAGNOSIS — R011 Cardiac murmur, unspecified: Secondary | ICD-10-CM | POA: Diagnosis not present

## 2015-03-11 ENCOUNTER — Telehealth: Payer: Self-pay | Admitting: *Deleted

## 2015-03-11 NOTE — Telephone Encounter (Signed)
Patient informed. 

## 2015-03-11 NOTE — Telephone Encounter (Signed)
-----   Message from [name], MD sent at 03/10/2015  8:00 AM EDT ----- Reviewed report. Normal LVEF, importantly no description of valvular vegetations.

## 2015-04-12 ENCOUNTER — Encounter: Payer: Self-pay | Admitting: Cardiology

## 2015-04-12 ENCOUNTER — Ambulatory Visit (INDEPENDENT_AMBULATORY_CARE_PROVIDER_SITE_OTHER): Payer: Medicare Other | Admitting: Cardiology

## 2015-04-12 VITALS — BP 197/92 | HR 89 | Ht 62.0 in | Wt 124.0 lb

## 2015-04-12 DIAGNOSIS — I2510 Atherosclerotic heart disease of native coronary artery without angina pectoris: Secondary | ICD-10-CM | POA: Diagnosis not present

## 2015-04-12 DIAGNOSIS — I10 Essential (primary) hypertension: Secondary | ICD-10-CM | POA: Diagnosis not present

## 2015-04-12 DIAGNOSIS — M2550 Pain in unspecified joint: Secondary | ICD-10-CM

## 2015-04-12 DIAGNOSIS — I701 Atherosclerosis of renal artery: Secondary | ICD-10-CM

## 2015-04-12 NOTE — Patient Instructions (Signed)
Your physician recommends that you continue on your current medications as directed. Please refer to the Current Medication list given to you today. Please have your lipid lab work done at Dr. Woody Seller office. Your physician recommends that you schedule a follow-up appointment in: 6 months. You will receive a reminder letter in the mail in about 4 months reminding you to call and schedule your appointment. If you don't receive this letter, please contact our office.

## 2015-04-12 NOTE — Progress Notes (Signed)
Cardiology Office Note  Date: 04/12/2015   ID: [name], DOB [birth year], MRN [id number]  PCP: Glenda Chroman., MD  Primary Cardiologist: Rozann Lesches, MD   Chief Complaint  Patient presents with  . Cardiac follow-up    History of Present Illness: [name] is a medically complex 79 y.o. female last seen in April. She comes in today for a follow-up visit, states that she has been feeling better since I last saw her. We did take her off Lipitor, and I wonder whether she was having musculoskeletal side effects related to it. She is due to follow-up with Dr. Woody Seller for repeat lab work.   We discussed her follow-up echocardiogram which is reviewed below, overall stable findings.  Today's blood pressure was significantly elevated in the office, however I suspect that she has a component of whitecoat hypertension. We did review her home blood pressure checks today, typically within very good range on her current regimen. She has actually cut back the dose of her labetalol.   Past Medical History  Diagnosis Date  . Essential hypertension, benign   . Non-obstructive CAD     a. Reportedly nonobstructive at catheterization in the 1990s;  b. 03/2014 Cath: LM nl, LAD 30-40p, 56m, D1 20ost, D2 50ost/p, D3 small, LCX nl/nondominant, OM 1/2/3 nl, RCA 20-30p, 42m, PDA nl, RPL1/2  small, nl.  . Macular degeneration   . Anxiety disorder   . Diverticulosis     Left sided noted on colonoscopy 02/2006  . Chronic constipation   . Esophageal reflux disease   . Lumbar radiculopathy   . Hyperlipidemia   . Hiatal hernia   . Arthritis   . Penetrating atherosclerotic ulcer of aorta     Complex ulcerated plaque of the infrarenal abdominal aorta mild renal artery stenosis on the right side, normal left renal artery catheterization 2009, status post aortic stent graft  . Claudication     Chronic occlusion of the right limb of aortic stent graft.  . Trigeminal neuralgia     Right  . Type 2  diabetes mellitus   . Anemia   . Migraines   . Renal artery stenosis     a. Right renal artery stent April 2013 - Dr. Fletcher Anon;  b. 03/2014 PTA/stenting of RRA 2/2 ISR: 6x18 Herculink stent.  . Ectopic atrial tachycardia     Current Outpatient Prescriptions  Medication Sig Dispense Refill  . acetaminophen (TYLENOL) 325 MG tablet Take 325 mg by mouth every 6 (six) hours as needed. For pain.    Marland Kitchen amLODipine (NORVASC) 2.5 MG tablet Take 1 tablet (2.5 mg total) by mouth daily. (Patient taking differently: Take 2.5 mg by mouth every evening. 3:00 or 4:00 pm) 90 tablet 3  . aspirin EC 81 MG tablet Take 81 mg by mouth at bedtime.     . clonazePAM (KLONOPIN) 0.5 MG tablet Take 0.125 mg by mouth as needed for anxiety.     Marland Kitchen labetalol (NORMODYNE) 200 MG tablet Take 1/2 tab (100mg ) by mouth every morning & 1 tab (200mg ) every evening    . Multiple Vitamins-Minerals (PRESERVISION/LUTEIN) CAPS Take 1 capsule by mouth 2 (two) times daily.     . polyethylene glycol (MIRALAX / GLYCOLAX) packet Take 17 g by mouth every morning. Constipation     No current facility-administered medications for this visit.    Allergies:  Cefixime; Conjugated estrogens; Morphine and related; Codeine; Iohexol; and Adenosine   Social History: The patient  reports that she quit smoking  about 35 years ago. Her smoking use included Cigarettes. She has a .2 pack-year smoking history. She has never used smokeless tobacco. She reports that she does not drink alcohol or use illicit drugs.   ROS:  Please see the history of present illness. Otherwise, complete review of systems is positive for occasional palpitations, dizziness, anxiety.  All other systems are reviewed and negative.   Physical Exam: VS:  BP 197/92 mmHg  Pulse 89  Ht 5\' 2"  (1.575 m)  Wt 124 lb (56.246 kg)  BMI 22.67 kg/m2  SpO2 96%, BMI Body mass index is 22.67 kg/(m^2).  Wt Readings from Last 3 Encounters:  04/12/15 124 lb (56.246 kg)  02/28/15 120 lb (54.432 kg)    04/30/14 122 lb (55.339 kg)     Patient in no distress.  HEENT: Conjunctiva and lids normal, oropharynx clear.  Neck: Supple, no elevated JVP, right carotid bruit, no thyromegaly.  Lungs: Clear to auscultation, nonlabored breathing at rest.  Cardiac: Regular rate and rhythm, no S3, very soft basal systolic murmur, no pericardial rub.  Abdomen: Soft, nontender, bowel sounds present.  Extremities: No pitting edema, distal pulses 2+.   ECG: ECG is not ordered today.   Other Studies Reviewed Today:  Echocardiogram 03/09/2015: Study Conclusions  - Left ventricle: The cavity size was normal. Systolic function was normal. The estimated ejection fraction was in the range of 55% to 60%. Wall motion was normal; there were no regional wall motion abnormalities. Doppler parameters are consistent with abnormal left ventricular relaxation (grade 1 diastolic dysfunction). Doppler parameters are consistent with high ventricular filling pressure. Mild focal basal septal hypertrophy. - Aortic valve: Mildly calcified annulus. Trileaflet; mildly thickened leaflets. - Mitral valve: Mild to moderately thickened leaflets . There was trivial regurgitation. - Left atrium: The atrium was mildly dilated. - Tricuspid valve: There was mild-moderate regurgitation. - Pulmonic valve: There was mild regurgitation.   Assessment and Plan:  1. Prior description of fatigue, achiness, and arthralgias - improving. Question whether this was related to Lipitor, she will stay off of the medication. Follow-up lipid panel with Dr. Woody Seller. Depending on levels, can consider different medication if needed.  2. History of nonobstructive CAD, continue observation, no active angina symptoms.  3. Peripheral arterial disease including penetrating aortic ulcer status post stent graft repair, and also renal artery stenosis status post BMS to the right renal artery. She is followed by Dr.  Fletcher Anon.   Current medicines were reviewed with the patient today.  Disposition: FU with me in 6 months.   Signed, [name], MD, [facility] 04/12/2015 3:52 PM    [facility] [place], [place], [place] [address] Phone: [phone]; Fax: [phone]

## 2015-04-18 ENCOUNTER — Encounter: Payer: Self-pay | Admitting: *Deleted

## 2015-04-18 ENCOUNTER — Telehealth: Payer: Self-pay | Admitting: *Deleted

## 2015-04-18 MED ORDER — ROSUVASTATIN CALCIUM 5 MG PO TABS: 5.0000 mg | ORAL_TABLET | Freq: Every day | ORAL | Status: DC

## 2015-04-18 NOTE — Telephone Encounter (Signed)
Since she is on Norvasc already, it might be better to avoid starting Zocor. We could try Crestor beginning at 5 mg every other day.

## 2015-04-18 NOTE — Telephone Encounter (Signed)
Pt called to give lab results recently done at Dr. Woody Seller office. Requested results and will have scanned as soon as they are sent. Pt says total cholesterol 256 LDL 181 HDL 41 Triglycerides 169. Pt says that she had spoke with Dr. Domenic Polite about starting a statin and would like to know if simvastatin would be an option. Will forward to Dr. Domenic Polite

## 2015-04-18 NOTE — Telephone Encounter (Signed)
[facility], pt made aware.

## 2015-05-31 ENCOUNTER — Encounter: Payer: Self-pay | Admitting: Cardiovascular Disease

## 2015-05-31 ENCOUNTER — Ambulatory Visit (INDEPENDENT_AMBULATORY_CARE_PROVIDER_SITE_OTHER): Payer: Medicare Other | Admitting: Cardiovascular Disease

## 2015-05-31 VITALS — BP 180/100 | HR 77 | Ht 62.0 in | Wt 120.0 lb

## 2015-05-31 DIAGNOSIS — I10 Essential (primary) hypertension: Secondary | ICD-10-CM

## 2015-05-31 DIAGNOSIS — I701 Atherosclerosis of renal artery: Secondary | ICD-10-CM | POA: Diagnosis not present

## 2015-05-31 NOTE — Progress Notes (Signed)
HPI  This is a 79 year old female who is here today for a followup visit. She has known history of severe right renal artery stenosis status post stent placement in April of 2013. Her blood pressure improved significantly after that. She required only labetalol after stenting. However, she had recurrent severe hypertension with evidence of instent restenosis . Repeat angiography in May 2015 confirmed severe ostial in-stent restenosis in the right renal artery. I performed angioplasty and bare-metal stent placement. Renal artery duplex in December showed patent stent. She has white coat syndrome with significant elevation of blood pressure in the office. Home blood pressure readings are relatively controlled around 188 systolic. She complains of low blood pressure when she takes for those labetalol in the morning.  The patient has known history of an occluded right iliac graft after placement of a stent graft for a penetrating ulcer in 2009.      Allergies  Allergen Reactions  . Cefixime Shortness Of Breath    Patient is not familiar with this listed allergy  . Conjugated Estrogens Other (See Comments)    REACTION: flushing  . Morphine And Related Other (See Comments)    Morphine injection Neck pain  . Codeine Other (See Comments)    REACTION: dizziness  . Iohexol      Desc: pt has anxiety and an irregular heartbeat. contrast exacerbates this. pt was very uncomfortable after a 20cc bolus for an miroi. we did w/o iv 11/09 per dr. Kris Hartmann. pt will talk w/ md about this and will probably not get iv contrast in the future at her re, Onset Date: [id number]   . Adenosine Other (See Comments)    Elevated heart rate when doing stress test     Current Outpatient Prescriptions on File Prior to Visit  Medication Sig Dispense Refill  . acetaminophen (TYLENOL) 325 MG tablet Take 325 mg by mouth every 6 (six) hours as needed. For pain.    Marland Kitchen amLODipine (NORVASC) 2.5 MG tablet Take 1 tablet (2.5  mg total) by mouth daily. (Patient taking differently: Take 2.5 mg by mouth every evening. 3:00 or 4:00 pm) 90 tablet 3  . aspirin EC 81 MG tablet Take 81 mg by mouth at bedtime.     . clonazePAM (KLONOPIN) 0.5 MG tablet Take 0.125 mg by mouth as needed for anxiety.     Marland Kitchen labetalol (NORMODYNE) 200 MG tablet Take 1/2 tab (100mg ) by mouth every morning & 1 tab (200mg ) every evening    . Multiple Vitamins-Minerals (PRESERVISION/LUTEIN) CAPS Take 1 capsule by mouth 2 (two) times daily.     . polyethylene glycol (MIRALAX / GLYCOLAX) packet Take 17 g by mouth every morning. Constipation    . rosuvastatin (CRESTOR) 5 MG tablet Take 1 tablet (5 mg total) by mouth daily. 90 tablet 3   No current facility-administered medications on file prior to visit.     Past Medical History  Diagnosis Date  . Essential hypertension, benign   . Non-obstructive CAD     a. Reportedly nonobstructive at catheterization in the 1990s;  b. 03/2014 Cath: LM nl, LAD 30-40p, 17m, D1 20ost, D2 50ost/p, D3 small, LCX nl/nondominant, OM 1/2/3 nl, RCA 20-30p, 2m, PDA nl, RPL1/2  small, nl.  . Macular degeneration   . Anxiety disorder   . Diverticulosis     Left sided noted on colonoscopy 02/2006  . Chronic constipation   . Esophageal reflux disease   . Lumbar radiculopathy   . Hyperlipidemia   .  Hiatal hernia   . Arthritis   . Penetrating atherosclerotic ulcer of aorta     Complex ulcerated plaque of the infrarenal abdominal aorta mild renal artery stenosis on the right side, normal left renal artery catheterization 2009, status post aortic stent graft  . Claudication     Chronic occlusion of the right limb of aortic stent graft.  . Trigeminal neuralgia     Right  . Type 2 diabetes mellitus   . Anemia   . Migraines   . Renal artery stenosis     a. Right renal artery stent April 2013 - Dr. Fletcher Anon;  b. 03/2014 PTA/stenting of RRA 2/2 ISR: 6x18 Herculink stent.  . Ectopic atrial tachycardia      Past Surgical History    Procedure Laterality Date  . Carotid endarterectomy Left 1993  . Endologix powerlink bifurcated      Covered stent graft  . Tonsillectomy and adenoidectomy  1947  . Abdominal hysterectomy  1982  . Lumbar disc surgery  1981  . Breast biopsy Left 1990's  . Abdominal aortagram N/A 02/13/2012    Procedure: ABDOMINAL Maxcine Ham;  Surgeon: Wellington Hampshire, MD;  Location: [facility] C[facility] LAB;  Service: Cardiovascular;  Laterality: N/A;  . Renal angiogram N/A 02/13/2012    Procedure: RENAL ANGIOGRAM;  Surgeon: Wellington Hampshire, MD;  Location: [place] CATH LAB;  Service: Cardiovascular;  Laterality: N/A;  . Abdominal aortagram N/A 03/17/2014    Procedure: ABDOMINAL Maxcine Ham;  Surgeon: Wellington Hampshire, MD;  Location: [place] CATH LAB;  Service: Cardiovascular;  Laterality: N/A;  . Renal angiogram N/A 03/17/2014    Procedure: RENAL ANGIOGRAM;  Surgeon: Wellington Hampshire, MD;  Location: [place] CATH LAB;  Service: Cardiovascular;  Laterality: N/A;  . Left heart catheterization with coronary angiogram N/A 03/17/2014    Procedure: LEFT HEART CATHETERIZATION WITH CORONARY ANGIOGRAM;  Surgeon: Wellington Hampshire, MD;  Location: [place] CATH LAB;  Service: Cardiovascular;  Laterality: N/A;  . Percutaneous stent intervention Right 03/17/2014    Procedure: PERCUTANEOUS STENT INTERVENTION;  Surgeon: Wellington Hampshire, MD;  Location: [place] CATH LAB;  Service: Cardiovascular;  Laterality: Right;  Renal     Family History  Problem Relation Age of Onset  . Heart failure Mother   . Coronary artery disease Mother   . Heart disease Mother   . Heart attack Father   . Heart disease Father   . Heart disease Brother   . Hypertension Mother   . Hypertension Sister   . Hypertension Brother   . Hypertension Father   . Stroke Mother   . Stroke Sister      History   Social History  . Marital Status: Married    Spouse Name: N/A  . Number of Children: N/A  . Years of Education: N/A   Occupational History  . Retired     Scientist, clinical (histocompatibility and immunogenetics) at [employer] History Main Topics  . Smoking status: Former Smoker -- 0.20 packs/day for 1 years    Types: Cigarettes    Quit date: 11/13/1979  . Smokeless tobacco: Never Used  . Alcohol Use: No  . Drug Use: No  . Sexual Activity: No   Other Topics Concern  . Not on file   Social History Narrative     PHYSICAL EXAM   BP 180/100 mmHg  Pulse 77  Ht 5\' 2"  (1.575 m)  Wt 120 lb (54.432 kg)  BMI 21.94 kg/m2 Constitutional: She is oriented to person, place, and time. She appears well-developed and  well-nourished. No distress.  HENT: No nasal discharge.  Head: Normocephalic and atraumatic.  Eyes: Pupils are equal and round. Right eye exhibits no discharge. Left eye exhibits no discharge.  Neck: Normal range of motion. Neck supple. No JVD present. No thyromegaly present.  Cardiovascular: Normal rate, regular rhythm, normal heart sounds. Exam reveals no gallop and no friction rub. No murmur heard.  Pulmonary/Chest: Effort normal and breath sounds normal. No stridor. No respiratory distress. She has no wheezes. She has no rales. She exhibits no tenderness.  Abdominal: Soft. Bowel sounds are normal. She exhibits no distension. There is no tenderness. There is no rebound and no guarding.  Musculoskeletal: Normal range of motion. She exhibits no edema and no tenderness.  Neurological: She is alert and oriented to person, place, and time. Coordination normal.  Skin: Skin is warm and dry. No rash noted. She is not diaphoretic. No erythema. No pallor.  Psychiatric: She has a normal mood and affect. Her behavior is normal. Judgment and thought content normal.     ASSESSMENT AND PLAN

## 2015-05-31 NOTE — Patient Instructions (Signed)
Medication Instructions:  Your physician recommends that you continue on your current medications as directed. Please refer to the Current Medication list given to you today.  Labwork: No new orders.   Testing/Procedures: Your physician has requested that you have a renal artery duplex in December [facility] office). During this test, an ultrasound is used to evaluate blood flow to the kidneys. Allow one hour for this exam. Do not eat after midnight the day before and avoid carbonated beverages. Take your medications as you usually do.  Follow-Up: Your physician wants you to follow-up in: 1 YEAR with Dr Fletcher Anon.  You will receive a reminder letter in the mail two months in advance. If you don't receive a letter, please call our office to schedule the follow-up appointment.   Any Other Special Instructions Will Be Listed Below (If Applicable).

## 2015-05-31 NOTE — Assessment & Plan Note (Signed)
Most recent renal artery duplex in December showed patent stent. I recommend repeat Doppler in December of this year.

## 2015-05-31 NOTE — Assessment & Plan Note (Signed)
The patient seems to have white coat syndrome. Continue to monitor her pressure at home and can use clonidine as needed if systolic blood pressure exceeds 160 mmHg which according the patient is extremely rare. She reports episodes of hypotension when she takes full dose labetalol in the morning.

## 2015-08-29 ENCOUNTER — Telehealth: Payer: Self-pay | Admitting: Cardiology

## 2015-08-29 DIAGNOSIS — E785 Hyperlipidemia, unspecified: Secondary | ICD-10-CM

## 2015-08-29 DIAGNOSIS — Z5181 Encounter for therapeutic drug level monitoring: Secondary | ICD-10-CM

## 2015-08-29 NOTE — Telephone Encounter (Signed)
6 mth f/u patient wants to come before Nov and see how she is doing with her cholesterol medication she needs to know if she Needs labs before this visit.

## 2015-08-29 NOTE — Telephone Encounter (Signed)
Yes, okay to order

## 2015-08-29 NOTE — Telephone Encounter (Signed)
Patient advised that lab work orders will be sent to to [facility] upon approval from MD. Please advise if its okay to order FLP or FLP & LFT's.

## 2015-08-30 NOTE — Telephone Encounter (Signed)
Lab orders faxed to [facility] lab.

## 2015-09-06 ENCOUNTER — Encounter: Payer: Self-pay | Admitting: Cardiology

## 2015-09-06 ENCOUNTER — Encounter: Payer: Self-pay | Admitting: *Deleted

## 2015-09-06 ENCOUNTER — Ambulatory Visit (INDEPENDENT_AMBULATORY_CARE_PROVIDER_SITE_OTHER): Payer: Medicare Other | Admitting: Cardiology

## 2015-09-06 VITALS — BP 130/92 | HR 75 | Ht 62.0 in | Wt 117.0 lb

## 2015-09-06 DIAGNOSIS — I471 Supraventricular tachycardia: Secondary | ICD-10-CM | POA: Diagnosis not present

## 2015-09-06 DIAGNOSIS — I2510 Atherosclerotic heart disease of native coronary artery without angina pectoris: Secondary | ICD-10-CM | POA: Diagnosis not present

## 2015-09-06 DIAGNOSIS — I10 Essential (primary) hypertension: Secondary | ICD-10-CM

## 2015-09-06 DIAGNOSIS — I701 Atherosclerosis of renal artery: Secondary | ICD-10-CM | POA: Diagnosis not present

## 2015-09-06 DIAGNOSIS — I739 Peripheral vascular disease, unspecified: Secondary | ICD-10-CM

## 2015-09-06 DIAGNOSIS — E785 Hyperlipidemia, unspecified: Secondary | ICD-10-CM

## 2015-09-06 NOTE — Progress Notes (Signed)
Cardiology Office Note  Date: 09/06/2015   ID: [name], DOB Dec 12, 1935, MRN [id number]  PCP: Glenda Chroman., MD  Primary Cardiologist: Rozann Lesches, MD   Chief Complaint  Patient presents with  . Coronary Artery Disease    History of Present Illness: [name] is a medically complex 79 y.o. female last seen in May. Interval visit with Dr. Fletcher Anon noted in July - I reviewed the note. She presents today for a follow-up visit. Fortunately, she remains stable without any active angina symptoms. We discussed her medications which are outlined below.  We stopped Lipitor due to complaints of arthralgias. She is now on low-dose Crestor. Follow-up lab work is reviewed today, LDL is 102, HDL 54, and normal LFTs. She reports no obvious problems with Crestor. We discussed possibility of further increasing the dose, also advancing her exercise plan. She did not want to a make a dose change yet.  She continues to check blood pressure at home, reports systolics usually in the 782N and 130s. She has not required any additional clonidine. She does have whitecoat hypertension which we have discussed in the past. Blood pressure today is better than it was at last encounter.  She has had no palpitations or obvious recurrent EAT. Last ECG in April showed normal sinus rhythm.  Past Medical History  Diagnosis Date  . Essential hypertension, benign   . Non-obstructive CAD     a. Reportedly nonobstructive at catheterization in the 1990s;  b. 03/2014 Cath: LM nl, LAD 30-40p, 66m, D1 20ost, D2 50ost/p, D3 small, LCX nl/nondominant, OM 1/2/3 nl, RCA 20-30p, 42m, PDA nl, RPL1/2  small, nl.  . Macular degeneration   . Anxiety disorder   . Diverticulosis     Left sided noted on colonoscopy 02/2006  . Chronic constipation   . Esophageal reflux disease   . Lumbar radiculopathy   . Hyperlipidemia   . Hiatal hernia   . Arthritis   . Penetrating atherosclerotic ulcer of aorta (HCC)     Complex  ulcerated plaque of the infrarenal abdominal aorta mild renal artery stenosis on the right side, normal left renal artery catheterization 2009, status post aortic stent graft  . Claudication [facility])     Chronic occlusion of the right limb of aortic stent graft.  . Trigeminal neuralgia     Right  . Type 2 diabetes mellitus ([place])   . Anemia   . Migraines   . Renal artery stenosis ([place])     a. Right renal artery stent April 2013 - Dr. Fletcher Anon;  b. 03/2014 PTA/stenting of RRA 2/2 ISR: 6x18 Herculink stent.  . Ectopic atrial tachycardia [facility])     Past Surgical History  Procedure Laterality Date  . Carotid endarterectomy Left 1993  . Endologix powerlink bifurcated      Covered stent graft  . Tonsillectomy and adenoidectomy  1947  . Abdominal hysterectomy  1982  . Lumbar disc surgery  1981  . Breast biopsy Left 1990's  . Abdominal aortagram N/A 02/13/2012    Procedure: ABDOMINAL Maxcine Ham;  Surgeon: Wellington Hampshire, MD;  [facility] LAB;  Service: Cardiovascular;  Laterality: N/A;  . Renal angiogram N/A 02/13/2012    Procedure: RENAL ANGIOGRAM;  Surgeon: Wellington Hampshire, MD;  Location: [place] CATH LAB;  Service: Cardiovascular;  Laterality: N/A;  . Abdominal aortagram N/A 03/17/2014    Procedure: ABDOMINAL Maxcine Ham;  Surgeon: Wellington Hampshire, MD;  Location: [place] CATH LAB;  Service: Cardiovascular;  Laterality: N/A;  .  Renal angiogram N/A 03/17/2014    Procedure: RENAL ANGIOGRAM;  Surgeon: Wellington Hampshire, MD;  Location: [facility] CATH LAB;  Service: Cardiovascular;  Laterality: N/A;  . Left heart catheterization with coronary angiogram N/A 03/17/2014    Procedure: LEFT HEART CATHETERIZATION WITH CORONARY ANGIOGRAM;  Surgeon: Wellington Hampshire, MD;  Location: [place] CATH LAB;  Service: Cardiovascular;  Laterality: N/A;  . Percutaneous stent intervention Right 03/17/2014    Procedure: PERCUTANEOUS STENT INTERVENTION;  Surgeon: Wellington Hampshire, MD;  Location: [place] CATH LAB;  Service: Cardiovascular;  Laterality:  Right;  Renal    Current Outpatient Prescriptions  Medication Sig Dispense Refill  . acetaminophen (TYLENOL) 325 MG tablet Take 325 mg by mouth every 6 (six) hours as needed. For pain.    Marland Kitchen amLODipine (NORVASC) 2.5 MG tablet Take 1 tablet (2.5 mg total) by mouth daily. 90 tablet 3  . aspirin EC 81 MG tablet Take 81 mg by mouth at bedtime.     . Cholecalciferol (VITAMIN D3) 1000 UNITS CAPS Take 1,000 capsules by mouth daily.    . clonazePAM (KLONOPIN) 0.5 MG tablet Take 0.125 mg by mouth as needed for anxiety.     Marland Kitchen labetalol (NORMODYNE) 200 MG tablet Take 1/2 tab (100mg ) by mouth every morning & 1 tab (200mg ) every evening    . Multiple Vitamins-Minerals (PRESERVISION/LUTEIN) CAPS Take 1 capsule by mouth 2 (two) times daily.     . polyethylene glycol (MIRALAX / GLYCOLAX) packet Take 17 g by mouth every morning. Constipation    . rosuvastatin (CRESTOR) 5 MG tablet Take 1 tablet (5 mg total) by mouth daily. 90 tablet 3   No current facility-administered medications for this visit.    Allergies:  Cefixime; Conjugated estrogens; Morphine and related; Codeine; Iohexol; and Adenosine   Social History: The patient  reports that she quit smoking about 35 years ago. Her smoking use included Cigarettes. She has a .2 pack-year smoking history. She has never used smokeless tobacco. She reports that she does not drink alcohol or use illicit drugs.   ROS:  Please see the history of present illness. Otherwise, complete review of systems is positive for none.  All other systems are reviewed and negative.   Physical Exam: VS:  BP 130/92 mmHg  Pulse 75  Ht 5\' 2"  (1.575 m)  Wt 117 lb (53.071 kg)  BMI 21.39 kg/m2  SpO2 99%, BMI Body mass index is 21.39 kg/(m^2).  Wt Readings from Last 3 Encounters:  09/06/15 117 lb (53.071 kg)  05/31/15 120 lb (54.432 kg)  04/12/15 124 lb (56.246 kg)     Patient in no distress.  HEENT: Conjunctiva and lids normal, oropharynx clear.  Neck: Supple, no elevated  JVP, right carotid bruit, no thyromegaly.  Lungs: Clear to auscultation, nonlabored breathing at rest.  Cardiac: Regular rate and rhythm, no S3, very soft basal systolic murmur, no pericardial rub.  Abdomen: Soft, nontender, bowel sounds present.  Extremities: No pitting edema, distal pulses 2+.  Skin: Warm and dry. Musculoskeletal: No kyphosis. Neuropsychiatric: Alert and oriented 3, affect appropriate.   ECG: ECG is not ordered today.   Recent Labwork:  June 2016: BUN 18, creatinine 0.8, potassium 4.4, AST 24, ALT 23  Other Studies Reviewed Today:  Echocardiogram 03/09/2015: Study Conclusions  - Left ventricle: The cavity size was normal. Systolic function was normal. The estimated ejection fraction was in the range of 55% to 60%. Wall motion was normal; there were no regional wall motion abnormalities. Doppler parameters are consistent with  abnormal left ventricular relaxation (grade 1 diastolic dysfunction). Doppler parameters are consistent with high ventricular filling pressure. Mild focal basal septal hypertrophy. - Aortic valve: Mildly calcified annulus. Trileaflet; mildly thickened leaflets. - Mitral valve: Mild to moderately thickened leaflets . There was trivial regurgitation. - Left atrium: The atrium was mildly dilated. - Tricuspid valve: There was mild-moderate regurgitation. - Pulmonic valve: There was mild regurgitation.   Assessment and Plan:  1. Mild to moderate multivessel CAD without active angina symptoms. Plan to continue medical therapy, also recommended increasing walking for exercise.  2. History of ectopic atrial tachycardia, no recent palpitations. Last ECG showed normal sinus rhythm. She continues on labetalol, concurrently for management of hypertension.  3. Essential hypertension and whitecoat hypertension. No changes made to current regimen.  4. Peripheral arterial disease status post aortic stent graft repair of  penetrating aortic ulcer and renal artery stenosis status post BMS to the right renal artery. She continues to follow with Dr. Fletcher Anon.  5. Hyperlipidemia, tolerating Crestor. Increase exercise plan. Follow-up FLP for next visit.  Current medicines were reviewed with the patient today.  Disposition: FU with me in 6 months.   Signed, [name], MD, [facility] 09/06/2015 12:14 PM    [facility] [place], [place], [place] [address] Phone: [phone]; Fax: [phone]

## 2015-09-06 NOTE — Patient Instructions (Signed)
Your physician recommends that you continue on your current medications as directed. Please refer to the Current Medication list given to you today. Your physician recommends that you return for a FASTING lipid profile: in 6 months just before your next visit. Your physician recommends that you schedule a follow-up appointment in: 6 months. You will receive a reminder letter in the mail in about 4 months reminding you to call and schedule your appointment. If you don't receive this letter, please contact our office.

## 2015-10-19 ENCOUNTER — Ambulatory Visit: Payer: Medicare Other

## 2015-10-19 DIAGNOSIS — I701 Atherosclerosis of renal artery: Secondary | ICD-10-CM

## 2015-11-03 ENCOUNTER — Other Ambulatory Visit: Payer: Self-pay | Admitting: Cardiology

## 2016-02-01 DIAGNOSIS — R51 Headache: Secondary | ICD-10-CM | POA: Diagnosis not present

## 2016-02-01 DIAGNOSIS — I10 Essential (primary) hypertension: Secondary | ICD-10-CM | POA: Diagnosis not present

## 2016-02-01 DIAGNOSIS — Z789 Other specified health status: Secondary | ICD-10-CM | POA: Diagnosis not present

## 2016-02-02 DIAGNOSIS — I10 Essential (primary) hypertension: Secondary | ICD-10-CM | POA: Diagnosis not present

## 2016-02-02 DIAGNOSIS — H6982 Other specified disorders of Eustachian tube, left ear: Secondary | ICD-10-CM | POA: Diagnosis not present

## 2016-02-02 DIAGNOSIS — R51 Headache: Secondary | ICD-10-CM | POA: Diagnosis not present

## 2016-02-06 DIAGNOSIS — F419 Anxiety disorder, unspecified: Secondary | ICD-10-CM | POA: Diagnosis not present

## 2016-02-06 DIAGNOSIS — R51 Headache: Secondary | ICD-10-CM | POA: Diagnosis not present

## 2016-02-06 DIAGNOSIS — I10 Essential (primary) hypertension: Secondary | ICD-10-CM | POA: Diagnosis not present

## 2016-02-07 ENCOUNTER — Encounter: Payer: Self-pay | Admitting: Physician Assistant

## 2016-02-07 ENCOUNTER — Ambulatory Visit (INDEPENDENT_AMBULATORY_CARE_PROVIDER_SITE_OTHER): Payer: Medicare Other | Admitting: Physician Assistant

## 2016-02-07 VITALS — BP 180/92 | HR 77 | Ht 63.0 in | Wt 117.0 lb

## 2016-02-07 DIAGNOSIS — R519 Headache, unspecified: Secondary | ICD-10-CM | POA: Insufficient documentation

## 2016-02-07 DIAGNOSIS — R51 Headache: Secondary | ICD-10-CM

## 2016-02-07 DIAGNOSIS — I2510 Atherosclerotic heart disease of native coronary artery without angina pectoris: Secondary | ICD-10-CM

## 2016-02-07 DIAGNOSIS — I10 Essential (primary) hypertension: Secondary | ICD-10-CM

## 2016-02-07 DIAGNOSIS — G44021 Chronic cluster headache, intractable: Secondary | ICD-10-CM | POA: Diagnosis not present

## 2016-02-07 NOTE — Assessment & Plan Note (Addendum)
Patient has had high blood pressures recently that are uncontrolled. She was also given a shot of steroids last week and is having severe headaches. She doesn't like taking the extra labetalol as it causes fatigue. I asked her to start clonidine 0.1 mg daily, continue labetalol 200 mg half a tablet in the morning a whole tablet in the evening, may be able to increase amlodipine if necessary. She has follow-up with Dr. Domenic Polite next month. She is to call if her blood pressures stay elevated. 2 g sodium diet has been reviewed. She has had stenting for severe renal artery stenosis in the past. This may need to be reassessed if blood pressure continues to stay up.

## 2016-02-07 NOTE — Progress Notes (Signed)
Cardiology Office Note   Date:  02/07/2016   ID:  [name], DOB 17-Jul-1936, MRN [id number]  PCP:  Glenda Chroman., MD  Cardiologist:  Dr. Domenic Polite   Chief Complaint: High blood pressure    History of Present Illness: [name] is a 80 y.o. female who presents for elevated blood pressures.  She has a history of mild to moderate multivessel CAD, history of ectopic atrial tachycardia treated with labetalol, hypertension and white coat syndrome, peripheral arterial disease status post aortic stent graft repair of penetrating aortic ulcer and renal artery stenosis status post BMS to the right renal artery followed by Dr. Fletcher Anon, hyperlipidemia. Last heart cath in 2000 1530-40% proximal LAD 40% mid LAD 50% diagonal 2, mild diffuse atherosclerosis in the circumflex without obstruction, 20-30% proximal RCA 40% mid RCA. Last 2-D echo 02/2015 normal LV function EF 55-60% with grade 1 DD.  Patient comes in today complaining of elevated blood pressures. She is also having severe headaches which she's had all her life but they seem to have gotten worse. She got a shot of prednisone and was given a prescription for Voltaren by Dr. Woody Seller last week. She continues to have severe headaches. He also increased her labetalol to whole tablet twice a day but she only took a half a tablet this morning as her blood pressure was 111/59. She took the other half recently because her blood pressure began to climb. She only takes clonidine when necessary for blood pressures over 170. She complains of throbbing pain up the back of her neck and sometimes on her face as well. Past Medical History  Diagnosis Date  . Essential hypertension, benign   . Non-obstructive CAD     a. Reportedly nonobstructive at catheterization in the 1990s;  b. 03/2014 Cath: LM nl, LAD 30-40p, 77m, D1 20ost, D2 50ost/p, D3 small, LCX nl/nondominant, OM 1/2/3 nl, RCA 20-30p, 34m, PDA nl, RPL1/2  small, nl.  . Macular degeneration   . Anxiety  disorder   . Diverticulosis     Left sided noted on colonoscopy 02/2006  . Chronic constipation   . Esophageal reflux disease   . Lumbar radiculopathy   . Hyperlipidemia   . Hiatal hernia   . Arthritis   . Penetrating atherosclerotic ulcer of aorta (HCC)     Complex ulcerated plaque of the infrarenal abdominal aorta mild renal artery stenosis on the right side, normal left renal artery catheterization 2009, status post aortic stent graft  . Claudication [facility])     Chronic occlusion of the right limb of aortic stent graft.  . Trigeminal neuralgia     Right  . Type 2 diabetes mellitus ([place])   . Anemia   . Migraines   . Renal artery stenosis ([place])     a. Right renal artery stent April 2013 - Dr. Fletcher Anon;  b. 03/2014 PTA/stenting of RRA 2/2 ISR: 6x18 Herculink stent.  . Ectopic atrial tachycardia [facility])     Past Surgical History  Procedure Laterality Date  . Carotid endarterectomy Left 1993  . Endologix powerlink bifurcated      Covered stent graft  . Tonsillectomy and adenoidectomy  1947  . Abdominal hysterectomy  1982  . Lumbar disc surgery  1981  . Breast biopsy Left 1990's  . Abdominal aortagram N/A 02/13/2012    Procedure: ABDOMINAL Maxcine Ham;  Surgeon: Wellington Hampshire, MD;  Location: [facility] CATH LAB;  Service: Cardiovascular;  Laterality: N/A;  . Renal angiogram N/A 02/13/2012    Procedure: RENAL  ANGIOGRAM;  Surgeon: Wellington Hampshire, MD;  Location: [facility] CATH LAB;  Service: Cardiovascular;  Laterality: N/A;  . Abdominal aortagram N/A 03/17/2014    Procedure: ABDOMINAL Maxcine Ham;  Surgeon: Wellington Hampshire, MD;  Location: [place] CATH LAB;  Service: Cardiovascular;  Laterality: N/A;  . Renal angiogram N/A 03/17/2014    Procedure: RENAL ANGIOGRAM;  Surgeon: Wellington Hampshire, MD;  Location: [place] CATH LAB;  Service: Cardiovascular;  Laterality: N/A;  . Left heart catheterization with coronary angiogram N/A 03/17/2014    Procedure: LEFT HEART CATHETERIZATION WITH CORONARY ANGIOGRAM;  Surgeon: Wellington Hampshire, MD;  Location: [place] CATH LAB;  Service: Cardiovascular;  Laterality: N/A;  . Percutaneous stent intervention Right 03/17/2014    Procedure: PERCUTANEOUS STENT INTERVENTION;  Surgeon: Wellington Hampshire, MD;  Location: [place] CATH LAB;  Service: Cardiovascular;  Laterality: Right;  Renal     Current Outpatient Prescriptions  Medication Sig Dispense Refill  . acetaminophen (TYLENOL) 325 MG tablet Take 325 mg by mouth every 6 (six) hours as needed. For pain.    Marland Kitchen amLODipine (NORVASC) 2.5 MG tablet TAKE 1 TABLET DAILY 90 tablet 2  . aspirin EC 81 MG tablet Take 81 mg by mouth at bedtime.     . Cholecalciferol (VITAMIN D3) 1000 UNITS CAPS Take 1,000 capsules by mouth daily.    . clonazePAM (KLONOPIN) 0.5 MG tablet Take 0.125 mg by mouth as needed for anxiety.     . cloNIDine (CATAPRES) 0.1 MG tablet Take by mouth.    . labetalol (NORMODYNE) 200 MG tablet Take 1/2 tab (100mg ) by mouth every morning & 1 tab (200mg ) every evening    . Multiple Vitamins-Minerals (PRESERVISION/LUTEIN) CAPS Take 1 capsule by mouth 2 (two) times daily.     . polyethylene glycol (MIRALAX / GLYCOLAX) packet Take 17 g by mouth every morning. Constipation    . rosuvastatin (CRESTOR) 5 MG tablet Take 1 tablet (5 mg total) by mouth daily. 90 tablet 3   No current facility-administered medications for this visit.    Allergies:   Cefixime; Conjugated estrogens; Morphine and related; Codeine; Iohexol; and Adenosine    Social History:  The patient  reports that she quit smoking about 36 years ago. Her smoking use included Cigarettes. She has a .2 pack-year smoking history. She has never used smokeless tobacco. She reports that she does not drink alcohol or use illicit drugs.   Family History:  The patient's    family history includes Coronary artery disease in her mother; Heart attack in her father; Heart disease in her brother, father, and mother; Heart failure in her mother; Hypertension in her brother, father, mother, and  sister; Stroke in her mother and sister.    ROS:  Please see the history of present illness.   Otherwise, review of systems are positive for none.   All other systems are reviewed and negative.    PHYSICAL EXAM: VS:  BP 180/92 mmHg  Pulse 77  Ht 5\' 3"  (1.6 m)  Wt 117 lb (53.071 kg)  BMI 20.73 kg/m2  SpO2 97% , BMI Body mass index is 20.73 kg/(m^2). GEN: Well nourished, well developed, in no acute distress Neck: no JVD, HJR, carotid bruits, or masses Cardiac:  RRR; no murmurs,gallop, rubs, thrill or heave,  Respiratory:  clear to auscultation bilaterally, normal work of breathing GI: soft, nontender, nondistended, + BS MS: no deformity or atrophy Extremities: without cyanosis, clubbing, edema, good distal pulses bilaterally.  Skin: warm and dry, no rash Neuro:  Strength  and sensation are intact    EKG:  EKG is not ordered today.   Recent Labs: No results found for requested labs within last 365 days.    Lipid Panel    Component Value Date/Time   CHOL  12/21/2007 0415    125        ATP III CLASSIFICATION:  <200     mg/dL   Desirable  200-239  mg/dL   Borderline High  >=240    mg/dL   High   TRIG 114 12/21/2007 0415   HDL 33* 12/21/2007 0415   CHOLHDL 3.8 12/21/2007 0415   VLDL 23 12/21/2007 0415   LDLCALC  12/21/2007 0415    69        Total Cholesterol/HDL:CHD Risk Coronary Heart Disease Risk Table                     Men   Women  1/2 Average Risk   3.4   3.3      Wt Readings from Last 3 Encounters:  02/07/16 117 lb (53.071 kg)  09/06/15 117 lb (53.071 kg)  05/31/15 120 lb (54.432 kg)      Other studies Reviewed: Additional studies/ records that were reviewed today include and review of the records demonstrates:   Coronary angiography: Coronary dominance:  Right     Left Main:  The left coronary system is heavily calcified. The left main is short with minor irregularities.  Left Anterior Descending (LAD):  Normal in size. There is 30-40% proximal  tubular stenosis followed by 40% disease in the midsegment. Minor irregularities are noted distally.  1st diagonal (D1):  Normal in size with 20% ostial stenosis.  2nd diagonal (D2):  Medium in size with 50% ostial and proximal stenosis.  3rd diagonal (D3):  Very small in size.  Circumflex (LCx):  Normal in size and nondominant. The vessel has mild diffuse atherosclerosis without obstructive disease.  1st obtuse marginal:  Very small in size.  2nd obtuse marginal:  Very small in size.  3rd obtuse marginal:  Normal in size with no significant disease.          Right Coronary Artery:  Severely calcified and tortuous. There is 20-30% proximal stenosis and 40% mid stenosis. The rest of the vessel has minor irregularities.  Posterior descending artery:  Normal in size with minor irregularities.  Posterior AV segment:  Normal in size without significant disease.  Posterolateral branchs:  PL 1 is very small in size. PL 2 is normal in size with minor irregularities.  Left ventriculography:  Was not performed.  PV procedure note:: Abdominal aortogram and nonselective renal angiography was performed with a pigtail catheter in the AP position. This showed 90% ostial stenosis in the right renal artery. It appears that the previously placed stent was 1-2 mm short of the ostium. A stent graft is noted in the aorta. It appears to be intact. The right iliac limb is known to be occluded. The patient was given 3500 of unfractionated heparin followed by another 1500 units of heparin to achieve an ACT slightly above 200. I used an IM 6 [place] guide to engage the right renal artery. Selective angiography was performed. The lesion was crossed with a [place] core wire. I then used a 5 x 15 mm balloon to predilate the lesion. I decided to place another stent in order to cover the ostium and used a 6 x 18 mm Herculink stent which was deployed to 10 atmosphere. The balloon  was pulled back and inflated again at the  ostium to 12 atmospheres. Final angiography showed excellent results. The catheter and wire were removed. The site was closed with a Mynx device.  Final Conclusions:   1. Mild to moderate nonobstructive coronary artery disease with heavily calcified vessels. 2. Severely elevated blood pressure of and left ventricular end-diastolic pressure. 3. Severe ostial right renal artery stenosis. 4. Successful angioplasty and stent placement to the right renal artery.  Recommendations:  Observed overnight with hydration. The patient tends to have very labile blood pressure. Continue Plavix for at least one month.  Wellington Hampshire MD, [facility] 03/17/2014, 1:56 PM    Echocardiogram 03/09/2015: Study Conclusions  - Left ventricle: The cavity size was normal. Systolic function was   normal. The estimated ejection fraction was in the range of 55%   to 60%. Wall motion was normal; there were no regional wall   motion abnormalities. Doppler parameters are consistent with   abnormal left ventricular relaxation (grade 1 diastolic   dysfunction). Doppler parameters are consistent with high   ventricular filling pressure. Mild focal basal septal   hypertrophy. - Aortic valve: Mildly calcified annulus. Trileaflet; mildly   thickened leaflets. - Mitral valve: Mild to moderately thickened leaflets . There was   trivial regurgitation. - Left atrium: The atrium was mildly dilated. - Tricuspid valve: There was mild-moderate regurgitation. - Pulmonic valve: There was mild regurgitation.     ASSESSMENT AND PLAN: Essential (primary) hypertension Patient has had high blood pressures recently that are uncontrolled. She was also given a shot of steroids last week and is having severe headaches. She doesn't like taking the extra labetalol as it causes fatigue. I asked her to start clonidine 0.1 mg daily, continue labetalol 200 mg half a tablet in the morning a whole tablet in the evening, may be able to increase  amlodipine if necessary. She has follow-up with Dr. Domenic Polite next month. She is to call if her blood pressures stay elevated. 2 g sodium diet has been reviewed. She has had stenting for severe renal artery stenosis in the past. This may need to be reassessed if blood pressure continues to stay up.  Headache Patient has had chronic headaches but this is unrelenting. She can't find any relief. I don't believe it's coming from her elevated blood pressures. Recommend seeing a neurologist. She wants to think about it before we refer her and she will call us if she wants to proceed.  Coronary atherosclerosis of native coronary artery Stable without chest pain     Signed, Ermalinda Barrios, PA-C  02/07/2016 1:55 PM    [facility] [place], [place], [place]  [address] Phone: [phone]; Fax: [phone]

## 2016-02-07 NOTE — Patient Instructions (Signed)
Your physician recommends that you schedule a follow-up appointment with Dr. Domenic Polite  Your physician recommends that you continue on your current medications as directed. Please refer to the Current Medication list given to you today.  If you need a refill on your cardiac medications before your next appointment, please call your pharmacy.  Thank you for choosing [facility]!    Low-Sodium Eating Plan Sodium raises blood pressure and causes water to be held in the body. Getting less sodium from food will help lower your blood pressure, reduce any swelling, and protect your heart, liver, and kidneys. We get sodium by adding salt (sodium chloride) to food. Most of our sodium comes from canned, boxed, and frozen foods. Restaurant foods, fast foods, and pizza are also very high in sodium. Even if you take medicine to lower your blood pressure or to reduce fluid in your body, getting less sodium from your food is important. WHAT IS MY PLAN? Most people should limit their sodium intake to 2,300 mg a day. Your health care provider recommends that you limit your sodium intake to __________ a day.  WHAT DO I NEED TO KNOW ABOUT THIS EATING PLAN? For the low-sodium eating plan, you will follow these general guidelines:  Choose foods with a % Daily Value for sodium of less than 5% (as listed on the food label).   Use salt-free seasonings or herbs instead of table salt or sea salt.   Check with your health care provider or pharmacist before using salt substitutes.   Eat fresh foods.  Eat more vegetables and fruits.  Limit canned vegetables. If you do use them, rinse them well to decrease the sodium.   Limit cheese to 1 oz (28 g) per day.   Eat lower-sodium products, often labeled as "lower sodium" or "no salt added."  Avoid foods that contain monosodium glutamate (MSG). MSG is sometimes added to [place] food and some canned foods.  Check food labels (Nutrition Facts labels) on  foods to learn how much sodium is in one serving.  Eat more home-cooked food and less restaurant, buffet, and fast food.  When eating at a restaurant, ask that your food be prepared with less salt, or no salt if possible.  HOW DO I READ FOOD LABELS FOR SODIUM INFORMATION? The Nutrition Facts label lists the amount of sodium in one serving of the food. If you eat more than one serving, you must multiply the listed amount of sodium by the number of servings. Food labels may also identify foods as:  Sodium free--Less than 5 mg in a serving.  Very low sodium--35 mg or less in a serving.  Low sodium--140 mg or less in a serving.  Light in sodium--50% less sodium in a serving. For example, if a food that usually has 300 mg of sodium is changed to become light in sodium, it will have 150 mg of sodium.  Reduced sodium--25% less sodium in a serving. For example, if a food that usually has 400 mg of sodium is changed to reduced sodium, it will have 300 mg of sodium. WHAT FOODS CAN I EAT? Grains Low-sodium cereals, including oats, puffed wheat and rice, and shredded wheat cereals. Low-sodium crackers. Unsalted rice and pasta. Lower-sodium bread.  Vegetables Frozen or fresh vegetables. Low-sodium or reduced-sodium canned vegetables. Low-sodium or reduced-sodium tomato sauce and paste. Low-sodium or reduced-sodium tomato and vegetable juices.  Fruits Fresh, frozen, and canned fruit. Fruit juice.  Meat and Other Protein Products Low-sodium canned tuna and  salmon. Fresh or frozen meat, poultry, seafood, and fish. Lamb. Unsalted nuts. Dried beans, peas, and lentils without added salt. Unsalted canned beans. Homemade soups without salt. Eggs.  Dairy Milk. Soy milk. Ricotta cheese. Low-sodium or reduced-sodium cheeses. Yogurt.  Condiments Fresh and dried herbs and spices. Salt-free seasonings. Onion and garlic powders. Low-sodium varieties of mustard and ketchup. Fresh or refrigerated  horseradish. Lemon juice.  Fats and Oils Reduced-sodium salad dressings. Unsalted butter.  Other Unsalted popcorn and pretzels.  The items listed above may not be a complete list of recommended foods or beverages. Contact your dietitian for more options. WHAT FOODS ARE NOT RECOMMENDED? Grains Instant hot cereals. Bread stuffing, pancake, and biscuit mixes. Croutons. Seasoned rice or pasta mixes. Noodle soup cups. Boxed or frozen macaroni and cheese. Self-rising flour. Regular salted crackers. Vegetables Regular canned vegetables. Regular canned tomato sauce and paste. Regular tomato and vegetable juices. Frozen vegetables in sauces. Salted [place] fries. Olives. Angie Fava. Relishes. Sauerkraut. Salsa. Meat and Other Protein Products Salted, canned, smoked, spiced, or pickled meats, seafood, or fish. Bacon, ham, sausage, hot dogs, corned beef, chipped beef, and packaged luncheon meats. Salt pork. Jerky. Pickled herring. Anchovies, regular canned tuna, and sardines. Salted nuts. Dairy Processed cheese and cheese spreads. Cheese curds. Blue cheese and cottage cheese. Buttermilk.  Condiments Onion and garlic salt, seasoned salt, table salt, and sea salt. Canned and packaged gravies. Worcestershire sauce. Tartar sauce. Barbecue sauce. Teriyaki sauce. Soy sauce, including reduced sodium. Steak sauce. Fish sauce. Oyster sauce. Cocktail sauce. Horseradish that you find on the shelf. Regular ketchup and mustard. Meat flavorings and tenderizers. Bouillon cubes. Hot sauce. Tabasco sauce. Marinades. Taco seasonings. Relishes. Fats and Oils Regular salad dressings. Salted butter. Margarine. Ghee. Bacon fat.  Other Potato and tortilla chips. Corn chips and puffs. Salted popcorn and pretzels. Canned or dried soups. Pizza. Frozen entrees and pot pies.  The items listed above may not be a complete list of foods and beverages to avoid. Contact your dietitian for more information.   This information is  not intended to replace advice given to you by your health care provider. Make sure you discuss any questions you have with your health care provider.   Document Released: 04/20/2002 Document Revised: 11/19/2014 Document Reviewed: 09/02/2013 Elsevier Interactive Patient Education [employer].

## 2016-02-07 NOTE — Assessment & Plan Note (Signed)
Stable without chest pain 

## 2016-02-07 NOTE — Assessment & Plan Note (Signed)
Patient has had chronic headaches but this is unrelenting. She can't find any relief. I don't believe it's coming from her elevated blood pressures. Recommend seeing a neurologist. She wants to think about it before we refer her and she will call us if she wants to proceed.

## 2016-02-08 ENCOUNTER — Telehealth: Payer: Self-pay | Admitting: Cardiology

## 2016-02-08 DIAGNOSIS — G44021 Chronic cluster headache, intractable: Secondary | ICD-10-CM

## 2016-02-08 NOTE — Telephone Encounter (Signed)
[name] called stating that she was seen in [place] on 02/07/16. She continues to have a bad headache. Requesting to speak to someone About her mediations.

## 2016-02-08 NOTE — Telephone Encounter (Signed)
Patient is requesting the neurology referral that was offered yesterday for her headaches. Patient request to see a neurologist in [place]. Patient advised that she would be contacted once this is scheduled.

## 2016-02-15 ENCOUNTER — Encounter: Payer: Self-pay | Admitting: Neurology

## 2016-02-15 ENCOUNTER — Ambulatory Visit (INDEPENDENT_AMBULATORY_CARE_PROVIDER_SITE_OTHER): Payer: Medicare Other | Admitting: Neurology

## 2016-02-15 VITALS — BP 180/102 | HR 84 | Resp 16 | Ht 63.0 in | Wt 117.0 lb

## 2016-02-15 DIAGNOSIS — R51 Headache: Secondary | ICD-10-CM | POA: Diagnosis not present

## 2016-02-15 DIAGNOSIS — R519 Headache, unspecified: Secondary | ICD-10-CM

## 2016-02-15 DIAGNOSIS — Z955 Presence of coronary angioplasty implant and graft: Secondary | ICD-10-CM | POA: Diagnosis not present

## 2016-02-15 DIAGNOSIS — I639 Cerebral infarction, unspecified: Secondary | ICD-10-CM

## 2016-02-15 DIAGNOSIS — I6381 Other cerebral infarction due to occlusion or stenosis of small artery: Secondary | ICD-10-CM

## 2016-02-15 NOTE — Progress Notes (Signed)
Subjective:    Patient ID: [name] is a 80 y.o. female.  HPI     Star Age, MD, PhD [facility] [address] [place], [place] [address]  Dear Dr. Domenic Polite,   I saw your patient, [name], upon your kind request in my neurologic clinic today for initial consultation of [name] recurrent headaches. The patient is unaccompanied today. As you know, [name] is a 80 year old right-handed woman with an underlying complex medical history of coronary artery disease, ectopic atrial tachycardia, hypertension, hyperlipidemia, penetrating aortic ulcer, renal artery stenosis, macular degeneration, anxiety, diverticulosis, reflux disease, lumbar radiculopathy, arthritis, status post multiple surgeries including left carotid endarterectomy, tonsillectomy, adenoidectomy, abdominal hysterectomy, lumbar spine surgery, coronary stent placement, who reports a long-standing history of recurrent headaches for the past 10 years, in the past few months this has become worse.  No N/V, some photophobia, but reports she has macular degeneration. All over HA, but different locations, pulsating. She takes clonazepam for anxiety. [name] previous PCP tried [name] on amitriptyline for suspected cluster HAs, but she had SEs.  [name] older sister had migraines and passed away last year at age 77.  Patient lives alone, husband passed away in 01-18-12.  She does not think she snores. She has a constant, daily HA.  She saw a rheumatologist last year in [place] for joint pain. She was on Lipitor at the time and was taking off of it and symptoms improved.  I reviewed your office note from 03/28 01/18/16. She had a brain MRI w/wo contrast on 03/06/2005: IMPRESSION:   Mild small vessel ischemic changes in both cerebral hemispheres with a tiny old lacunar infarct in the left occipital lobe. Tiny vascular abnormality of one of the cortical vessels in the left occipital lobe, probably a focally dilated  vein probably of no clinical significance.  Slight diffuse meningeal enhancement is nonspecific. She had a head CT without contrast on 08/19/2011. This showed a remote right caudate lacunar near the genu of the internal capsule. It also showed mild atrophy and small vessel disease. I reviewed the report. She denies any one-sided weakness, numbness or tingling, never had a TIA.She wakes up with a headache. She denies nocturia. Currently headache is mildly pounding, bilaterally in the back of [name] head. She denies one-sided eye swelling, redness, lacrimation or rhinorrhea associated with a headache. [name] primary care physician gave [name] a steroid shot and an anti-inflammatory medication via injection recently but [name] blood pressure spiked up. He also gave [name] prescription for diclofenac which she has not taken for fear of side effects. She has not been able to tolerate medications very well.  [name] Past Medical History Is Significant For: Past Medical History  Diagnosis Date  . Essential hypertension, benign   . Non-obstructive CAD     a. Reportedly nonobstructive at catheterization in the 1990s;  b. 03/2014 Cath: LM nl, LAD 30-40p, 7m D1 20ost, D2 50ost/p, D3 small, LCX nl/nondominant, OM 1/2/3 nl, RCA 20-30p, 411mPDA nl, RPL1/2  small, nl.  . Macular degeneration   . Anxiety disorder   . Diverticulosis     Left sided noted on colonoscopy 02/2006  . Chronic constipation   . Esophageal reflux disease   . Lumbar radiculopathy   . Hyperlipidemia   . Hiatal hernia   . Arthritis   . Penetrating atherosclerotic ulcer of aorta (HCC)     Complex ulcerated plaque of the infrarenal abdominal aorta mild renal artery stenosis on the right  side, normal left renal artery catheterization 2009, status post aortic stent graft  . Claudication [facility])     Chronic occlusion of the right limb of aortic stent graft.  . Trigeminal neuralgia     Right  . Type 2 diabetes mellitus ([place])   . Anemia   . Migraines   .  Renal artery stenosis ([place])     a. Right renal artery stent April 2013 - Dr. Fletcher Anon;  b. 03/2014 PTA/stenting of RRA 2/2 ISR: 6x18 Herculink stent.  . Ectopic atrial tachycardia ([place])     [name] Past Surgical History Is Significant For: Past Surgical History  Procedure Laterality Date  . Carotid endarterectomy Left 1993  . Endologix powerlink bifurcated      Covered stent graft  . Tonsillectomy and adenoidectomy  1947  . Abdominal hysterectomy  1982  . Lumbar disc surgery  1981  . Breast biopsy Left 1990's  . Abdominal aortagram N/A 02/13/2012    Procedure: ABDOMINAL Maxcine Ham;  Surgeon: Wellington Hampshire, MD;  Location: [facility] CATH LAB;  Service: Cardiovascular;  Laterality: N/A;  . Renal angiogram N/A 02/13/2012    Procedure: RENAL ANGIOGRAM;  Surgeon: Wellington Hampshire, MD;  Location: [facility] CATH LAB;  Service: Cardiovascular;  Laterality: N/A;  . Abdominal aortagram N/A 03/17/2014    Procedure: ABDOMINAL Maxcine Ham;  Surgeon: Wellington Hampshire, MD;  Location: [facility] CATH LAB;  Service: Cardiovascular;  Laterality: N/A;  . Renal angiogram N/A 03/17/2014    Procedure: RENAL ANGIOGRAM;  Surgeon: Wellington Hampshire, MD;  Location: [facility] CATH LAB;  Service: Cardiovascular;  Laterality: N/A;  . Left heart catheterization with coronary angiogram N/A 03/17/2014    Procedure: LEFT HEART CATHETERIZATION WITH CORONARY ANGIOGRAM;  Surgeon: Wellington Hampshire, MD;  Location: [facility] CATH LAB;  Service: Cardiovascular;  Laterality: N/A;  . Percutaneous stent intervention Right 03/17/2014    Procedure: PERCUTANEOUS STENT INTERVENTION;  Surgeon: Wellington Hampshire, MD;  Location: [facility] CATH LAB;  Service: Cardiovascular;  Laterality: Right;  Renal    [name] Family History Is Significant For: Family History  Problem Relation Age of Onset  . Heart failure Mother   . Coronary artery disease Mother   . Heart disease Mother   . Hypertension Mother   . Stroke Mother   . Heart attack Father   . Heart disease Father   . Hypertension Father   .  Heart disease Brother   . Hypertension Sister   . Parkinson's disease Sister   . Hypertension Brother   . Stroke Sister   . Hypertension Sister   . Kidney disease Sister   . Stroke Sister     [name] Social History Is Significant For: Social History   Social History  . Marital Status: Widowed    Spouse Name: N/A  . Number of Children: 1  . Years of Education: 12   Occupational History  . Retired     Scientist, clinical (histocompatibility and immunogenetics) at [employer] History Main Topics  . Smoking status: Former Smoker -- 0.20 packs/day for 1 years    Types: Cigarettes    Quit date: 11/13/1979  . Smokeless tobacco: Never Used  . Alcohol Use: No  . Drug Use: No  . Sexual Activity: No   Other Topics Concern  . None   Social History Narrative   Drinks 1/2 caf coffee 3-4 cups a day     [name] Allergies Are:  Allergies  Allergen Reactions  . Cefixime Shortness Of Breath    Patient is not familiar with  this listed allergy  . Conjugated Estrogens Other (See Comments)    REACTION: flushing  . Morphine And Related Other (See Comments)    Morphine injection Neck pain  . Codeine Other (See Comments)    REACTION: dizziness  . Iohexol      Desc: pt has anxiety and an irregular heartbeat. contrast exacerbates this. pt was very uncomfortable after a 20cc bolus for an miroi. we did w/o iv 11/09 per dr. Kris Hartmann. pt will talk w/ md about this and will probably not get iv contrast in the future at [name] re, Onset Date: [id number]   . Adenosine Other (See Comments)    Elevated heart rate when doing stress test  :   [name] Current Medications Are:  Outpatient Encounter Prescriptions as of 02/15/2016  Medication Sig  . acetaminophen (TYLENOL) 325 MG tablet Take 325 mg by mouth every 6 (six) hours as needed. For pain.  Marland Kitchen amLODipine (NORVASC) 2.5 MG tablet TAKE 1 TABLET DAILY  . aspirin EC 81 MG tablet Take 81 mg by mouth at bedtime.   . Cholecalciferol (VITAMIN D3) 1000 UNITS CAPS Take 1,000 capsules by mouth daily.  . clonazePAM  (KLONOPIN) 0.5 MG tablet Take 0.125 mg by mouth as needed for anxiety.   Marland Kitchen labetalol (NORMODYNE) 200 MG tablet Take 1/2 tab (181m) by mouth every morning & 1 tab (204m every evening  . Multiple Vitamins-Minerals (PRESERVISION/LUTEIN) CAPS Take 1 capsule by mouth 2 (two) times daily.   . polyethylene glycol (MIRALAX / GLYCOLAX) packet Take 17 g by mouth every morning. Constipation  . rosuvastatin (CRESTOR) 5 MG tablet Take 1 tablet (5 mg total) by mouth daily.  . cloNIDine (CATAPRES) 0.1 MG tablet Take by mouth. Reported on 02/15/2016   No facility-administered encounter medications on file as of 02/15/2016.  :   Review of Systems:  Out of a complete 14 point review of systems, all are reviewed and negative with the exception of these symptoms as listed below:  Review of Systems  Neurological: Positive for dizziness, weakness and headaches.       Patient states that she has had headaches for about 10 years. Reports that in the last year they have become more intense. States that [name] dizziness has become worse in the last year.   Psychiatric/Behavioral:       Anxiety     Objective:  Neurologic Exam  Physical Exam Physical Examination:   Filed Vitals:   02/15/16 1309 02/15/16 1315  BP: 192/110 180/102  Pulse: 82 84  Resp: 16 16   General Examination: The patient is a very pleasant [age].o. female in no acute distress. She appears well-developed and well-nourished and well groomed. She is mildly anxious appearing. She does not appear to be photophobic.  HEENT: Normocephalic, atraumatic, pupils are equal, round and reactive to light and accommodation. Funduscopic exam is normal with sharp disc margins noted. She is status post bilateral cataract repairs. Extraocular tracking is good without limitation to gaze excursion or nystagmus noted. Normal smooth pursuit is noted. Hearing is grossly intact. She is not tender to palpation and in temporal areas. Face is symmetric with normal facial  animation and normal facial sensation. Speech is clear with no dysarthria noted. There is no hypophonia. There is no lip, neck/head, jaw or voice tremor. Neck is supple with full range of passive and active motion. There are no carotid bruits on auscultation. Oropharynx exam reveals: mild mouth dryness, adequate dental hygiene and mild airway crowding, due to smaller airway  entry and redundant soft palate. Tonsils are absent. Mallampati is class I. Neck circumference is 14-3/4 inches.  Chest: Clear to auscultation without wheezing, rhonchi or crackles noted.occasional extra heartbeat noted.  Heart: S1+S2+0, regular and normal without murmurs, rubs or gallops noted.   Abdomen: Soft, non-tender and non-distended with normal bowel sounds appreciated on auscultation.  Extremities: There is no pitting edema in the distal lower extremities bilaterally. Pedal pulses are intact.  Skin: Warm and dry without trophic changes noted. There are no varicose veins, but she has bilateral veins.  Musculoskeletal: exam reveals no obvious joint deformities, tenderness or joint swelling or erythema, left distal leg slightly larger in caliber than right.   Neurologically:  Mental status: The patient is awake, alert and oriented in all 4 spheres. [name] immediate and remote memory, attention, language skills and fund of knowledge are appropriate. There is no evidence of aphasia, agnosia, apraxia or anomia. Speech is clear with normal prosody and enunciation. Thought process is linear. Mood is normal and affect is normal.  Cranial nerves II - XII are as described above under HEENT exam. In addition: shoulder shrug is normal with equal shoulder height noted. Motor exam: Normal bulk, strength and tone is noted. There is no drift, tremor or rebound. Romberg is negative. Reflexes are 2+ throughout. Babinski: Toes are flexor bilaterally. Fine motor skills and coordination: intact with normal finger taps, normal hand movements,  normal rapid alternating patting, normal foot taps and normal foot agility.  Cerebellar testing: No dysmetria or intention tremor on finger to nose testing. Heel to shin is unremarkable bilaterally. There is no truncal or gait ataxia.  Sensory exam: intact to light touch, pinprick, vibration, temperature sense in the upper and lower extremities.  Gait, station and balance: She stands easily. No veering to one side is noted. No leaning to one side is noted. Posture is age-appropriate and stance is narrow based. Gait shows normal stride length and normal pace. No problems turning are noted. She turns en bloc. Tandem walk is unremarkable.  Assessment and Plan:  In summary, [name] is a very pleasant 80 y.o.-year old female with an underlying complex medical history of coronary artery disease, ectopic atrial tachycardia, hypertension, hyperlipidemia, penetrating aortic ulcer, renal artery stenosis, macular degeneration, anxiety, diverticulosis, reflux disease, lumbar radiculopathy, arthritis, status post multiple surgeries including left carotid endarterectomy, tonsillectomy, adenoidectomy, abdominal hysterectomy, lumbar spine surgery, coronary stent placement, who reports a long-standing history of recurrent headaches for the past 10 years, in the past few months this has become worse.  She does not give a telltale history of cluster headaches. She does not have a history and exam telltale for temporal arteritis. History is also not telltale for migraines as the location changes and she does not typically have nausea or vomiting but she does have occasional photophobia. She does not have a strong family history of migraines but sister had it. [name] blood pressure has been high. She may have blood pressure related headaches. She has a nonfocal exam but has vascular risk factors and has had lacunar strokes. We talked about [name] previous head CT and brain MRI. I would like to proceed with a brain MRI with and  without contrast, blood test in the form of ESR and CRP, and sleep study testing. We may consider gabapentin for prevention, she does not have very good track record of tolerating medication very well. She is reluctant to take anything that would spike of blood pressure. She is advised that anti-inflammatory medications  can occasionally increase blood pressure. [name] blood pressure is high today. We will call [name] with [name] blood test results and MRI results. I will consider low-dose gabapentin starting with 100 mg daily, with gradual titration. I talked briefly about potential side effects of gabapentin. We talked about headache prevention, stroke prevention, headache acute management as well.Of note, she could not tolerate amitriptyline. I advised the patient about common headache triggers: sleep deprivation, dehydration, overheating, stress, hypoglycemia or skipping meals and blood sugar fluctuations, excessive pain medications or excessive alcohol use or caffeine withdrawal. Some people have food triggers such as aged cheese, orange juice or chocolate, especially dark chocolate, or MSG (monosodium glutamate). She is to try to avoid these headache triggers as much possible. It may be helpful to keep a headache diary to figure out what makes [name] headaches worse or brings them on and what alleviates them. Some people report headache onset after exercise but studies have shown that regular exercise may actually prevent headaches from coming. If She has exercise-induced headaches, She is advised to drink plenty of fluid before and after exercising and that to not overdo it and to not overheat.  As far as further diagnostic testing is concerned, I suggested the following today: MRI brain w and w/o Gad. Sleep study.  As far as medications are concerned, I recommended the following at this time: will consider gabapentin.  I will see [name] back after these tests are completed. I answered all [name] questions today and the  patient was in agreement with the above outlined plan. Thank you very much for allowing me to participate in the care of this nice patient. If I can be of any further assistance to you please do not hesitate to call me at [phone].  Sincerely,   Star Age, MD, PhD

## 2016-02-15 NOTE — Patient Instructions (Addendum)
Based on your symptoms and your exam I believe you may be at risk for obstructive sleep apnea or OSA, and I think we should proceed with a sleep study to determine whether you do or do not have OSA and how severe it is. If you have more than mild OSA, I want you to consider treatment with CPAP. Please remember, the risks and ramifications of moderate to severe obstructive sleep apnea or OSA are: Cardiovascular disease, including congestive heart failure, stroke, difficult to control hypertension, arrhythmias, and even type 2 diabetes has been linked to untreated OSA. Sleep apnea causes disruption of sleep and sleep deprivation in most cases, which, in turn, can cause recurrent headaches, problems with memory, mood, concentration, focus, and vigilance. Most people with untreated sleep apnea report excessive daytime sleepiness, which can affect their ability to drive. Please do not drive if you feel sleepy.   I will likely see you back after your sleep study to go over the test results and where to go from there. We will call you after your sleep study to advise about the results (most likely, you will hear from Beverlee Nims, my nurse) and to set up an appointment at the time, as necessary.    Our sleep lab administrative assistant, Arrie Aran will meet with you or call you to schedule your sleep study. If you don't hear back from her by next week please feel free to call her at [phone]. This is her direct line and please leave a message with your phone number to call back if you get the voicemail box. She will call back as soon as possible.   We will do a brain scan, called MRI and call you with the test results. We will have to schedule you for this on a separate date. This test requires authorization from your insurance, and we will take care of the insurance process.  We will check blood work today and call you with the test results.  We will consider medication for headache, as discussed, will try gabapentin.    Please remember, common headache triggers are: sleep deprivation, dehydration, overheating, stress, hypoglycemia or skipping meals and blood sugar fluctuations, excessive pain medications or excessive alcohol use or caffeine withdrawal. Some people have food triggers such as aged cheese, orange juice or chocolate, especially dark chocolate, or MSG (monosodium glutamate). Try to avoid these headache triggers as much possible. It may be helpful to keep a headache diary to figure out what makes your headaches worse or brings them on and what alleviates them. Some people report headache onset after exercise but studies have shown that regular exercise may actually prevent headaches from coming. If you have exercise-induced headaches, please make sure that you drink plenty of fluid before and after exercising and that you do not over do it and do not overheat.

## 2016-02-16 ENCOUNTER — Telehealth: Payer: Self-pay

## 2016-02-16 LAB — C-REACTIVE PROTEIN: CRP: 0.4 mg/L (ref 0.0–4.9)

## 2016-02-16 LAB — SEDIMENTATION RATE: Sed Rate: 2 mm/hr (ref 0–40)

## 2016-02-16 NOTE — Progress Notes (Signed)
Quick Note:  Labs for inflammatory markers were normal from yesterday. Please call patient to notify.  Star Age, MD, PhD [facility] ([facility])  ______

## 2016-02-16 NOTE — Telephone Encounter (Signed)
I spoke to Elody and she is aware of results.

## 2016-02-16 NOTE — Telephone Encounter (Signed)
-----   Message from Star Age, MD sent at 02/16/2016 10:32 AM EDT ----- Labs for inflammatory markers were normal from yesterday. Please call patient to notify.  Star Age, MD, PhD [facility])

## 2016-02-20 ENCOUNTER — Telehealth: Payer: Self-pay | Admitting: *Deleted

## 2016-02-20 DIAGNOSIS — E1165 Type 2 diabetes mellitus with hyperglycemia: Secondary | ICD-10-CM | POA: Diagnosis not present

## 2016-02-20 DIAGNOSIS — R51 Headache: Secondary | ICD-10-CM | POA: Diagnosis not present

## 2016-02-20 NOTE — Telephone Encounter (Signed)
Patient is requesting an appointment for weak spells, & chronic headaches. Patient said that she is scheduled for a MRI at the end of the month but felt that Dr. Domenic Polite would be able to help her before then. No c/o chest pain, dizziness or sob. Patient advised that she needed to contact her PCP or her neurologists for these symptoms and if they felt that her problem was cardiac related, then we could see her for an appointment. Patient verbalized understanding of plan.

## 2016-02-22 ENCOUNTER — Other Ambulatory Visit: Payer: Self-pay | Admitting: Cardiology

## 2016-02-22 DIAGNOSIS — E119 Type 2 diabetes mellitus without complications: Secondary | ICD-10-CM | POA: Diagnosis not present

## 2016-02-22 DIAGNOSIS — H43811 Vitreous degeneration, right eye: Secondary | ICD-10-CM | POA: Diagnosis not present

## 2016-02-22 DIAGNOSIS — H353132 Nonexudative age-related macular degeneration, bilateral, intermediate dry stage: Secondary | ICD-10-CM | POA: Diagnosis not present

## 2016-02-22 DIAGNOSIS — H3554 Dystrophies primarily involving the retinal pigment epithelium: Secondary | ICD-10-CM | POA: Diagnosis not present

## 2016-02-22 DIAGNOSIS — H3570 Unspecified separation of retinal layers: Secondary | ICD-10-CM | POA: Diagnosis not present

## 2016-02-24 ENCOUNTER — Telehealth: Payer: Self-pay | Admitting: *Deleted

## 2016-02-24 DIAGNOSIS — E785 Hyperlipidemia, unspecified: Secondary | ICD-10-CM | POA: Diagnosis present

## 2016-02-24 DIAGNOSIS — F419 Anxiety disorder, unspecified: Secondary | ICD-10-CM | POA: Diagnosis not present

## 2016-02-24 DIAGNOSIS — Z886 Allergy status to analgesic agent status: Secondary | ICD-10-CM | POA: Diagnosis not present

## 2016-02-24 DIAGNOSIS — I674 Hypertensive encephalopathy: Secondary | ICD-10-CM | POA: Diagnosis not present

## 2016-02-24 DIAGNOSIS — Z888 Allergy status to other drugs, medicaments and biological substances status: Secondary | ICD-10-CM | POA: Diagnosis not present

## 2016-02-24 DIAGNOSIS — I2510 Atherosclerotic heart disease of native coronary artery without angina pectoris: Secondary | ICD-10-CM | POA: Diagnosis present

## 2016-02-24 DIAGNOSIS — I160 Hypertensive urgency: Secondary | ICD-10-CM | POA: Diagnosis not present

## 2016-02-24 DIAGNOSIS — E7800 Pure hypercholesterolemia, unspecified: Secondary | ICD-10-CM | POA: Diagnosis not present

## 2016-02-24 DIAGNOSIS — K219 Gastro-esophageal reflux disease without esophagitis: Secondary | ICD-10-CM | POA: Diagnosis present

## 2016-02-24 DIAGNOSIS — R51 Headache: Secondary | ICD-10-CM | POA: Diagnosis not present

## 2016-02-24 DIAGNOSIS — R030 Elevated blood-pressure reading, without diagnosis of hypertension: Secondary | ICD-10-CM | POA: Diagnosis not present

## 2016-02-24 DIAGNOSIS — Z955 Presence of coronary angioplasty implant and graft: Secondary | ICD-10-CM | POA: Diagnosis not present

## 2016-02-24 DIAGNOSIS — R531 Weakness: Secondary | ICD-10-CM | POA: Diagnosis not present

## 2016-02-24 DIAGNOSIS — Z833 Family history of diabetes mellitus: Secondary | ICD-10-CM | POA: Diagnosis not present

## 2016-02-24 DIAGNOSIS — Z79899 Other long term (current) drug therapy: Secondary | ICD-10-CM | POA: Diagnosis not present

## 2016-02-24 DIAGNOSIS — Z881 Allergy status to other antibiotic agents status: Secondary | ICD-10-CM | POA: Diagnosis not present

## 2016-02-24 DIAGNOSIS — Z91041 Radiographic dye allergy status: Secondary | ICD-10-CM | POA: Diagnosis not present

## 2016-02-24 DIAGNOSIS — I10 Essential (primary) hypertension: Secondary | ICD-10-CM | POA: Diagnosis not present

## 2016-02-24 DIAGNOSIS — E119 Type 2 diabetes mellitus without complications: Secondary | ICD-10-CM | POA: Diagnosis present

## 2016-02-24 NOTE — Telephone Encounter (Signed)
Pt called c/o SOB/dizzniess/fast heart beat/extreme headache/tingling in L hand fingers, audibly sob but alert and denied CP. Pt says she had called her daughter who was on the way, called EMS on pt behalf, pt was appreciative and awaiting daughter and EMS arrival.

## 2016-02-28 DIAGNOSIS — I10 Essential (primary) hypertension: Secondary | ICD-10-CM | POA: Diagnosis not present

## 2016-02-28 DIAGNOSIS — R42 Dizziness and giddiness: Secondary | ICD-10-CM | POA: Diagnosis not present

## 2016-02-28 DIAGNOSIS — Z299 Encounter for prophylactic measures, unspecified: Secondary | ICD-10-CM | POA: Diagnosis not present

## 2016-02-28 DIAGNOSIS — Z09 Encounter for follow-up examination after completed treatment for conditions other than malignant neoplasm: Secondary | ICD-10-CM | POA: Diagnosis not present

## 2016-02-29 ENCOUNTER — Emergency Department (HOSPITAL_COMMUNITY): Payer: Medicare Other

## 2016-02-29 ENCOUNTER — Emergency Department (HOSPITAL_COMMUNITY)
Admission: EM | Admit: 2016-02-29 | Discharge: 2016-02-29 | Disposition: A | Discharge status: Home / Self Care | Payer: Medicare Other | Attending: Emergency Medicine | Admitting: Emergency Medicine

## 2016-02-29 ENCOUNTER — Encounter (HOSPITAL_COMMUNITY): Payer: Self-pay

## 2016-02-29 DIAGNOSIS — R5383 Other fatigue: Secondary | ICD-10-CM | POA: Diagnosis present

## 2016-02-29 DIAGNOSIS — M1990 Unspecified osteoarthritis, unspecified site: Secondary | ICD-10-CM | POA: Insufficient documentation

## 2016-02-29 DIAGNOSIS — E119 Type 2 diabetes mellitus without complications: Secondary | ICD-10-CM | POA: Diagnosis not present

## 2016-02-29 DIAGNOSIS — I160 Hypertensive urgency: Secondary | ICD-10-CM | POA: Diagnosis not present

## 2016-02-29 DIAGNOSIS — Z79899 Other long term (current) drug therapy: Secondary | ICD-10-CM | POA: Diagnosis not present

## 2016-02-29 DIAGNOSIS — E785 Hyperlipidemia, unspecified: Secondary | ICD-10-CM | POA: Diagnosis not present

## 2016-02-29 DIAGNOSIS — Z87891 Personal history of nicotine dependence: Secondary | ICD-10-CM | POA: Insufficient documentation

## 2016-02-29 DIAGNOSIS — Z7982 Long term (current) use of aspirin: Secondary | ICD-10-CM | POA: Insufficient documentation

## 2016-02-29 DIAGNOSIS — R51 Headache: Secondary | ICD-10-CM | POA: Diagnosis not present

## 2016-02-29 LAB — CBC WITH DIFFERENTIAL/PLATELET
BASOS ABS: 0 10*3/uL (ref 0.0–0.1)
Basophils Relative: 0 %
EOS ABS: 0.1 10*3/uL (ref 0.0–0.7)
EOS PCT: 2 %
HCT: 37.1 % (ref 36.0–46.0)
Hemoglobin: 12.7 g/dL (ref 12.0–15.0)
Lymphocytes Relative: 25 %
Lymphs Abs: 1.2 10*3/uL (ref 0.7–4.0)
MCH: 29.5 pg (ref 26.0–34.0)
MCHC: 34.2 g/dL (ref 30.0–36.0)
MCV: 86.1 fL (ref 78.0–100.0)
MONO ABS: 0.2 10*3/uL (ref 0.1–1.0)
Monocytes Relative: 5 %
Neutro Abs: 3.3 10*3/uL (ref 1.7–7.7)
Neutrophils Relative %: 68 %
PLATELETS: 179 10*3/uL (ref 150–400)
RBC: 4.31 MIL/uL (ref 3.87–5.11)
RDW: 13.2 % (ref 11.5–15.5)
WBC: 4.8 10*3/uL (ref 4.0–10.5)

## 2016-02-29 LAB — BASIC METABOLIC PANEL
ANION GAP: 8 (ref 5–15)
BUN: 17 mg/dL (ref 6–20)
CALCIUM: 10.3 mg/dL (ref 8.9–10.3)
CO2: 26 mmol/L (ref 22–32)
Chloride: 102 mmol/L (ref 101–111)
Creatinine, Ser: 0.59 mg/dL (ref 0.44–1.00)
GFR calc Af Amer: >60 mL/min (ref >60)
GLUCOSE: 120 mg/dL — AB (ref 65–99)
POTASSIUM: 3.9 mmol/L (ref 3.5–5.1)
SODIUM: 136 mmol/L (ref 135–145)

## 2016-02-29 LAB — URINE MICROSCOPIC-ADD ON: RBC / HPF: NONE SEEN RBC/hpf (ref 0–5)

## 2016-02-29 LAB — TROPONIN I

## 2016-02-29 LAB — URINALYSIS, ROUTINE W REFLEX MICROSCOPIC
BILIRUBIN URINE: NEGATIVE
GLUCOSE, UA: NEGATIVE mg/dL
Hgb urine dipstick: NEGATIVE
KETONES UR: NEGATIVE mg/dL
Nitrite: NEGATIVE
PH: 6 (ref 5.0–8.0)
PROTEIN: NEGATIVE mg/dL
Specific Gravity, Urine: 1.01 (ref 1.005–1.030)

## 2016-02-29 MED ORDER — AMLODIPINE BESYLATE 5 MG PO TABS: 5.0000 mg | ORAL_TABLET | Freq: Every day | ORAL | Status: DC

## 2016-02-29 MED ORDER — AMLODIPINE BESYLATE 5 MG PO TABS: 5.0000 mg | ORAL_TABLET | Freq: Once | ORAL | Status: DC

## 2016-02-29 NOTE — ED Notes (Signed)
Pt reports has been having headaches that get better but not completely go away for the past month or so.  Pt also says for the past month has been having episodes of generalized weakness and high bp.  Reports this afternoon her bp was 233/110.  Pt says took a clonidine and clonazepam this afternoon when her bp was elevated.Pt says at this time she feels better.

## 2016-02-29 NOTE — ED Provider Notes (Signed)
CSN: [id number]     Arrival date & time 02/29/16  1556 History   First MD Initiated Contact with Patient 02/29/16 1650     Chief Complaint  Patient presents with  . Fatigue      HPI  Patient is a poor historian. It seems as though the patient primarily concerned about headache, fatigue, hypertension. Seems as though the past month or so the patient has had episodes of headache, fatigue almost every day. Symptoms seem to occur in the afternoon, without clear precipitant. Symptoms seem to improve after taking a dose of clonidine. Today, the patient a similar event, with generalized headache, fatigue, without nausea, vomiting, chest pain, dyspnea. She reports that her blood pressure was notably elevated, 230/110. Patient has been seen by other emergency department during this illness, has not yet seen her primary care physician. Patient states that she take all medication as directed.   Subsequent, the patient's daughter arrives, seems as though the patient does not take all medication as directed.   Past Medical History  Diagnosis Date  . Essential hypertension, benign   . Non-obstructive CAD     a. Reportedly nonobstructive at catheterization in the 1990s;  b. 03/2014 Cath: LM nl, LAD 30-40p, 39m, D1 20ost, D2 50ost/p, D3 small, LCX nl/nondominant, OM 1/2/3 nl, RCA 20-30p, 23m, PDA nl, RPL1/2  small, nl.  . Macular degeneration   . Anxiety disorder   . Diverticulosis     Left sided noted on colonoscopy 02/2006  . Chronic constipation   . Esophageal reflux disease   . Lumbar radiculopathy   . Hyperlipidemia   . Hiatal hernia   . Arthritis   . Penetrating atherosclerotic ulcer of aorta (HCC)     Complex ulcerated plaque of the infrarenal abdominal aorta mild renal artery stenosis on the right side, normal left renal artery catheterization 2009, status post aortic stent graft  . Claudication [facility])     Chronic occlusion of the right limb of aortic stent graft.  . Trigeminal  neuralgia     Right  . Type 2 diabetes mellitus ([place])   . Anemia   . Migraines   . Renal artery stenosis ([place])     a. Right renal artery stent April 2013 - Dr. Fletcher Anon;  b. 03/2014 PTA/stenting of RRA 2/2 ISR: 6x18 Herculink stent.  . Ectopic atrial tachycardia [facility])    Past Surgical History  Procedure Laterality Date  . Carotid endarterectomy Left 1993  . Endologix powerlink bifurcated      Covered stent graft  . Tonsillectomy and adenoidectomy  1947  . Abdominal hysterectomy  1982  . Lumbar disc surgery  1981  . Breast biopsy Left 1990's  . Abdominal aortagram N/A 02/13/2012    Procedure: ABDOMINAL Maxcine Ham;  Surgeon: Wellington Hampshire, MD;  Location: [facility] CATH LAB;  Service: Cardiovascular;  Laterality: N/A;  . Renal angiogram N/A 02/13/2012    Procedure: RENAL ANGIOGRAM;  Surgeon: Wellington Hampshire, MD;  Location: [place] CATH LAB;  Service: Cardiovascular;  Laterality: N/A;  . Abdominal aortagram N/A 03/17/2014    Procedure: ABDOMINAL Maxcine Ham;  Surgeon: Wellington Hampshire, MD;  Location: [place] CATH LAB;  Service: Cardiovascular;  Laterality: N/A;  . Renal angiogram N/A 03/17/2014    Procedure: RENAL ANGIOGRAM;  Surgeon: Wellington Hampshire, MD;  Location: [place] CATH LAB;  Service: Cardiovascular;  Laterality: N/A;  . Left heart catheterization with coronary angiogram N/A 03/17/2014    Procedure: LEFT HEART CATHETERIZATION WITH CORONARY ANGIOGRAM;  Surgeon: Wellington Hampshire,  MD;  Location: [place] CATH LAB;  Service: Cardiovascular;  Laterality: N/A;  . Percutaneous stent intervention Right 03/17/2014    Procedure: PERCUTANEOUS STENT INTERVENTION;  Surgeon: Wellington Hampshire, MD;  Location: [place] CATH LAB;  Service: Cardiovascular;  Laterality: Right;  Renal   Family History  Problem Relation Age of Onset  . Heart failure Mother   . Coronary artery disease Mother   . Heart disease Mother   . Hypertension Mother   . Stroke Mother   . Heart attack Father   . Heart disease Father   . Hypertension Father   . Heart  disease Brother   . Hypertension Sister   . Parkinson's disease Sister   . Hypertension Brother   . Stroke Sister   . Hypertension Sister   . Kidney disease Sister   . Stroke Sister    Social History  Substance Use Topics  . Smoking status: Former Smoker -- 0.20 packs/day for 1 years    Types: Cigarettes    Quit date: 11/13/1979  . Smokeless tobacco: Never Used  . Alcohol Use: No   OB History    No data available     Review of Systems  Constitutional:       Per HPI, otherwise negative  HENT:       Per HPI, otherwise negative  Respiratory:       Per HPI, otherwise negative  Cardiovascular:       Per HPI, otherwise negative  Gastrointestinal: Negative for vomiting.  Endocrine:       Negative aside from HPI  Genitourinary:       Neg aside from HPI   Musculoskeletal:       Per HPI, otherwise negative  Skin: Negative.   Neurological: Positive for weakness and headaches. Negative for syncope.  Psychiatric/Behavioral: The patient is nervous/anxious.       Allergies  Cefixime; Conjugated estrogens; Morphine and related; Codeine; Iohexol; and Adenosine  Home Medications   Prior to Admission medications   Medication Sig Start Date End Date Taking? Authorizing Provider  aspirin EC 81 MG tablet Take 81 mg by mouth at bedtime.    Yes Historical Provider, MD  Cholecalciferol (VITAMIN D3) 1000 UNITS CAPS Take 1,000 capsules by mouth daily.   Yes Historical Provider, MD  cloNIDine (CATAPRES) 0.1 MG tablet Take 0.1 mg by mouth 2 (two) times daily as needed (SBP>170). Reported on 02/15/2016   Yes Historical Provider, MD  labetalol (NORMODYNE) 200 MG tablet Take 1/2 tab (100mg ) by mouth every morning & 1 tab (200mg ) every evening 10/30/12  Yes Wellington Hampshire, MD  Multiple Vitamins-Minerals (PRESERVISION/LUTEIN) CAPS Take 1 capsule by mouth 2 (two) times daily.    Yes Historical Provider, MD  rosuvastatin (CRESTOR) 5 MG tablet TAKE 1 TABLET DAILY 02/22/16  Yes [name], MD   acetaminophen (TYLENOL) 325 MG tablet Take 325 mg by mouth every 6 (six) hours as needed. For pain.    Historical Provider, MD  amLODipine (NORVASC) 5 MG tablet Take 1 tablet (5 mg total) by mouth daily. 02/29/16   Carmin Muskrat, MD  clonazePAM (KLONOPIN) 0.5 MG tablet Take 0.125 mg by mouth at bedtime as needed for anxiety.     Historical Provider, MD  polyethylene glycol (MIRALAX / GLYCOLAX) packet Take 17 g by mouth daily as needed for mild constipation. Constipation    Historical Provider, MD   BP 148/79 mmHg  Pulse 60  Temp(Src) 98.8 F (37.1 C) (Oral)  Resp 18  Ht 5'  2" (1.575 m)  Wt 118 lb (53.524 kg)  BMI 21.58 kg/m2  SpO2 97% Physical Exam  Constitutional: She is oriented to person, place, and time. She appears well-developed and well-nourished. No distress.  HENT:  Head: Normocephalic and atraumatic.  Eyes: Conjunctivae and EOM are normal.  Cardiovascular: Normal rate and regular rhythm.   Pulmonary/Chest: Effort normal and breath sounds normal. No stridor. No respiratory distress.  Abdominal: She exhibits no distension.  Musculoskeletal: She exhibits no edema.  Neurological: She is alert and oriented to person, place, and time. She displays no atrophy and no tremor. No cranial nerve deficit. She exhibits normal muscle tone. She displays no seizure activity. Coordination normal.  Skin: Skin is warm and dry.  Psychiatric: She has a normal mood and affect.  Nursing note and vitals reviewed.   ED Course  Procedures (including critical care time) Labs Review Labs Reviewed  BASIC METABOLIC PANEL - Abnormal; Notable for the following:    Glucose, Bld 120 (*)    All other components within normal limits  URINALYSIS, ROUTINE W REFLEX MICROSCOPIC (NOT AT [facility]) - Abnormal; Notable for the following:    Leukocytes, UA TRACE (*)    All other components within normal limits  URINE MICROSCOPIC-ADD ON - Abnormal; Notable for the following:    Squamous Epithelial / LPF 0-5 (*)     Bacteria, UA RARE (*)    All other components within normal limits  CBC WITH DIFFERENTIAL/PLATELET  TROPONIN I    Imaging Review Dg Chest 2 View  02/29/2016  CLINICAL DATA:  80 year old female with history of weakness for the past few weeks headache for 1 month. EXAM: CHEST  2 VIEW COMPARISON:  Chest x-ray 02/24/2016. FINDINGS: Lung volumes are normal. No consolidative airspace disease. No pleural effusions. No pneumothorax. No pulmonary nodule or mass noted. Pulmonary vasculature and the cardiomediastinal silhouette are within normal limits. Atherosclerosis in the thoracic aorta. IMPRESSION: 1.  No radiographic evidence of acute cardiopulmonary disease. 2. Atherosclerosis. Electronically Signed   By: Vinnie Langton M.D.   On: 02/29/2016 18:34   I have personally reviewed and evaluated these images and lab results as part of my medical decision-making.   EKG Interpretation   Date/Time:  Wednesday February 29 2016 16:10:16 EDT Ventricular Rate:  74 PR Interval:  162 QRS Duration: 90 QT Interval:  380 QTC Calculation: 421 R Axis:   -15 Text Interpretation:  Normal sinus rhythm Possible Left atrial enlargement  Nonspecific ST abnormality Abnormal ECG Sinus rhythm ST-t wave abnormality  No significant change since last tracing Abnormal ekg Confirmed by  Carmin Muskrat  MD [phone]) on 02/29/2016 4:51:17 PM     On repeat exam the patient is in no distress. I discussed all findings with patient and her daughter.  Patient's blood pressure is 148/79, decreased appropriately since arrival (provisional of additional dose of home medication.  MDM   Final diagnoses:  Hypertensive urgency   Well-appearing female presents with concerns of episodic headache, fatigue, hypertension. Here the patient is awake and alert, neurologically intact. Symptoms maybe secondary to hypertensive urgency, but there is no evidence for end organ effects. Patient's blood pressure diminished properly, and she  was discharged in stable condition with a new blood pressure medication regimen.   Carmin Muskrat, MD 02/29/16 1929

## 2016-02-29 NOTE — Discharge Instructions (Signed)
As discussed, today's evaluation has been largely reassuring. Please take your adjusted medication regimen as directed.  Though today's studies were reassuring, it is important that he follow up with your primary care physician for additional evaluation of your hypertension, headaches, fatigue.  Please discuss further evaluation of your stents as well.  Return here for concerning changes in her condition.

## 2016-03-07 ENCOUNTER — Ambulatory Visit (INDEPENDENT_AMBULATORY_CARE_PROVIDER_SITE_OTHER): Payer: Medicare Other | Admitting: Cardiology

## 2016-03-07 ENCOUNTER — Encounter: Payer: Self-pay | Admitting: Cardiology

## 2016-03-07 VITALS — BP 213/102 | HR 106 | Ht 62.0 in | Wt 117.8 lb

## 2016-03-07 DIAGNOSIS — I701 Atherosclerosis of renal artery: Secondary | ICD-10-CM | POA: Diagnosis not present

## 2016-03-07 DIAGNOSIS — I10 Essential (primary) hypertension: Secondary | ICD-10-CM

## 2016-03-07 DIAGNOSIS — I471 Supraventricular tachycardia: Secondary | ICD-10-CM

## 2016-03-07 DIAGNOSIS — I639 Cerebral infarction, unspecified: Secondary | ICD-10-CM | POA: Diagnosis not present

## 2016-03-07 MED ORDER — LABETALOL HCL 200 MG PO TABS: 200.0000 mg | ORAL_TABLET | Freq: Two times a day (BID) | ORAL | Status: DC

## 2016-03-07 NOTE — Progress Notes (Signed)
Cardiology Office Note  Date: 03/07/2016   ID: RAZIYA LICHT, DOB 1936/02/10, MRN [id number]  PCP: [name] Chroman, MD  Primary Cardiologist: [name] Lesches, MD   Chief Complaint  Patient presents with  . Coronary Artery Disease  . Hypertension  . Atrial tachycardia    History of Present Illness: [name] is a 80 y.o. female seen most recently by [name] in March. At that time she was continued on Labetalol and Norvasc, also started on clonidine due to elevated blood pressures. She has also had complaints of recurrent headache based on chart review and was referred to neurology for further evaluation. She was seen recently by Dr. Rexene [name] - I reviewed the note. Subsequent chart review finds ER visit just recently with continued complaints of headaches, also uncontrolled blood pressure.  She comes in today for a follow-up visit. i reviewed her home blood pressure checks which actually look fairly well controlled with the exception of one reading around 200/90. She states that on the way here to the office she started feeling bad, dizzy and weak, her blood pressure was significantly elevated per nursing check. After discussion in the room and my recheck her heart rate and blood pressure had come down somewhat. Blood pressure was 182/92 when I checked it.  We discussed her medications in detail. She states that she is taking labetalol and Norvasc as prescribed. Has used clonidine only a few times. Discussed increasing the dose of her medications, she has been hesitant concerned about side effects and also feels like her medication sometimes make her feel bad.  She does have a history of artery stenosis status post right renal artery stent placement in 2013. She underwent repeat intervention due to in stent restenosis in 2015.  Past Medical History  Diagnosis Date  . Essential hypertension, benign   . Non-obstructive CAD     a. Reportedly nonobstructive at catheterization in the  1990s;  b. 03/2014 Cath: LM nl, LAD 30-40p, 61m, D1 20ost, D2 50ost/p, D3 small, LCX nl/nondominant, OM 1/2/3 nl, RCA 20-30p, 69m, PDA nl, RPL1/2  small, nl.  . Macular degeneration   . Anxiety disorder   . Diverticulosis     Left sided noted on colonoscopy 02/2006  . Chronic constipation   . Esophageal reflux disease   . Lumbar radiculopathy   . Hyperlipidemia   . Hiatal hernia   . Arthritis   . Penetrating atherosclerotic ulcer of aorta (HCC)     Complex ulcerated plaque of the infrarenal abdominal aorta mild renal artery stenosis on the right side, normal left renal artery catheterization 2009, status post aortic stent graft  . Claudication [facility])     Chronic occlusion of the right limb of aortic stent graft.  . Trigeminal neuralgia     Right  . Type 2 diabetes mellitus ([name])   . Anemia   . Migraines   . Renal artery stenosis ([place])     a. Right renal artery stent April 2013 - [name];  b. 03/2014 PTA/stenting of RRA 2/2 ISR: 6x18 Herculink stent.  . Ectopic atrial tachycardia [facility])     Past Surgical History  Procedure Laterality Date  . Carotid endarterectomy Left 1993  . Endologix powerlink bifurcated      Covered stent graft  . Tonsillectomy and adenoidectomy  1947  . Abdominal hysterectomy  1982  . Lumbar disc surgery  1981  . Breast biopsy Left 1990's  . Abdominal aortagram N/A 02/13/2012    Procedure:  ABDOMINAL AORTAGRAM;  Surgeon: [name] Hampshire, MD;  Location: [facility] CATH LAB;  Service: Cardiovascular;  Laterality: N/A;  . Renal angiogram N/A 02/13/2012    Procedure: RENAL ANGIOGRAM;  Surgeon: [name] Hampshire, MD;  Location: [place] CATH LAB;  Service: Cardiovascular;  Laterality: N/A;  . Abdominal aortagram N/A 03/17/2014    Procedure: ABDOMINAL Maxcine Ham;  Surgeon: [name] Hampshire, MD;  Location: [place] CATH LAB;  Service: Cardiovascular;  Laterality: N/A;  . Renal angiogram N/A 03/17/2014    Procedure: RENAL ANGIOGRAM;  Surgeon: [name] Hampshire, MD;  Location: [facility] CATH LAB;   Service: Cardiovascular;  Laterality: N/A;  . Left heart catheterization with coronary angiogram N/A 03/17/2014    Procedure: LEFT HEART CATHETERIZATION WITH CORONARY ANGIOGRAM;  Surgeon: [name] Hampshire, MD;  Location: [place] CATH LAB;  Service: Cardiovascular;  Laterality: N/A;  . Percutaneous stent intervention Right 03/17/2014    Procedure: PERCUTANEOUS STENT INTERVENTION;  Surgeon: [name] Hampshire, MD;  Location: [place] CATH LAB;  Service: Cardiovascular;  Laterality: Right;  Renal    Current Outpatient Prescriptions  Medication Sig Dispense Refill  . acetaminophen (TYLENOL) 325 MG tablet Take 325 mg by mouth every 6 (six) hours as needed. For pain.    Marland Kitchen amLODipine (NORVASC) 5 MG tablet Take 1 tablet (5 mg total) by mouth daily. 30 tablet 0  . aspirin EC 81 MG tablet Take 81 mg by mouth at bedtime.     . Cholecalciferol (VITAMIN D3) 1000 UNITS CAPS Take 1,000 capsules by mouth daily.    . clonazePAM (KLONOPIN) 0.5 MG tablet Take 0.125 mg by mouth at bedtime as needed for anxiety.     . cloNIDine (CATAPRES) 0.1 MG tablet Take 0.1 mg by mouth 2 (two) times daily as needed (SBP>170). Reported on 02/15/2016    . labetalol (NORMODYNE) 200 MG tablet Take 1 tablet (200 mg total) by mouth 2 (two) times daily. Take 1/2 tab (100mg ) by mouth every morning & 1 tab (200mg ) every evening 180 tablet 3  . Multiple Vitamins-Minerals (PRESERVISION/LUTEIN) CAPS Take 1 capsule by mouth 2 (two) times daily.     . polyethylene glycol (MIRALAX / GLYCOLAX) packet Take 17 g by mouth daily as needed for mild constipation. Constipation    . rosuvastatin (CRESTOR) 5 MG tablet TAKE 1 TABLET DAILY 90 tablet 3   No current facility-administered medications for this visit.   Allergies:  Cefixime; Conjugated estrogens; Morphine and related; Codeine; Iohexol; and Adenosine   Social History: The patient  reports that she quit smoking about 36 years ago. Her smoking use included Cigarettes. She has a .2 pack-year smoking history. She  has never used smokeless tobacco. She reports that she does not drink alcohol or use illicit drugs.   ROS:  Please see the history of present illness. Otherwise, complete review of systems is positive for recurring headaches, different regions of her head.  All other systems are reviewed and negative.   Physical Exam: VS:  BP 213/102 mmHg  Pulse 106  Ht 5\' 2"  (1.575 m)  Wt 117 lb 12.8 oz (53.434 kg)  BMI 21.54 kg/m2  SpO2 100%, BMI Body mass index is 21.54 kg/(m^2).  Wt Readings from Last 3 Encounters:  03/07/16 117 lb 12.8 oz (53.434 kg)  02/29/16 118 lb (53.524 kg)  02/15/16 117 lb (53.071 kg)    General: No distress. HEENT: Conjunctiva and lids normal, oropharynx clear. Neck: Supple, no elevated JVP or carotid bruits, no thyromegaly. Lungs: Clear to auscultation, nonlabored breathing at rest. Cardiac:  Regular rate and rhythm, no S3 or significant systolic murmur, no pericardial rub. Abdomen: Soft, nontender, no hepatomegaly, bowel sounds present, no guarding or rebound. Extremities: No pitting edema, distal pulses 2+. Skin: Warm and dry. Musculoskeletal: No kyphosis. Neuropsychiatric: Alert and oriented x3, affect grossly appropriate. No focal motor weakness or speech deficits.  ECG: I reviewed the tracing from 02/29/2016 which showed sinus rhythm with left atrial enlargement and nonspecific ST changes.  Recent Labwork: 02/29/2016: BUN 17; Creatinine, Ser 0.59; Hemoglobin 12.7; Platelets 179; Potassium 3.9; Sodium 136   Other Studies Reviewed Today:  Chest x-ray 02/29/2016: FINDINGS: Lung volumes are normal. No consolidative airspace disease. No pleural effusions. No pneumothorax. No pulmonary nodule or mass noted. Pulmonary vasculature and the cardiomediastinal silhouette are within normal limits. Atherosclerosis in the thoracic aorta.  IMPRESSION: 1. No radiographic evidence of acute cardiopulmonary disease. 2. Atherosclerosis.  Echocardiogram 03/09/2015: Study  Conclusions  - Left ventricle: The cavity size was normal. Systolic function was  normal. The estimated ejection fraction was in the range of 55%  to 60%. Wall motion was normal; there were no regional wall  motion abnormalities. Doppler parameters are consistent with  abnormal left ventricular relaxation (grade 1 diastolic  dysfunction). Doppler parameters are consistent with high  ventricular filling pressure. Mild focal basal septal  hypertrophy. - Aortic valve: Mildly calcified annulus. Trileaflet; mildly  thickened leaflets. - Mitral valve: Mild to moderately thickened leaflets . There was  trivial regurgitation. - Left atrium: The atrium was mildly dilated. - Tricuspid valve: There was mild-moderate regurgitation. - Pulmonic valve: There was mild regurgitation.  Assessment and Plan:  1. Uncontrolled hypertension. It looks like this may be occurring in episodes with periods of time where her blood pressure is reasonably well controlled. I am not entirely certain about her compliance with medications, but we did discuss this today. Plan for now is to increase labetalol to 200 mg twice daily, continue current dose of Norvasc. We will obtain renal artery duplex imaging to evaluate for renal artery stenosis, and I will also obtain a 24-hour urine for metanephrines and normetanephrines to at least investigate for pheochromocytoma, although unlikely.  2. Current headaches, in the process of further neurological evaluation. Brain MRI is pending.  3. History of nonobstructive CAD, no active angina symptoms. Most recent cardiac catheterization was within the last 2 years.  4. History of ectopic atrial tachycardia, no palpitations.  Current medicines were reviewed with the patient today.   Orders Placed This Encounter  Procedures  . Catecholamines, fractionated, urine, 24 hour    Disposition: FU with me in 2-3 weeks.   Signed, [name], MD, [facility] 03/07/2016 4:24  PM    [facility] [place], [place], [place] [address] Phone: [phone]; Fax: [phone]

## 2016-03-07 NOTE — Patient Instructions (Addendum)
Your physician has recommended you make the following change in your medication:  Take labetalol 200 mg twice daily. Continue all other medications the same. Your physician has requested that you have a renal artery duplex. During this test, an ultrasound is used to evaluate blood flow to the kidneys. Allow one hour for this exam. Do not eat after midnight the day before and avoid carbonated beverages. Take your medications as you usually do. Your doctor has ordered a 24 hour urine test on you. Please go to [facility] to collect the container to do this test. Your physician recommends that you schedule a follow-up appointment in: 3 weeks.

## 2016-03-08 ENCOUNTER — Inpatient Hospital Stay: Admission: RE | Admit: 2016-03-08 | Payer: Medicare Other | Source: Ambulatory Visit

## 2016-03-08 ENCOUNTER — Telehealth: Payer: Self-pay | Admitting: *Deleted

## 2016-03-08 NOTE — Telephone Encounter (Signed)
Patient called to let office know that she is having another spell like she did yesterday while in the office. Patient said her BP right now is 166/102 and earlier today when she took it, BP was 119/66. Patient said she has not started 24 hour urine testing yet because she didn't feel like picking it up the container on yesterday when she left the office. Nurse encouraged patient that she should have someone else pick it up for her if she didn't feel like doing it herself. Patient is asking if she can see Domenic Polite again today. Nurse advised patient that the plan would be exactly the same as yesterday. Patient encouraged to used her as needed clonidine for her elevated BP since she hasn't used it yet. Patient advised that if she felt like she couldn't wait for ordered testing to be completed that she could go to the ED for an evaluation. Patient encouraged to start requested testing as soon as possible and encouraged to go to the ED if her symptoms got worse. Patient verbalized understanding of plan.

## 2016-03-12 ENCOUNTER — Other Ambulatory Visit: Payer: Self-pay | Admitting: *Deleted

## 2016-03-12 DIAGNOSIS — I701 Atherosclerosis of renal artery: Secondary | ICD-10-CM | POA: Diagnosis not present

## 2016-03-12 DIAGNOSIS — I10 Essential (primary) hypertension: Secondary | ICD-10-CM | POA: Diagnosis not present

## 2016-03-12 MED ORDER — LABETALOL HCL 200 MG PO TABS: 200.0000 mg | ORAL_TABLET | Freq: Two times a day (BID) | ORAL | Status: DC

## 2016-03-14 ENCOUNTER — Encounter: Payer: Self-pay | Admitting: *Deleted

## 2016-03-14 ENCOUNTER — Ambulatory Visit: Payer: Medicare Other

## 2016-03-14 DIAGNOSIS — I701 Atherosclerosis of renal artery: Secondary | ICD-10-CM

## 2016-03-14 DIAGNOSIS — I10 Essential (primary) hypertension: Secondary | ICD-10-CM

## 2016-03-15 ENCOUNTER — Ambulatory Visit
Admission: RE | Admit: 2016-03-15 | Discharge: 2016-03-15 | Disposition: A | Discharge status: Home / Self Care | Payer: Medicare Other | Source: Ambulatory Visit | Attending: Neurology | Admitting: Neurology

## 2016-03-15 DIAGNOSIS — R51 Headache: Principal | ICD-10-CM

## 2016-03-15 DIAGNOSIS — Z955 Presence of coronary angioplasty implant and graft: Secondary | ICD-10-CM

## 2016-03-15 DIAGNOSIS — I6381 Other cerebral infarction due to occlusion or stenosis of small artery: Secondary | ICD-10-CM

## 2016-03-15 DIAGNOSIS — I639 Cerebral infarction, unspecified: Secondary | ICD-10-CM | POA: Diagnosis not present

## 2016-03-15 DIAGNOSIS — R519 Headache, unspecified: Secondary | ICD-10-CM

## 2016-03-15 MED ORDER — GADOBENATE DIMEGLUMINE 529 MG/ML IV SOLN
12.0000 mL | Freq: Once | INTRAVENOUS | Status: AC | PRN
  Administered 2016-03-15: 12 mL via INTRAVENOUS

## 2016-03-19 ENCOUNTER — Telehealth: Payer: Self-pay | Admitting: *Deleted

## 2016-03-19 ENCOUNTER — Telehealth: Payer: Self-pay

## 2016-03-19 NOTE — Telephone Encounter (Signed)
Patient informed. 

## 2016-03-19 NOTE — Progress Notes (Signed)
Quick Note:  Please call patient regarding the recent brain MRI: The brain scan showed a normal structure of the brain and mild volume loss which we call atrophy. There were changes in the deeper structures of the brain, which we call white matter changes or microvascular changes. These were reported as mild in Her case. These are tiny white spots, that occur with time and are seen in a variety of conditions, including with normal aging, chronic hypertension, chronic headaches, especially migraine HAs, chronic diabetes, chronic hyperlipidemia. These are not strokes and no mass or lesion were seen which is reassuring. Again, there were no acute findings, such as a stroke, or mass or blood products. No further action is required on this test at this time, other than re-enforcing the importance of good blood pressure control, good cholesterol control, good blood sugar control, and weight management.  All in all, compared to her brain MRI from 11 years ago, mild progression, which is expected in this time span, nothing surprising, mostly age appropriate findings. Old lacunar stroke is stable, no new stroke. Overall reassuring scan. Please remind patient to keep any upcoming appointments or tests and to call us with any interim questions, concerns, problems or updates. Thanks,  Star Age, MD, PhD    ______

## 2016-03-19 NOTE — Telephone Encounter (Signed)
-----   Message from Star Age, MD sent at 03/19/2016  7:44 AM EDT ----- Please call patient regarding the recent brain MRI: The brain scan showed a normal structure of the brain and mild volume loss which we call atrophy. There were changes in the deeper structures of the brain, which we call white matter changes or microvascular changes. These were reported as mild in Her case. These are tiny white spots, that occur with time and are seen in a variety of conditions, including with normal aging, chronic hypertension, chronic headaches, especially migraine HAs, chronic diabetes, chronic hyperlipidemia. These are not strokes and no mass or lesion were seen which is reassuring. Again, there were no acute findings, such as a stroke, or mass or blood products. No further action is required on this test at this time, other than re-enforcing the importance of good blood pressure control, good cholesterol control, good blood sugar control, and weight management.  All in all, compared to her brain MRI from 11 years ago, mild progression, which is expected in this time span, nothing surprising, mostly age appropriate findings. Old lacunar stroke is stable, no new stroke. Overall reassuring scan. Please remind patient to keep any upcoming appointments or tests and to call us with any interim questions, concerns, problems or updates. Thanks,  Star Age, MD, PhD

## 2016-03-19 NOTE — Telephone Encounter (Signed)
I spoke to patient and she is aware of results and recommendations. Voiced understanding.  

## 2016-03-19 NOTE — Telephone Encounter (Signed)
-----   Message from [name], MD sent at 03/19/2016  1:36 PM EDT ----- Reviewed. Please let her know that the urinary catecholamines were normal, therefore would not be consistent with unusual cause of hypertension such as pheochromocytoma.

## 2016-03-19 NOTE — Telephone Encounter (Signed)
-----   Message from [name], MD sent at 03/19/2016  8:40 AM EDT ----- Reviewed. Please let her know that the renal arteries appeared normal by this study indicating patency of prior right renal stent. Also stable bilateral kidney size.

## 2016-03-27 DIAGNOSIS — I10 Essential (primary) hypertension: Secondary | ICD-10-CM | POA: Diagnosis not present

## 2016-03-27 DIAGNOSIS — R5383 Other fatigue: Secondary | ICD-10-CM | POA: Diagnosis not present

## 2016-03-28 ENCOUNTER — Encounter: Payer: Self-pay | Admitting: Cardiology

## 2016-03-28 ENCOUNTER — Ambulatory Visit (INDEPENDENT_AMBULATORY_CARE_PROVIDER_SITE_OTHER): Payer: Medicare Other | Admitting: Cardiology

## 2016-03-28 VITALS — BP 128/82 | HR 68 | Ht 62.0 in | Wt 120.0 lb

## 2016-03-28 DIAGNOSIS — I639 Cerebral infarction, unspecified: Secondary | ICD-10-CM | POA: Diagnosis not present

## 2016-03-28 DIAGNOSIS — I2510 Atherosclerotic heart disease of native coronary artery without angina pectoris: Secondary | ICD-10-CM | POA: Diagnosis not present

## 2016-03-28 DIAGNOSIS — I10 Essential (primary) hypertension: Secondary | ICD-10-CM | POA: Diagnosis not present

## 2016-03-28 NOTE — Progress Notes (Signed)
Cardiology Office Note  Date: 03/28/2016   ID: [name], DOB 1935-12-04, MRN [id number]  PCP: [name] Chroman, MD  Primary Cardiologist: [name] Lesches, MD   Chief Complaint  Patient presents with  . Hypertension    History of Present Illness: [name] is a 80 y.o. female last seen in the office in April. Records reviewed at that time. We discussed compliance with medications and adjusted her antihypertensive regimen somewhat. She was also referred for a follow-up renal artery duplex and 24 hour urine for metanephrines and normetanephrines.  Urinary catecholamines were within normal range which would argue against pheochromocytoma. Renal artery duplex was also normal.We discussed the results again today.  Fortunately, she seems to be doing better. I reviewed her home blood pressure checks and the trend is better. She is spreading her medications out evenly over the day and is satisfied with how things are looking. She does mention that she feels like anxiety and intermittent panic does seem to contribute to some of her elevated blood pressures.  Past Medical History  Diagnosis Date  . Essential hypertension, benign   . Non-obstructive CAD     a. Reportedly nonobstructive at catheterization in the 1990s;  b. 03/2014 Cath: LM nl, LAD 30-40p, 37m, D1 20ost, D2 50ost/p, D3 small, LCX nl/nondominant, OM 1/2/3 nl, RCA 20-30p, 91m, PDA nl, RPL1/2  small, nl.  . Macular degeneration   . Anxiety disorder   . Diverticulosis     Left sided noted on colonoscopy 02/2006  . Chronic constipation   . Esophageal reflux disease   . Lumbar radiculopathy   . Hyperlipidemia   . Hiatal hernia   . Arthritis   . Penetrating atherosclerotic ulcer of aorta (HCC)     Complex ulcerated plaque of the infrarenal abdominal aorta mild renal artery stenosis on the right side, normal left renal artery catheterization 2009, status post aortic stent graft  . Claudication [facility])     Chronic occlusion of  the right limb of aortic stent graft.  . Trigeminal neuralgia     Right  . Type 2 diabetes mellitus ([place])   . Anemia   . Migraines   . Renal artery stenosis ([place])     a. Right renal artery stent April 2013 - Dr. Fletcher Anon;  b. 03/2014 PTA/stenting of RRA 2/2 ISR: 6x18 Herculink stent.  . Ectopic atrial tachycardia [facility])     Current Outpatient Prescriptions  Medication Sig Dispense Refill  . acetaminophen (TYLENOL) 325 MG tablet Take 325 mg by mouth every 6 (six) hours as needed. For pain.    Marland Kitchen amLODipine (NORVASC) 5 MG tablet Take 1 tablet (5 mg total) by mouth daily. 30 tablet 0  . aspirin EC 81 MG tablet Take 81 mg by mouth at bedtime.     . Cholecalciferol (VITAMIN D3) 1000 UNITS CAPS Take 1,000 capsules by mouth daily.    . clonazePAM (KLONOPIN) 0.5 MG tablet Take 0.125 mg by mouth at bedtime as needed for anxiety.     . cloNIDine (CATAPRES) 0.1 MG tablet Take 0.1 mg by mouth 2 (two) times daily as needed (SBP>170). Reported on 02/15/2016    . labetalol (NORMODYNE) 200 MG tablet Take 1 tablet (200 mg total) by mouth 2 (two) times daily. 180 tablet 3  . Multiple Vitamins-Minerals (PRESERVISION/LUTEIN) CAPS Take 1 capsule by mouth 2 (two) times daily.     . polyethylene glycol (MIRALAX / GLYCOLAX) packet Take 17 g by mouth daily as needed for mild constipation.  Constipation    . rosuvastatin (CRESTOR) 5 MG tablet TAKE 1 TABLET DAILY 90 tablet 3   No current facility-administered medications for this visit.   Allergies:  Cefixime; Conjugated estrogens; Morphine and related; Codeine; Iohexol; and Adenosine   Social History: The patient  reports that she quit smoking about 36 years ago. Her smoking use included Cigarettes. She has a .2 pack-year smoking history. She has never used smokeless tobacco. She reports that she does not drink alcohol or use illicit drugs.   ROS:  Please see the history of present illness. Otherwise, complete review of systems is positive for anxiety.  All other systems  are reviewed and negative.   Physical Exam: VS:  BP 128/82 mmHg  Pulse 68  Ht 5\' 2"  (1.575 m)  Wt 120 lb (54.432 kg)  BMI 21.94 kg/m2  SpO2 98%, BMI Body mass index is 21.94 kg/(m^2).  Wt Readings from Last 3 Encounters:  03/28/16 120 lb (54.432 kg)  03/07/16 117 lb 12.8 oz (53.434 kg)  02/29/16 118 lb (53.524 kg)    General: No distress. HEENT: Conjunctiva and lids normal, oropharynx clear. Neck: Supple, no elevated JVP or carotid bruits, no thyromegaly. Lungs: Clear to auscultation, nonlabored breathing at rest. Cardiac: Regular rate and rhythm, no S3 or significant systolic murmur, no pericardial rub. Abdomen: Soft, nontender, no hepatomegaly, bowel sounds present, no guarding or rebound. Extremities: No pitting edema, distal pulses 2+.  ECG: I personally reviewed the tracing from 02/29/2016 which showed sinus rhythm with left atrial enlargement and nonspecific ST changes. .  Recent Labwork: 02/29/2016: BUN 17; Creatinine, Ser 0.59; Hemoglobin 12.7; Platelets 179; Potassium 3.9; Sodium 136   Other Studies Reviewed Today:  Echocardiogram 03/09/2015: Study Conclusions  - Left ventricle: The cavity size was normal. Systolic function was  normal. The estimated ejection fraction was in the range of 55%  to 60%. Wall motion was normal; there were no regional wall  motion abnormalities. Doppler parameters are consistent with  abnormal left ventricular relaxation (grade 1 diastolic  dysfunction). Doppler parameters are consistent with high  ventricular filling pressure. Mild focal basal septal  hypertrophy. - Aortic valve: Mildly calcified annulus. Trileaflet; mildly  thickened leaflets. - Mitral valve: Mild to moderately thickened leaflets . There was  trivial regurgitation. - Left atrium: The atrium was mildly dilated. - Tricuspid valve: There was mild-moderate regurgitation. - Pulmonic valve: There was mild regurgitation.  Renal artery duplex 03/14/2016: Normal  caliber abdominal aorta. Aorto-iliac atherosclerosis, without stenosis. Normal and essentially stable bilateral kidney size. Normal bilateral renal arteries, s/p right renal artery stent. The IVC and renal veins are patent.  Assessment and Plan:  1. Essential hypertension, blood pressure control trend is better. Went to continue present medical regimen. Recent workup for other secondary causes was negative as outlined above.  2. History of nonobstructive CAD without active angina symptoms. Continue observation.  Current medicines were reviewed with the patient today.  Disposition: FU with me in 3 months.   Signed, [name], MD, [facility] 03/28/2016 11:05 AM    [facility] [place], [place], [place] [address] Phone: [phone]; Fax: [phone]

## 2016-03-28 NOTE — Patient Instructions (Signed)
Your physician recommends that you continue on your current medications as directed. Please refer to the Current Medication list given to you today.  Your physician recommends that you schedule a follow-up appointment in: 3 months  

## 2016-03-29 DIAGNOSIS — H26492 Other secondary cataract, left eye: Secondary | ICD-10-CM | POA: Diagnosis not present

## 2016-04-05 DIAGNOSIS — H26491 Other secondary cataract, right eye: Secondary | ICD-10-CM | POA: Diagnosis not present

## 2016-04-18 DIAGNOSIS — N8111 Cystocele, midline: Secondary | ICD-10-CM | POA: Diagnosis not present

## 2016-04-18 DIAGNOSIS — Z6823 Body mass index (BMI) 23.0-23.9, adult: Secondary | ICD-10-CM | POA: Diagnosis not present

## 2016-05-02 DIAGNOSIS — Z0000 Encounter for general adult medical examination without abnormal findings: Secondary | ICD-10-CM | POA: Diagnosis not present

## 2016-05-02 DIAGNOSIS — Z299 Encounter for prophylactic measures, unspecified: Secondary | ICD-10-CM | POA: Diagnosis not present

## 2016-05-02 DIAGNOSIS — Z7189 Other specified counseling: Secondary | ICD-10-CM | POA: Diagnosis not present

## 2016-05-02 DIAGNOSIS — Z1389 Encounter for screening for other disorder: Secondary | ICD-10-CM | POA: Diagnosis not present

## 2016-05-02 DIAGNOSIS — Z1211 Encounter for screening for malignant neoplasm of colon: Secondary | ICD-10-CM | POA: Diagnosis not present

## 2016-05-02 DIAGNOSIS — Z6822 Body mass index (BMI) 22.0-22.9, adult: Secondary | ICD-10-CM | POA: Diagnosis not present

## 2016-05-03 DIAGNOSIS — Z79899 Other long term (current) drug therapy: Secondary | ICD-10-CM | POA: Diagnosis not present

## 2016-05-03 DIAGNOSIS — E7800 Pure hypercholesterolemia, unspecified: Secondary | ICD-10-CM | POA: Diagnosis not present

## 2016-05-03 DIAGNOSIS — R5383 Other fatigue: Secondary | ICD-10-CM | POA: Diagnosis not present

## 2016-05-08 DIAGNOSIS — H353132 Nonexudative age-related macular degeneration, bilateral, intermediate dry stage: Secondary | ICD-10-CM | POA: Diagnosis not present

## 2016-05-08 DIAGNOSIS — H43393 Other vitreous opacities, bilateral: Secondary | ICD-10-CM | POA: Diagnosis not present

## 2016-05-08 DIAGNOSIS — E119 Type 2 diabetes mellitus without complications: Secondary | ICD-10-CM | POA: Diagnosis not present

## 2016-05-08 DIAGNOSIS — H02051 Trichiasis without entropian right upper eyelid: Secondary | ICD-10-CM | POA: Diagnosis not present

## 2016-05-14 DIAGNOSIS — R0989 Other specified symptoms and signs involving the circulatory and respiratory systems: Secondary | ICD-10-CM | POA: Diagnosis not present

## 2016-05-16 ENCOUNTER — Ambulatory Visit: Payer: Medicare Other | Admitting: Neurology

## 2016-06-21 ENCOUNTER — Other Ambulatory Visit: Payer: Self-pay | Admitting: *Deleted

## 2016-06-21 MED ORDER — AMLODIPINE BESYLATE 5 MG PO TABS: 5.0000 mg | ORAL_TABLET | Freq: Every day | ORAL | 3 refills | Status: DC

## 2016-06-28 NOTE — Progress Notes (Signed)
Cardiology Office Note  Date: 06/29/2016   ID: [name], DOB 05/19/1936, MRN [id number]  PCP: Glenda Chroman, MD  Primary Cardiologist: Rozann Lesches, MD   Chief Complaint  Patient presents with  . Hypertension  . Coronary Artery Disease    History of Present Illness: [name] is a 80 y.o. female last seen in May. She presents for a routine follow-up visit. Reports no chest pain. States that she has been compliant with her medications and blood pressure looks well controlled today. She reports chronic fatigue and also headaches. She had a recent visit with Dr. Woody Seller for a physical.  She reports limitations related to chronic back and right hip pain. Still tries to walk for exercise and I have encouraged her to maintain regular activity as tolerated. Weight has been stable.  Past Medical History:  Diagnosis Date  . Anemia   . Anxiety disorder   . Arthritis   . Chronic constipation   . Claudication [facility])    Chronic occlusion of the right limb of aortic stent graft.  . Diverticulosis    Left sided noted on colonoscopy 02/2006  . Ectopic atrial tachycardia ([place])   . Esophageal reflux disease   . Essential hypertension, benign   . Hiatal hernia   . Hyperlipidemia   . Lumbar radiculopathy   . Macular degeneration   . Migraines   . Non-obstructive CAD    a. Reportedly nonobstructive at catheterization in the 1990s;  b. 03/2014 Cath: LM nl, LAD 30-40p, 83m, D1 20ost, D2 50ost/p, D3 small, LCX nl/nondominant, OM 1/2/3 nl, RCA 20-30p, 49m, PDA nl, RPL1/2  small, nl.  . Penetrating atherosclerotic ulcer of aorta (HCC)    Complex ulcerated plaque of the infrarenal abdominal aorta mild renal artery stenosis on the right side, normal left renal artery catheterization 2009, status post aortic stent graft  . Renal artery stenosis ([place])    a. Right renal artery stent April 2013 - Dr. Fletcher Anon;  b. 03/2014 PTA/stenting of RRA 2/2 ISR: 6x18 Herculink stent.  . Trigeminal neuralgia      Right  . Type 2 diabetes mellitus ([place])     Current Outpatient Prescriptions  Medication Sig Dispense Refill  . acetaminophen (TYLENOL) 325 MG tablet Take 325 mg by mouth every 6 (six) hours as needed. For pain.    Marland Kitchen amLODipine (NORVASC) 5 MG tablet Take 1 tablet (5 mg total) by mouth daily. 90 tablet 3  . aspirin EC 81 MG tablet Take 81 mg by mouth at bedtime.     . Cholecalciferol (VITAMIN D3) 1000 UNITS CAPS Take 1,000 capsules by mouth daily.    . clonazePAM (KLONOPIN) 0.5 MG tablet Take 0.125 mg by mouth at bedtime as needed for anxiety.     . cloNIDine (CATAPRES) 0.1 MG tablet Take 0.1 mg by mouth 2 (two) times daily as needed (SBP>170). Reported on 02/15/2016    . labetalol (NORMODYNE) 200 MG tablet Take 1 tablet (200 mg total) by mouth 2 (two) times daily. 180 tablet 3  . Multiple Vitamins-Minerals (PRESERVISION/LUTEIN) CAPS Take 1 capsule by mouth 2 (two) times daily.     . polyethylene glycol (MIRALAX / GLYCOLAX) packet Take 17 g by mouth daily as needed for mild constipation. Constipation    . rosuvastatin (CRESTOR) 5 MG tablet TAKE 1 TABLET DAILY 90 tablet 3   No current facility-administered medications for this visit.    Allergies:  Cefixime; Conjugated estrogens; Morphine and related; Codeine; Iohexol; and Adenosine  Social History: The patient  reports that she quit smoking about 36 years ago. Her smoking use included Cigarettes. She has a 0.20 pack-year smoking history. She has never used smokeless tobacco. She reports that she does not drink alcohol or use drugs.   ROS:  Please see the history of present illness. Otherwise, complete review of systems is positive for headaches.  All other systems are reviewed and negative.   Physical Exam: VS:  BP 120/82   Pulse 76   Ht 5\' 2"  (1.575 m)   Wt 116 lb (52.6 kg)   SpO2 98%   BMI 21.22 kg/m , BMI Body mass index is 21.22 kg/m.  Wt Readings from Last 3 Encounters:  06/29/16 116 lb (52.6 kg)  03/28/16 120 lb (54.4 kg)   03/07/16 117 lb 12.8 oz (53.4 kg)    General: No distress. HEENT: Conjunctiva and lids normal, oropharynx clear. Neck: Supple, no elevated JVP or carotid bruits, no thyromegaly. Lungs: Clear to auscultation, nonlabored breathing at rest. Cardiac: Regular rate and rhythm, no S3 or significant systolic murmur, no pericardial rub. Abdomen: Soft, nontender, no hepatomegaly, bowel sounds present, no guarding or rebound. Extremities: No pitting edema, distal pulses 2+.  ECG: I personally reviewed the tracing from 02/29/2016 which showed sinus rhythm with left atrial enlargement and nonspecific ST changes.  Recent Labwork: 02/29/2016: BUN 17; Creatinine, Ser 0.59; Hemoglobin 12.7; Platelets 179; Potassium 3.9; Sodium 136   Other Studies Reviewed Today:  Echocardiogram 03/09/2015:  Study Conclusions  - Left ventricle: The cavity size was normal. Systolic function was  normal. The estimated ejection fraction was in the range of 55%  to 60%. Wall motion was normal; there were no regional wall  motion abnormalities. Doppler parameters are consistent with  abnormal left ventricular relaxation (grade 1 diastolic  dysfunction). Doppler parameters are consistent with high  ventricular filling pressure. Mild focal basal septal  hypertrophy. - Aortic valve: Mildly calcified annulus. Trileaflet; mildly  thickened leaflets. - Mitral valve: Mild to moderately thickened leaflets . There was  trivial regurgitation. - Left atrium: The atrium was mildly dilated. - Tricuspid valve: There was mild-moderate regurgitation. - Pulmonic valve: There was mild regurgitation.  Renal artery duplex 03/14/2016: Normal caliber abdominal aorta. Aorto-iliac atherosclerosis, without stenosis. Normal and essentially stable bilateral kidney size. Normal bilateral renal arteries, s/p right renal artery stent. The IVC and renal veins are patent.  Assessment and Plan:  1. Essential hypertension, blood  pressure is adequately controlled today plan to continue current regimen and observation.  2. Nonobstructive CAD without active angina symptoms. Continue medical therapy including aspirin and statin.  Current medicines were reviewed with the patient today.   Disposition: Follow-up with me in 6 months.  Signed, [name], MD, [facility] 06/29/2016 11:54 AM    [facility] [place], [place], [place] [address] Phone: [phone]; Fax: [phone]

## 2016-06-29 ENCOUNTER — Encounter: Payer: Self-pay | Admitting: Cardiology

## 2016-06-29 ENCOUNTER — Ambulatory Visit (INDEPENDENT_AMBULATORY_CARE_PROVIDER_SITE_OTHER): Payer: Medicare Other | Admitting: Cardiology

## 2016-06-29 VITALS — BP 120/82 | HR 76 | Ht 62.0 in | Wt 116.0 lb

## 2016-06-29 DIAGNOSIS — I10 Essential (primary) hypertension: Secondary | ICD-10-CM | POA: Diagnosis not present

## 2016-06-29 DIAGNOSIS — I639 Cerebral infarction, unspecified: Secondary | ICD-10-CM | POA: Diagnosis not present

## 2016-06-29 DIAGNOSIS — I2510 Atherosclerotic heart disease of native coronary artery without angina pectoris: Secondary | ICD-10-CM

## 2016-06-29 NOTE — Patient Instructions (Signed)
Medication Instructions:  Continue all current medications.  Labwork: None  Testing/Procedures: none  Follow-Up: Your physician wants you to follow up in: 6 months.  You will receive a reminder letter in the mail one-two months in advance.  If you don't receive a letter, please call our office to schedule the follow up appointment   Any Other Special Instructions Will Be Listed Below (If Applicable).  If you need a refill on your cardiac medications before your next appointment, please call your pharmacy.

## 2016-07-06 DIAGNOSIS — Z6823 Body mass index (BMI) 23.0-23.9, adult: Secondary | ICD-10-CM | POA: Diagnosis not present

## 2016-07-06 DIAGNOSIS — N8111 Cystocele, midline: Secondary | ICD-10-CM | POA: Diagnosis not present

## 2016-07-20 ENCOUNTER — Telehealth: Payer: Self-pay | Admitting: *Deleted

## 2016-07-20 NOTE — Telephone Encounter (Signed)
Received fax from [facility] requesting refill for Amlodipine 2.5mg .    Call placed to patient as we have her Amlodipine listed at 5mg  daily.  Patient stated that her & Dr. Domenic Polite had talked about this & he told her to spread out the 5mg  throughout the day because she couldn't handle that amount all at once.  Stated that she has been breaking the 5mg  in half & taking that daily.  Would like to have the 2.5mg  sent to [facility] since the 5mg  tabs are difficult to break.    Will fwd to provider tor approval as I do not see documented in chart.

## 2016-07-22 NOTE — Telephone Encounter (Signed)
That is fine. Just make sure she has enough of the Norvasc 2.5 mg tablets to take it twice a day.

## 2016-07-24 MED ORDER — AMLODIPINE BESYLATE 2.5 MG PO TABS: 2.5000 mg | ORAL_TABLET | Freq: Two times a day (BID) | ORAL | 3 refills | Status: DC

## 2016-07-24 NOTE — Telephone Encounter (Signed)
Patient notified.  Will send new rx to [facility] for 90 day supply now.

## 2016-07-26 ENCOUNTER — Telehealth: Payer: Self-pay | Admitting: Cardiology

## 2016-07-26 MED ORDER — ROSUVASTATIN CALCIUM 5 MG PO TABS: 5.0000 mg | ORAL_TABLET | Freq: Every day | ORAL | 3 refills | Status: DC

## 2016-07-26 NOTE — Telephone Encounter (Signed)
Medication question about Crestor

## 2016-07-27 NOTE — Telephone Encounter (Signed)
[name] called stating she went to pick up the Crestor at [facility] and was told that it was sent to [facility]. Please call patient at home #

## 2016-07-27 NOTE — Telephone Encounter (Signed)
Call placed to [facility].  Pharmacy could see that she had one filled in July for 90 day so now it was popping up too early to fill.  Pharm said new rx can be picked up tomorrow though.  Patient made aware.

## 2016-08-02 DIAGNOSIS — E1165 Type 2 diabetes mellitus with hyperglycemia: Secondary | ICD-10-CM | POA: Diagnosis not present

## 2016-08-02 DIAGNOSIS — I10 Essential (primary) hypertension: Secondary | ICD-10-CM | POA: Diagnosis not present

## 2016-08-02 DIAGNOSIS — Z713 Dietary counseling and surveillance: Secondary | ICD-10-CM | POA: Diagnosis not present

## 2016-08-02 DIAGNOSIS — M25551 Pain in right hip: Secondary | ICD-10-CM | POA: Diagnosis not present

## 2016-08-16 ENCOUNTER — Other Ambulatory Visit: Payer: Self-pay | Admitting: Cardiology

## 2016-08-16 MED ORDER — ROSUVASTATIN CALCIUM 5 MG PO TABS: 5.0000 mg | ORAL_TABLET | Freq: Every day | ORAL | 3 refills | Status: DC

## 2016-08-16 NOTE — Telephone Encounter (Signed)
rosuvastatin (CRESTOR) 5 MG tablet   Wants the one made by [employer] sent to [facility]

## 2016-08-16 NOTE — Telephone Encounter (Signed)
Medication sent to pharmacy  

## 2016-10-10 DIAGNOSIS — Z23 Encounter for immunization: Secondary | ICD-10-CM | POA: Diagnosis not present

## 2017-01-03 NOTE — Progress Notes (Signed)
Cardiology Office Note  Date: 01/04/2017   ID: Nashiya, Greynolds Nov 24, 1935, MRN [id number]  PCP: Glenda Chroman, MD  Primary Cardiologist: Rozann Lesches, MD   Chief Complaint  Patient presents with  . Hypertension    History of Present Illness: [name] is a 81 y.o. female last seen in August 2017. She presents for a routine follow-up visit. Reports no palpitations or chest pain. States that she has been chronically fatigued, also having regular headaches. This has been a long-term problem, she has seen neurology in the past. States that she has a visit with Dr. Woody Seller in the next few weeks.  I reviewed her medications. She reports compliance with her antihypertensives. Current regimen includes Norvasc, clonidine, and labetalol.  Echocardiogram from 2016 showed LVEF 55-60%. She has not had recent carotid Dopplers.  Past Medical History:  Diagnosis Date  . Anemia   . Anxiety disorder   . Arthritis   . Chronic constipation   . Claudication [facility])    Chronic occlusion of the right limb of aortic stent graft.  . Diverticulosis    Left sided noted on colonoscopy 02/2006  . Ectopic atrial tachycardia ([place])   . Esophageal reflux disease   . Essential hypertension, benign   . Hiatal hernia   . Hyperlipidemia   . Lumbar radiculopathy   . Macular degeneration   . Migraines   . Non-obstructive CAD    a. Reportedly nonobstructive at catheterization in the 1990s;  b. 03/2014 Cath: LM nl, LAD 30-40p, 76m, D1 20ost, D2 50ost/p, D3 small, LCX nl/nondominant, OM 1/2/3 nl, RCA 20-30p, 39m, PDA nl, RPL1/2  small, nl.  . Penetrating atherosclerotic ulcer of aorta (HCC)    Complex ulcerated plaque of the infrarenal abdominal aorta mild renal artery stenosis on the right side, normal left renal artery catheterization 2009, status post aortic stent graft  . Renal artery stenosis ([place])    a. Right renal artery stent April 2013 - Dr. Fletcher Anon;  b. 03/2014 PTA/stenting of RRA 2/2 ISR: 6x18  Herculink stent.  . Trigeminal neuralgia    Right  . Type 2 diabetes mellitus ([place])     Past Surgical History:  Procedure Laterality Date  . ABDOMINAL AORTAGRAM N/A 02/13/2012   Procedure: ABDOMINAL Maxcine Ham;  Surgeon: Wellington Hampshire, MD;  Location: [facility] CATH LAB;  Service: Cardiovascular;  Laterality: N/A;  . ABDOMINAL AORTAGRAM N/A 03/17/2014   Procedure: ABDOMINAL Maxcine Ham;  Surgeon: Wellington Hampshire, MD;  Location: [place] [facility] CATH LAB;  Service: Cardiovascular;  Laterality: N/A;  . ABDOMINAL HYSTERECTOMY  1982  . BREAST BIOPSY Left 1990's  . CAROTID ENDARTERECTOMY Left 1993  . Endologix powerlink bifurcated     Covered stent graft  . LEFT HEART CATHETERIZATION WITH CORONARY ANGIOGRAM N/A 03/17/2014   Procedure: LEFT HEART CATHETERIZATION WITH CORONARY ANGIOGRAM;  Surgeon: Wellington Hampshire, MD;  Location: [facility] CATH LAB;  Service: Cardiovascular;  Laterality: N/A;  . LUMBAR [place]  . PERCUTANEOUS STENT INTERVENTION Right 03/17/2014   Procedure: PERCUTANEOUS STENT INTERVENTION;  Surgeon: Wellington Hampshire, MD;  Location: [facility] CATH LAB;  Service: Cardiovascular;  Laterality: Right;  Renal  . RENAL ANGIOGRAM N/A 02/13/2012   Procedure: RENAL ANGIOGRAM;  Surgeon: Wellington Hampshire, MD;  Location: [facility] CATH LAB;  Service: Cardiovascular;  Laterality: N/A;  . RENAL ANGIOGRAM N/A 03/17/2014   Procedure: RENAL ANGIOGRAM;  Surgeon: Wellington Hampshire, MD;  Location: [facility] CATH LAB;  Service: Cardiovascular;  Laterality: N/A;  . TONSILLECTOMY AND  ADENOIDECTOMY  1947    Current Outpatient Prescriptions  Medication Sig Dispense Refill  . acetaminophen (TYLENOL) 325 MG tablet Take 325 mg by mouth every 6 (six) hours as needed. For pain.    Marland Kitchen amLODipine (NORVASC) 2.5 MG tablet Take 1 tablet (2.5 mg total) by mouth 2 (two) times daily. 180 tablet 3  . aspirin EC 81 MG tablet Take 81 mg by mouth at bedtime.     . Cholecalciferol (VITAMIN D3) 1000 UNITS CAPS Take 1,000 capsules by mouth daily.    . clonazePAM  (KLONOPIN) 0.5 MG tablet Take 0.125 mg by mouth at bedtime as needed for anxiety.     . cloNIDine (CATAPRES) 0.1 MG tablet Take 0.1 mg by mouth 2 (two) times daily as needed (SBP>170). Reported on 02/15/2016    . labetalol (NORMODYNE) 200 MG tablet Take 1 tablet (200 mg total) by mouth 2 (two) times daily. 180 tablet 3  . Multiple Vitamins-Minerals (PRESERVISION/LUTEIN) CAPS Take 1 capsule by mouth 2 (two) times daily.     . polyethylene glycol (MIRALAX / GLYCOLAX) packet Take 17 g by mouth daily as needed for mild constipation. Constipation    . rosuvastatin (CRESTOR) 5 MG tablet Take 1 tablet (5 mg total) by mouth daily. 90 tablet 3   No current facility-administered medications for this visit.    Allergies:  Cefixime; Conjugated estrogens; Morphine and related; Codeine; Iohexol; and Adenosine   Social History: The patient  reports that she quit smoking about 37 years ago. Her smoking use included Cigarettes. She has a 0.20 pack-year smoking history. She has never used smokeless tobacco. She reports that she does not drink alcohol or use drugs.   ROS:  Please see the history of present illness. Otherwise, complete review of systems is positive for chronic headaches.  All other systems are reviewed and negative.   Physical Exam: VS:  BP (!) 150/85   Pulse 79   Ht 5\' 1"  (1.549 m)   Wt 124 lb (56.2 kg)   SpO2 100%   BMI 23.43 kg/m , BMI Body mass index is 23.43 kg/m.  Wt Readings from Last 3 Encounters:  01/04/17 124 lb (56.2 kg)  06/29/16 116 lb (52.6 kg)  03/28/16 120 lb (54.4 kg)    General: No distress. HEENT: Conjunctiva and lids normal, oropharynx clear. Neck: Supple, no elevated JVP or carotid bruits, no thyromegaly. Lungs: Clear to auscultation, nonlabored breathing at rest. Cardiac: Regular rate and rhythm, no S3 or significant systolic murmur, no pericardial rub. Abdomen: Soft, nontender, no hepatomegaly, bowel sounds present, no guarding or rebound. Extremities: No pitting  edema, distal pulses 2+. Skin: Warm and dry. Musculoskeletal: No kyphosis. Neuropsychiatric: Alert and oriented 3, affect appropriate.  ECG: I personally reviewed the tracing from 02/29/2016 which showed sinus rhythm with left atrial enlargement and nonspecific ST changes.  Recent Labwork: 02/29/2016: BUN 17; Creatinine, Ser 0.59; Hemoglobin 12.7; Platelets 179; Potassium 3.9; Sodium 136  Other Studies Reviewed Today:  Echocardiogram 03/09/2015:  Study Conclusions  - Left ventricle: The cavity size was normal. Systolic function was  normal. The estimated ejection fraction was in the range of 55%  to 60%. Wall motion was normal; there were no regional wall  motion abnormalities. Doppler parameters are consistent with  abnormal left ventricular relaxation (grade 1 diastolic  dysfunction). Doppler parameters are consistent with high  ventricular filling pressure. Mild focal basal septal  hypertrophy. - Aortic valve: Mildly calcified annulus. Trileaflet; mildly  thickened leaflets. - Mitral valve: Mild to moderately  thickened leaflets . There was  trivial regurgitation. - Left atrium: The atrium was mildly dilated. - Tricuspid valve: There was mild-moderate regurgitation. - Pulmonic valve: There was mild regurgitation.  Renal artery duplex 03/14/2016: Normal caliber abdominal aorta. Aorto-iliac atherosclerosis, without stenosis. Normal and essentially stable bilateral kidney size. Normal bilateral renal arteries, s/p right renal artery stent. The IVC and renal veins are patent.  Assessment and Plan:  1. Essential hypertension, no changes made to current regimen. Reinforced compliance and salt restriction.  2. History of nonobstructive CAD, no active angina symptoms. She remains on aspirin and statin therapy.  3. History of PAD including carotids and renals. Renal artery duplex from last year showed no evidence of RAS. She is due for follow-up carotid Dopplers which will  be arranged.  4. Chronic headaches, I recommended that she consider follow-up with neurology.  Current medicines were reviewed with the patient today.  Disposition: Follow-up in 6 months.  Signed, [name], MD, [facility] 01/04/2017 11:43 AM    [facility] [place], [place], [place] [address] Phone: [phone]; Fax: [phone]

## 2017-01-04 ENCOUNTER — Ambulatory Visit (INDEPENDENT_AMBULATORY_CARE_PROVIDER_SITE_OTHER): Payer: Medicare Other | Admitting: Cardiology

## 2017-01-04 ENCOUNTER — Encounter: Payer: Self-pay | Admitting: Cardiology

## 2017-01-04 VITALS — BP 150/85 | HR 79 | Ht 61.0 in | Wt 124.0 lb

## 2017-01-04 DIAGNOSIS — R51 Headache: Secondary | ICD-10-CM

## 2017-01-04 DIAGNOSIS — I10 Essential (primary) hypertension: Secondary | ICD-10-CM

## 2017-01-04 DIAGNOSIS — I2510 Atherosclerotic heart disease of native coronary artery without angina pectoris: Secondary | ICD-10-CM | POA: Diagnosis not present

## 2017-01-04 DIAGNOSIS — I779 Disorder of arteries and arterioles, unspecified: Secondary | ICD-10-CM

## 2017-01-04 DIAGNOSIS — I739 Peripheral vascular disease, unspecified: Secondary | ICD-10-CM

## 2017-01-04 DIAGNOSIS — R519 Headache, unspecified: Secondary | ICD-10-CM

## 2017-01-04 NOTE — Patient Instructions (Addendum)
Medication Instructions:  Continue all current medications.  Labwork: none  Testing/Procedures:  Your physician has requested that you have a carotid duplex. This test is an ultrasound of the carotid arteries in your neck. It looks at blood flow through these arteries that supply the brain with blood. Allow one hour for this exam. There are no restrictions or special instructions.  Office will contact with results via phone or letter.    Follow-Up: Your physician wants you to follow up in: 6 months.  You will receive a reminder letter in the mail one-two months in advance.  If you don't receive a letter, please call our office to schedule the follow up appointment   Any Other Special Instructions Will Be Listed Below (If Applicable).  If you need a refill on your cardiac medications before your next appointment, please call your pharmacy.  

## 2017-01-08 DIAGNOSIS — H353132 Nonexudative age-related macular degeneration, bilateral, intermediate dry stage: Secondary | ICD-10-CM | POA: Diagnosis not present

## 2017-01-08 DIAGNOSIS — E119 Type 2 diabetes mellitus without complications: Secondary | ICD-10-CM | POA: Diagnosis not present

## 2017-01-08 DIAGNOSIS — H43393 Other vitreous opacities, bilateral: Secondary | ICD-10-CM | POA: Diagnosis not present

## 2017-01-08 DIAGNOSIS — H3554 Dystrophies primarily involving the retinal pigment epithelium: Secondary | ICD-10-CM | POA: Diagnosis not present

## 2017-01-24 ENCOUNTER — Ambulatory Visit: Payer: Medicare Other

## 2017-01-24 DIAGNOSIS — I6523 Occlusion and stenosis of bilateral carotid arteries: Secondary | ICD-10-CM | POA: Diagnosis not present

## 2017-01-24 DIAGNOSIS — I779 Disorder of arteries and arterioles, unspecified: Secondary | ICD-10-CM

## 2017-01-24 DIAGNOSIS — I739 Peripheral vascular disease, unspecified: Principal | ICD-10-CM

## 2017-01-25 LAB — VAS US CAROTID
LCCAPSYS: 93 cm/s
LEFT ECA DIAS: -21 cm/s
LEFT VERTEBRAL DIAS: -13 cm/s
LICADDIAS: -22 cm/s
LICADSYS: -81 cm/s
Left CCA dist dias: -25 cm/s
Left CCA dist sys: -97 cm/s
Left CCA prox dias: 21 cm/s
Left ICA prox dias: -23 cm/s
Left ICA prox sys: -124 cm/s
RCCADSYS: -76 cm/s
RCCAPDIAS: 14 cm/s
RCCAPSYS: 94 cm/s
RIGHT ECA DIAS: -9 cm/s
RIGHT VERTEBRAL DIAS: -20 cm/s

## 2017-01-28 ENCOUNTER — Telehealth: Payer: Self-pay

## 2017-01-28 NOTE — Telephone Encounter (Signed)
-----   Message from [name], MD sent at 01/28/2017  8:47 AM EDT ----- Results reviewed. Overall stable findings, 40-59% RICA stenosis, and 4-15% LICA stenosis. Continue current medical therapy. A copy of this test should be forwarded to Glenda Chroman, MD.

## 2017-01-28 NOTE — Telephone Encounter (Signed)
PATIENT CONTACTED AT 1119. HOME NUMBER HAS BEEN DISCONNECTED AND UNABLE TO LEAVE MESSAGE ON CELL PHONE. LETTER MAILED WITH TEST RESULTS

## 2017-02-18 DIAGNOSIS — E785 Hyperlipidemia, unspecified: Secondary | ICD-10-CM | POA: Diagnosis not present

## 2017-02-18 DIAGNOSIS — I471 Supraventricular tachycardia: Secondary | ICD-10-CM | POA: Diagnosis not present

## 2017-02-18 DIAGNOSIS — K219 Gastro-esophageal reflux disease without esophagitis: Secondary | ICD-10-CM | POA: Diagnosis not present

## 2017-02-18 DIAGNOSIS — I10 Essential (primary) hypertension: Secondary | ICD-10-CM | POA: Diagnosis not present

## 2017-02-18 DIAGNOSIS — F419 Anxiety disorder, unspecified: Secondary | ICD-10-CM | POA: Diagnosis not present

## 2017-02-18 DIAGNOSIS — Z299 Encounter for prophylactic measures, unspecified: Secondary | ICD-10-CM | POA: Diagnosis not present

## 2017-02-18 DIAGNOSIS — H6120 Impacted cerumen, unspecified ear: Secondary | ICD-10-CM | POA: Diagnosis not present

## 2017-02-18 DIAGNOSIS — E1165 Type 2 diabetes mellitus with hyperglycemia: Secondary | ICD-10-CM | POA: Diagnosis not present

## 2017-04-09 DIAGNOSIS — Z299 Encounter for prophylactic measures, unspecified: Secondary | ICD-10-CM | POA: Diagnosis not present

## 2017-04-09 DIAGNOSIS — J069 Acute upper respiratory infection, unspecified: Secondary | ICD-10-CM | POA: Diagnosis not present

## 2017-04-09 DIAGNOSIS — I10 Essential (primary) hypertension: Secondary | ICD-10-CM | POA: Diagnosis not present

## 2017-04-09 DIAGNOSIS — Z789 Other specified health status: Secondary | ICD-10-CM | POA: Diagnosis not present

## 2017-04-09 DIAGNOSIS — Z6823 Body mass index (BMI) 23.0-23.9, adult: Secondary | ICD-10-CM | POA: Diagnosis not present

## 2017-04-23 DIAGNOSIS — H353132 Nonexudative age-related macular degeneration, bilateral, intermediate dry stage: Secondary | ICD-10-CM | POA: Diagnosis not present

## 2017-04-23 DIAGNOSIS — E119 Type 2 diabetes mellitus without complications: Secondary | ICD-10-CM | POA: Diagnosis not present

## 2017-04-23 DIAGNOSIS — H3554 Dystrophies primarily involving the retinal pigment epithelium: Secondary | ICD-10-CM | POA: Diagnosis not present

## 2017-04-23 DIAGNOSIS — H43393 Other vitreous opacities, bilateral: Secondary | ICD-10-CM | POA: Diagnosis not present

## 2017-04-23 DIAGNOSIS — H3570 Unspecified separation of retinal layers: Secondary | ICD-10-CM | POA: Diagnosis not present

## 2017-05-08 DIAGNOSIS — R5383 Other fatigue: Secondary | ICD-10-CM | POA: Diagnosis not present

## 2017-05-08 DIAGNOSIS — Z1389 Encounter for screening for other disorder: Secondary | ICD-10-CM | POA: Diagnosis not present

## 2017-05-08 DIAGNOSIS — I10 Essential (primary) hypertension: Secondary | ICD-10-CM | POA: Diagnosis not present

## 2017-05-08 DIAGNOSIS — Z0000 Encounter for general adult medical examination without abnormal findings: Secondary | ICD-10-CM | POA: Diagnosis not present

## 2017-05-08 DIAGNOSIS — Z6822 Body mass index (BMI) 22.0-22.9, adult: Secondary | ICD-10-CM | POA: Diagnosis not present

## 2017-05-08 DIAGNOSIS — K219 Gastro-esophageal reflux disease without esophagitis: Secondary | ICD-10-CM | POA: Diagnosis not present

## 2017-05-08 DIAGNOSIS — E1165 Type 2 diabetes mellitus with hyperglycemia: Secondary | ICD-10-CM | POA: Diagnosis not present

## 2017-05-08 DIAGNOSIS — Z7189 Other specified counseling: Secondary | ICD-10-CM | POA: Diagnosis not present

## 2017-05-08 DIAGNOSIS — Z1211 Encounter for screening for malignant neoplasm of colon: Secondary | ICD-10-CM | POA: Diagnosis not present

## 2017-05-08 DIAGNOSIS — E785 Hyperlipidemia, unspecified: Secondary | ICD-10-CM | POA: Diagnosis not present

## 2017-05-08 DIAGNOSIS — F419 Anxiety disorder, unspecified: Secondary | ICD-10-CM | POA: Diagnosis not present

## 2017-05-08 DIAGNOSIS — Z299 Encounter for prophylactic measures, unspecified: Secondary | ICD-10-CM | POA: Diagnosis not present

## 2017-05-09 DIAGNOSIS — F419 Anxiety disorder, unspecified: Secondary | ICD-10-CM | POA: Diagnosis not present

## 2017-05-09 DIAGNOSIS — E785 Hyperlipidemia, unspecified: Secondary | ICD-10-CM | POA: Diagnosis not present

## 2017-05-09 DIAGNOSIS — R5383 Other fatigue: Secondary | ICD-10-CM | POA: Diagnosis not present

## 2017-05-09 DIAGNOSIS — Z79899 Other long term (current) drug therapy: Secondary | ICD-10-CM | POA: Diagnosis not present

## 2017-06-07 ENCOUNTER — Other Ambulatory Visit: Payer: Self-pay | Admitting: *Deleted

## 2017-06-07 MED ORDER — ROSUVASTATIN CALCIUM 5 MG PO TABS: 5.0000 mg | ORAL_TABLET | Freq: Every day | ORAL | 3 refills | Status: DC

## 2017-06-11 ENCOUNTER — Other Ambulatory Visit: Payer: Self-pay | Admitting: *Deleted

## 2017-06-11 MED ORDER — ROSUVASTATIN CALCIUM 5 MG PO TABS: 5.0000 mg | ORAL_TABLET | Freq: Every day | ORAL | 3 refills | Status: DC

## 2017-06-20 ENCOUNTER — Other Ambulatory Visit: Payer: Self-pay

## 2017-06-20 MED ORDER — ROSUVASTATIN CALCIUM 5 MG PO TABS: 5.0000 mg | ORAL_TABLET | Freq: Every day | ORAL | 0 refills | Status: DC

## 2017-07-09 ENCOUNTER — Other Ambulatory Visit: Payer: Self-pay

## 2017-07-09 MED ORDER — ROSUVASTATIN CALCIUM 5 MG PO TABS: 5.0000 mg | ORAL_TABLET | Freq: Every day | ORAL | 0 refills | Status: DC

## 2017-08-05 ENCOUNTER — Encounter: Payer: Self-pay | Admitting: *Deleted

## 2017-08-05 ENCOUNTER — Ambulatory Visit (INDEPENDENT_AMBULATORY_CARE_PROVIDER_SITE_OTHER): Payer: Medicare Other | Admitting: Cardiology

## 2017-08-05 ENCOUNTER — Encounter: Payer: Self-pay | Admitting: Cardiology

## 2017-08-05 VITALS — BP 160/82 | HR 74 | Ht 62.0 in | Wt 122.0 lb

## 2017-08-05 DIAGNOSIS — I779 Disorder of arteries and arterioles, unspecified: Secondary | ICD-10-CM

## 2017-08-05 DIAGNOSIS — I2510 Atherosclerotic heart disease of native coronary artery without angina pectoris: Secondary | ICD-10-CM | POA: Diagnosis not present

## 2017-08-05 DIAGNOSIS — I10 Essential (primary) hypertension: Secondary | ICD-10-CM | POA: Diagnosis not present

## 2017-08-05 DIAGNOSIS — I739 Peripheral vascular disease, unspecified: Secondary | ICD-10-CM

## 2017-08-05 NOTE — Progress Notes (Signed)
.    Cardiology Office Note  Date: 08/05/2017   ID: [name], DOB [birth year], MRN [id number]  PCP: Glenda Chroman, MD  Primary Cardiologist: Rozann Lesches, MD   Chief Complaint  Patient presents with  . Hypertension    History of Present Illness: [name] is a 81 y.o. female last seen in February. She presents today for a routine follow-up visit. Reports no chest pain or palpitations. She has been bothered by back pain, states that it is intermittent back in between her shoulder blades, also left shoulder. It is more painful when she stands for periods of time raising the possibility of spinal stenosis. Has not had this evaluated by an orthopedist.  I personally reviewed her ECG today which shows sinus rhythm with possible left atrial enlargement and decreased R wave progression.  Current medications include aspirin, Norvasc, labetalol, clonidine, and Crestor.  We are requesting her interval lab work from Dr. Woody Seller.  Past Medical History:  Diagnosis Date  . Anemia   . Anxiety disorder   . Arthritis   . Chronic constipation   . Claudication [facility])    Chronic occlusion of the right limb of aortic stent graft.  . Diverticulosis    Left sided noted on colonoscopy 02/2006  . Ectopic atrial tachycardia ([place])   . Esophageal reflux disease   . Essential hypertension, benign   . Hiatal hernia   . Hyperlipidemia   . Lumbar radiculopathy   . Macular degeneration   . Migraines   . Non-obstructive CAD    a. Reportedly nonobstructive at catheterization in the 1990s;  b. 03/2014 Cath: LM nl, LAD 30-40p, 78m, D1 20ost, D2 50ost/p, D3 small, LCX nl/nondominant, OM 1/2/3 nl, RCA 20-30p, 81m, PDA nl, RPL1/2  small, nl.  . Penetrating atherosclerotic ulcer of aorta (HCC)    Complex ulcerated plaque of the infrarenal abdominal aorta mild renal artery stenosis on the right side, normal left renal artery catheterization 2009, status post aortic stent graft  . Renal artery stenosis  ([place])    a. Right renal artery stent April 2013 - Dr. Fletcher Anon;  b. 03/2014 PTA/stenting of RRA 2/2 ISR: 6x18 Herculink stent.  . Trigeminal neuralgia    Right  . Type 2 diabetes mellitus ([place])     Past Surgical History:  Procedure Laterality Date  . ABDOMINAL AORTAGRAM N/A 02/13/2012   Procedure: ABDOMINAL Maxcine Ham;  Surgeon: Wellington Hampshire, MD;  Location: [place] CATH LAB;  Service: Cardiovascular;  Laterality: N/A;  . ABDOMINAL AORTAGRAM N/A 03/17/2014   Procedure: ABDOMINAL Maxcine Ham;  Surgeon: Wellington Hampshire, MD;  Location: [place] CATH LAB;  Service: Cardiovascular;  Laterality: N/A;  . ABDOMINAL HYSTERECTOMY  1982  . BREAST BIOPSY Left 1990's  . CAROTID ENDARTERECTOMY Left 1993  . Endologix powerlink bifurcated     Covered stent graft  . LEFT HEART CATHETERIZATION WITH CORONARY ANGIOGRAM N/A 03/17/2014   Procedure: LEFT HEART CATHETERIZATION WITH CORONARY ANGIOGRAM;  Surgeon: Wellington Hampshire, MD;  Location: [place] CATH LAB;  Service: Cardiovascular;  Laterality: N/A;  . LUMBAR [place]  . PERCUTANEOUS STENT INTERVENTION Right 03/17/2014   Procedure: PERCUTANEOUS STENT INTERVENTION;  Surgeon: Wellington Hampshire, MD;  Location: [place] CATH LAB;  Service: Cardiovascular;  Laterality: Right;  Renal  . RENAL ANGIOGRAM N/A 02/13/2012   Procedure: RENAL ANGIOGRAM;  Surgeon: Wellington Hampshire, MD;  Location: [place] CATH LAB;  Service: Cardiovascular;  Laterality: N/A;  . RENAL ANGIOGRAM N/A 03/17/2014   Procedure: RENAL ANGIOGRAM;  Surgeon: Wellington Hampshire, MD;  Location: [facility] CATH LAB;  Service: Cardiovascular;  Laterality: N/A;  . TONSILLECTOMY AND ADENOIDECTOMY  1947    Current Outpatient Prescriptions  Medication Sig Dispense Refill  . acetaminophen (TYLENOL) 325 MG tablet Take 325 mg by mouth every 6 (six) hours as needed. For pain.    Marland Kitchen amLODipine (NORVASC) 2.5 MG tablet Take 1 tablet (2.5 mg total) by mouth 2 (two) times daily. 180 tablet 3  . aspirin EC 81 MG tablet Take 81 mg by mouth at bedtime.       . Cholecalciferol (VITAMIN D3) 1000 UNITS CAPS Take 1,000 capsules by mouth daily.    . clonazePAM (KLONOPIN) 0.5 MG tablet Take 0.125 mg by mouth at bedtime as needed for anxiety.     . cloNIDine (CATAPRES) 0.1 MG tablet Take 0.1 mg by mouth 2 (two) times daily as needed (SBP>170). Reported on 02/15/2016    . labetalol (NORMODYNE) 200 MG tablet Take 1 tablet (200 mg total) by mouth 2 (two) times daily. 180 tablet 3  . Multiple Vitamins-Minerals (PRESERVISION/LUTEIN) CAPS Take 1 capsule by mouth 2 (two) times daily.     . polyethylene glycol (MIRALAX / GLYCOLAX) packet Take 17 g by mouth daily as needed for mild constipation. Constipation    . rosuvastatin (CRESTOR) 5 MG tablet Take 1 tablet (5 mg total) by mouth daily. 90 tablet 0   No current facility-administered medications for this visit.    Allergies:  Cefixime; Conjugated estrogens; Morphine and related; Codeine; Iohexol; and Adenosine   Social History: The patient  reports that she quit smoking about 37 years ago. Her smoking use included Cigarettes. She has a 0.20 pack-year smoking history. She has never used smokeless tobacco. She reports that she does not drink alcohol or use drugs.   ROS:  Please see the history of present illness. Otherwise, complete review of systems is positive for none.  All other systems are reviewed and negative.   Physical Exam: VS:  BP (!) 160/82   Pulse 74   Ht 5\' 2"  (1.575 m)   Wt 122 lb (55.3 kg)   SpO2 97%   BMI 22.31 kg/m , BMI Body mass index is 22.31 kg/m.  Wt Readings from Last 3 Encounters:  08/05/17 122 lb (55.3 kg)  01/04/17 124 lb (56.2 kg)  06/29/16 116 lb (52.6 kg)    General: Elderly woman, appears comfortable at rest. HEENT: Conjunctiva and lids normal, oropharynx clear. Neck: Supple, no elevated JVP or carotid bruits, no thyromegaly. Lungs: Clear to auscultation, nonlabored breathing at rest. Cardiac: Regular rate and rhythm, no S3 or significant systolic murmur, no pericardial  rub. Abdomen: Soft, nontender, bowel sounds present, no guarding or rebound. Extremities: No pitting edema, distal pulses 2+. Skin: Warm and dry. Musculoskeletal: No kyphosis. Neuropsychiatric: Alert and oriented x3, affect grossly appropriate.  ECG: I personally reviewed the tracing from 02/29/2016 which showed sinus rhythm with decreased R wave progression and nonspecific ST changes.  Recent Labwork:  April 2017: Potassium 3.9, BUN 17, creatinine 0.59, hemoglobin 12.7, platelets 179.  Other Studies Reviewed Today:  Echocardiogram 03/09/2015: Study Conclusions  - Left ventricle: The cavity size was normal. Systolic function was   normal. The estimated ejection fraction was in the range of 55%   to 60%. Wall motion was normal; there were no regional wall   motion abnormalities. Doppler parameters are consistent with   abnormal left ventricular relaxation (grade 1 diastolic   dysfunction). Doppler parameters are consistent with high  ventricular filling pressure. Mild focal basal septal   hypertrophy. - Aortic valve: Mildly calcified annulus. Trileaflet; mildly   thickened leaflets. - Mitral valve: Mild to moderately thickened leaflets . There was   trivial regurgitation. - Left atrium: The atrium was mildly dilated. - Tricuspid valve: There was mild-moderate regurgitation. - Pulmonic valve: There was mild regurgitation.  Carotid Dopplers 01/24/2017: Stable 40-59% RICA stenosis and 7-91% LICA stenosis.  Assessment and Plan:  1. Essential hypertension, blood pressure mildly elevated today. She reports compliance with her medications. Also discussed diet area keep follow-up with Dr. Woody Seller.  2. History of nonobstructive CAD. She reports no angina symptoms. ECG reviewed and stable. Continues on aspirin and statin therapy.  3. Carotid artery disease, Dopplers from March showed mild to moderate atherosclerosis. A symptomatic.  Current medicines were reviewed with the patient  today.   Orders Placed This Encounter  Procedures  . EKG 12-Lead    Disposition: Follow-up in 6 months.  Signed, [name], MD, [facility] 08/05/2017 4:47 PM    [facility] at [place] [address], [place], [place] [address] Phone: [phone]; Fax: [phone]

## 2017-08-05 NOTE — Patient Instructions (Signed)

## 2017-08-06 ENCOUNTER — Encounter: Payer: Self-pay | Admitting: Cardiology

## 2017-08-07 DIAGNOSIS — Z23 Encounter for immunization: Secondary | ICD-10-CM | POA: Diagnosis not present

## 2017-08-19 ENCOUNTER — Other Ambulatory Visit: Payer: Self-pay | Admitting: Internal Medicine

## 2017-08-19 DIAGNOSIS — Z1239 Encounter for other screening for malignant neoplasm of breast: Secondary | ICD-10-CM

## 2017-09-04 ENCOUNTER — Ambulatory Visit
Admission: RE | Admit: 2017-09-04 | Discharge: 2017-09-04 | Disposition: A | Discharge status: Home / Self Care | Payer: Medicare Other | Source: Ambulatory Visit | Attending: Internal Medicine | Admitting: Internal Medicine

## 2017-09-04 DIAGNOSIS — Z1231 Encounter for screening mammogram for malignant neoplasm of breast: Secondary | ICD-10-CM | POA: Diagnosis not present

## 2017-09-04 DIAGNOSIS — Z1239 Encounter for other screening for malignant neoplasm of breast: Secondary | ICD-10-CM

## 2017-09-09 DIAGNOSIS — I471 Supraventricular tachycardia: Secondary | ICD-10-CM | POA: Diagnosis not present

## 2017-09-09 DIAGNOSIS — Z6822 Body mass index (BMI) 22.0-22.9, adult: Secondary | ICD-10-CM | POA: Diagnosis not present

## 2017-09-09 DIAGNOSIS — M169 Osteoarthritis of hip, unspecified: Secondary | ICD-10-CM | POA: Diagnosis not present

## 2017-09-09 DIAGNOSIS — F419 Anxiety disorder, unspecified: Secondary | ICD-10-CM | POA: Diagnosis not present

## 2017-09-09 DIAGNOSIS — I10 Essential (primary) hypertension: Secondary | ICD-10-CM | POA: Diagnosis not present

## 2017-09-09 DIAGNOSIS — E1165 Type 2 diabetes mellitus with hyperglycemia: Secondary | ICD-10-CM | POA: Diagnosis not present

## 2017-09-09 DIAGNOSIS — Z299 Encounter for prophylactic measures, unspecified: Secondary | ICD-10-CM | POA: Diagnosis not present

## 2017-09-09 DIAGNOSIS — E7800 Pure hypercholesterolemia, unspecified: Secondary | ICD-10-CM | POA: Diagnosis not present

## 2017-09-20 ENCOUNTER — Other Ambulatory Visit: Payer: Self-pay | Admitting: Cardiology

## 2017-09-20 MED ORDER — ROSUVASTATIN CALCIUM 5 MG PO TABS: 5.0000 mg | ORAL_TABLET | Freq: Every day | ORAL | 0 refills | Status: DC

## 2017-09-20 NOTE — Telephone Encounter (Signed)
Medication sent to pharmacy  

## 2017-09-20 NOTE — Telephone Encounter (Signed)
Patient needs refill on (CRESTOR) 5 MG tablet Please send to [facility].

## 2017-11-19 DIAGNOSIS — H3554 Dystrophies primarily involving the retinal pigment epithelium: Secondary | ICD-10-CM | POA: Diagnosis not present

## 2017-11-19 DIAGNOSIS — E119 Type 2 diabetes mellitus without complications: Secondary | ICD-10-CM | POA: Diagnosis not present

## 2017-11-19 DIAGNOSIS — H43393 Other vitreous opacities, bilateral: Secondary | ICD-10-CM | POA: Diagnosis not present

## 2017-11-19 DIAGNOSIS — H353132 Nonexudative age-related macular degeneration, bilateral, intermediate dry stage: Secondary | ICD-10-CM | POA: Diagnosis not present

## 2017-11-25 ENCOUNTER — Other Ambulatory Visit: Payer: Self-pay | Admitting: Cardiology

## 2017-12-24 IMAGING — DX DG CHEST 2V
2 series · 2 of 2 positions shown · non-contrast
Comparison: Chest x-ray 02/24/2016.

CLINICAL DATA: 79-year-old female with history of weakness for the
past few weeks headache for 1 month.

EXAM:
CHEST  2 VIEW

[chest pa]
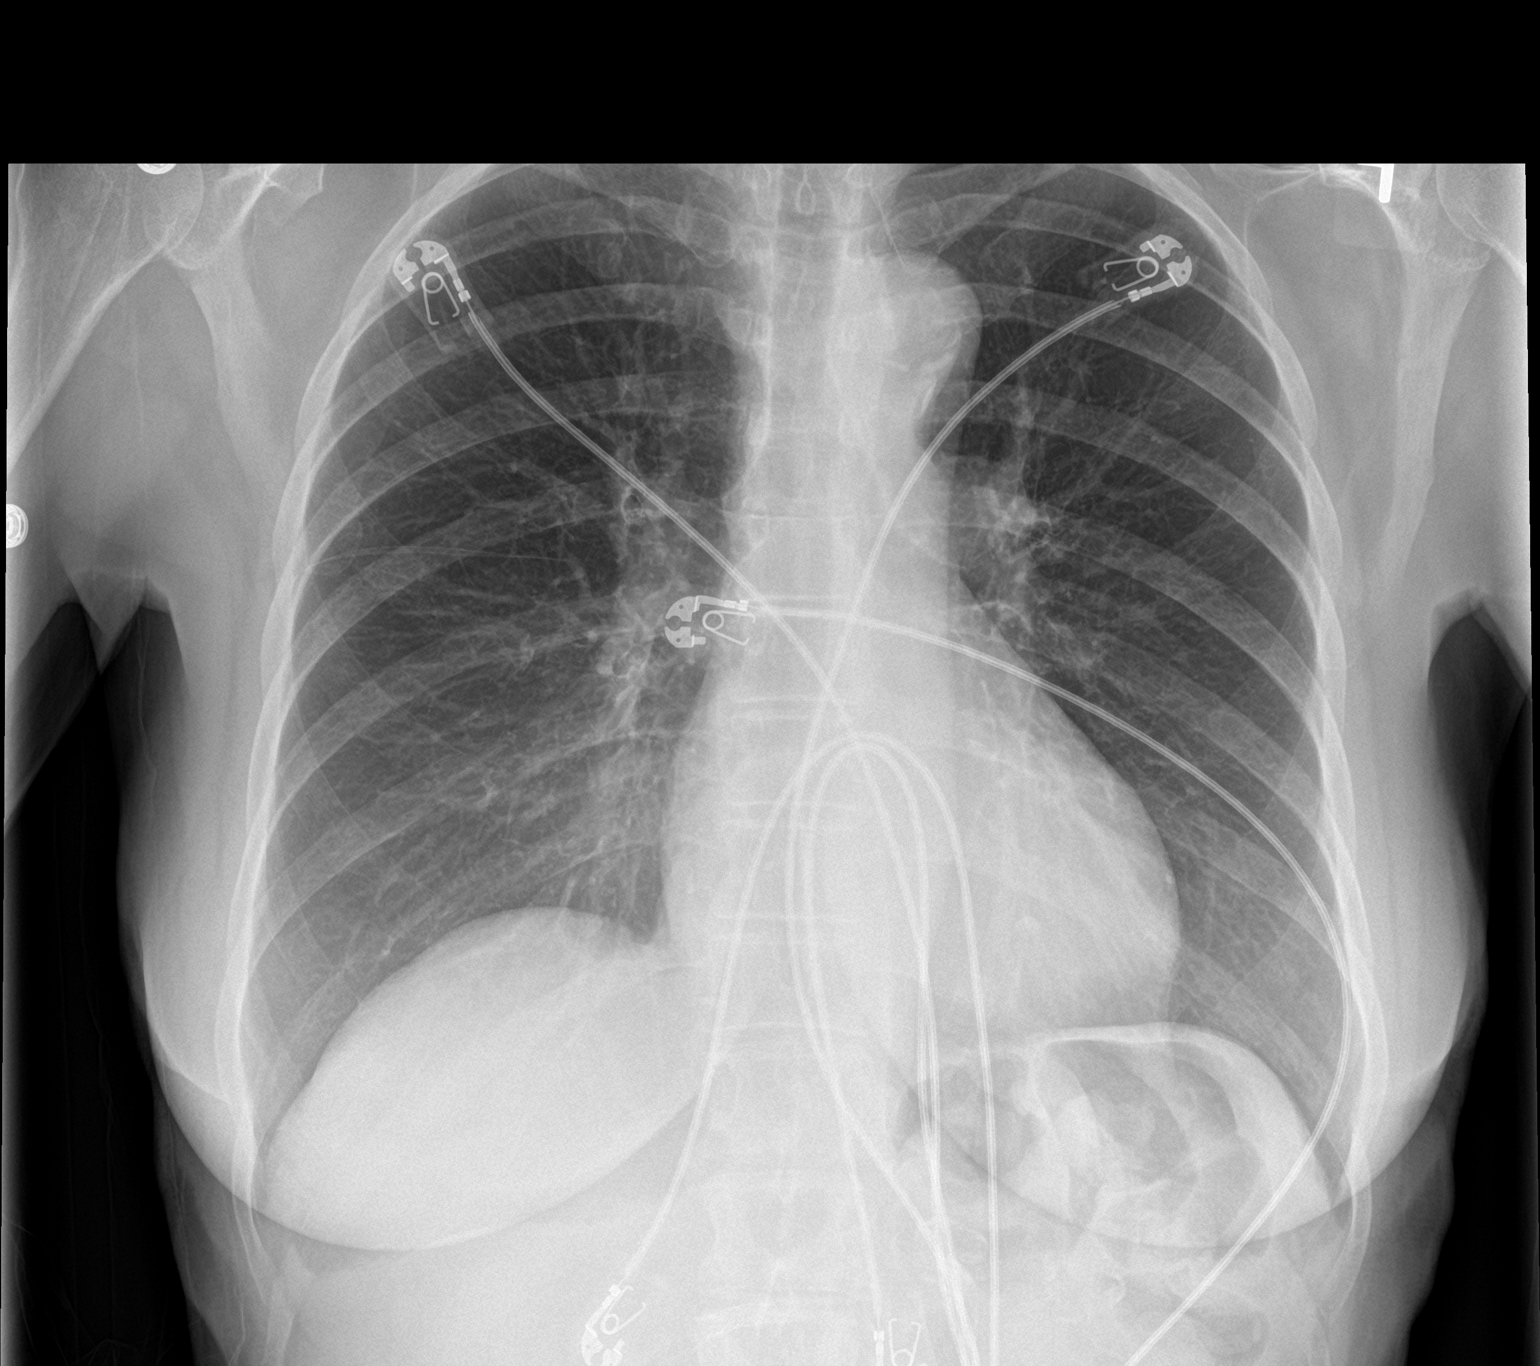

[chest lat]
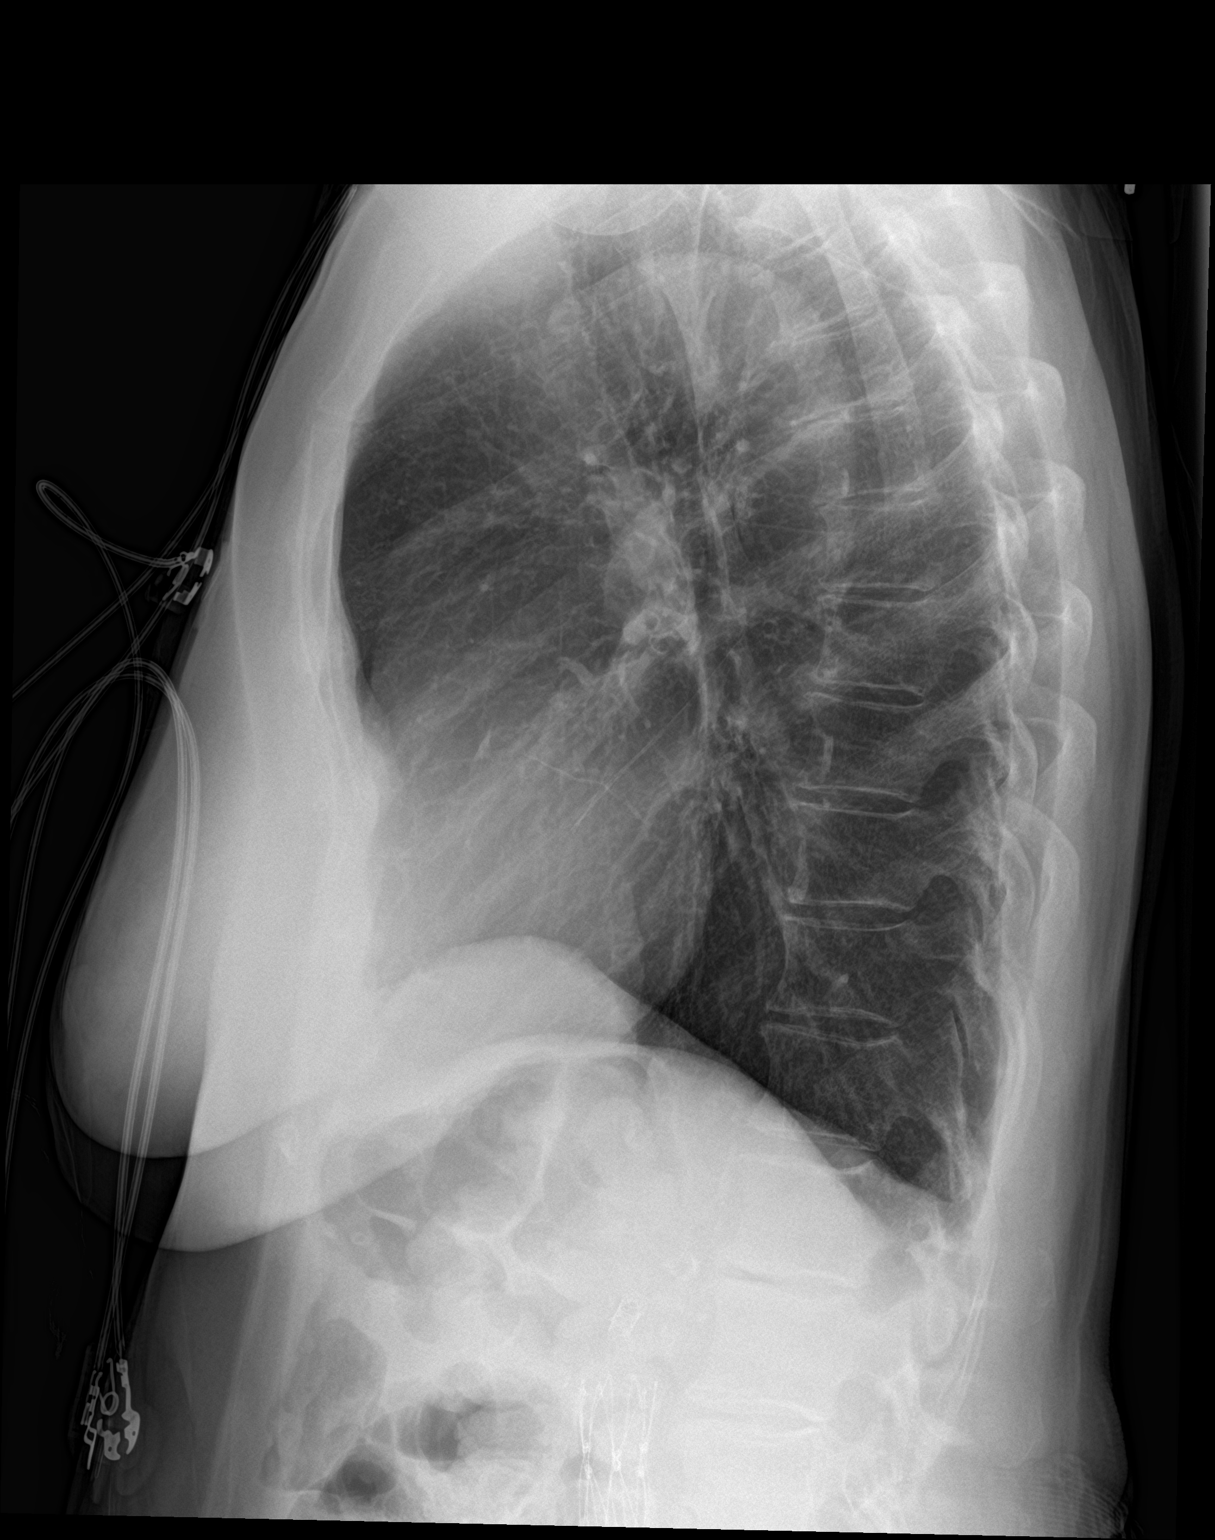

[2 of 2 positions shown; findings below may reference images not displayed]

FINDINGS: Lung volumes are normal. No consolidative airspace disease. No
pleural effusions. No pneumothorax. No pulmonary nodule or mass
noted. Pulmonary vasculature and the cardiomediastinal silhouette
are within normal limits. Atherosclerosis in the thoracic aorta.
IMPRESSION: 1.  No radiographic evidence of acute cardiopulmonary disease.
2. Atherosclerosis.

## 2018-01-09 ENCOUNTER — Telehealth: Payer: Self-pay | Admitting: Cardiology

## 2018-01-09 DIAGNOSIS — K219 Gastro-esophageal reflux disease without esophagitis: Secondary | ICD-10-CM | POA: Diagnosis not present

## 2018-01-09 DIAGNOSIS — E7800 Pure hypercholesterolemia, unspecified: Secondary | ICD-10-CM | POA: Diagnosis not present

## 2018-01-09 DIAGNOSIS — J309 Allergic rhinitis, unspecified: Secondary | ICD-10-CM | POA: Diagnosis not present

## 2018-01-09 DIAGNOSIS — I10 Essential (primary) hypertension: Secondary | ICD-10-CM | POA: Diagnosis not present

## 2018-01-09 DIAGNOSIS — Z299 Encounter for prophylactic measures, unspecified: Secondary | ICD-10-CM | POA: Diagnosis not present

## 2018-01-09 DIAGNOSIS — R51 Headache: Secondary | ICD-10-CM | POA: Diagnosis not present

## 2018-01-09 DIAGNOSIS — E1165 Type 2 diabetes mellitus with hyperglycemia: Secondary | ICD-10-CM | POA: Diagnosis not present

## 2018-01-09 DIAGNOSIS — I6529 Occlusion and stenosis of unspecified carotid artery: Secondary | ICD-10-CM | POA: Diagnosis not present

## 2018-01-09 DIAGNOSIS — Z6823 Body mass index (BMI) 23.0-23.9, adult: Secondary | ICD-10-CM | POA: Diagnosis not present

## 2018-01-09 MED ORDER — AMLODIPINE BESYLATE 2.5 MG PO TABS: 2.5000 mg | ORAL_TABLET | Freq: Two times a day (BID) | ORAL | 0 refills | Status: DC

## 2018-01-09 NOTE — Telephone Encounter (Signed)
Pt says she was mistakenly taking 5 mg bid of amlodipine - says she has ran out - pt made aware that per LOV she should be taking 2.5 mg bid and that she could take 1 tablet of the 5 mg tablets she has now until she is seen for f/u on 3/29 with Dr Domenic Polite - pt voiced understanding - sent amlodipine 2.5 mg bid to [facility] as requested 30 days supply

## 2018-01-09 NOTE — Telephone Encounter (Signed)
Patient called stating that she needs RX for amLODipine (NORVASC) 2.5 MG tablet  She is concerned stating that she is taking 5 mg.    Please advise on correct dosage.   # [id number].

## 2018-01-20 ENCOUNTER — Telehealth: Payer: Self-pay | Admitting: *Deleted

## 2018-01-22 NOTE — Telephone Encounter (Signed)
Opened in error

## 2018-01-31 ENCOUNTER — Encounter: Payer: Self-pay | Admitting: Cardiology

## 2018-01-31 ENCOUNTER — Other Ambulatory Visit: Payer: Self-pay | Admitting: Cardiology

## 2018-01-31 NOTE — Progress Notes (Signed)
Cardiology Office Note  Date: 02/03/2018   ID: [name], DOB 1936-07-10, MRN [id number]  PCP: Glenda Chroman, MD  Primary Cardiologist: Rozann Lesches, MD   Chief Complaint  Patient presents with  . Hypertension    History of Present Illness: [name] is a 82 y.o. female last seen in September 2018.  She presents for a follow-up visit.  Reports no chest pain or shortness of breath.  She does have intermittent headaches and trouble with insomnia.  I reviewed her medications.  She has been taking Norvasc at 5 mg twice daily, labetalol at 100 mg 3 times a day.  She has been somewhat dizzy and states that her blood pressure gets lower than normal when she takes her medications together.  Carotid Dopplers from March 2018 are outlined below.  She had renal artery Dopplers back in 2017.  Past Medical History:  Diagnosis Date  . Anemia   . Anxiety disorder   . Arthritis   . Chronic constipation   . Claudication [facility])    Chronic occlusion of the right limb of aortic stent graft.  . Diverticulosis    Left sided noted on colonoscopy 02/2006  . Ectopic atrial tachycardia ([place])   . Esophageal reflux disease   . Essential hypertension   . Hiatal hernia   . Hyperlipidemia   . Lumbar radiculopathy   . Macular degeneration   . Migraines   . Non-obstructive CAD    a. Reportedly nonobstructive at catheterization in the 1990s;  b. 03/2014 Cath: LM nl, LAD 30-40p, 78m, D1 20ost, D2 50ost/p, D3 small, LCX nl/nondominant, OM 1/2/3 nl, RCA 20-30p, 22m, PDA nl, RPL1/2  small, nl.  . Penetrating atherosclerotic ulcer of aorta (HCC)    Complex ulcerated plaque of the infrarenal abdominal aorta mild renal artery stenosis on the right side, normal left renal artery catheterization 2009, status post aortic stent graft  . Renal artery stenosis ([place])    a. Right renal artery stent April 2013 - Dr. Fletcher Anon;  b. 03/2014 PTA/stenting of RRA 2/2 ISR: 6x18 Herculink stent.  . Trigeminal neuralgia     Right  . Type 2 diabetes mellitus ([place])     Past Surgical History:  Procedure Laterality Date  . ABDOMINAL AORTAGRAM N/A 02/13/2012   Procedure: ABDOMINAL Maxcine Ham;  Surgeon: Wellington Hampshire, MD;  Location: [place] CATH LAB;  Service: Cardiovascular;  Laterality: N/A;  . ABDOMINAL AORTAGRAM N/A 03/17/2014   Procedure: ABDOMINAL Maxcine Ham;  Surgeon: Wellington Hampshire, MD;  Location: [place] CATH LAB;  Service: Cardiovascular;  Laterality: N/A;  . ABDOMINAL HYSTERECTOMY  1982  . BREAST BIOPSY Left 1990's  . CAROTID ENDARTERECTOMY Left 1993  . Endologix powerlink bifurcated     Covered stent graft  . LEFT HEART CATHETERIZATION WITH CORONARY ANGIOGRAM N/A 03/17/2014   Procedure: LEFT HEART CATHETERIZATION WITH CORONARY ANGIOGRAM;  Surgeon: Wellington Hampshire, MD;  Location: [place] CATH LAB;  Service: Cardiovascular;  Laterality: N/A;  . LUMBAR [place]  . PERCUTANEOUS STENT INTERVENTION Right 03/17/2014   Procedure: PERCUTANEOUS STENT INTERVENTION;  Surgeon: Wellington Hampshire, MD;  Location: [place] CATH LAB;  Service: Cardiovascular;  Laterality: Right;  Renal  . RENAL ANGIOGRAM N/A 02/13/2012   Procedure: RENAL ANGIOGRAM;  Surgeon: Wellington Hampshire, MD;  Location: [place] CATH LAB;  Service: Cardiovascular;  Laterality: N/A;  . RENAL ANGIOGRAM N/A 03/17/2014   Procedure: RENAL ANGIOGRAM;  Surgeon: Wellington Hampshire, MD;  Location: [place] CATH LAB;  Service: Cardiovascular;  Laterality: N/A;  . TONSILLECTOMY AND ADENOIDECTOMY  1947    Current Outpatient Medications  Medication Sig Dispense Refill  . acetaminophen (TYLENOL) 325 MG tablet Take 325 mg by mouth every 6 (six) hours as needed. For pain.    Marland Kitchen amLODipine (NORVASC) 2.5 MG tablet Take 1 tablet (2.5 mg total) by mouth 2 (two) times daily. 60 tablet 0  . aspirin EC 81 MG tablet Take 81 mg by mouth at bedtime.     . Cholecalciferol (VITAMIN D3) 1000 UNITS CAPS Take 1,000 capsules by mouth daily.    . clonazePAM (KLONOPIN) 0.5 MG tablet Take 0.125 mg by mouth at  bedtime as needed for anxiety.     . cloNIDine (CATAPRES) 0.1 MG tablet Take 0.1 mg by mouth 2 (two) times daily as needed (SBP>170). Reported on 02/15/2016    . labetalol (NORMODYNE) 200 MG tablet Take 1 tablet (200 mg total) by mouth 2 (two) times daily. 180 tablet 3  . Multiple Vitamins-Minerals (PRESERVISION/LUTEIN) CAPS Take 1 capsule by mouth 2 (two) times daily.     . polyethylene glycol (MIRALAX / GLYCOLAX) packet Take 17 g by mouth daily as needed for mild constipation. Constipation    . rosuvastatin (CRESTOR) 5 MG tablet TAKE 1 TABLET DAILY 90 tablet 0   No current facility-administered medications for this visit.    Allergies:  Cefixime; Conjugated estrogens; Morphine and related; Codeine; Iohexol; and Adenosine   Social History: The patient  reports that she quit smoking about 38 years ago. Her smoking use included cigarettes. She has a 0.20 pack-year smoking history. She has never used smokeless tobacco. She reports that she does not drink alcohol or use drugs.   ROS:  Please see the history of present illness. Otherwise, complete review of systems is positive for anxiety.  All other systems are reviewed and negative.   Physical Exam: VS:  BP (!) 184/88   Pulse 77   Ht 5\' 2"  (1.575 m)   Wt 124 lb (56.2 kg)   SpO2 97%   BMI 22.68 kg/m , BMI Body mass index is 22.68 kg/m.  Wt Readings from Last 3 Encounters:  02/03/18 124 lb (56.2 kg)  08/05/17 122 lb (55.3 kg)  01/04/17 124 lb (56.2 kg)    General: Patient appears comfortable at rest. HEENT: Conjunctiva and lids normal, oropharynx clear. Neck: Supple, no elevated JVP or carotid bruits, no thyromegaly. Lungs: Clear to auscultation, nonlabored breathing at rest. Cardiac: Regular rate and rhythm, no S3 or significant systolic murmur, no pericardial rub. Abdomen: Soft, nontender, bowel sounds present. Extremities: No pitting edema, distal pulses 2+. Skin: Warm and dry. Musculoskeletal: No kyphosis. Neuropsychiatric: Alert  and oriented x3, affect grossly appropriate.  ECG: I personally reviewed the tracing from9/24/2018 which showed sinus rhythm with poor R wave progression and left atrial enlargement.  Recent Labwork:  June 2018: Hgb 12.5, platelets 188, TSH 2.91, cholesterol 164, TG 78, HDL 56, LDL 92, BUN 19, creatinine 0.85, potassium 4.5, AST 18, ALT 14  Other Studies Reviewed Today:  Echocardiogram 03/09/2015: Study Conclusions  - Left ventricle: The cavity size was normal. Systolic function was normal. The estimated ejection fraction was in the range of 55% to 60%. Wall motion was normal; there were no regional wall motion abnormalities. Doppler parameters are consistent with abnormal left ventricular relaxation (grade 1 diastolic dysfunction). Doppler parameters are consistent with high ventricular filling pressure. Mild focal basal septal hypertrophy. - Aortic valve: Mildly calcified annulus. Trileaflet; mildly thickened leaflets. - Mitral valve: Mild  to moderately thickened leaflets . There was trivial regurgitation. - Left atrium: The atrium was mildly dilated. - Tricuspid valve: There was mild-moderate regurgitation. - Pulmonic valve: There was mild regurgitation.  Carotid Dopplers 01/24/2017: Stable 40-59% RICA stenosis and 3-97% LICA stenosis.  Assessment and Plan:  1.  Essential hypertension.  Plan is to change Norvasc to 2.5 mg twice daily and labetalol to 200 mg twice daily.  She will separate these 2 medication dose times by about 2 hours if possible.  No other changes made today.  2.  Nonobstructive CAD by history.  She continues on aspirin and statin therapy.  No angina.  3.  Carotid artery disease, mild to moderate by carotid Dopplers last year.  Updated study will be obtained.  4.  History of renal artery stenosis status post stent interventions to the right renal artery in 2013 as well as 2015.  Follow-up renal artery Dopplers will be obtained.  Current  medicines were reviewed with the patient today.  Disposition: Follow-up in 6 months in the [place] office.  Signed, [name], MD, [facility] 02/03/2018 1:49 PM    [facility] at [facility] [address], [place], [place] [address] Phone: [phone]; Fax: [phone]

## 2018-02-03 ENCOUNTER — Encounter: Payer: Self-pay | Admitting: Cardiology

## 2018-02-03 ENCOUNTER — Ambulatory Visit (INDEPENDENT_AMBULATORY_CARE_PROVIDER_SITE_OTHER): Payer: Medicare Other | Admitting: Cardiology

## 2018-02-03 VITALS — BP 184/88 | HR 77 | Ht 62.0 in | Wt 124.0 lb

## 2018-02-03 DIAGNOSIS — I6523 Occlusion and stenosis of bilateral carotid arteries: Secondary | ICD-10-CM | POA: Diagnosis not present

## 2018-02-03 DIAGNOSIS — I10 Essential (primary) hypertension: Secondary | ICD-10-CM

## 2018-02-03 DIAGNOSIS — I2510 Atherosclerotic heart disease of native coronary artery without angina pectoris: Secondary | ICD-10-CM | POA: Diagnosis not present

## 2018-02-03 DIAGNOSIS — Z8679 Personal history of other diseases of the circulatory system: Secondary | ICD-10-CM | POA: Diagnosis not present

## 2018-02-03 NOTE — Patient Instructions (Addendum)
Your physician wants you to follow-up in:6 months with Dr.McDowell You will receive a reminder letter in the mail two months in advance. If you don't receive a letter, please call our office to schedule the follow-up appointment.     Take Norvasc 2.5 mg twice a day at   8 am and dinner time   Take Labetalol 200 mg twice a day  at 10:30 am and bedtime   Your physician has requested that you have a renal artery duplex. During this test, an ultrasound is used to evaluate blood flow to the kidneys. Allow one hour for this exam. Do not eat after midnight the day before and avoid carbonated beverages. Take your medications as you usually do.  Your physician has requested that you have a carotid duplex. This test is an ultrasound of the carotid arteries in your neck. It looks at blood flow through these arteries that supply the brain with blood. Allow one hour for this exam. There are no restrictions or special instructions.     If you need a refill on your cardiac medications before your next appointment, please call your pharmacy.         Thank you for choosing [facility] !

## 2018-02-07 ENCOUNTER — Ambulatory Visit: Payer: Medicare Other | Admitting: Cardiology

## 2018-02-27 ENCOUNTER — Ambulatory Visit (INDEPENDENT_AMBULATORY_CARE_PROVIDER_SITE_OTHER): Payer: Medicare Other

## 2018-02-27 DIAGNOSIS — I10 Essential (primary) hypertension: Secondary | ICD-10-CM | POA: Diagnosis not present

## 2018-02-27 DIAGNOSIS — I6523 Occlusion and stenosis of bilateral carotid arteries: Secondary | ICD-10-CM

## 2018-02-27 DIAGNOSIS — Z8679 Personal history of other diseases of the circulatory system: Secondary | ICD-10-CM | POA: Diagnosis not present

## 2018-02-28 ENCOUNTER — Telehealth: Payer: Self-pay | Admitting: *Deleted

## 2018-02-28 NOTE — Telephone Encounter (Signed)
-----   Message from [name], MD sent at 02/28/2018 10:37 AM EDT ----- Results reviewed.  Mild bilateral ICA stenoses of 1-39%.  Continue medical therapy. A copy of this test should be forwarded to Glenda Chroman, MD.

## 2018-02-28 NOTE — Telephone Encounter (Signed)
-----   Message from [name], MD sent at 02/28/2018 10:36 AM EDT ----- Results reviewed.  Study suggests mild stenosis of the right renal artery, but would not expect this to be affecting blood pressure necessarily.  Continue with current plan. A copy of this test should be forwarded to Glenda Chroman, MD.

## 2018-02-28 NOTE — Telephone Encounter (Signed)
Patient informed and copy sent to PCP. 

## 2018-03-31 ENCOUNTER — Telehealth: Payer: Self-pay | Admitting: Cardiology

## 2018-03-31 MED ORDER — AMLODIPINE BESYLATE 2.5 MG PO TABS: 2.5000 mg | ORAL_TABLET | Freq: Two times a day (BID) | ORAL | 3 refills | Status: DC

## 2018-03-31 NOTE — Telephone Encounter (Signed)
amLODipine (NORVASC) 2.5 MG tablet   Does not want to have to split pill   [facility] mail order

## 2018-03-31 NOTE — Telephone Encounter (Signed)
Done

## 2018-04-04 ENCOUNTER — Other Ambulatory Visit: Payer: Self-pay | Admitting: *Deleted

## 2018-04-04 DIAGNOSIS — I701 Atherosclerosis of renal artery: Secondary | ICD-10-CM

## 2018-05-01 ENCOUNTER — Other Ambulatory Visit: Payer: Self-pay | Admitting: Cardiology

## 2018-05-09 DIAGNOSIS — Z6823 Body mass index (BMI) 23.0-23.9, adult: Secondary | ICD-10-CM | POA: Diagnosis not present

## 2018-05-09 DIAGNOSIS — E7800 Pure hypercholesterolemia, unspecified: Secondary | ICD-10-CM | POA: Diagnosis not present

## 2018-05-09 DIAGNOSIS — I10 Essential (primary) hypertension: Secondary | ICD-10-CM | POA: Diagnosis not present

## 2018-05-09 DIAGNOSIS — M25552 Pain in left hip: Secondary | ICD-10-CM | POA: Diagnosis not present

## 2018-05-09 DIAGNOSIS — I6529 Occlusion and stenosis of unspecified carotid artery: Secondary | ICD-10-CM | POA: Diagnosis not present

## 2018-05-09 DIAGNOSIS — E1165 Type 2 diabetes mellitus with hyperglycemia: Secondary | ICD-10-CM | POA: Diagnosis not present

## 2018-05-09 DIAGNOSIS — Z299 Encounter for prophylactic measures, unspecified: Secondary | ICD-10-CM | POA: Diagnosis not present

## 2018-05-09 DIAGNOSIS — M546 Pain in thoracic spine: Secondary | ICD-10-CM | POA: Diagnosis not present

## 2018-05-12 DIAGNOSIS — Z1331 Encounter for screening for depression: Secondary | ICD-10-CM | POA: Diagnosis not present

## 2018-05-12 DIAGNOSIS — Z1211 Encounter for screening for malignant neoplasm of colon: Secondary | ICD-10-CM | POA: Diagnosis not present

## 2018-05-12 DIAGNOSIS — Z0000 Encounter for general adult medical examination without abnormal findings: Secondary | ICD-10-CM | POA: Diagnosis not present

## 2018-05-12 DIAGNOSIS — Z299 Encounter for prophylactic measures, unspecified: Secondary | ICD-10-CM | POA: Diagnosis not present

## 2018-05-12 DIAGNOSIS — I10 Essential (primary) hypertension: Secondary | ICD-10-CM | POA: Diagnosis not present

## 2018-05-12 DIAGNOSIS — Z7189 Other specified counseling: Secondary | ICD-10-CM | POA: Diagnosis not present

## 2018-05-12 DIAGNOSIS — Z1339 Encounter for screening examination for other mental health and behavioral disorders: Secondary | ICD-10-CM | POA: Diagnosis not present

## 2018-05-12 DIAGNOSIS — Z6823 Body mass index (BMI) 23.0-23.9, adult: Secondary | ICD-10-CM | POA: Diagnosis not present

## 2018-05-13 DIAGNOSIS — R5383 Other fatigue: Secondary | ICD-10-CM | POA: Diagnosis not present

## 2018-05-13 DIAGNOSIS — E7800 Pure hypercholesterolemia, unspecified: Secondary | ICD-10-CM | POA: Diagnosis not present

## 2018-05-13 DIAGNOSIS — Z79899 Other long term (current) drug therapy: Secondary | ICD-10-CM | POA: Diagnosis not present

## 2018-05-19 DIAGNOSIS — H3570 Unspecified separation of retinal layers: Secondary | ICD-10-CM | POA: Diagnosis not present

## 2018-05-19 DIAGNOSIS — E119 Type 2 diabetes mellitus without complications: Secondary | ICD-10-CM | POA: Diagnosis not present

## 2018-05-19 DIAGNOSIS — H353132 Nonexudative age-related macular degeneration, bilateral, intermediate dry stage: Secondary | ICD-10-CM | POA: Diagnosis not present

## 2018-05-19 DIAGNOSIS — H43811 Vitreous degeneration, right eye: Secondary | ICD-10-CM | POA: Diagnosis not present

## 2018-05-19 DIAGNOSIS — H43393 Other vitreous opacities, bilateral: Secondary | ICD-10-CM | POA: Diagnosis not present

## 2018-05-19 DIAGNOSIS — H3554 Dystrophies primarily involving the retinal pigment epithelium: Secondary | ICD-10-CM | POA: Diagnosis not present

## 2018-06-09 DIAGNOSIS — Z299 Encounter for prophylactic measures, unspecified: Secondary | ICD-10-CM | POA: Diagnosis not present

## 2018-06-09 DIAGNOSIS — R109 Unspecified abdominal pain: Secondary | ICD-10-CM | POA: Diagnosis not present

## 2018-06-09 DIAGNOSIS — K5900 Constipation, unspecified: Secondary | ICD-10-CM | POA: Diagnosis not present

## 2018-06-09 DIAGNOSIS — I10 Essential (primary) hypertension: Secondary | ICD-10-CM | POA: Diagnosis not present

## 2018-06-09 DIAGNOSIS — M546 Pain in thoracic spine: Secondary | ICD-10-CM | POA: Diagnosis not present

## 2018-06-09 DIAGNOSIS — Z6823 Body mass index (BMI) 23.0-23.9, adult: Secondary | ICD-10-CM | POA: Diagnosis not present

## 2018-06-09 DIAGNOSIS — E1165 Type 2 diabetes mellitus with hyperglycemia: Secondary | ICD-10-CM | POA: Diagnosis not present

## 2018-06-09 DIAGNOSIS — R110 Nausea: Secondary | ICD-10-CM | POA: Diagnosis not present

## 2018-07-21 ENCOUNTER — Other Ambulatory Visit: Payer: Self-pay | Admitting: Cardiology

## 2018-09-05 DIAGNOSIS — H6120 Impacted cerumen, unspecified ear: Secondary | ICD-10-CM | POA: Diagnosis not present

## 2018-09-05 DIAGNOSIS — Z6823 Body mass index (BMI) 23.0-23.9, adult: Secondary | ICD-10-CM | POA: Diagnosis not present

## 2018-09-05 DIAGNOSIS — Z299 Encounter for prophylactic measures, unspecified: Secondary | ICD-10-CM | POA: Diagnosis not present

## 2018-09-05 DIAGNOSIS — E1165 Type 2 diabetes mellitus with hyperglycemia: Secondary | ICD-10-CM | POA: Diagnosis not present

## 2018-09-10 DIAGNOSIS — Z23 Encounter for immunization: Secondary | ICD-10-CM | POA: Diagnosis not present

## 2018-09-10 DIAGNOSIS — I10 Essential (primary) hypertension: Secondary | ICD-10-CM | POA: Diagnosis not present

## 2018-09-10 DIAGNOSIS — H6120 Impacted cerumen, unspecified ear: Secondary | ICD-10-CM | POA: Diagnosis not present

## 2018-09-10 DIAGNOSIS — Z6823 Body mass index (BMI) 23.0-23.9, adult: Secondary | ICD-10-CM | POA: Diagnosis not present

## 2018-09-10 DIAGNOSIS — Z299 Encounter for prophylactic measures, unspecified: Secondary | ICD-10-CM | POA: Diagnosis not present

## 2018-09-19 NOTE — Progress Notes (Signed)
Cardiology Office Note  Date: 09/22/2018   ID: [name], DOB 1936/01/29, MRN [id number]  PCP: Glenda Chroman, MD  Primary Cardiologist: Rozann Lesches, MD   Chief Complaint  Patient presents with  . Hypertension    History of Present Illness: [name] is a 82 y.o. female last seen in March.  She is here for a follow-up visit.  She states that she feels "bad" after taking her morning medications, sometimes in the evening.  Blood pressure has apparently been lower during these times.  At the last visit Norvasc and labetalol doses were down titrated.  We discussed stopping the morning dose of Norvasc as well.  I personally reviewed her ECG today which shows normal sinus rhythm.  Carotid and renal artery Dopplers from earlier this year are outlined below.   Past Medical History:  Diagnosis Date  . Anemia   . Anxiety disorder   . Arthritis   . Chronic constipation   . Claudication [facility])    Chronic occlusion of the right limb of aortic stent graft.  . Diverticulosis    Left sided noted on colonoscopy 02/2006  . Ectopic atrial tachycardia ([place])   . Esophageal reflux disease   . Essential hypertension   . Hiatal hernia   . Hyperlipidemia   . Lumbar radiculopathy   . Macular degeneration   . Migraines   . Non-obstructive CAD    a. Reportedly nonobstructive at catheterization in the 1990s;  b. 03/2014 Cath: LM nl, LAD 30-40p, 49m, D1 20ost, D2 50ost/p, D3 small, LCX nl/nondominant, OM 1/2/3 nl, RCA 20-30p, 11m, PDA nl, RPL1/2  small, nl.  . Penetrating atherosclerotic ulcer of aorta (HCC)    Complex ulcerated plaque of the infrarenal abdominal aorta mild renal artery stenosis on the right side, normal left renal artery catheterization 2009, status post aortic stent graft  . Renal artery stenosis ([place])    a. Right renal artery stent April 2013 - Dr. Fletcher Anon;  b. 03/2014 PTA/stenting of RRA 2/2 ISR: 6x18 Herculink stent.  . Trigeminal neuralgia    Right  . Type 2  diabetes mellitus ([place])     Past Surgical History:  Procedure Laterality Date  . ABDOMINAL AORTAGRAM N/A 02/13/2012   Procedure: ABDOMINAL [name];  Surgeon: Wellington Hampshire, MD;  Location: [place] CATH LAB;  Service: Cardiovascular;  Laterality: N/A;  . ABDOMINAL AORTAGRAM N/A 03/17/2014   Procedure: ABDOMINAL [name];  Surgeon: Wellington Hampshire, MD;  Location: [place] CATH LAB;  Service: Cardiovascular;  Laterality: N/A;  . ABDOMINAL HYSTERECTOMY  1982  . BREAST BIOPSY Left 1990's  . CAROTID ENDARTERECTOMY Left 1993  . Endologix powerlink bifurcated     Covered stent graft  . LEFT HEART CATHETERIZATION WITH CORONARY ANGIOGRAM N/A 03/17/2014   Procedure: LEFT HEART CATHETERIZATION WITH CORONARY ANGIOGRAM;  Surgeon: Wellington Hampshire, MD;  Location: [place] CATH LAB;  Service: Cardiovascular;  Laterality: N/A;  . LUMBAR [place]  . PERCUTANEOUS STENT INTERVENTION Right 03/17/2014   Procedure: PERCUTANEOUS STENT INTERVENTION;  Surgeon: Wellington Hampshire, MD;  Location: [place] CATH LAB;  Service: Cardiovascular;  Laterality: Right;  Renal  . RENAL ANGIOGRAM N/A 02/13/2012   Procedure: RENAL ANGIOGRAM;  Surgeon: Wellington Hampshire, MD;  Location: [place] CATH LAB;  Service: Cardiovascular;  Laterality: N/A;  . RENAL ANGIOGRAM N/A 03/17/2014   Procedure: RENAL ANGIOGRAM;  Surgeon: Wellington Hampshire, MD;  Location: [place] CATH LAB;  Service: Cardiovascular;  Laterality: N/A;  . TONSILLECTOMY AND ADENOIDECTOMY  1947    Current Outpatient Medications  Medication Sig Dispense Refill  . acetaminophen (TYLENOL) 325 MG tablet Take 325 mg by mouth every 6 (six) hours as needed. For pain.    Marland Kitchen amLODipine (NORVASC) 2.5 MG tablet Take 1 tablet (2.5 mg total) by mouth every evening.    Marland Kitchen aspirin EC 81 MG tablet Take 81 mg by mouth at bedtime.     . Cholecalciferol (VITAMIN D3) 1000 UNITS CAPS Take 1,000 capsules by mouth [name].    . clonazePAM (KLONOPIN) 0.5 MG tablet Take 0.125 mg by mouth at bedtime as needed for anxiety.       . cloNIDine (CATAPRES) 0.1 MG tablet Take 0.1 mg by mouth 2 (two) times [name] as needed (SBP>170). Reported on 02/15/2016    . labetalol (NORMODYNE) 200 MG tablet Take 0.5 tablets (100 mg total) by mouth 3 (three) times [name].    . Multiple Vitamins-Minerals (PRESERVISION/LUTEIN) CAPS Take 1 capsule by mouth 2 (two) times [name].     . polyethylene glycol (MIRALAX / GLYCOLAX) packet Take 17 g by mouth [name] as needed for mild constipation. Constipation    . rosuvastatin (CRESTOR) 5 MG tablet TAKE 1 TABLET [name] 90 tablet 4   No current facility-administered medications for this visit.    Allergies:  Cefixime; Conjugated estrogens; Morphine and related; Codeine; Iohexol; and Adenosine   Social History: The patient  reports that she quit smoking about 38 years ago. Her smoking use included cigarettes. She has a 0.20 pack-year smoking history. She has never used smokeless tobacco. She reports that she does not drink alcohol or use drugs.   ROS:  Please see the history of present illness. Otherwise, complete review of systems is positive for chronic back pain.  All other systems are reviewed and negative.   Physical Exam: VS:  BP 110/72   Pulse 72   Ht 5' 1.5" (1.562 m)   Wt 123 lb (55.8 kg)   SpO2 98%   BMI 22.86 kg/m , BMI Body mass index is 22.86 kg/m.  Wt Readings from Last 3 Encounters:  09/22/18 123 lb (55.8 kg)  02/03/18 124 lb (56.2 kg)  08/05/17 122 lb (55.3 kg)    General: Patient appears comfortable at rest. HEENT: Conjunctiva and lids normal, oropharynx clear. Neck: Supple, no elevated JVP or carotid bruits, no thyromegaly. Lungs: Clear to auscultation, nonlabored breathing at rest. Cardiac: Regular rate and rhythm, no S3 or significant systolic murmur. Abdomen: Soft, nontender, bowel sounds present, no guarding or rebound. Extremities: No pitting edema, distal pulses 2+.  ECG: I personally reviewed the tracing from 08/05/2017 which showed sinus rhythm with poor R wave  progression and left atrial enlargement.  Recent Labwork:  June 2018: Hgb 12.5, platelets 188, TSH 2.91, cholesterol 164, TG 78, HDL 56, LDL 92, BUN 19, creatinine 0.85, potassium 4.5, AST 18, ALT 14  Other Studies Reviewed Today:  Echocardiogram 03/09/2015: Study Conclusions  - Left ventricle: The cavity size was normal. Systolic function was normal. The estimated ejection fraction was in the range of 55% to 60%. Wall motion was normal; there were no regional wall motion abnormalities. Doppler parameters are consistent with abnormal left ventricular relaxation (grade 1 diastolic dysfunction). Doppler parameters are consistent with high ventricular filling pressure. Mild focal basal septal hypertrophy. - Aortic valve: Mildly calcified annulus. Trileaflet; mildly thickened leaflets. - Mitral valve: Mild to moderately thickened leaflets . There was trivial regurgitation. - Left atrium: The atrium was mildly dilated. - Tricuspid valve: There was mild-moderate  regurgitation. - Pulmonic valve: There was mild regurgitation.  Carotid Dopplers 02/27/2018: Final Interpretation: Right Carotid: Velocities in the right ICA are consistent with a 1-39% stenosis.  Left Carotid: Velocities in the left ICA are consistent with a 1-39% stenosis.  Vertebrals: Bilateral vertebral arteries demonstrate antegrade flow. Subclavians: Normal flow hemodynamics were seen in bilateral subclavian       arteries.  Renal arterial Dopplers 02/27/2018: FINAL INTERPRETATION: Largest Aortic Diameter: 2.0 cm  Renal:  Right: Normal size right kidney. Normal right Resisitive Index.    Normal cortical thickness of right kidney. 1-59% stenosis of    the right renal artery. Cyst(s) noted. Left: Normal size of left kidney. Normal left Resistive Index. No    evidence of left renal artery stenosis. Mesenteric: Normal Celiac artery and Superior Mesenteric artery  findings. IVC and renal veins are patent.  Assessment and Plan:  1.  Essential hypertension.  She is taking labetalol 100 mg 3 times a day and we will otherwise have her reduce Norvasc 2.5 mg once in the evening.  2.  History of renal artery stenosis status post stent intervention to the right renal artery in 2013 as well as 2015.  Dopplers from earlier this year showed nonobstructive flow on the right.  3.  Nonobstructive CAD, no obvious angina symptoms and ECG normal today.  Continue aspirin and statin.  Current medicines were reviewed with the patient today.   Orders Placed This Encounter  Procedures  . EKG 12-Lead    Disposition: Follow-up in 6 months.  Signed, [name], MD, [facility] 09/22/2018 11:58 AM    [facility] [place], [place], [place] [address] Phone: [phone]; Fax: [phone]

## 2018-09-22 ENCOUNTER — Ambulatory Visit (INDEPENDENT_AMBULATORY_CARE_PROVIDER_SITE_OTHER): Payer: Medicare Other | Admitting: Cardiology

## 2018-09-22 ENCOUNTER — Encounter: Payer: Self-pay | Admitting: Cardiology

## 2018-09-22 VITALS — BP 110/72 | HR 72 | Ht 61.5 in | Wt 123.0 lb

## 2018-09-22 DIAGNOSIS — I2510 Atherosclerotic heart disease of native coronary artery without angina pectoris: Secondary | ICD-10-CM

## 2018-09-22 DIAGNOSIS — Z8679 Personal history of other diseases of the circulatory system: Secondary | ICD-10-CM

## 2018-09-22 DIAGNOSIS — I701 Atherosclerosis of renal artery: Secondary | ICD-10-CM

## 2018-09-22 DIAGNOSIS — I10 Essential (primary) hypertension: Secondary | ICD-10-CM

## 2018-09-22 MED ORDER — AMLODIPINE BESYLATE 2.5 MG PO TABS: 2.5000 mg | ORAL_TABLET | Freq: Every evening | ORAL | Status: DC

## 2018-09-22 MED ORDER — LABETALOL HCL 200 MG PO TABS: 100.0000 mg | ORAL_TABLET | Freq: Three times a day (TID) | ORAL | Status: DC

## 2018-09-22 NOTE — Patient Instructions (Signed)
Medication Instructions:   Continue the Labetalol at 100mg  three times per day.  Decrease the Amlodipine to the evening dose only.   Continue all other medications.    Labwork: none  Testing/Procedures: none  Follow-Up: Your physician wants you to follow up in: 6 months.  You will receive a reminder letter in the mail one-two months in advance.  If you don't receive a letter, please call our office to schedule the follow up appointment   Any Other Special Instructions Will Be Listed Below (If Applicable).  If you need a refill on your cardiac medications before your next appointment, please call your pharmacy.

## 2018-10-15 DIAGNOSIS — Z6823 Body mass index (BMI) 23.0-23.9, adult: Secondary | ICD-10-CM | POA: Diagnosis not present

## 2018-10-15 DIAGNOSIS — I10 Essential (primary) hypertension: Secondary | ICD-10-CM | POA: Diagnosis not present

## 2018-10-15 DIAGNOSIS — J329 Chronic sinusitis, unspecified: Secondary | ICD-10-CM | POA: Diagnosis not present

## 2018-10-15 DIAGNOSIS — Z299 Encounter for prophylactic measures, unspecified: Secondary | ICD-10-CM | POA: Diagnosis not present

## 2018-10-23 DIAGNOSIS — Z6823 Body mass index (BMI) 23.0-23.9, adult: Secondary | ICD-10-CM | POA: Diagnosis not present

## 2018-10-23 DIAGNOSIS — Z299 Encounter for prophylactic measures, unspecified: Secondary | ICD-10-CM | POA: Diagnosis not present

## 2018-10-23 DIAGNOSIS — H6980 Other specified disorders of Eustachian tube, unspecified ear: Secondary | ICD-10-CM | POA: Diagnosis not present

## 2018-10-23 DIAGNOSIS — I10 Essential (primary) hypertension: Secondary | ICD-10-CM | POA: Diagnosis not present

## 2018-10-24 DIAGNOSIS — E7800 Pure hypercholesterolemia, unspecified: Secondary | ICD-10-CM | POA: Diagnosis not present

## 2018-10-24 DIAGNOSIS — E1165 Type 2 diabetes mellitus with hyperglycemia: Secondary | ICD-10-CM | POA: Diagnosis not present

## 2018-10-24 DIAGNOSIS — Z299 Encounter for prophylactic measures, unspecified: Secondary | ICD-10-CM | POA: Diagnosis not present

## 2018-10-24 DIAGNOSIS — I10 Essential (primary) hypertension: Secondary | ICD-10-CM | POA: Diagnosis not present

## 2018-10-24 DIAGNOSIS — H6980 Other specified disorders of Eustachian tube, unspecified ear: Secondary | ICD-10-CM | POA: Diagnosis not present

## 2018-10-24 DIAGNOSIS — Z6822 Body mass index (BMI) 22.0-22.9, adult: Secondary | ICD-10-CM | POA: Diagnosis not present

## 2018-11-26 DIAGNOSIS — H353110 Nonexudative age-related macular degeneration, right eye, stage unspecified: Secondary | ICD-10-CM | POA: Diagnosis not present

## 2018-12-01 ENCOUNTER — Ambulatory Visit (INDEPENDENT_AMBULATORY_CARE_PROVIDER_SITE_OTHER): Payer: Medicare Other | Admitting: Otolaryngology

## 2018-12-01 DIAGNOSIS — H6983 Other specified disorders of Eustachian tube, bilateral: Secondary | ICD-10-CM | POA: Diagnosis not present

## 2018-12-01 DIAGNOSIS — H903 Sensorineural hearing loss, bilateral: Secondary | ICD-10-CM | POA: Diagnosis not present

## 2019-03-21 ENCOUNTER — Other Ambulatory Visit: Payer: Self-pay | Admitting: Cardiology

## 2019-04-01 ENCOUNTER — Telehealth: Payer: Self-pay | Admitting: Cardiology

## 2019-04-01 NOTE — Progress Notes (Signed)
Virtual Visit via Telephone Note   This visit type was conducted due to national recommendations for restrictions regarding the COVID-19 Pandemic (e.g. social distancing) in an effort to limit this patient's exposure and mitigate transmission in our community.  Due to her co-morbid illnesses, this patient is at least at moderate risk for complications without adequate follow up.  This format is felt to be most appropriate for this patient at this time.  The patient did not have access to video technology/had technical difficulties with video requiring transitioning to audio format only (telephone).  All issues noted in this document were discussed and addressed.  No physical exam could be performed with this format.  Please refer to the patient's chart for her  consent to telehealth for [facility].   Date:  04/03/2019   ID:  [name], DOB 1936-04-25, MRN [id number]  Patient Location: Home Provider Location: Office  PCP:  Glenda Chroman, MD  Cardiologist:  Rozann Lesches, MD  Evaluation Performed:  Follow-Up Visit  Chief Complaint:   Cardiac follow-up  History of Present Illness:    [name] is an 83 y.o. female last seen in November 2019.  She did not have video access and we spoke by phone today.  She states that she has been doing reasonably well, has noticed in a sense of palpitations and irregular heartbeat, no dizziness or syncope.  At the last visit she continued on labetalol and we reduced Norvasc to 2.5 mg once in the evening.  Remaining medications are outlined below.  Her blood pressures have been reasonably well controlled.  She has been staying at home, her daughter gets all of her groceries for her.  The patient does not have symptoms concerning for COVID-19 infection (fever, chills, cough, or new shortness of breath).    Past Medical History:  Diagnosis Date  . Anemia   . Anxiety disorder   . Arthritis   . Chronic constipation   . Claudication [facility])     Chronic occlusion of the right limb of aortic stent graft.  . Diverticulosis    Left sided noted on colonoscopy 02/2006  . Ectopic atrial tachycardia ([place])   . Esophageal reflux disease   . Essential hypertension   . Hiatal hernia   . Hyperlipidemia   . Lumbar radiculopathy   . Macular degeneration   . Migraines   . Non-obstructive CAD    a. Reportedly nonobstructive at catheterization in the 1990s;  b. 03/2014 Cath: LM nl, LAD 30-40p, 76m, D1 20ost, D2 50ost/p, D3 small, LCX nl/nondominant, OM 1/2/3 nl, RCA 20-30p, 61m, PDA nl, RPL1/2  small, nl.  . Penetrating atherosclerotic ulcer of aorta (HCC)    Complex ulcerated plaque of the infrarenal abdominal aorta mild renal artery stenosis on the right side, normal left renal artery catheterization 2009, status post aortic stent graft  . Renal artery stenosis ([place])    a. Right renal artery stent April 2013 - Dr. Fletcher Anon;  b. 03/2014 PTA/stenting of RRA 2/2 ISR: 6x18 Herculink stent.  . Trigeminal neuralgia    Right  . Type 2 diabetes mellitus ([place])    Past Surgical History:  Procedure Laterality Date  . ABDOMINAL AORTAGRAM N/A 02/13/2012   Procedure: ABDOMINAL Maxcine Ham;  Surgeon: Wellington Hampshire, MD;  Location: [place] CATH LAB;  Service: Cardiovascular;  Laterality: N/A;  . ABDOMINAL AORTAGRAM N/A 03/17/2014   Procedure: ABDOMINAL Maxcine Ham;  Surgeon: Wellington Hampshire, MD;  Location: [place] CATH LAB;  Service: Cardiovascular;  Laterality:  N/A;  . ABDOMINAL HYSTERECTOMY  1982  . BREAST BIOPSY Left 1990's  . CAROTID ENDARTERECTOMY Left 1993  . Endologix powerlink bifurcated     Covered stent graft  . LEFT HEART CATHETERIZATION WITH CORONARY ANGIOGRAM N/A 03/17/2014   Procedure: LEFT HEART CATHETERIZATION WITH CORONARY ANGIOGRAM;  Surgeon: Wellington Hampshire, MD;  Location: [place] CATH LAB;  Service: Cardiovascular;  Laterality: N/A;  . LUMBAR [place]  . PERCUTANEOUS STENT INTERVENTION Right 03/17/2014   Procedure: PERCUTANEOUS STENT INTERVENTION;   Surgeon: Wellington Hampshire, MD;  Location: [place] CATH LAB;  Service: Cardiovascular;  Laterality: Right;  Renal  . RENAL ANGIOGRAM N/A 02/13/2012   Procedure: RENAL ANGIOGRAM;  Surgeon: Wellington Hampshire, MD;  Location: [place] CATH LAB;  Service: Cardiovascular;  Laterality: N/A;  . RENAL ANGIOGRAM N/A 03/17/2014   Procedure: RENAL ANGIOGRAM;  Surgeon: Wellington Hampshire, MD;  Location: [place] CATH LAB;  Service: Cardiovascular;  Laterality: N/A;  . TONSILLECTOMY AND ADENOIDECTOMY  1947     Current Meds  Medication Sig  . acetaminophen (TYLENOL) 325 MG tablet Take 325 mg by mouth every 6 (six) hours as needed. For pain.  Marland Kitchen amLODipine (NORVASC) 2.5 MG tablet TAKE 1 TABLET TWICE A DAY  . aspirin EC 81 MG tablet Take 81 mg by mouth at bedtime.   . Cholecalciferol (VITAMIN D3) 1000 UNITS CAPS Take 1,000 capsules by mouth daily.  . clonazePAM (KLONOPIN) 0.5 MG tablet Take 0.125 mg by mouth at bedtime as needed for anxiety.   . cloNIDine (CATAPRES) 0.1 MG tablet Take 0.1 mg by mouth 2 (two) times daily as needed (SBP>170). Reported on 02/15/2016  . labetalol (NORMODYNE) 200 MG tablet Take 0.5 tablets (100 mg total) by mouth 3 (three) times daily.  . Multiple Vitamins-Minerals (PRESERVISION/LUTEIN) CAPS Take 1 capsule by mouth 2 (two) times daily.   . polyethylene glycol (MIRALAX / GLYCOLAX) packet Take 17 g by mouth daily as needed for mild constipation. Constipation  . rosuvastatin (CRESTOR) 5 MG tablet TAKE 1 TABLET DAILY     Allergies:   Cefixime; Conjugated estrogens; Morphine and related; Codeine; Iohexol; and Adenosine   Social History   Tobacco Use  . Smoking status: Former Smoker    Packs/day: 0.20    Years: 1.00    Pack years: 0.20    Types: Cigarettes    Last attempt to quit: 11/13/1979    Years since quitting: 39.4  . Smokeless tobacco: Never Used  Substance Use Topics  . Alcohol use: No    Alcohol/week: 0.0 standard drinks  . Drug use: No     Family Hx: The patient's family history includes  Coronary artery disease in her mother; Heart attack in her father; Heart disease in her brother, father, and mother; Heart failure in her mother; Hypertension in her brother, father, mother, sister, and sister; Kidney disease in her sister; Parkinson's disease in her sister; Stroke in her mother, sister, and sister. There is no history of Breast cancer.  ROS:   Please see the history of present illness.    All other systems reviewed and are negative.   Prior CV studies:   The following studies were reviewed today:  Echocardiogram 03/09/2015: Study Conclusions  - Left ventricle: The cavity size was normal. Systolic function was normal. The estimated ejection fraction was in the range of 55% to 60%. Wall motion was normal; there were no regional wall motion abnormalities. Doppler parameters are consistent with abnormal left ventricular relaxation (grade 1 diastolic dysfunction). Doppler  parameters are consistent with high ventricular filling pressure. Mild focal basal septal hypertrophy. - Aortic valve: Mildly calcified annulus. Trileaflet; mildly thickened leaflets. - Mitral valve: Mild to moderately thickened leaflets . There was trivial regurgitation. - Left atrium: The atrium was mildly dilated. - Tricuspid valve: There was mild-moderate regurgitation. - Pulmonic valve: There was mild regurgitation.  Carotid Dopplers 02/27/2018: Final Interpretation: Right Carotid: Velocities in the right ICA are consistent with a 1-39% stenosis.  Left Carotid: Velocities in the left ICA are consistent with a 1-39% stenosis.  Vertebrals: Bilateral vertebral arteries demonstrate antegrade flow. Subclavians: Normal flow hemodynamics were seen in bilateral subclavian       arteries.  Renal arterial Dopplers 02/27/2018: FINAL INTERPRETATION: Largest Aortic Diameter: 2.0 cm  Renal:  Right: Normal size right kidney. Normal right Resisitive Index.    Normal  cortical thickness of right kidney. 1-59% stenosis of    the right renal artery. Cyst(s) noted. Left: Normal size of left kidney. Normal left Resistive Index. No    evidence of left renal artery stenosis. Mesenteric: Normal Celiac artery and Superior Mesenteric artery findings. IVC and renal veins are patent.  Labs/Other Tests and Data Reviewed:    EKG:  An ECG dated 09/22/2018 was personally reviewed today and demonstrated:  Normal sinus rhythm.  Recent Labs:  June 2018: Hgb 12.5, platelets 188, TSH 2.91, cholesterol 164, TG 78, HDL 56, LDL 92, BUN 19, creatinine 0.85, potassium 4.5, AST 18, ALT 14  Wt Readings from Last 3 Encounters:  04/03/19 125 lb (56.7 kg)  09/22/18 123 lb (55.8 kg)  02/03/18 124 lb (56.2 kg)     Objective:    Vital Signs:  BP 135/60   Pulse 72   Ht 5' 1.5" (1.562 m)   Wt 125 lb (56.7 kg)   BMI 23.24 kg/m    Patient spoke in full sentences on the phone, no shortness of breath. No audible wheezing. Speech pattern normal.  ASSESSMENT & PLAN:    1.  Essential hypertension.  Blood pressure control looks good and we will continue with current medications.  2.  Palpitations.  No dizziness or syncope.  For now she was comfortable with observation, but I asked her to let us know if symptoms worsen and we can pursue a ZIO patch.  3.  Nonobstructive CAD by history.  No active angina symptoms.  She continues on aspirin and statin.  COVID-19 Education: The signs and symptoms of COVID-19 were discussed with the patient and how to seek care for testing (follow up with PCP or arrange E-visit).  The importance of social distancing was discussed today.  Time:   Today, I have spent 6 minutes with the patient with telehealth technology discussing the above problems.     Medication Adjustments/Labs and Tests Ordered: Current medicines are reviewed at length with the patient today.  Concerns regarding medicines are outlined above.   Tests Ordered: No  orders of the defined types were placed in this encounter.   Medication Changes: No orders of the defined types were placed in this encounter.   Disposition:  Follow up within 6 months, sooner if ZIO patch ordered in the interim.  Signed, Rozann Lesches, MD  04/03/2019 10:39 AM    [place]

## 2019-04-01 NOTE — Telephone Encounter (Signed)
Virtual Visit Pre-Appointment Phone Call  "(Name), I am calling you today to discuss your upcoming appointment. We are currently trying to limit exposure to the virus that causes COVID-19 by seeing patients at home rather than in the office."  1. "What is the BEST phone number to call the day of the visit?" - include this in appointment notes  2. Do you have or have access to (through a family member/friend) a smartphone with video capability that we can use for your visit?" a. If yes - list this number in appt notes as cell (if different from BEST phone #) and list the appointment type as a VIDEO visit in appointment notes b. If no - list the appointment type as a PHONE visit in appointment notes  3. Confirm consent - "In the setting of the current Covid19 crisis, you are scheduled for a (phone or video) visit with your provider on (date) at (time).  Just as we do with many in-office visits, in order for you to participate in this visit, we must obtain consent.  If you'd like, I can send this to your mychart (if signed up) or email for you to review.  Otherwise, I can obtain your verbal consent now.  All virtual visits are billed to your insurance company just like a normal visit would be.  By agreeing to a virtual visit, we'd like you to understand that the technology does not allow for your provider to perform an examination, and thus may limit your provider's ability to fully assess your condition. If your provider identifies any concerns that need to be evaluated in person, we will make arrangements to do so.  Finally, though the technology is pretty good, we cannot assure that it will always work on either your or our end, and in the setting of a video visit, we may have to convert it to a phone-only visit.  In either situation, we cannot ensure that we have a secure connection.  Are you willing to proceed?" STAFF: Did the patient verbally acknowledge consent to telehealth visit? Document  YES/NO here: yes  4. Advise patient to be prepared - "Two hours prior to your appointment, go ahead and check your blood pressure, pulse, oxygen saturation, and your weight (if you have the equipment to check those) and write them all down. When your visit starts, your provider will ask you for this information. If you have an Apple Watch or Kardia device, please plan to have heart rate information ready on the day of your appointment. Please have a pen and paper handy nearby the day of the visit as well."  5. Give patient instructions for MyChart download to smartphone OR Doximity/Doxy.me as below if video visit (depending on what platform provider is using)  6. Inform patient they will receive a phone call 15 minutes prior to their appointment time (may be from unknown caller ID) so they should be prepared to answer    TELEPHONE CALL NOTE  [name] has been deemed a candidate for a follow-up tele-health visit to limit community exposure during the Covid-19 pandemic. I spoke with the patient via phone to ensure availability of phone/video source, confirm preferred email & phone number, and discuss instructions and expectations.  I reminded [name] to be prepared with any vital sign and/or heart rhythm information that could potentially be obtained via home monitoring, at the time of her visit. I reminded [name] to expect a phone call prior to  her visit.  Weston Anna 04/01/2019 11:19 AM   INSTRUCTIONS FOR DOWNLOADING THE MYCHART APP TO SMARTPHONE  - The patient must first make sure to have activated MyChart and know their login information - If Apple, go to [employer] and type in MyChart in the search bar and download the app. If Android, ask patient to go to [employer] and type in [place] in the search bar and download the app. The app is free but as with any other app downloads, their phone may require them to verify saved payment information or Apple/Android  password.  - The patient will need to then log into the app with their MyChart username and password, and select [facility] as their healthcare provider to link the account. When it is time for your visit, go to the MyChart app, find appointments, and click Begin Video Visit. Be sure to Select Allow for your device to access the Microphone and Camera for your visit. You will then be connected, and your provider will be with you shortly.  **If they have any issues connecting, or need assistance please contact MyChart service desk [phone])**  **If using a computer, in order to ensure the best quality for their visit they will need to use either of the following Internet Browsers: [employer], or Google Chrome**  IF USING DOXIMITY or DOXY.ME - The patient will receive a link just prior to their visit by text.     FULL LENGTH CONSENT FOR TELE-HEALTH VISIT   I hereby voluntarily request, consent and authorize [facility] and its employed or contracted physicians, physician assistants, nurse practitioners or other licensed health care professionals (the Practitioner), to provide me with telemedicine health care services (the Services") as deemed necessary by the treating Practitioner. I acknowledge and consent to receive the Services by the Practitioner via telemedicine. I understand that the telemedicine visit will involve communicating with the Practitioner through live audiovisual communication technology and the disclosure of certain medical information by electronic transmission. I acknowledge that I have been given the opportunity to request an in-person assessment or other available alternative prior to the telemedicine visit and am voluntarily participating in the telemedicine visit.  I understand that I have the right to withhold or withdraw my consent to the use of telemedicine in the course of my care at any time, without affecting my right to future care or treatment,  and that the Practitioner or I may terminate the telemedicine visit at any time. I understand that I have the right to inspect all information obtained and/or recorded in the course of the telemedicine visit and may receive copies of available information for a reasonable fee.  I understand that some of the potential risks of receiving the Services via telemedicine include:   Delay or interruption in medical evaluation due to technological equipment failure or disruption;  Information transmitted may not be sufficient (e.g. poor resolution of images) to allow for appropriate medical decision making by the Practitioner; and/or   In rare instances, security protocols could fail, causing a breach of personal health information.  Furthermore, I acknowledge that it is my responsibility to provide information about my medical history, conditions and care that is complete and accurate to the best of my ability. I acknowledge that Practitioner's advice, recommendations, and/or decision may be based on factors not within their control, such as incomplete or inaccurate data provided by me or distortions of diagnostic images or specimens that may result from electronic transmissions. I  understand that the practice of medicine is not an exact science and that Practitioner makes no warranties or guarantees regarding treatment outcomes. I acknowledge that I will receive a copy of this consent concurrently upon execution via email to the email address I last provided but may also request a printed copy by calling the office of [facility].    I understand that my insurance will be billed for this visit.   I have read or had this consent read to me.  I understand the contents of this consent, which adequately explains the benefits and risks of the Services being provided via telemedicine.   I have been provided ample opportunity to ask questions regarding this consent and the Services and have had my questions  answered to my satisfaction.  I give my informed consent for the services to be provided through the use of telemedicine in my medical care  By participating in this telemedicine visit I agree to the above.

## 2019-04-03 ENCOUNTER — Encounter: Payer: Self-pay | Admitting: Cardiology

## 2019-04-03 ENCOUNTER — Telehealth (INDEPENDENT_AMBULATORY_CARE_PROVIDER_SITE_OTHER): Payer: Medicare Other | Admitting: Cardiology

## 2019-04-03 VITALS — BP 135/60 | HR 72 | Ht 61.5 in | Wt 125.0 lb

## 2019-04-03 DIAGNOSIS — Z7189 Other specified counseling: Secondary | ICD-10-CM | POA: Diagnosis not present

## 2019-04-03 DIAGNOSIS — I10 Essential (primary) hypertension: Secondary | ICD-10-CM | POA: Diagnosis not present

## 2019-04-03 DIAGNOSIS — I2510 Atherosclerotic heart disease of native coronary artery without angina pectoris: Secondary | ICD-10-CM

## 2019-04-03 DIAGNOSIS — R002 Palpitations: Secondary | ICD-10-CM

## 2019-04-03 DIAGNOSIS — I6523 Occlusion and stenosis of bilateral carotid arteries: Secondary | ICD-10-CM

## 2019-04-03 NOTE — Patient Instructions (Addendum)
Medication Instructions:  Continue all current medications.  Labwork: none  Testing/Procedures: none  Follow-Up:  If palpitations get worse, notify the office & we will order a 48 hour monitor.    Then, schedule follow up office visit after that.    Any Other Special Instructions Will Be Listed Below (If Applicable). You do not have office visit scheduled in July.  You do have renal ultrasound scheduled for May 13, 2019.    If you need a refill on your cardiac medications before your next appointment, please call your pharmacy.

## 2019-04-08 DIAGNOSIS — I10 Essential (primary) hypertension: Secondary | ICD-10-CM | POA: Diagnosis not present

## 2019-04-08 DIAGNOSIS — F419 Anxiety disorder, unspecified: Secondary | ICD-10-CM | POA: Diagnosis not present

## 2019-04-08 DIAGNOSIS — Z713 Dietary counseling and surveillance: Secondary | ICD-10-CM | POA: Diagnosis not present

## 2019-04-08 DIAGNOSIS — Z299 Encounter for prophylactic measures, unspecified: Secondary | ICD-10-CM | POA: Diagnosis not present

## 2019-04-08 DIAGNOSIS — M1990 Unspecified osteoarthritis, unspecified site: Secondary | ICD-10-CM | POA: Diagnosis not present

## 2019-05-19 DIAGNOSIS — Z1211 Encounter for screening for malignant neoplasm of colon: Secondary | ICD-10-CM | POA: Diagnosis not present

## 2019-05-19 DIAGNOSIS — E7800 Pure hypercholesterolemia, unspecified: Secondary | ICD-10-CM | POA: Diagnosis not present

## 2019-05-19 DIAGNOSIS — R5383 Other fatigue: Secondary | ICD-10-CM | POA: Diagnosis not present

## 2019-05-19 DIAGNOSIS — Z1331 Encounter for screening for depression: Secondary | ICD-10-CM | POA: Diagnosis not present

## 2019-05-19 DIAGNOSIS — I10 Essential (primary) hypertension: Secondary | ICD-10-CM | POA: Diagnosis not present

## 2019-05-19 DIAGNOSIS — Z6823 Body mass index (BMI) 23.0-23.9, adult: Secondary | ICD-10-CM | POA: Diagnosis not present

## 2019-05-19 DIAGNOSIS — Z299 Encounter for prophylactic measures, unspecified: Secondary | ICD-10-CM | POA: Diagnosis not present

## 2019-05-19 DIAGNOSIS — Z0000 Encounter for general adult medical examination without abnormal findings: Secondary | ICD-10-CM | POA: Diagnosis not present

## 2019-05-19 DIAGNOSIS — Z7189 Other specified counseling: Secondary | ICD-10-CM | POA: Diagnosis not present

## 2019-05-19 DIAGNOSIS — Z1339 Encounter for screening examination for other mental health and behavioral disorders: Secondary | ICD-10-CM | POA: Diagnosis not present

## 2019-05-19 DIAGNOSIS — K5909 Other constipation: Secondary | ICD-10-CM | POA: Diagnosis not present

## 2019-05-20 DIAGNOSIS — Z79899 Other long term (current) drug therapy: Secondary | ICD-10-CM | POA: Diagnosis not present

## 2019-05-20 DIAGNOSIS — E7800 Pure hypercholesterolemia, unspecified: Secondary | ICD-10-CM | POA: Diagnosis not present

## 2019-05-20 DIAGNOSIS — R5383 Other fatigue: Secondary | ICD-10-CM | POA: Diagnosis not present

## 2019-05-21 DIAGNOSIS — E119 Type 2 diabetes mellitus without complications: Secondary | ICD-10-CM | POA: Diagnosis not present

## 2019-05-21 DIAGNOSIS — H43393 Other vitreous opacities, bilateral: Secondary | ICD-10-CM | POA: Diagnosis not present

## 2019-05-21 DIAGNOSIS — H353132 Nonexudative age-related macular degeneration, bilateral, intermediate dry stage: Secondary | ICD-10-CM | POA: Diagnosis not present

## 2019-05-21 DIAGNOSIS — H3554 Dystrophies primarily involving the retinal pigment epithelium: Secondary | ICD-10-CM | POA: Diagnosis not present

## 2019-06-02 ENCOUNTER — Telehealth: Payer: Self-pay | Admitting: Cardiology

## 2019-06-02 NOTE — Telephone Encounter (Signed)

## 2019-06-03 ENCOUNTER — Ambulatory Visit (INDEPENDENT_AMBULATORY_CARE_PROVIDER_SITE_OTHER): Payer: Medicare Other

## 2019-06-03 ENCOUNTER — Other Ambulatory Visit: Payer: Self-pay | Admitting: Cardiology

## 2019-06-03 ENCOUNTER — Other Ambulatory Visit: Payer: Self-pay

## 2019-06-03 DIAGNOSIS — I701 Atherosclerosis of renal artery: Secondary | ICD-10-CM | POA: Diagnosis not present

## 2019-06-04 ENCOUNTER — Telehealth: Payer: Self-pay | Admitting: *Deleted

## 2019-06-04 NOTE — Telephone Encounter (Signed)
-----   Message from [name], MD sent at 06/04/2019 11:33 AM EDT ----- Results reviewed.  No significant degree of right or left renal artery stenosis noted.  Right renal artery stent patent.

## 2019-06-05 NOTE — Telephone Encounter (Signed)
Returning call.

## 2019-06-08 NOTE — Telephone Encounter (Signed)
Patient informed. Copy sent to PCP °

## 2019-06-10 DIAGNOSIS — I10 Essential (primary) hypertension: Secondary | ICD-10-CM | POA: Diagnosis not present

## 2019-07-09 DIAGNOSIS — E7800 Pure hypercholesterolemia, unspecified: Secondary | ICD-10-CM | POA: Diagnosis not present

## 2019-07-09 DIAGNOSIS — E119 Type 2 diabetes mellitus without complications: Secondary | ICD-10-CM | POA: Diagnosis not present

## 2019-07-09 DIAGNOSIS — I10 Essential (primary) hypertension: Secondary | ICD-10-CM | POA: Diagnosis not present

## 2019-07-09 DIAGNOSIS — K5900 Constipation, unspecified: Secondary | ICD-10-CM | POA: Diagnosis not present

## 2019-07-13 DIAGNOSIS — I10 Essential (primary) hypertension: Secondary | ICD-10-CM | POA: Diagnosis not present

## 2019-07-28 DIAGNOSIS — E1165 Type 2 diabetes mellitus with hyperglycemia: Secondary | ICD-10-CM | POA: Diagnosis not present

## 2019-07-28 DIAGNOSIS — I471 Supraventricular tachycardia: Secondary | ICD-10-CM | POA: Diagnosis not present

## 2019-07-28 DIAGNOSIS — K589 Irritable bowel syndrome without diarrhea: Secondary | ICD-10-CM | POA: Diagnosis not present

## 2019-07-28 DIAGNOSIS — Z6822 Body mass index (BMI) 22.0-22.9, adult: Secondary | ICD-10-CM | POA: Diagnosis not present

## 2019-07-28 DIAGNOSIS — Z299 Encounter for prophylactic measures, unspecified: Secondary | ICD-10-CM | POA: Diagnosis not present

## 2019-07-28 DIAGNOSIS — I10 Essential (primary) hypertension: Secondary | ICD-10-CM | POA: Diagnosis not present

## 2019-08-12 ENCOUNTER — Other Ambulatory Visit: Payer: Self-pay | Admitting: Cardiology

## 2019-08-12 DIAGNOSIS — I10 Essential (primary) hypertension: Secondary | ICD-10-CM | POA: Diagnosis not present

## 2019-08-25 ENCOUNTER — Telehealth: Payer: Self-pay | Admitting: Cardiology

## 2019-08-25 NOTE — Telephone Encounter (Signed)
Reports BP being elevated today. Patient was fixing her lunch before nurse call. BP @11 :35 am was 184/93 & HR 77. Reports head feeling heavy and dizzy when she walks and says her heart starts racing. Reports headache rated 8/10. Reports having a headache almost daily after taking labetalol. Has not taken anything for headache. Reports fatigue. Denies chest pain, sob, n/v, cough, congestion or sore throat, numbness, tingling or blurred vision. Advised to take tylenol for headache, take the other half of the clonidine 0.1 mg, continue to monitor BP and symptoms, and if her symptoms got worse, to go to the ED for an evaluation. Appointment given to patient to be seen by Bernerd Pho 08/27/2019 @2 :30 pm [place] office.

## 2019-08-25 NOTE — Telephone Encounter (Signed)
Patient called stating that her BP is elevated.  BP 224/122 at 10AM  States that she feels dizzy. Took cloNIDINE 1/2 pill a few minutes ago.

## 2019-08-27 ENCOUNTER — Ambulatory Visit: Payer: Medicare Other | Admitting: Student

## 2019-09-01 DIAGNOSIS — I471 Supraventricular tachycardia: Secondary | ICD-10-CM | POA: Diagnosis not present

## 2019-09-01 DIAGNOSIS — Z299 Encounter for prophylactic measures, unspecified: Secondary | ICD-10-CM | POA: Diagnosis not present

## 2019-09-01 DIAGNOSIS — I10 Essential (primary) hypertension: Secondary | ICD-10-CM | POA: Diagnosis not present

## 2019-09-01 DIAGNOSIS — Z6822 Body mass index (BMI) 22.0-22.9, adult: Secondary | ICD-10-CM | POA: Diagnosis not present

## 2019-09-01 DIAGNOSIS — E785 Hyperlipidemia, unspecified: Secondary | ICD-10-CM | POA: Diagnosis not present

## 2019-09-01 DIAGNOSIS — E1165 Type 2 diabetes mellitus with hyperglycemia: Secondary | ICD-10-CM | POA: Diagnosis not present

## 2019-09-04 ENCOUNTER — Telehealth: Payer: Self-pay | Admitting: Cardiology

## 2019-09-04 NOTE — Telephone Encounter (Signed)

## 2019-09-07 ENCOUNTER — Ambulatory Visit (INDEPENDENT_AMBULATORY_CARE_PROVIDER_SITE_OTHER): Payer: Medicare Other | Admitting: Cardiology

## 2019-09-07 ENCOUNTER — Encounter: Payer: Self-pay | Admitting: Cardiology

## 2019-09-07 ENCOUNTER — Other Ambulatory Visit: Payer: Self-pay

## 2019-09-07 VITALS — BP 150/88 | HR 87 | Ht 61.0 in | Wt 119.0 lb

## 2019-09-07 DIAGNOSIS — Z8679 Personal history of other diseases of the circulatory system: Secondary | ICD-10-CM

## 2019-09-07 DIAGNOSIS — I10 Essential (primary) hypertension: Secondary | ICD-10-CM | POA: Diagnosis not present

## 2019-09-07 DIAGNOSIS — I701 Atherosclerosis of renal artery: Secondary | ICD-10-CM | POA: Diagnosis not present

## 2019-09-07 NOTE — Progress Notes (Signed)
Cardiology Office Note  Date: 09/07/2019   ID: [name], [name] 11-04-1936, MRN [id number]  PCP:  Glenda Chroman, MD  Cardiologist:  Rozann Lesches, MD Electrophysiologist:  None   Chief Complaint  Patient presents with  . Cardiac follow-up    History of Present Illness: [name] is an 83 y.o. female last assessed via telehealth encounter in May.  She presents for a routine visit.  We went over her home blood pressure checks.  Overall she had good control until recent spikes without obvious precipitant.  She does feel like her heart skips during these times.  She has had medication intolerances, most recently is taking low-dose labetalol and low-dose amlodipine.  Clonidine is used as needed for systolic blood pressure spikes over 180s.  I did talk with her about considering change to a different beta-blocker, she is very hesitant to make any adjustments right now.  She had follow-up renal artery Dopplers done in July as noted below.  Past Medical History:  Diagnosis Date  . Anemia   . Anxiety disorder   . Arthritis   . Chronic constipation   . Claudication [facility])    Chronic occlusion of the right limb of aortic stent graft.  . Diverticulosis    Left sided noted on colonoscopy 02/2006  . Ectopic atrial tachycardia ([place])   . Esophageal reflux disease   . Essential hypertension   . Hiatal hernia   . Hyperlipidemia   . Lumbar radiculopathy   . Macular degeneration   . Migraines   . Non-obstructive CAD    a. Reportedly nonobstructive at catheterization in the 1990s;  b. 03/2014 Cath: LM nl, LAD 30-40p, 80m, D1 20ost, D2 50ost/p, D3 small, LCX nl/nondominant, OM 1/2/3 nl, RCA 20-30p, 26m, PDA nl, RPL1/2  small, nl.  . Penetrating atherosclerotic ulcer of aorta (H[facility])    Complex ulcerated plaque of the infrarenal abdominal aorta mild renal artery stenosis on the right side, normal left renal artery catheterization 2009, status post aortic stent graft  . Renal artery  stenosis ([place])    a. Right renal artery stent April 2013 - Dr. Fletcher Anon;  b. 03/2014 PTA/stenting of RRA 2/2 ISR: 6x18 Herculink stent.  . Trigeminal neuralgia    Right  . Type 2 diabetes mellitus ([place])     Past Surgical History:  Procedure Laterality Date  . ABDOMINAL AORTAGRAM N/A 02/13/2012   Procedure: ABDOMINAL Maxcine Ham;  Surgeon: Wellington Hampshire, MD;  Location:  CATH LAB;  Service: Cardiovascular;  Laterality: N/A;  . ABDOMINAL AORTAGRAM N/A 03/17/2014   Procedure: ABDOMINAL Maxcine Ham;  Surgeon: Wellington Hampshire, MD;  Location: [facility] CATH LAB;  Service: Cardiovascular;  Laterality: N/A;  . ABDOMINAL HYSTERECTOMY  1982  . BREAST BIOPSY Left 1990's  . CAROTID ENDARTERECTOMY Left 1993  . Endologix powerlink bifurcated     Covered stent graft  . LEFT HEART CATHETERIZATION WITH CORONARY ANGIOGRAM N/A 03/17/2014   Procedure: LEFT HEART CATHETERIZATION WITH CORONARY ANGIOGRAM;  Surgeon: Wellington Hampshire, MD;  Location: [facility] CATH LAB;  Service: Cardiovascular;  Laterality: N/A;  . LUMBAR [place]  . PERCUTANEOUS STENT INTERVENTION Right 03/17/2014   Procedure: PERCUTANEOUS STENT INTERVENTION;  Surgeon: Wellington Hampshire, MD;  Location: [facility] CATH LAB;  Service: Cardiovascular;  Laterality: Right;  Renal  . RENAL ANGIOGRAM N/A 02/13/2012   Procedure: RENAL ANGIOGRAM;  Surgeon: Wellington Hampshire, MD;  Location: [facility] CATH LAB;  Service: Cardiovascular;  Laterality: N/A;  . RENAL ANGIOGRAM N/A 03/17/2014  Procedure: RENAL ANGIOGRAM;  Surgeon: Wellington Hampshire, MD;  [facility] C[facility];  Service: Cardiovascular;  Laterality: N/A;  . TONSILLECTOMY AND ADENOIDECTOMY  1947    Current Outpatient Medications  Medication Sig Dispense Refill  . acetaminophen (TYLENOL) 325 MG tablet Take 325 mg by mouth every 6 (six) hours as needed. For pain.    Marland Kitchen amLODipine (NORVASC) 2.5 MG tablet Take 2.5 mg by mouth daily.    Marland Kitchen aspirin EC 81 MG tablet Take 81 mg by mouth at bedtime.     . Cholecalciferol (VITAMIN D3)  1000 UNITS CAPS Take 1,000 capsules by mouth daily.    . clonazePAM (KLONOPIN) 0.5 MG tablet Take 0.125 mg by mouth at bedtime as needed for anxiety.     . cloNIDine (CATAPRES) 0.1 MG tablet Take 0.1 mg by mouth 2 (two) times daily as needed (SBP>170). Reported on 02/15/2016    . labetalol (NORMODYNE) 200 MG tablet Take 0.5 tablets (100 mg total) by mouth 3 (three) times daily.    . Multiple Vitamins-Minerals (PRESERVISION/LUTEIN) CAPS Take 1 capsule by mouth 2 (two) times daily.     . polyethylene glycol (MIRALAX / GLYCOLAX) packet Take 17 g by mouth daily as needed for mild constipation. Constipation    . rosuvastatin (CRESTOR) 5 MG tablet TAKE 1 TABLET DAILY 90 tablet 0   No current facility-administered medications for this visit.    Allergies:  Cefixime, Conjugated estrogens, Morphine and related, Codeine, Iohexol, and Adenosine   Social History: The patient  reports that she quit smoking about 39 years ago. Her smoking use included cigarettes. She has a 0.20 pack-year smoking history. She has never used smokeless tobacco. She reports that she does not drink alcohol or use drugs.   ROS:  Please see the history of present illness. Otherwise, complete review of systems is positive for none.  All other systems are reviewed and negative.   Physical Exam: VS:  BP (!) 150/88   Pulse 87   Ht 5\' 1"  (1.549 m)   Wt 119 lb (54 kg)   SpO2 98%   BMI 22.48 kg/m , BMI Body mass index is 22.48 kg/m.  Wt Readings from Last 3 Encounters:  09/07/19 119 lb (54 kg)  04/03/19 125 lb (56.7 kg)  09/22/18 123 lb (55.8 kg)    General: Elderly woman, appears comfortable at rest. HEENT: Conjunctiva and lids normal, wearing a mask. Neck: Supple, no elevated JVP or carotid bruits, no thyromegaly. Lungs: Clear to auscultation, nonlabored breathing at rest. Cardiac: Regular rate and rhythm, no S3 or significant systolic murmur, no pericardial rub. Abdomen: Soft, nontender, no hepatomegaly, bowel sounds  present. Extremities: No pitting edema, distal pulses 2+.  ECG:  An ECG dated 09/22/2018 was personally reviewed today and demonstrated:  Normal sinus rhythm.  Recent Labwork:  June 2018: Hgb 12.5, platelets 188, TSH 2.91, cholesterol 164, TG 78, HDL 56, LDL 92, BUN 19, creatinine 0.85, potassium 4.5, AST 18, ALT 14  Other Studies Reviewed Today:  Renal artery Dopplers 06/03/2019: Summary: Largest Aortic Diameter: 2.2 cm   Renal:   Right: Normal size right kidney. Cyst(s) noted. RRV flow present. No        evidence of right renal artery stenosis. Normal cortical        thickness of right kidney. Patent right renal artery stent. Left:  Normal size of left kidney. Normal cortical thickness of the        left kidney. No evidence of left renal artery stenosis. Mesenteric:  Normal Celiac artery and Superior Mesenteric artery findings. Patent IVC.  Echocardiogram 03/09/2015: Study Conclusions  - Left ventricle: The cavity size was normal. Systolic function was normal. The estimated ejection fraction was in the range of 55% to 60%. Wall motion was normal; there were no regional wall motion abnormalities. Doppler parameters are consistent with abnormal left ventricular relaxation (grade 1 diastolic dysfunction). Doppler parameters are consistent with high ventricular filling pressure. Mild focal basal septal hypertrophy. - Aortic valve: Mildly calcified annulus. Trileaflet; mildly thickened leaflets. - Mitral valve: Mild to moderately thickened leaflets . There was trivial regurgitation. - Left atrium: The atrium was mildly dilated. - Tricuspid valve: There was mild-moderate regurgitation. - Pulmonic valve: There was mild regurgitation.  Assessment and Plan:  1.  Essential hypertension with intermittent spikes in blood pressure.  I talked with her about considering a change from labetalol to a different preparation such as Coreg, for now she did not want to make  any adjustments but she will call back and let us know depending on blood pressure at home.  Otherwise continue low-dose Norvasc which she is tolerating.  Clonidine can be used for high blood pressure spikes.  2.  History of renal artery stenosis status post stent intervention to the right renal artery in 2013 in 2015.  Follow-up Dopplers in July showed no evidence of restenosis.  Medication Adjustments/Labs and Tests Ordered: Current medicines are reviewed at length with the patient today.  Concerns regarding medicines are outlined above.   Tests Ordered: No orders of the defined types were placed in this encounter.   Medication Changes: No orders of the defined types were placed in this encounter.   Disposition:  Follow up 6 months in the [place] office.  Signed, [name], MD, [facility] 09/07/2019 12:04 PM    [facility] [place], [place], [place] [address] Phone: [phone]; Fax: [phone]

## 2019-09-07 NOTE — Patient Instructions (Signed)

## 2019-09-08 DIAGNOSIS — I10 Essential (primary) hypertension: Secondary | ICD-10-CM | POA: Diagnosis not present

## 2019-09-09 ENCOUNTER — Telehealth: Payer: Self-pay | Admitting: Cardiology

## 2019-09-09 NOTE — Telephone Encounter (Signed)
Patient called stating that she is very weak today with a bad headache.  States that her BP continues to go up and down.  BP  169/101 @  900 am  BP   143./74  @ 915am

## 2019-09-09 NOTE — Telephone Encounter (Addendum)
Per recent office visit on 09/07/2019-- Essential hypertension with intermittent spikes in blood pressure.  I talked with her about considering a change from labetalol to a different preparation such as Coreg, for now she did not want to make any adjustments but she will call back and let us know depending on blood pressure at home.  Otherwise continue low-dose Norvasc which she is tolerating.  Clonidine can be used for high blood pressure spikes.  Discussed above plan with patient and she reports feeling better and BP 147/77 after taking clonidine 0.1 mg at 12:47 pm. Reports having headaches all the time and when the headache gets worse, her BP goes up. Advised patient that she needed to contact her PCP about managing her headaches since this could be causing the elevation in her BP. Verbalized understanding.

## 2019-09-11 DIAGNOSIS — Z6822 Body mass index (BMI) 22.0-22.9, adult: Secondary | ICD-10-CM | POA: Diagnosis not present

## 2019-09-11 DIAGNOSIS — R519 Headache, unspecified: Secondary | ICD-10-CM | POA: Diagnosis not present

## 2019-09-11 DIAGNOSIS — Z299 Encounter for prophylactic measures, unspecified: Secondary | ICD-10-CM | POA: Diagnosis not present

## 2019-09-11 DIAGNOSIS — E785 Hyperlipidemia, unspecified: Secondary | ICD-10-CM | POA: Diagnosis not present

## 2019-09-11 DIAGNOSIS — I10 Essential (primary) hypertension: Secondary | ICD-10-CM | POA: Diagnosis not present

## 2019-09-11 DIAGNOSIS — E1165 Type 2 diabetes mellitus with hyperglycemia: Secondary | ICD-10-CM | POA: Diagnosis not present

## 2019-09-14 DIAGNOSIS — Z23 Encounter for immunization: Secondary | ICD-10-CM | POA: Diagnosis not present

## 2019-09-25 DIAGNOSIS — R0789 Other chest pain: Secondary | ICD-10-CM | POA: Diagnosis not present

## 2019-09-25 DIAGNOSIS — R519 Headache, unspecified: Secondary | ICD-10-CM | POA: Diagnosis not present

## 2019-09-25 DIAGNOSIS — R079 Chest pain, unspecified: Secondary | ICD-10-CM | POA: Diagnosis not present

## 2019-09-25 DIAGNOSIS — K219 Gastro-esophageal reflux disease without esophagitis: Secondary | ICD-10-CM | POA: Diagnosis not present

## 2019-09-25 DIAGNOSIS — E1165 Type 2 diabetes mellitus with hyperglycemia: Secondary | ICD-10-CM | POA: Diagnosis not present

## 2019-09-25 DIAGNOSIS — Z299 Encounter for prophylactic measures, unspecified: Secondary | ICD-10-CM | POA: Diagnosis not present

## 2019-09-25 DIAGNOSIS — I10 Essential (primary) hypertension: Secondary | ICD-10-CM | POA: Diagnosis not present

## 2019-09-25 DIAGNOSIS — Z6822 Body mass index (BMI) 22.0-22.9, adult: Secondary | ICD-10-CM | POA: Diagnosis not present

## 2019-09-29 ENCOUNTER — Telehealth: Payer: Self-pay | Admitting: Cardiology

## 2019-09-29 NOTE — Telephone Encounter (Signed)
No c/o active chest pain.  Notices mostly with exertion after about 10-15 minutes.  Usually is bad enough for her to sit down.  After she sits down for a few minutes, she feels back to normal.  Does notice some SOB during episode.  Does not have Nitroglycerin to take at home.  Been bothering off/on x 10-14 days.

## 2019-09-29 NOTE — Telephone Encounter (Signed)
Patient notified.  Stated she will think about her options and discuss with husband & get back to [place] after she decides.

## 2019-09-29 NOTE — Telephone Encounter (Signed)
Pt c/o of Chest Pain: 1. Are you having CP right now? No , off and on for several days.  2. Are you experiencing any other symptoms (ex. SOB, nausea, vomiting, sweating)?  No states that her chest gets tight when she starts moving around.  3. How long have you been experiencing CP? Several days with a headache 4. Is your CP continuous or coming and going? Yes  5. Have you taken Nitroglycerin? No   BP  164/74 HR 98

## 2019-09-29 NOTE — Telephone Encounter (Signed)
Previous cardiac work-up in 2015 showed nonobstructive CAD.  Symptoms could potentially be anginal however.  If she is amenable, consider arranging Lexiscan Myoview to reevaluate for ischemia.  I reviewed her current medications. If she would like to empirically try a nitrate, could consider starting Imdur at 15 mg daily.

## 2019-10-12 NOTE — Telephone Encounter (Signed)
Mrs. Federer called this morning stating that she continues to have chest pains.

## 2019-10-13 NOTE — Telephone Encounter (Signed)
Reports chest pain is about the same. Reports she has a lot of gas. Patient says that she wanders if she needed an endoscopy procedure. Advised that this is something she can speak with her family doctor about. Reminded patient of the options below that were mentioned 2 weeks ago, (lexiscan or imdur 15 mg). Patient declines at this time and will contact our office if she decides on one or the other.

## 2019-10-14 ENCOUNTER — Encounter: Payer: Self-pay | Admitting: *Deleted

## 2019-10-14 ENCOUNTER — Telehealth: Payer: Self-pay | Admitting: Cardiology

## 2019-10-14 DIAGNOSIS — R079 Chest pain, unspecified: Secondary | ICD-10-CM

## 2019-10-14 DIAGNOSIS — I2510 Atherosclerotic heart disease of native coronary artery without angina pectoris: Secondary | ICD-10-CM

## 2019-10-14 NOTE — Telephone Encounter (Signed)
Would like to have test

## 2019-10-14 NOTE — Telephone Encounter (Signed)
Patient informed that she would be contacted with her appointment information. Lexiscan instructions read and mailed to patient.

## 2019-10-14 NOTE — Telephone Encounter (Signed)
[name], MD  09/29/2019 to Laurine Blazer, LPN      [id number] AM Note   Previous cardiac work-up in 2015 showed nonobstructive CAD.  Symptoms could potentially be anginal however.  If she is amenable, consider arranging Lexiscan Myoview to reevaluate for ischemia.  I reviewed her current medications. If she would like to empirically try a nitrate, could consider starting Imdur at 15 mg daily.

## 2019-10-15 ENCOUNTER — Telehealth: Payer: Self-pay | Admitting: Cardiology

## 2019-10-15 NOTE — Telephone Encounter (Signed)
Lexiscan instructions read to patient with specifics for no food or drink including caffeine or decaff products 6 hours before the test. Advised to eat extra food before going to bed. Verbalized understanding of plan.

## 2019-10-15 NOTE — Telephone Encounter (Signed)
Has questions about not being able to eat for so long for lexiscan. Also asked about being able to sip on ginger ale

## 2019-10-15 NOTE — Telephone Encounter (Signed)
°  Precert needed for: Lexiscan    Location: [facility]    Date: Oct 28, 2019

## 2019-10-16 ENCOUNTER — Telehealth: Payer: Self-pay | Admitting: Cardiology

## 2019-10-16 NOTE — Telephone Encounter (Signed)
Attempted to return call - voice mailbox not set up.

## 2019-10-16 NOTE — Telephone Encounter (Signed)
Pt c/o of Chest Pain: 1. Are you having CP right now? Off and on  For 2 weeks now.  2. Are you experiencing any other symptoms (ex. SOB, nausea, vomiting, sweating)? Shortness of breath.  3. How long have you been experiencing CP? 2 weeks  4. Is your CP continuous or coming and going? Both  5. Have you taken Nitroglycerin? No

## 2019-10-20 ENCOUNTER — Telehealth: Payer: Self-pay | Admitting: Cardiology

## 2019-10-20 NOTE — Telephone Encounter (Signed)
Patient called regarding her stress test for 10-21-2019 at [facility]. She needs someone to verify her medications and protocol.

## 2019-10-20 NOTE — Telephone Encounter (Signed)
Lexiscan scheduled for 10/21/2019. Patient aware.

## 2019-10-20 NOTE — Telephone Encounter (Signed)
Patient contacted and lexiscan instructions read to patient again. Verbalized understanding of plan.

## 2019-10-20 NOTE — Telephone Encounter (Signed)
percert for Stress test- rescheduled for 10-21-2019 at [facility]

## 2019-10-21 ENCOUNTER — Encounter (HOSPITAL_BASED_OUTPATIENT_CLINIC_OR_DEPARTMENT_OTHER)
Admission: RE | Admit: 2019-10-21 | Discharge: 2019-10-21 | Disposition: A | Discharge status: Home / Self Care | Payer: Medicare Other | Source: Ambulatory Visit | Attending: Cardiology | Admitting: Cardiology

## 2019-10-21 ENCOUNTER — Telehealth: Payer: Self-pay | Admitting: *Deleted

## 2019-10-21 ENCOUNTER — Ambulatory Visit (HOSPITAL_COMMUNITY)
Admission: RE | Admit: 2019-10-21 | Discharge: 2019-10-21 | Disposition: A | Discharge status: Home / Self Care | Payer: Medicare Other | Source: Ambulatory Visit | Attending: Cardiology | Admitting: Cardiology

## 2019-10-21 ENCOUNTER — Other Ambulatory Visit: Payer: Self-pay

## 2019-10-21 ENCOUNTER — Encounter (HOSPITAL_COMMUNITY): Payer: Self-pay

## 2019-10-21 DIAGNOSIS — I2510 Atherosclerotic heart disease of native coronary artery without angina pectoris: Secondary | ICD-10-CM

## 2019-10-21 DIAGNOSIS — R079 Chest pain, unspecified: Secondary | ICD-10-CM

## 2019-10-21 LAB — NM MYOCAR MULTI W/SPECT W/WALL MOTION / EF
LV dias vol: 61 mL (ref 46–106)
LV sys vol: 27 mL
Peak HR: 136 {beats}/min
RATE: 0.34
Rest HR: 75 {beats}/min
SDS: 1
SRS: 6
SSS: 7
TID: 1

## 2019-10-21 MED ORDER — SODIUM CHLORIDE FLUSH 0.9 % IV SOLN
INTRAVENOUS | Status: AC
  Administered 2019-10-21: 10 mL via INTRAVENOUS
  Filled 2019-10-21: qty 10

## 2019-10-21 MED ORDER — TECHNETIUM TC 99M TETROFOSMIN IV KIT
10.0000 | PACK | Freq: Once | INTRAVENOUS | Status: AC | PRN
  Administered 2019-10-21: 11 via INTRAVENOUS

## 2019-10-21 MED ORDER — ISOSORBIDE MONONITRATE ER 30 MG PO TB24: 15.0000 mg | ORAL_TABLET | Freq: Every evening | ORAL | 1 refills | Status: DC

## 2019-10-21 MED ORDER — REGADENOSON 0.4 MG/5ML IV SOLN
INTRAVENOUS | Status: AC
  Administered 2019-10-21: 0.4 mg via INTRAVENOUS
  Filled 2019-10-21: qty 5

## 2019-10-21 MED ORDER — TECHNETIUM TC 99M TETROFOSMIN IV KIT
30.0000 | PACK | Freq: Once | INTRAVENOUS | Status: AC | PRN
  Administered 2019-10-21: 31 via INTRAVENOUS

## 2019-10-21 NOTE — Telephone Encounter (Signed)
Patient informed and verbalized understanding of plan. Agrees to starting imdur 15 mg daily at this time since she did not agree in the past. Copy sent to PCP

## 2019-10-21 NOTE — Telephone Encounter (Signed)
-----   Message from [name], MD sent at 10/21/2019  1:32 PM EST ----- Results reviewed.  I reviewed the images as well.  She had an abnormal ECG response to stress.  Perfusion imaging shows a partially reversible inferior defect that is also affected by gut radiotracer artifact.  This study does not exclude progression in CAD compared to previous assessment from 2015 and therefore we should consider further up titration of antianginal therapy.  I did mention starting low-dose Imdur in prior telephone note.  Please make sure that we already have follow-up arranged as well.

## 2019-10-23 ENCOUNTER — Encounter: Payer: Self-pay | Admitting: Cardiology

## 2019-10-23 ENCOUNTER — Ambulatory Visit (INDEPENDENT_AMBULATORY_CARE_PROVIDER_SITE_OTHER): Payer: Medicare Other | Admitting: Cardiology

## 2019-10-23 ENCOUNTER — Other Ambulatory Visit: Payer: Self-pay | Admitting: Cardiology

## 2019-10-23 ENCOUNTER — Other Ambulatory Visit: Payer: Self-pay

## 2019-10-23 VITALS — BP 190/89 | HR 83 | Ht 61.5 in | Wt 118.4 lb

## 2019-10-23 DIAGNOSIS — I25119 Atherosclerotic heart disease of native coronary artery with unspecified angina pectoris: Secondary | ICD-10-CM | POA: Diagnosis not present

## 2019-10-23 DIAGNOSIS — I10 Essential (primary) hypertension: Secondary | ICD-10-CM

## 2019-10-23 DIAGNOSIS — I701 Atherosclerosis of renal artery: Secondary | ICD-10-CM | POA: Diagnosis not present

## 2019-10-23 DIAGNOSIS — Z8679 Personal history of other diseases of the circulatory system: Secondary | ICD-10-CM | POA: Diagnosis not present

## 2019-10-23 MED ORDER — AMLODIPINE BESYLATE 5 MG PO TABS: 5.0000 mg | ORAL_TABLET | Freq: Every day | ORAL | 1 refills | Status: DC

## 2019-10-23 NOTE — Progress Notes (Signed)
Cardiology Office Note  Date: 10/23/2019   ID: [name], DOB 16-Sep-1936, MRN [id number]  PCP:  Glenda Chroman, MD  Cardiologist:  Rozann Lesches, MD Electrophysiologist:  None   Chief Complaint  Patient presents with  . Cardiac follow-up    History of Present Illness: [name] is an 83 y.o. female last seen in October.  She presents for a follow-up visit.  She still reports intermittent chest tightness concerning for angina, although has a burning feeling as well and intermittent belching raising possibility of GI etiology.  Exercise/Lexiscan Myoview performed recently was abnormal but overall low risk.  Abnormal ST segment changes noted.  There was a partially reversible inferior defect in the setting of gut radiotracer artifact.  Overall, study does not exclude some progression in CAD compared to prior evaluation in 2015.  Today we discussed her symptoms and plan to treat for angina.  She is already on a proton pump inhibitor.  We went over her medications and plan to increase Norvasc to 5 mg daily, also add Imdur 15 mg once in the evening.  Labetalol will be twice daily and otherwise no changes.  Past Medical History:  Diagnosis Date  . Anemia   . Anxiety disorder   . Arthritis   . Chronic constipation   . Claudication [facility])    Chronic occlusion of the right limb of aortic stent graft.  . Diverticulosis    Left sided noted on colonoscopy 02/2006  . Ectopic atrial tachycardia ([place])   . Esophageal reflux disease   . Essential hypertension   . Hiatal hernia   . Hyperlipidemia   . Lumbar radiculopathy   . Macular degeneration   . Migraines   . Non-obstructive CAD    a. Reportedly nonobstructive at catheterization in the 1990s;  b. 03/2014 Cath: LM nl, LAD 30-40p, 68m, D1 20ost, D2 50ost/p, D3 small, LCX nl/nondominant, OM 1/2/3 nl, RCA 20-30p, 54m, PDA nl, RPL1/2  small, nl.  . Penetrating atherosclerotic ulcer of aorta (HCC)    Complex ulcerated plaque of the  infrarenal abdominal aorta mild renal artery stenosis on the right side, normal left renal artery catheterization 2009, status post aortic stent graft  . Renal artery stenosis ([place])    a. Right renal artery stent April 2013 - Dr. Fletcher Anon;  b. 03/2014 PTA/stenting of RRA 2/2 ISR: 6x18 Herculink stent.  . Trigeminal neuralgia    Right  . Type 2 diabetes mellitus ([place])     Past Surgical History:  Procedure Laterality Date  . ABDOMINAL AORTAGRAM N/A 02/13/2012   Procedure: ABDOMINAL Maxcine Ham;  Surgeon: Wellington Hampshire, MD;  Location: [facility] CATH LAB;  Service: Cardiovascular;  Laterality: N/A;  . ABDOMINAL AORTAGRAM N/A 03/17/2014   Procedure: ABDOMINAL Maxcine Ham;  Surgeon: Wellington Hampshire, MD;  Location: [facility] CATH [facility];  Service: Cardiovascular;  Laterality: N/A;  . ABDOMINAL HYSTERECTOMY  1982  . BREAST BIOPSY Left 1990's  . CAROTID ENDARTERECTOMY Left 1993  . Endologix powerlink bifurcated     Covered stent graft  . LEFT HEART CATHETERIZATION WITH CORONARY ANGIOGRAM N/A 03/17/2014   Procedure: LEFT HEART CATHETERIZATION WITH CORONARY ANGIOGRAM;  Surgeon: Wellington Hampshire, MD;  Location: [facility] CATH LAB;  Service: Cardiovascular;  Laterality: N/A;  . LUMBAR [place]  . PERCUTANEOUS STENT INTERVENTION Right 03/17/2014   Procedure: PERCUTANEOUS STENT INTERVENTION;  Surgeon: Wellington Hampshire, MD;  Location: [facility] CATH LAB;  Service: Cardiovascular;  Laterality: Right;  Renal  . RENAL ANGIOGRAM N/A  02/13/2012   Procedure: RENAL ANGIOGRAM;  Surgeon: Wellington Hampshire, MD;  Location: [facility] CATH LAB;  Service: Cardiovascular;  Laterality: N/A;  . RENAL ANGIOGRAM N/A 03/17/2014   Procedure: RENAL ANGIOGRAM;  Surgeon: Wellington Hampshire, MD;  Location: [facility] CATH LAB;  Service: Cardiovascular;  Laterality: N/A;  . TONSILLECTOMY AND ADENOIDECTOMY  1947    Current Outpatient Medications  Medication Sig Dispense Refill  . acetaminophen (TYLENOL) 325 MG tablet Take 325 mg by mouth every 6 (six) hours as needed. For pain.     Marland Kitchen aspirin EC 81 MG tablet Take 81 mg by mouth at bedtime.     . Cholecalciferol (VITAMIN D3) 1000 UNITS CAPS Take 1,000 capsules by mouth daily.    . clonazePAM (KLONOPIN) 0.5 MG tablet Take 0.125 mg by mouth at bedtime as needed for anxiety.     . cloNIDine (CATAPRES) 0.1 MG tablet Take 0.1 mg by mouth 2 (two) times daily as needed (SBP>170). Reported on 02/15/2016    . isosorbide mononitrate (IMDUR) 30 MG 24 hr tablet Take 15 mg by mouth at bedtime.    Marland Kitchen labetalol (NORMODYNE) 100 MG tablet Take 100 mg by mouth 2 (two) times daily.    . Multiple Vitamins-Minerals (PRESERVISION/LUTEIN) CAPS Take 1 capsule by mouth 2 (two) times daily.     . polyethylene glycol (MIRALAX / GLYCOLAX) packet Take 17 g by mouth daily as needed for mild constipation. Constipation    . rosuvastatin (CRESTOR) 5 MG tablet TAKE 1 TABLET DAILY 90 tablet 3  . amLODipine (NORVASC) 5 MG tablet Take 1 tablet (5 mg total) by mouth daily. 90 tablet 1   No current facility-administered medications for this visit.   Allergies:  Cefixime, Conjugated estrogens, Morphine and related, Codeine, Iohexol, and Adenosine   Social History: The patient  reports that she quit smoking about 39 years ago. Her smoking use included cigarettes. She has a 0.20 pack-year smoking history. She has never used smokeless tobacco. She reports that she does not drink alcohol or use drugs.   ROS:  Please see the history of present illness. Otherwise, complete review of systems is positive for anxiety.  All other systems are reviewed and negative.   Physical Exam: VS:  BP (!) 190/89   Pulse 83   Ht 5' 1.5" (1.562 m)   Wt 118 lb 6.4 oz (53.7 kg)   SpO2 96%   BMI 22.01 kg/m , BMI Body mass index is 22.01 kg/m.  Wt Readings from Last 3 Encounters:  10/23/19 118 lb 6.4 oz (53.7 kg)  09/07/19 119 lb (54 kg)  04/03/19 125 lb (56.7 kg)    General: Elderly woman, appears comfortable at rest. HEENT: Conjunctiva and lids normal, wearing a mask. Neck:  Supple, no elevated JVP or carotid bruits, no thyromegaly. Lungs: Clear to auscultation, nonlabored breathing at rest. Cardiac: Regular rate and rhythm, no S3 or significant systolic murmur. Abdomen: Soft, nontender, bowel sounds present. Extremities: No pitting edema, distal pulses 2+. Skin: Warm and dry. Musculoskeletal: No kyphosis. Neuropsychiatric: Alert and oriented x3, affect grossly appropriate.  ECG:  An ECG dated 09/22/2018 was personally reviewed today and demonstrated:  Normal sinus rhythm.  Recent Labwork:  June 2018: Hgb 12.5, platelets 188, TSH 2.91, cholesterol 164, TG 78, HDL 56, LDL 92, BUN 19, creatinine 0.85, potassium 4.5, AST 18, ALT 14  Other Studies Reviewed Today:  Cardiac catheterization 03/17/2014: Hemodynamics: AO:  209/90   mmHg LV:  206/22    mmHg LVEDP:  44  mmHg  Coronary angiography: Coronary dominance:  Right   Left Main:  The left coronary system is heavily calcified. The left main is short with minor irregularities.  Left Anterior Descending (LAD):  Normal in size. There is 30-40% proximal tubular stenosis followed by 40% disease in the midsegment. Minor irregularities are noted distally.  1st diagonal (D1):  Normal in size with 20% ostial stenosis.  2nd diagonal (D2):  Medium in size with 50% ostial and proximal stenosis.  3rd diagonal (D3):  Very small in size.  Circumflex (LCx):  Normal in size and nondominant. The vessel has mild diffuse atherosclerosis without obstructive disease.  1st obtuse marginal:  Very small in size.  2nd obtuse marginal:  Very small in size.  3rd obtuse marginal:  Normal in size with no significant disease.        Right Coronary Artery:  Severely calcified and tortuous. There is 20-30% proximal stenosis and 40% mid stenosis. The rest of the vessel has minor irregularities.  Posterior descending artery:  Normal in size with minor irregularities.  Posterior AV segment:  Normal in size without significant  disease.  Posterolateral branchs:  PL 1 is very small in size. PL 2 is normal in size with minor irregularities.  Left ventriculography:  Was not performed.  PV procedure note:: Abdominal aortogram and nonselective renal angiography was performed with a pigtail catheter in the AP position. This showed 90% ostial stenosis in the right renal artery. It appears that the previously placed stent was 1-2 mm short of the ostium. A stent graft is noted in the aorta. It appears to be intact. The right iliac limb is known to be occluded. The patient was given 3500 of unfractionated heparin followed by another 1500 units of heparin to achieve an ACT slightly above 200. I used an IM 6 [place] guide to engage the right renal artery. Selective angiography was performed. The lesion was crossed with a [place] core wire. I then used a 5 x 15 mm balloon to predilate the lesion. I decided to place another stent in order to cover the ostium and used a 6 x 18 mm Herculink stent which was deployed to 10 atmosphere. The balloon was pulled back and inflated again at the ostium to 12 atmospheres. Final angiography showed excellent results. The catheter and wire were removed. The site was closed with a Mynx device.  Final Conclusions:   1. Mild to moderate nonobstructive coronary artery disease with heavily calcified vessels. 2. Severely elevated blood pressure of and left ventricular end-diastolic pressure. 3. Severe ostial right renal artery stenosis. 4. Successful angioplasty and stent placement to the right renal artery.  Renal artery Doppler 06/03/2019: Summary: Largest Aortic Diameter: 2.2 cm   Renal:   Right: Normal size right kidney. Cyst(s) noted. RRV flow present. No        evidence of right renal artery stenosis. Normal cortical        thickness of right kidney. Patent right renal artery stent. Left:  Normal size of left kidney. Normal cortical thickness of the        left kidney. No evidence of left  renal artery stenosis. Mesenteric: Normal Celiac artery and Superior Mesenteric artery findings. Patent IVC.  Assessment and Plan:  1.  CAD with recurrent angina (although there could be a GI component to some of her symptoms).  Recent Myoview was abnormal but low risk and does not exclude progression in CAD compared to 2015 at which point she had mild to moderate disease.  Plan to continue medical therapy which now includes aspirin, Norvasc which will be increased to 5 mg daily, labetalol, Crestor, and addition of low-dose Imdur.  We will plan follow-up in the office.  If symptoms progressed despite medication adjustments may need to consider cardiac catheterization.  2.  Essential hypertension, not optimally controlled and complicated by medication intolerances.  We are increasing Norvasc as antianginal, she also has clonidine to use as needed.  3.  Renal artery stenosis status post stent intervention to the right renal artery in 2013 in 2015.  Dopplers from July showed no evidence of restenosis.  Medication Adjustments/Labs and Tests Ordered: Current medicines are reviewed at length with the patient today.  Concerns regarding medicines are outlined above.   Tests Ordered: No orders of the defined types were placed in this encounter.   Medication Changes: Meds ordered this encounter  Medications  . amLODipine (NORVASC) 5 MG tablet    Sig: Take 1 tablet (5 mg total) by mouth daily.    Dispense:  90 tablet    Refill:  1    DOSE INCREASE 10/23/2019    Disposition:  Follow up 1 month in the [place] office.  Signed, [name], MD, [facility] 10/23/2019 2:19 PM    [place] [facility] [place], [place], [place] [address] Phone: [phone]; Fax: [phone]

## 2019-10-23 NOTE — Patient Instructions (Signed)
Your physician recommends that you schedule a follow-up appointment in: [place] has recommended you make the following change in your medication:   INCREASE AMLODIPINE 5 MG DAILY   TAKE IMDUR 15 MG (1/2 TABLET) EVERY EVENING  Thank you for choosing [facility]!!

## 2019-10-27 ENCOUNTER — Telehealth: Payer: Self-pay | Admitting: Cardiology

## 2019-10-27 NOTE — Telephone Encounter (Signed)
Patient states that she has only took the medication x 2 nights due to being afraid to take it, but she does think that it is helping though.  States the medication gives her a curious feeling all day, keeping her awake & heart pounding.  Reminded her that she may continue to feel better the longer she is on this medication & the symptoms may go away.  Also, reminded her to take Tylenol as needed for the headache.  Message fwd to provider for any further advice.

## 2019-10-27 NOTE — Telephone Encounter (Signed)
Thank you for updating me.  Agree, let's stick with the plan if she can.

## 2019-10-27 NOTE — Telephone Encounter (Signed)
Imdur - keeping her awake  Has not slept for two nights. Stated her heart pounds all day.  Said her heart beats are irregular

## 2019-10-27 NOTE — Telephone Encounter (Signed)
Left message to return call 

## 2019-10-27 NOTE — Addendum Note (Signed)
Addended by: Laurine Blazer on: 10/27/2019 10:06 AM   Modules accepted: Orders

## 2019-10-27 NOTE — Telephone Encounter (Signed)
Patient notified and verbalized understanding. 

## 2019-10-28 ENCOUNTER — Encounter (HOSPITAL_COMMUNITY): Payer: Medicare Other

## 2019-11-10 DIAGNOSIS — I10 Essential (primary) hypertension: Secondary | ICD-10-CM | POA: Diagnosis not present

## 2019-11-26 ENCOUNTER — Ambulatory Visit (INDEPENDENT_AMBULATORY_CARE_PROVIDER_SITE_OTHER): Payer: Medicare Other | Admitting: Cardiology

## 2019-11-26 ENCOUNTER — Other Ambulatory Visit: Payer: Self-pay

## 2019-11-26 ENCOUNTER — Encounter: Payer: Self-pay | Admitting: Cardiology

## 2019-11-26 VITALS — BP 150/72 | HR 82 | Ht 61.5 in | Wt 128.0 lb

## 2019-11-26 DIAGNOSIS — E782 Mixed hyperlipidemia: Secondary | ICD-10-CM

## 2019-11-26 DIAGNOSIS — I25119 Atherosclerotic heart disease of native coronary artery with unspecified angina pectoris: Secondary | ICD-10-CM | POA: Diagnosis not present

## 2019-11-26 DIAGNOSIS — Z8679 Personal history of other diseases of the circulatory system: Secondary | ICD-10-CM

## 2019-11-26 DIAGNOSIS — I10 Essential (primary) hypertension: Secondary | ICD-10-CM | POA: Diagnosis not present

## 2019-11-26 MED ORDER — ISOSORBIDE MONONITRATE ER 30 MG PO TB24: 30.0000 mg | ORAL_TABLET | Freq: Every day | ORAL | 2 refills | Status: DC

## 2019-11-26 NOTE — Patient Instructions (Addendum)
Medication Instructions:   Your physician has recommended you make the following change in your medication:   Increase isosorbide mononitrate to 30 mg by mouth daily  Continue other medications the same  Labwork:  Pending if heart cath is done  Testing/Procedures:  Pending heart cath-call office if you decide to have this done  Follow-Up:  Your physician recommends that you schedule a follow-up appointment in: 4 weeks (office).  Any Other Special Instructions Will Be Listed Below (If Applicable).  If you need a refill on your cardiac medications before your next appointment, please call your pharmacy.

## 2019-11-26 NOTE — Progress Notes (Signed)
Cardiology Office Note  Date: 11/26/2019   ID: [name], DOB [birth year], MRN [id number]  PCP:  Glenda Chroman, MD  Cardiologist:  Rozann Lesches, MD Electrophysiologist:  None   Chief Complaint  Patient presents with  . Cardiac follow-up    History of Present Illness: [name] is an 84 y.o. female last seen in December 2020.  She presents for a follow-up visit.  Still describing episodes of chest pain consistent with angina, but also has reflux.  We adjusted her medications the last visit with increase in Norvasc and addition of Imdur.  She has avoided walking to her mailbox recently due to chest pain.  I talked with her about a diagnostic cardiac catheterization, she is still somewhat hesitant to consider this now, but plans to think about it more and talk with her daughter.  Current cardiac regimen is outlined below.  She reports compliance with her medications and no obvious intolerances of current regimen.  We will increase her Imdur further pending further decisions.  Past Medical History:  Diagnosis Date  . Anemia   . Anxiety   . Arthritis   . Chronic constipation   . Claudication [facility])    Chronic occlusion of the right limb of aortic stent graft.  . Diverticulosis    Left sided noted on colonoscopy 02/2006  . Ectopic atrial tachycardia ([place])   . Esophageal reflux disease   . Essential hypertension   . Hiatal hernia   . Hyperlipidemia   . Lumbar radiculopathy   . Macular degeneration   . Migraines   . Non-obstructive CAD    a. Reportedly nonobstructive at catheterization in the 1990s;  b. 03/2014 Cath: LM nl, LAD 30-40p, 47m, D1 20ost, D2 50ost/p, D3 small, LCX nl/nondominant, OM 1/2/3 nl, RCA 20-30p, 42m, PDA nl, RPL1/2  small, nl.  . Penetrating atherosclerotic ulcer of aorta (HCC)    Complex ulcerated plaque of the infrarenal abdominal aorta mild renal artery stenosis on the right side, normal left renal artery catheterization 2009, status post aortic  stent graft  . Renal artery stenosis ([place])    a. Right renal artery stent April 2013 - Dr. Fletcher Anon;  b. 03/2014 PTA/stenting of RRA 2/2 ISR: 6x18 Herculink stent.  . Trigeminal neuralgia    Right  . Type 2 diabetes mellitus ([place])     Past Surgical History:  Procedure Laterality Date  . ABDOMINAL AORTAGRAM N/A 02/13/2012   Procedure: ABDOMINAL Maxcine Ham;  Surgeon: Wellington Hampshire, MD;  Location: [facility] CATH LAB;  Service: Cardiovascular;  Laterality: N/A;  . ABDOMINAL AORTAGRAM N/A 03/17/2014   Procedure: ABDOMINAL Maxcine Ham;  Surgeon: Wellington Hampshire, MD;  Location: [facility] CATH LAB;  Service: Cardiovascular;  Laterality: N/A;  . ABDOMINAL HYSTERECTOMY  1982  . BREAST BIOPSY Left 1990's  . CAROTID ENDARTERECTOMY Left 1993  . Endologix powerlink bifurcated     Covered stent graft  . LEFT HEART CATHETERIZATION WITH CORONARY ANGIOGRAM N/A 03/17/2014   Procedure: LEFT HEART CATHETERIZATION WITH CORONARY ANGIOGRAM;  Surgeon: Wellington Hampshire, MD;  Location: [facility] CATH LAB;  Service: Cardiovascular;  Laterality: N/A;  . LUMBAR Finger  . PERCUTANEOUS STENT INTERVENTION Right 03/17/2014   Procedure: PERCUTANEOUS STENT INTERVENTION;  Surgeon: Wellington Hampshire, MD;  Location: [facility] CATH LAB;  Service: Cardiovascular;  Laterality: Right;  Renal  . RENAL ANGIOGRAM N/A 02/13/2012   Procedure: RENAL ANGIOGRAM;  Surgeon: Wellington Hampshire, MD;  Location: [facility] CATH LAB;  Service: Cardiovascular;  Laterality: N/A;  .  RENAL ANGIOGRAM N/A 03/17/2014   Procedure: RENAL ANGIOGRAM;  Surgeon: Wellington Hampshire, MD;  Location: [facility] CATH LAB;  Service: Cardiovascular;  Laterality: N/A;  . TONSILLECTOMY AND ADENOIDECTOMY  1947    Current Outpatient Medications  Medication Sig Dispense Refill  . acetaminophen (TYLENOL) 325 MG tablet Take 325 mg by mouth every 6 (six) hours as needed. For pain.    Marland Kitchen amLODipine (NORVASC) 5 MG tablet Take 1 tablet (5 mg total) by mouth daily. 90 tablet 1  . aspirin EC 81 MG tablet Take 81 mg by  mouth at bedtime.     . Cholecalciferol (VITAMIN D3) 1000 UNITS CAPS Take 1,000 capsules by mouth daily.    . clonazePAM (KLONOPIN) 0.5 MG tablet Take 0.125 mg by mouth at bedtime as needed for anxiety.     . cloNIDine (CATAPRES) 0.1 MG tablet Take 0.1 mg by mouth 2 (two) times daily as needed (SBP>170). Reported on 02/15/2016    . isosorbide mononitrate (IMDUR) 30 MG 24 hr tablet Take 1 tablet (30 mg total) by mouth at bedtime. 90 tablet 2  . labetalol (NORMODYNE) 100 MG tablet Take 100 mg by mouth 2 (two) times daily.    . Multiple Vitamins-Minerals (PRESERVISION/LUTEIN) CAPS Take 1 capsule by mouth 2 (two) times daily.     . polyethylene glycol (MIRALAX / GLYCOLAX) packet Take 17 g by mouth daily as needed for mild constipation. Constipation    . rosuvastatin (CRESTOR) 5 MG tablet TAKE 1 TABLET DAILY 90 tablet 3   No current facility-administered medications for this visit.   Allergies:  Cefixime, Conjugated estrogens, Morphine and related, Codeine, Iohexol, and Adenosine   Social History: The patient  reports that she quit smoking about 40 years ago. Her smoking use included cigarettes. She has a 0.20 pack-year smoking history. She has never used smokeless tobacco. She reports that she does not drink alcohol or use drugs.   Family History: The patient's family history includes Coronary artery disease in her mother; Heart attack in her father; Heart disease in her brother, father, and mother; Heart failure in her mother; Hypertension in her brother, father, mother, sister, and sister; Kidney disease in her sister; Parkinson's disease in her sister; Stroke in her mother, sister, and sister.   ROS:  Please see the history of present illness. Otherwise, complete review of systems is positive for none.  All other systems are reviewed and negative.   Physical Exam: VS:  BP (!) 150/72   Pulse 82   Ht 5' 1.5" (1.562 m)   Wt 128 lb (58.1 kg)   SpO2 98%   BMI 23.79 kg/m , BMI Body mass index is  23.79 kg/m.  Wt Readings from Last 3 Encounters:  11/26/19 128 lb (58.1 kg)  10/23/19 118 lb 6.4 oz (53.7 kg)  09/07/19 119 lb (54 kg)    General: Elderly woman, appears comfortable at rest. HEENT: Conjunctiva and lids normal, wearing a mask. Neck: Supple, no elevated JVP or carotid bruits, no thyromegaly. Lungs: Clear to auscultation, nonlabored breathing at rest. Cardiac: Regular rate and rhythm, no S3 or significant systolic murmur, no pericardial rub. Abdomen: Soft, nontender, bowel sounds present. Extremities: No pitting edema, distal pulses 2+. Skin: Warm and dry. Musculoskeletal: No kyphosis. Neuropsychiatric: Alert and oriented x3, affect grossly appropriate.  ECG:  An ECG dated 10/23/2019 was personally reviewed today and demonstrated:  Sinus rhythm with PVC, diffuse ST-T wave abnormalities.  Recent Labwork:  No interval lab work for review.  Other Studies Reviewed  Today:  Cardiac catheterization 03/17/2014: Hemodynamics: AO: 209/90 mmHg LV: 206/22 mmHg LVEDP: 44 mmHg  Coronary angiography: Coronary dominance:Right   Left Main: The left coronary system is heavily calcified. The left main is short with minor irregularities.  Left Anterior Descending (LAD): Normal in size. There is 30-40% proximal tubular stenosis followed by 40% disease in the midsegment. Minor irregularities are noted distally.  1st diagonal (D1): Normal in size with 20% ostial stenosis.  2nd diagonal (D2): Medium in size with 50% ostial and proximal stenosis.  3rd diagonal (D3): Very small in size.  Circumflex (LCx): Normal in size and nondominant. The vessel has mild diffuse atherosclerosis without obstructive disease.  1st obtuse marginal: Very small in size.  2nd obtuse marginal: Very small in size.  3rd obtuse marginal: Normal in size with no significant disease.   Right Coronary Artery: Severely calcified and tortuous. There is 20-30% proximal stenosis  and 40% mid stenosis. The rest of the vessel has minor irregularities.  Posterior descending artery: Normal in size with minor irregularities.  Posterior AV segment: Normal in size without significant disease.  Posterolateral branchs: PL 1 is very small in size. PL 2 is normal in size with minor irregularities.  Left ventriculography: Was not performed.  PV procedure note:: Abdominal aortogram and nonselective renal angiography was performed with a pigtail catheter in the AP position. This showed 90% ostial stenosis in the right renal artery. It appears that the previously placed stent was 1-2 mm short of the ostium. A stent graft is noted in the aorta. It appears to be intact. The right iliac limb is known to be occluded. The patient was given 3500 of unfractionated heparin followed by another 1500 units of heparin to achieve an ACT slightly above 200. I used an IM 6 [place] guide to engage the right renal artery. Selective angiography was performed. The lesion was crossed with a [place] core wire. I then used a 5 x 15 mm balloon to predilate the lesion. I decided to place another stent in order to cover the ostium and used a 6 x 18 mm Herculink stent which was deployed to 10 atmosphere. The balloon was pulled back and inflated again at the ostium to 12 atmospheres. Final angiography showed excellent results. The catheter and wire were removed. The site was closed with a Mynx device.  Final Conclusions:  1.Mild to moderate nonobstructive coronary artery disease with heavily calcified vessels. 2. Severely elevated blood pressure of and left ventricular end-diastolic pressure. 3. Severe ostial right renal artery stenosis. 4. Successful angioplasty and stent placement to the right renal artery.  Renal artery Doppler 06/03/2019: Summary: Largest Aortic Diameter: 2.2 cm  Renal:  Right: Normal size right kidney. Cyst(s) noted. RRV flow present. No evidence of right renal artery  stenosis. Normal cortical thickness of right kidney. Patent right renal artery stent. Left: Normal size of left kidney. Normal cortical thickness of the left kidney. No evidence of left renal artery stenosis. Mesenteric: Normal Celiac artery and Superior Mesenteric artery findings. Patent IVC.   Lexiscan Myoview 10/21/2019:  Downsloping ST segment depression ST segment depression of 1 mm was noted during stress in the V4 and V5 leads, and returning to baseline after 5-9 minutes of recovery.  Defect 1: There is a medium defect of moderate severity present in the mid inferoseptal, mid inferior, mid inferolateral and apical inferior location. There is also a considerable amount of GI uptake with soft tissue attenuation likely playing a role.  Findings consistent with possible prior  myocardial infarction with a very mild degree of peri-infarct ischemia.  This is a low risk study.  Nuclear stress EF: 55%.  Assessment and Plan:  1.  CAD with progressive exertional angina (also potential GI symptom overlay).  We have modified medical therapy and will further advance Imdur.  I did talk with her about proceeding to a diagnostic cardiac catheterization for definitive assessment.  She is hesitant to schedule this now, but states that she will think about this and speak with her daughter.  Ideally, would get this scheduled as an early morning case and allow her to be discharged later in the day.  She will call us back and let us know her decision.  2.  Essential hypertension.  Continue to modify medical therapy.  Current regimen includes Norvasc, clonidine, Imdur, and labetalol.  3.  Mixed hyperlipidemia, she continues on Crestor, no obvious intolerances.  4.  Renal artery stenosis status post stent intervention to the right renal artery in 2013 and 2015.  Dopplers in July 2020 showed no evidence of restenosis.  Medication Adjustments/Labs and Tests Ordered: Current medicines are  reviewed at length with the patient today.  Concerns regarding medicines are outlined above.   Tests Ordered: No orders of the defined types were placed in this encounter.   Medication Changes: Meds ordered this encounter  Medications  . isosorbide mononitrate (IMDUR) 30 MG 24 hr tablet    Sig: Take 1 tablet (30 mg total) by mouth at bedtime.    Dispense:  90 tablet    Refill:  2    11/26/2019 dose increase    Disposition:  Follow up 4 weeks in the [place] office.  Signed, [name], MD, [facility] 11/26/2019 11:56 AM    [facility] [place], [place],  [address] Phone: [phone]; Fax: [phone]

## 2019-12-09 DIAGNOSIS — I10 Essential (primary) hypertension: Secondary | ICD-10-CM | POA: Diagnosis not present

## 2019-12-23 ENCOUNTER — Encounter: Payer: Self-pay | Admitting: Cardiology

## 2019-12-23 ENCOUNTER — Other Ambulatory Visit: Payer: Self-pay | Admitting: Cardiology

## 2019-12-23 ENCOUNTER — Telehealth: Payer: Self-pay | Admitting: Cardiology

## 2019-12-23 ENCOUNTER — Telehealth (INDEPENDENT_AMBULATORY_CARE_PROVIDER_SITE_OTHER): Payer: Medicare Other | Admitting: Cardiology

## 2019-12-23 VITALS — BP 153/96 | HR 91 | Ht 61.5 in | Wt 123.0 lb

## 2019-12-23 DIAGNOSIS — I200 Unstable angina: Secondary | ICD-10-CM | POA: Diagnosis not present

## 2019-12-23 DIAGNOSIS — I25119 Atherosclerotic heart disease of native coronary artery with unspecified angina pectoris: Secondary | ICD-10-CM | POA: Diagnosis not present

## 2019-12-23 DIAGNOSIS — E782 Mixed hyperlipidemia: Secondary | ICD-10-CM

## 2019-12-23 DIAGNOSIS — I701 Atherosclerosis of renal artery: Secondary | ICD-10-CM | POA: Diagnosis not present

## 2019-12-23 DIAGNOSIS — Z01810 Encounter for preprocedural cardiovascular examination: Secondary | ICD-10-CM

## 2019-12-23 MED ORDER — SODIUM CHLORIDE 0.9% FLUSH: 3.0000 mL | Freq: Two times a day (BID) | INTRAVENOUS | Status: DC

## 2019-12-23 NOTE — Telephone Encounter (Signed)
Pre-cert Verification for the following procedure    )Left heart cath Thursday, December 31, 2019 @9 : 00 am wth Dr. Fletcher Anon dx: accelerating angina

## 2019-12-23 NOTE — Patient Instructions (Addendum)
Medication Instructions:   Your physician recommends that you continue on your current medications as directed. Please refer to the Current Medication list given to you today.  Labwork:  Your physician recommends that you return for non-fasting lab work on Monday, December 28, 2019 at [facility] in [place] to check your BMET & CBC.  Covid testing will be done after lab work. Your appointment is 1:00 pm. [place], [place] will need to go home and quarantine after your covid test until after your heart cath.   Testing/Procedures: Your physician has requested that you have a cardiac catheterization. Cardiac catheterization is used to diagnose and/or treat various heart conditions. Doctors may recommend this procedure for a number of different reasons. The most common reason is to evaluate chest pain. Chest pain can be a symptom of coronary artery disease (CAD), and cardiac catheterization can show whether plaque is narrowing or blocking your heart's arteries. This procedure is also used to evaluate the valves, as well as measure the blood flow and oxygen levels in different parts of your heart. For further information please visit [email or web address]. Please follow instruction sheet, as given.  Follow-Up:  Your physician recommends that you schedule a follow-up appointment in: 1 month (office)  Any Other Special Instructions Will Be Listed Below (If Applicable).  If you need a refill on your cardiac medications before your next appointment, please call your pharmacy.     [place] [address] Dept: [phone] Loc: [place]  12/23/2019  You are scheduled for a Cardiac Catheterization on Thursday, February 18 with Dr. Kathlyn Sacramento.  1. Please arrive at the [facility] (Main Entrance A)  at [facility]: [address] [place], [place] [address] at 7:00 AM (This time is two hours before your procedure to ensure your preparation).   Special note: Every effort is made to have your procedure done on time. Please understand that emergencies sometimes delay scheduled procedures.  2. Diet: Do not eat solid foods after midnight.  The patient may have clear liquids until 5am upon the day of the procedure.  3. Labs: You will need to have blood drawn on Monday, December 31, 2019 at [place] [address], [place]  Open: 7am - 4:30 pm  Phone: [phone]. You do not need to be fasting. Please have your covid test done at the [place] at 1:00 pm  4. Medication instructions in preparation for your procedure:   Contrast Allergy: No  On the morning of your procedure, take your Aspirin 81 mg and any morning medicines NOT listed above.  You may use sips of water.  5. Plan for one night stay--bring personal belongings. 6. Bring a current list of your medications and current insurance cards. 7. You MUST have a responsible person to drive you home. 8. Someone MUST be with you the first 24 hours after you arrive home or your discharge will be delayed. 9. Please wear clothes that are easy to get on and off and wear slip-on shoes.  Thank you for allowing [place] to care for you!   -- [facility]

## 2019-12-23 NOTE — Progress Notes (Signed)
Virtual Visit via Telephone Note   This visit type was conducted due to national recommendations for restrictions regarding the COVID-19 Pandemic (e.g. social distancing) in an effort to limit this patient's exposure and mitigate transmission in our community.  Due to her co-morbid illnesses, this patient is at least at moderate risk for complications without adequate follow up.  This format is felt to be most appropriate for this patient at this time.  The patient did not have access to video technology/had technical difficulties with video requiring transitioning to audio format only (telephone).  All issues noted in this document were discussed and addressed.  No physical exam could be performed with this format.  Please refer to the patient's chart for her  consent to telehealth for [facility].   Date:  12/23/2019   ID:  [name], DOB November 04, 1936, MRN [id number]  Patient Location: Home Provider Location: Office  PCP:  Glenda Chroman, MD  Cardiologist:  Rozann Lesches, MD Electrophysiologist:  None   Evaluation Performed:  Follow-Up Visit  Chief Complaint:  Cardiac follow-up  History of Present Illness:    [name] is an 84 y.o. female seen recently in mid January.  We spoke by phone today to review symptoms.  She continues to report recurring exertional chest discomfort and fatigue concerning for angina.  This is despite adjustments in her medications.  Her current cardiac regimen includes aspirin, Norvasc, Imdur, labetalol, clonidine, and Crestor.  She reports symptoms with house chores and walking to her mailbox.  I recommended considering a follow-up diagnostic cardiac catheterization and she states that she has discussed this with her daughter, they are both in agreement.  Previous cardiac work-up found nonobstructive disease as of 2015.  Follow-up Myoview in December 2020 was low risk but did suggest possible infarct scar in the inferolateral distribution with  peri-infarct ischemia.  We have discussed the risks and benefits of the diagnostic cardiac catheterization and she would like to proceed with testing at this point.  The patient does not have symptoms concerning for COVID-19 infection (fever, chills, cough, or new shortness of breath).    Past Medical History:  Diagnosis Date  . Anemia   . Anxiety   . Arthritis   . Chronic constipation   . Claudication [facility])    Chronic occlusion of the right limb of aortic stent graft.  . Diverticulosis    Left sided noted on colonoscopy 02/2006  . Ectopic atrial tachycardia ([place])   . Esophageal reflux disease   . Essential hypertension   . Hiatal hernia   . Hyperlipidemia   . Lumbar radiculopathy   . Macular degeneration   . Migraines   . Non-obstructive CAD    a. Reportedly nonobstructive at catheterization in the 1990s;  b. 03/2014 Cath: LM nl, LAD 30-40p, 70m, D1 20ost, D2 50ost/p, D3 small, LCX nl/nondominant, OM 1/2/3 nl, RCA 20-30p, 31m, PDA nl, RPL1/2  small, nl.  . Penetrating atherosclerotic ulcer of aorta (HCC)    Complex ulcerated plaque of the infrarenal abdominal aorta mild renal artery stenosis on the right side, normal left renal artery catheterization 2009, status post aortic stent graft  . Renal artery stenosis ([place])    a. Right renal artery stent April 2013 - Dr. Fletcher Anon;  b. 03/2014 PTA/stenting of RRA 2/2 ISR: 6x18 Herculink stent.  . Trigeminal neuralgia    Right  . Type 2 diabetes mellitus ([place])    Past Surgical History:  Procedure Laterality Date  . ABDOMINAL AORTAGRAM  N/A 02/13/2012   Procedure: ABDOMINAL Maxcine Ham;  Surgeon: Wellington Hampshire, MD;  Location: [facility] CATH LAB;  Service: Cardiovascular;  Laterality: N/A;  . ABDOMINAL AORTAGRAM N/A 03/17/2014   Procedure: ABDOMINAL Maxcine Ham;  Surgeon: Wellington Hampshire, MD;  Location: [place] CATH LAB;  Service: Cardiovascular;  Laterality: N/A;  . ABDOMINAL HYSTERECTOMY  1982  . BREAST BIOPSY Left 1990's  . CAROTID ENDARTERECTOMY Left  1993  . Endologix powerlink bifurcated     Covered stent graft  . LEFT HEART CATHETERIZATION WITH CORONARY ANGIOGRAM N/A 03/17/2014   Procedure: LEFT HEART CATHETERIZATION WITH CORONARY ANGIOGRAM;  Surgeon: Wellington Hampshire, MD;  Location: [place] CATH LAB;  Service: Cardiovascular;  Laterality: N/A;  . LUMBAR [place]  . PERCUTANEOUS STENT INTERVENTION Right 03/17/2014   Procedure: PERCUTANEOUS STENT INTERVENTION;  Surgeon: Wellington Hampshire, MD;  Location: [place] CATH LAB;  Service: Cardiovascular;  Laterality: Right;  Renal  . RENAL ANGIOGRAM N/A 02/13/2012   Procedure: RENAL ANGIOGRAM;  Surgeon: Wellington Hampshire, MD;  Location: [place] CATH LAB;  Service: Cardiovascular;  Laterality: N/A;  . RENAL ANGIOGRAM N/A 03/17/2014   Procedure: RENAL ANGIOGRAM;  Surgeon: Wellington Hampshire, MD;  Location: [place] CATH LAB;  Service: Cardiovascular;  Laterality: N/A;  . TONSILLECTOMY AND ADENOIDECTOMY  1947     Current Meds  Medication Sig  . acetaminophen (TYLENOL) 325 MG tablet Take 325 mg by mouth every 6 (six) hours as needed. For pain.  Marland Kitchen amLODipine (NORVASC) 2.5 MG tablet Take 2.5 mg by mouth 2 (two) times daily.  Marland Kitchen aspirin EC 81 MG tablet Take 81 mg by mouth at bedtime.   . Cholecalciferol (VITAMIN D3) 1000 UNITS CAPS Take 1,000 capsules by mouth daily.  . clonazePAM (KLONOPIN) 0.5 MG tablet Take 0.125 mg by mouth at bedtime as needed for anxiety.   . cloNIDine (CATAPRES) 0.1 MG tablet Take 0.1 mg by mouth 2 (two) times daily as needed (SBP>170). Reported on 02/15/2016  . isosorbide mononitrate (IMDUR) 30 MG 24 hr tablet Take 1 tablet (30 mg total) by mouth at bedtime.  Marland Kitchen labetalol (NORMODYNE) 200 MG tablet Take 100 mg by mouth 2 (two) times daily.  . Multiple Vitamins-Minerals (PRESERVISION/LUTEIN) CAPS Take 1 capsule by mouth 2 (two) times daily.   . pantoprazole (PROTONIX) 40 MG tablet Take 40 mg by mouth daily.  . polyethylene glycol powder (GLYCOLAX/MIRALAX) 17 GM/SCOOP powder Take 17 g by mouth 2 (two)  times a week. As needed  . rosuvastatin (CRESTOR) 5 MG tablet TAKE 1 TABLET DAILY  . [DISCONTINUED] amLODipine (NORVASC) 5 MG tablet Take 1 tablet (5 mg total) by mouth daily.  . [DISCONTINUED] labetalol (NORMODYNE) 100 MG tablet Take 100 mg by mouth 2 (two) times daily.  . [DISCONTINUED] polyethylene glycol (MIRALAX / GLYCOLAX) packet Take 17 g by mouth daily as needed for mild constipation. Constipation     Allergies:   Cefixime, Conjugated estrogens, Morphine and related, Codeine, Iohexol, and Adenosine   Social History   Tobacco Use  . Smoking status: Former Smoker    Packs/day: 0.20    Years: 1.00    Pack years: 0.20    Types: Cigarettes    Quit date: 11/13/1979    Years since quitting: 40.1  . Smokeless tobacco: Never Used  Substance Use Topics  . Alcohol use: No    Alcohol/week: 0.0 standard drinks  . Drug use: No     Family Hx: The patient's family history includes Coronary artery disease in her mother; Heart attack  in her father; Heart disease in her brother, father, and mother; Heart failure in her mother; Hypertension in her brother, father, mother, sister, and sister; Kidney disease in her sister; Parkinson's disease in her sister; Stroke in her mother, sister, and sister. There is no history of Breast cancer.  ROS:   Please see the history of present illness. All other systems reviewed and are negative.   Prior CV studies:   The following studies were reviewed today:  Cardiac catheterization 03/17/2014: Hemodynamics: AO: 209/90 mmHg LV: 206/22 mmHg LVEDP: 44 mmHg  Coronary angiography: Coronary dominance:Right   Left Main: The left coronary system is heavily calcified. The left main is short with minor irregularities.  Left Anterior Descending (LAD): Normal in size. There is 30-40% proximal tubular stenosis followed by 40% disease in the midsegment. Minor irregularities are noted distally.  1st diagonal (D1): Normal in size with 20% ostial  stenosis.  2nd diagonal (D2): Medium in size with 50% ostial and proximal stenosis.  3rd diagonal (D3): Very small in size.  Circumflex (LCx): Normal in size and nondominant. The vessel has mild diffuse atherosclerosis without obstructive disease.  1st obtuse marginal: Very small in size.  2nd obtuse marginal: Very small in size.  3rd obtuse marginal: Normal in size with no significant disease.   Right Coronary Artery: Severely calcified and tortuous. There is 20-30% proximal stenosis and 40% mid stenosis. The rest of the vessel has minor irregularities.  Posterior descending artery: Normal in size with minor irregularities.  Posterior AV segment: Normal in size without significant disease.  Posterolateral branchs: PL 1 is very small in size. PL 2 is normal in size with minor irregularities.  Left ventriculography: Was not performed.  PV procedure note:: Abdominal aortogram and nonselective renal angiography was performed with a pigtail catheter in the AP position. This showed 90% ostial stenosis in the right renal artery. It appears that the previously placed stent was 1-2 mm short of the ostium. A stent graft is noted in the aorta. It appears to be intact. The right iliac limb is known to be occluded. The patient was given 3500 of unfractionated heparin followed by another 1500 units of heparin to achieve an ACT slightly above 200. I used an IM 6 [place] guide to engage the right renal artery. Selective angiography was performed. The lesion was crossed with a [place] core wire. I then used a 5 x 15 mm balloon to predilate the lesion. I decided to place another stent in order to cover the ostium and used a 6 x 18 mm Herculink stent which was deployed to 10 atmosphere. The balloon was pulled back and inflated again at the ostium to 12 atmospheres. Final angiography showed excellent results. The catheter and wire were removed. The site was closed with a Mynx  device.  Final Conclusions:  1.Mild to moderate nonobstructive coronary artery disease with heavily calcified vessels. 2. Severely elevated blood pressure of and left ventricular end-diastolic pressure. 3. Severe ostial right renal artery stenosis. 4. Successful angioplasty and stent placement to the right renal artery.  Renal arteryDoppler7/22/2020: Summary: Largest Aortic Diameter: 2.2 cm  Renal:  Right: Normal size right kidney. Cyst(s) noted. RRV flow present. No evidence of right renal artery stenosis. Normal cortical thickness of right kidney. Patent right renal artery stent. Left: Normal size of left kidney. Normal cortical thickness of the left kidney. No evidence of left renal artery stenosis. Mesenteric: Normal Celiac artery and Superior Mesenteric artery findings. Patent IVC.   Lexiscan Myoview 10/21/2019:  Downsloping ST segment depression ST segment depression of 1 mm was noted during stress in the V4 and V5 leads, and returning to baseline after 5-9 minutes of recovery.  Defect 1: There is a medium defect of moderate severity present in the mid inferoseptal, mid inferior, mid inferolateral and apical inferior location. There is also a considerable amount of GI uptake with soft tissue attenuation likely playing a role.  Findings consistent with possible prior myocardial infarction with a very mild degree of peri-infarct ischemia.  This is a low risk study.  Nuclear stress EF: 55%.  Labs/Other Tests and Data Reviewed:    EKG:  An ECG dated 10/23/2019 was personally reviewed today and demonstrated:  Sinus rhythm with PVC, diffuse ST-T wave abnormalities.  Recent Labs:  No interval lab work for review today.  Wt Readings from Last 3 Encounters:  12/23/19 123 lb (55.8 kg)  11/26/19 128 lb (58.1 kg)  10/23/19 118 lb 6.4 oz (53.7 kg)     Objective:    Vital Signs:  BP (!) 153/96   Pulse 91   Ht 5' 1.5" (1.562 m)   Wt 123 lb  (55.8 kg)   BMI 22.86 kg/m    Patient spoke in full sentences, not short of breath. No audible wheezing or coughing. Speech pattern normal.  ASSESSMENT & PLAN:    1.  Accelerating angina based on symptom description.  This is despite medical therapy adjustment as outlined above.  After discussion of risks and benefits, she is in agreement to proceed with a diagnostic cardiac catheterization.  2.  History of nonobstructive CAD by cardiac catheterization in 2015.  Myoview in December 2020 showed possible infarct scar in the inferolateral distribution with peri-infarct ischemia.  She is now on aspirin, Norvasc, clonidine, Imdur, labetalol, and Crestor.  3.  Renal artery stenosis status post stent intervention to the right renal artery in 2013 as well as 2015.  Follow-up Dopplers from July 2020 showed no evidence of restenosis.  4.  Mixed hyperlipidemia, on Crestor.   Time:   Today, I have spent 6 minutes with the patient with telehealth technology discussing the above problems.     Medication Adjustments/Labs and Tests Ordered: Current medicines are reviewed at length with the patient today.  Concerns regarding medicines are outlined above.   Tests Ordered: Orders Placed This Encounter  Procedures  . Basic metabolic panel  . CBC    Medication Changes: No orders of the defined types were placed in this encounter.   Follow Up:  After testing.   Signed, Rozann Lesches, MD  12/23/2019 4:26 PM    [place]

## 2019-12-24 DIAGNOSIS — Z23 Encounter for immunization: Secondary | ICD-10-CM | POA: Diagnosis not present

## 2019-12-28 ENCOUNTER — Other Ambulatory Visit (HOSPITAL_COMMUNITY): Payer: Medicare Other

## 2019-12-28 ENCOUNTER — Other Ambulatory Visit: Payer: Self-pay

## 2019-12-28 ENCOUNTER — Other Ambulatory Visit (HOSPITAL_COMMUNITY)
Admission: RE | Admit: 2019-12-28 | Discharge: 2019-12-28 | Disposition: A | Discharge status: Home / Self Care | Payer: Medicare Other | Source: Ambulatory Visit | Attending: Cardiology | Admitting: Cardiology

## 2019-12-29 ENCOUNTER — Telehealth: Payer: Self-pay | Admitting: *Deleted

## 2019-12-29 NOTE — Telephone Encounter (Signed)
-----   Message from Massie Maroon, [place] sent at 12/28/2019  8:33 AM EST ----- This pt wanted to cx cath - said she will call back when weather is better to [facility]

## 2019-12-31 ENCOUNTER — Ambulatory Visit (HOSPITAL_COMMUNITY): Admission: RE | Admit: 2019-12-31 | Payer: Medicare Other | Source: Home / Self Care | Admitting: Cardiovascular Disease

## 2019-12-31 ENCOUNTER — Encounter (HOSPITAL_COMMUNITY): Admission: RE | Payer: Self-pay | Source: Home / Self Care

## 2019-12-31 SURGERY — LEFT HEART CATH AND CORONARY ANGIOGRAPHY
Anesthesia: LOCAL

## 2020-01-10 DIAGNOSIS — I10 Essential (primary) hypertension: Secondary | ICD-10-CM | POA: Diagnosis not present

## 2020-01-22 DIAGNOSIS — Z23 Encounter for immunization: Secondary | ICD-10-CM | POA: Diagnosis not present

## 2020-01-25 ENCOUNTER — Encounter: Payer: Self-pay | Admitting: Cardiology

## 2020-01-25 ENCOUNTER — Telehealth (INDEPENDENT_AMBULATORY_CARE_PROVIDER_SITE_OTHER): Payer: Medicare Other | Admitting: Cardiology

## 2020-01-25 ENCOUNTER — Telehealth: Payer: Self-pay | Admitting: *Deleted

## 2020-01-25 ENCOUNTER — Telehealth: Payer: Self-pay | Admitting: Cardiology

## 2020-01-25 ENCOUNTER — Other Ambulatory Visit: Payer: Self-pay | Admitting: Cardiology

## 2020-01-25 VITALS — BP 144/73 | HR 83 | Ht 61.5 in | Wt 123.0 lb

## 2020-01-25 DIAGNOSIS — I10 Essential (primary) hypertension: Secondary | ICD-10-CM

## 2020-01-25 DIAGNOSIS — I200 Unstable angina: Secondary | ICD-10-CM

## 2020-01-25 DIAGNOSIS — I701 Atherosclerosis of renal artery: Secondary | ICD-10-CM

## 2020-01-25 DIAGNOSIS — I25119 Atherosclerotic heart disease of native coronary artery with unspecified angina pectoris: Secondary | ICD-10-CM | POA: Diagnosis not present

## 2020-01-25 DIAGNOSIS — E782 Mixed hyperlipidemia: Secondary | ICD-10-CM

## 2020-01-25 MED ORDER — SODIUM CHLORIDE 0.9% FLUSH: 3.0000 mL | Freq: Two times a day (BID) | INTRAVENOUS | Status: DC

## 2020-01-25 NOTE — Telephone Encounter (Signed)
Patient request later time for heart cath. Says she can not get there at 5:30 am. Appointment time changed to 9:00 am with arrival at 7:00 am. Patient aware.

## 2020-01-25 NOTE — H&P (View-Only) (Signed)
Virtual Visit via Telephone Note   This visit type was conducted due to national recommendations for restrictions regarding the COVID-19 Pandemic (e.g. social distancing) in an effort to limit this patient's exposure and mitigate transmission in our community.  Due to her co-morbid illnesses, this patient is at least at moderate risk for complications without adequate follow up.  This format is felt to be most appropriate for this patient at this time.  The patient did not have access to video technology/had technical difficulties with video requiring transitioning to audio format only (telephone).  All issues noted in this document were discussed and addressed.  No physical exam could be performed with this format.  Please refer to the patient's chart for her  consent to telehealth for [facility].   The patient was identified using 2 identifiers.  Date:  01/25/2020   ID:  [name], DOB [birth year], MRN [id number]  Patient Location: Home Provider Location: Office  PCP:  Glenda Chroman, MD  Cardiologist:  Rozann Lesches, MD Electrophysiologist:  None   Evaluation Performed:  Follow-Up Visit  Chief Complaint:  Cardiac follow-up  History of Present Illness:    [name] is an 84 y.o. female last assessed via telehealth encounter in February.  She was referred at that time for a diagnostic cardiac catheterization in light of progressive angina symptoms despite medical therapy adjustments.  She ultimately canceled the procedure due to the weather.  She still describes recurring exertional chest pain with medical therapy and would like to reschedule the procedure.  I reviewed her medications which are outlined below and stable, no significant changes in present cardiac regimen.  The patient does not have symptoms concerning for COVID-19 infection (fever, chills, cough, or new shortness of breath).    Past Medical History:  Diagnosis Date  . Anemia   . Anxiety   . Arthritis    . Chronic constipation   . Claudication [facility])    Chronic occlusion of the right limb of aortic stent graft.  . Diverticulosis    Left sided noted on colonoscopy 02/2006  . Ectopic atrial tachycardia ([place])   . Esophageal reflux disease   . Essential hypertension   . Hiatal hernia   . Hyperlipidemia   . Lumbar radiculopathy   . Macular degeneration   . Migraines   . Non-obstructive CAD    a. Reportedly nonobstructive at catheterization in the 1990s;  b. 03/2014 Cath: LM nl, LAD 30-40p, 46m, D1 20ost, D2 50ost/p, D3 small, LCX nl/nondominant, OM 1/2/3 nl, RCA 20-30p, 85m, PDA nl, RPL1/2  small, nl.  . Penetrating atherosclerotic ulcer of aorta (HCC)    Complex ulcerated plaque of the infrarenal abdominal aorta mild renal artery stenosis on the right side, normal left renal artery catheterization 2009, status post aortic stent graft  . Renal artery stenosis ([place])    a. Right renal artery stent April 2013 - Dr. Fletcher Anon;  b. 03/2014 PTA/stenting of RRA 2/2 ISR: 6x18 Herculink stent.  . Trigeminal neuralgia    Right  . Type 2 diabetes mellitus ([place])    Past Surgical History:  Procedure Laterality Date  . ABDOMINAL AORTAGRAM N/A 02/13/2012   Procedure: ABDOMINAL Maxcine Ham;  Surgeon: Wellington Hampshire, MD;  Location: [place] CATH LAB;  Service: Cardiovascular;  Laterality: N/A;  . ABDOMINAL AORTAGRAM N/A 03/17/2014   Procedure: ABDOMINAL Maxcine Ham;  Surgeon: Wellington Hampshire, MD;  Location: [place] CATH LAB;  Service: Cardiovascular;  Laterality: N/A;  . ABDOMINAL HYSTERECTOMY  1982  .  BREAST BIOPSY Left 1990's  . CAROTID ENDARTERECTOMY Left 1993  . Endologix powerlink bifurcated     Covered stent graft  . LEFT HEART CATHETERIZATION WITH CORONARY ANGIOGRAM N/A 03/17/2014   Procedure: LEFT HEART CATHETERIZATION WITH CORONARY ANGIOGRAM;  Surgeon: Wellington Hampshire, MD;  Location: [place] CATH LAB;  Service: Cardiovascular;  Laterality: N/A;  . LUMBAR [place]  . PERCUTANEOUS STENT INTERVENTION Right  03/17/2014   Procedure: PERCUTANEOUS STENT INTERVENTION;  Surgeon: Wellington Hampshire, MD;  Location: [place] CATH LAB;  Service: Cardiovascular;  Laterality: Right;  Renal  . RENAL ANGIOGRAM N/A 02/13/2012   Procedure: RENAL ANGIOGRAM;  Surgeon: Wellington Hampshire, MD;  Location: [place] CATH LAB;  Service: Cardiovascular;  Laterality: N/A;  . RENAL ANGIOGRAM N/A 03/17/2014   Procedure: RENAL ANGIOGRAM;  Surgeon: Wellington Hampshire, MD;  Location: [place] CATH LAB;  Service: Cardiovascular;  Laterality: N/A;  . TONSILLECTOMY AND ADENOIDECTOMY  1947     Current Meds  Medication Sig  . acetaminophen (TYLENOL) 325 MG tablet Take 325 mg by mouth every 6 (six) hours as needed. For pain.  Marland Kitchen amLODipine (NORVASC) 2.5 MG tablet Take 2.5 mg by mouth 2 (two) times daily.  Marland Kitchen aspirin EC 81 MG tablet Take 81 mg by mouth at bedtime.   . Cholecalciferol (VITAMIN D3) 1000 UNITS CAPS Take 1,000 Units by mouth daily.   . clonazePAM (KLONOPIN) 0.5 MG tablet Take 0.25 mg by mouth at bedtime as needed for anxiety.   . cloNIDine (CATAPRES) 0.1 MG tablet Take 0.1 mg by mouth 2 (two) times daily as needed (SBP>170). Reported on 02/15/2016  . isosorbide mononitrate (IMDUR) 30 MG 24 hr tablet Take 1 tablet (30 mg total) by mouth at bedtime.  Marland Kitchen labetalol (NORMODYNE) 200 MG tablet Take 100 mg by mouth 2 (two) times daily.  . Multiple Vitamins-Minerals (PRESERVISION/LUTEIN) CAPS Take 1 capsule by mouth 2 (two) times daily.   . pantoprazole (PROTONIX) 40 MG tablet Take 40 mg by mouth daily.  . polyethylene glycol powder (GLYCOLAX/MIRALAX) 17 GM/SCOOP powder Take 17 g by mouth 2 (two) times a week. As needed  . rosuvastatin (CRESTOR) 5 MG tablet TAKE 1 TABLET DAILY   Current Facility-Administered Medications for the 01/25/20 encounter (Telemedicine) with [name], MD  Medication  . sodium chloride flush (NS) 0.9 % injection 3 mL     Allergies:   Cefixime, Conjugated estrogens, Morphine and related, Codeine, Iohexol, and Adenosine    Social History   Tobacco Use  . Smoking status: Former Smoker    Packs/day: 0.20    Years: 1.00    Pack years: 0.20    Types: Cigarettes    Quit date: 11/13/1979    Years since quitting: 40.2  . Smokeless tobacco: Never Used  Substance Use Topics  . Alcohol use: No    Alcohol/week: 0.0 standard drinks  . Drug use: No     Family Hx: The patient's family history includes Coronary artery disease in her mother; Heart attack in her father; Heart disease in her brother, father, and mother; Heart failure in her mother; Hypertension in her brother, father, mother, sister, and sister; Kidney disease in her sister; Parkinson's disease in her sister; Stroke in her mother, sister, and sister. There is no history of Breast cancer.  ROS:   Intermittent headaches.  Fatigue.  Prior CV studies:   The following studies were reviewed today:  Renal arteryDoppler7/22/2020: Summary: Largest Aortic Diameter: 2.2 cm  Renal:  Right: Normal size right kidney.  Cyst(s) noted. RRV flow present. No evidence of right renal artery stenosis. Normal cortical thickness of right kidney. Patent right renal artery stent. Left: Normal size of left kidney. Normal cortical thickness of the left kidney. No evidence of left renal artery stenosis. Mesenteric: Normal Celiac artery and Superior Mesenteric artery findings. Patent IVC.  Lexiscan Myoview 10/21/2019:  Downsloping ST segment depression ST segment depression of 1 mm was noted during stress in the V4 and V5 leads, and returning to baseline after 5-9 minutes of recovery.  Defect 1: There is a medium defect of moderate severity present in the mid inferoseptal, mid inferior, mid inferolateral and apical inferior location. There is also a considerable amount of GI uptake with soft tissue attenuation likely playing a role.  Findings consistent with possible prior myocardial infarction with a very mild degree of peri-infarct ischemia.   This is a low risk study.  Nuclear stress EF: 55%.  Labs/Other Tests and Data Reviewed:    EKG:  An ECG dated 10/23/2019 was personally reviewed today and demonstrated:  Sinus rhythm with PVC, diffuse ST-T wave abnormalities.  Recent Labs:  No interval lab work for review today.  Wt Readings from Last 3 Encounters:  01/25/20 123 lb (55.8 kg)  12/23/19 123 lb (55.8 kg)  11/26/19 128 lb (58.1 kg)     Objective:    Vital Signs:  BP (!) 144/73   Pulse 83   Ht 5' 1.5" (1.562 m)   Wt 123 lb (55.8 kg)   BMI 22.86 kg/m    Patient spoke in complete sentences, not obviously short of breath at rest. No audible wheezing or coughing. Speech pattern normal.  ASSESSMENT & PLAN:    1.  Accelerating angina on medical therapy.  She would like to proceed with previous plan to evaluate with diagnostic cardiac catheterization (prior procedure in February canceled due to the weather).  Risks and benefits have been discussed and she is in agreement to proceed in the near future.  Plan to continue aspirin, Norvasc, labetalol, Imdur, and Crestor.  2.  History of nonobstructive CAD by prior cardiac catheterization in 2015.  Follow-up Myoview in December of last year showed possible infarct scar in the inferolateral distribution with peri-infarct ischemia.  We are pursuing follow-up evaluation of coronary anatomy as discussed above.  3.  Renal artery stenosis status post intervention to the right renal artery in 2013 as well as 2015.  She reports previous nosebleeds on Plavix.  Otherwise tolerating aspirin and statin therapy now.  Follow-up renal Dopplers in July of last year were reassuring.  4.  Mixed hyperlipidemia, remains on statin therapy.  She continues to follow with Dr. Woody Seller.   Time:   Today, I have spent 6 minutes with the patient with telehealth technology discussing the above problems.     Medication Adjustments/Labs and Tests Ordered: Current medicines are reviewed at length with  the patient today.  Concerns regarding medicines are outlined above.   Tests Ordered: No orders of the defined types were placed in this encounter.   Medication Changes: No orders of the defined types were placed in this encounter.   Follow Up:  Follow-up after procedure.   Signed, Rozann Lesches, MD  01/25/2020 11:11 AM    [place]

## 2020-01-25 NOTE — Progress Notes (Signed)
Virtual Visit via Telephone Note   This visit type was conducted due to national recommendations for restrictions regarding the COVID-19 Pandemic (e.g. social distancing) in an effort to limit this patient's exposure and mitigate transmission in our community.  Due to her co-morbid illnesses, this patient is at least at moderate risk for complications without adequate follow up.  This format is felt to be most appropriate for this patient at this time.  The patient did not have access to video technology/had technical difficulties with video requiring transitioning to audio format only (telephone).  All issues noted in this document were discussed and addressed.  No physical exam could be performed with this format.  Please refer to the patient's chart for her  consent to telehealth for [facility].   The patient was identified using 2 identifiers.  Date:  01/25/2020   ID:  [name], DOB [birth year], MRN [id number]  Patient Location: Home Provider Location: Office  PCP:  Glenda Chroman, MD  Cardiologist:  Rozann Lesches, MD Electrophysiologist:  None   Evaluation Performed:  Follow-Up Visit  Chief Complaint:  Cardiac follow-up  History of Present Illness:    [name] is an 84 y.o. female last assessed via telehealth encounter in February.  She was referred at that time for a diagnostic cardiac catheterization in light of progressive angina symptoms despite medical therapy adjustments.  She ultimately canceled the procedure due to the weather.  She still describes recurring exertional chest pain with medical therapy and would like to reschedule the procedure.  I reviewed her medications which are outlined below and stable, no significant changes in present cardiac regimen.  The patient does not have symptoms concerning for COVID-19 infection (fever, chills, cough, or new shortness of breath).    Past Medical History:  Diagnosis Date  . Anemia   . Anxiety   . Arthritis    . Chronic constipation   . Claudication [facility])    Chronic occlusion of the right limb of aortic stent graft.  . Diverticulosis    Left sided noted on colonoscopy 02/2006  . Ectopic atrial tachycardia ([place])   . Esophageal reflux disease   . Essential hypertension   . Hiatal hernia   . Hyperlipidemia   . Lumbar radiculopathy   . Macular degeneration   . Migraines   . Non-obstructive CAD    a. Reportedly nonobstructive at catheterization in the 1990s;  b. 03/2014 Cath: LM nl, LAD 30-40p, 30m, D1 20ost, D2 50ost/p, D3 small, LCX nl/nondominant, OM 1/2/3 nl, RCA 20-30p, 76m, PDA nl, RPL1/2  small, nl.  . Penetrating atherosclerotic ulcer of aorta (HCC)    Complex ulcerated plaque of the infrarenal abdominal aorta mild renal artery stenosis on the right side, normal left renal artery catheterization 2009, status post aortic stent graft  . Renal artery stenosis ([place])    a. Right renal artery stent April 2013 - Dr. Fletcher Anon;  b. 03/2014 PTA/stenting of RRA 2/2 ISR: 6x18 Herculink stent.  . Trigeminal neuralgia    Right  . Type 2 diabetes mellitus ([place])    Past Surgical History:  Procedure Laterality Date  . ABDOMINAL AORTAGRAM N/A 02/13/2012   Procedure: ABDOMINAL Maxcine Ham;  Surgeon: Wellington Hampshire, MD;  Location: [facility] CATH LAB;  Service: Cardiovascular;  Laterality: N/A;  . ABDOMINAL AORTAGRAM N/A 03/17/2014   Procedure: ABDOMINAL Maxcine Ham;  Surgeon: Wellington Hampshire, MD;  Location: [facility] CATH LAB;  Service: Cardiovascular;  Laterality: N/A;  . ABDOMINAL HYSTERECTOMY  1982  .  BREAST BIOPSY Left 1990's  . CAROTID ENDARTERECTOMY Left 1993  . Endologix powerlink bifurcated     Covered stent graft  . LEFT HEART CATHETERIZATION WITH CORONARY ANGIOGRAM N/A 03/17/2014   Procedure: LEFT HEART CATHETERIZATION WITH CORONARY ANGIOGRAM;  Surgeon: Wellington Hampshire, MD;  Location: [facility] CATH LAB;  Service: Cardiovascular;  Laterality: N/A;  . LUMBAR [place]  . PERCUTANEOUS STENT INTERVENTION Right  03/17/2014   Procedure: PERCUTANEOUS STENT INTERVENTION;  Surgeon: Wellington Hampshire, MD;  Location: [facility] CATH LAB;  Service: Cardiovascular;  Laterality: Right;  Renal  . RENAL ANGIOGRAM N/A 02/13/2012   Procedure: RENAL ANGIOGRAM;  Surgeon: Wellington Hampshire, MD;  Location: [facility] CATH LAB;  Service: Cardiovascular;  Laterality: N/A;  . RENAL ANGIOGRAM N/A 03/17/2014   Procedure: RENAL ANGIOGRAM;  Surgeon: Wellington Hampshire, MD;  Location: [facility] CATH LAB;  Service: Cardiovascular;  Laterality: N/A;  . TONSILLECTOMY AND ADENOIDECTOMY  1947     Current Meds  Medication Sig  . acetaminophen (TYLENOL) 325 MG tablet Take 325 mg by mouth every 6 (six) hours as needed. For pain.  Marland Kitchen amLODipine (NORVASC) 2.5 MG tablet Take 2.5 mg by mouth 2 (two) times daily.  Marland Kitchen aspirin EC 81 MG tablet Take 81 mg by mouth at bedtime.   . Cholecalciferol (VITAMIN D3) 1000 UNITS CAPS Take 1,000 Units by mouth daily.   . clonazePAM (KLONOPIN) 0.5 MG tablet Take 0.25 mg by mouth at bedtime as needed for anxiety.   . cloNIDine (CATAPRES) 0.1 MG tablet Take 0.1 mg by mouth 2 (two) times daily as needed (SBP>170). Reported on 02/15/2016  . isosorbide mononitrate (IMDUR) 30 MG 24 hr tablet Take 1 tablet (30 mg total) by mouth at bedtime.  Marland Kitchen labetalol (NORMODYNE) 200 MG tablet Take 100 mg by mouth 2 (two) times daily.  . Multiple Vitamins-Minerals (PRESERVISION/LUTEIN) CAPS Take 1 capsule by mouth 2 (two) times daily.   . pantoprazole (PROTONIX) 40 MG tablet Take 40 mg by mouth daily.  . polyethylene glycol powder (GLYCOLAX/MIRALAX) 17 GM/SCOOP powder Take 17 g by mouth 2 (two) times a week. As needed  . rosuvastatin (CRESTOR) 5 MG tablet TAKE 1 TABLET DAILY   Current Facility-Administered Medications for the 01/25/20 encounter (Telemedicine) with [name], MD  Medication  . sodium chloride flush (NS) 0.9 % injection 3 mL     Allergies:   Cefixime, Conjugated estrogens, Morphine and related, Codeine, Iohexol, and Adenosine    Social History   Tobacco Use  . Smoking status: Former Smoker    Packs/day: 0.20    Years: 1.00    Pack years: 0.20    Types: Cigarettes    Quit date: 11/13/1979    Years since quitting: 40.2  . Smokeless tobacco: Never Used  Substance Use Topics  . Alcohol use: No    Alcohol/week: 0.0 standard drinks  . Drug use: No     Family Hx: The patient's family history includes Coronary artery disease in her mother; Heart attack in her father; Heart disease in her brother, father, and mother; Heart failure in her mother; Hypertension in her brother, father, mother, sister, and sister; Kidney disease in her sister; Parkinson's disease in her sister; Stroke in her mother, sister, and sister. There is no history of Breast cancer.  ROS:   Intermittent headaches.  Fatigue.  Prior CV studies:   The following studies were reviewed today:  Renal arteryDoppler7/22/2020: Summary: Largest Aortic Diameter: 2.2 cm  Renal:  Right: Normal size right kidney.  Cyst(s) noted. RRV flow present. No evidence of right renal artery stenosis. Normal cortical thickness of right kidney. Patent right renal artery stent. Left: Normal size of left kidney. Normal cortical thickness of the left kidney. No evidence of left renal artery stenosis. Mesenteric: Normal Celiac artery and Superior Mesenteric artery findings. Patent IVC.  Lexiscan Myoview 10/21/2019:  Downsloping ST segment depression ST segment depression of 1 mm was noted during stress in the V4 and V5 leads, and returning to baseline after 5-9 minutes of recovery.  Defect 1: There is a medium defect of moderate severity present in the mid inferoseptal, mid inferior, mid inferolateral and apical inferior location. There is also a considerable amount of GI uptake with soft tissue attenuation likely playing a role.  Findings consistent with possible prior myocardial infarction with a very mild degree of peri-infarct  ischemia.  This is a low risk study.  Nuclear stress EF: 55%.  Labs/Other Tests and Data Reviewed:    EKG:  An ECG dated 10/23/2019 was personally reviewed today and demonstrated:  Sinus rhythm with PVC, diffuse ST-T wave abnormalities.  Recent Labs:  No interval lab work for review today.  Wt Readings from Last 3 Encounters:  01/25/20 123 lb (55.8 kg)  12/23/19 123 lb (55.8 kg)  11/26/19 128 lb (58.1 kg)     Objective:    Vital Signs:  BP (!) 144/73   Pulse 83   Ht 5' 1.5" (1.562 m)   Wt 123 lb (55.8 kg)   BMI 22.86 kg/m    Patient spoke in complete sentences, not obviously short of breath at rest. No audible wheezing or coughing. Speech pattern normal.  ASSESSMENT & PLAN:    1.  Accelerating angina on medical therapy.  She would like to proceed with previous plan to evaluate with diagnostic cardiac catheterization (prior procedure in February canceled due to the weather).  Risks and benefits have been discussed and she is in agreement to proceed in the near future.  Plan to continue aspirin, Norvasc, labetalol, Imdur, and Crestor.  2.  History of nonobstructive CAD by prior cardiac catheterization in 2015.  Follow-up Myoview in December of last year showed possible infarct scar in the inferolateral distribution with peri-infarct ischemia.  We are pursuing follow-up evaluation of coronary anatomy as discussed above.  3.  Renal artery stenosis status post intervention to the right renal artery in 2013 as well as 2015.  She reports previous nosebleeds on Plavix.  Otherwise tolerating aspirin and statin therapy now.  Follow-up renal Dopplers in July of last year were reassuring.  4.  Mixed hyperlipidemia, remains on statin therapy.  She continues to follow with Dr. Woody Seller.   Time:   Today, I have spent 6 minutes with the patient with telehealth technology discussing the above problems.     Medication Adjustments/Labs and Tests Ordered: Current medicines are reviewed at  length with the patient today.  Concerns regarding medicines are outlined above.   Tests Ordered: No orders of the defined types were placed in this encounter.   Medication Changes: No orders of the defined types were placed in this encounter.   Follow Up:  Follow-up after procedure.   Signed, Rozann Lesches, MD  01/25/2020 11:11 AM    [place]

## 2020-01-25 NOTE — Telephone Encounter (Signed)
  Precert needed for:   Left heart cath dx: accelerating angina Thursday,  January 28, 2020 @7 :30 am with Dr. Saunders Revel

## 2020-01-25 NOTE — Patient Instructions (Addendum)
Medication Instructions:     Labwork:  Your physician recommends that you return for non-fasting lab work on Tuesday, January 26, 2020 at [facility] in [place] to check your BMET & CBC-lab orders faxed.  Covid testing will be done after lab work. Your appointment is on 01/26/2020 @10 :77 am [place], [place] will need to go home and quarantine after your covid test until after your heart cath.    Testing/Procedures:  Your physician has requested that you have a cardiac catheterization. Cardiac catheterization is used to diagnose and/or treat various heart conditions. Doctors may recommend this procedure for a number of different reasons. The most common reason is to evaluate chest pain. Chest pain can be a symptom of coronary artery disease (CAD), and cardiac catheterization can show whether plaque is narrowing or blocking your heart's arteries. This procedure is also used to evaluate the valves, as well as measure the blood flow and oxygen levels in different parts of your heart. For further information please visit [email or web address]. Please follow instruction sheet, as given.  Follow-Up:  Your physician recommends that you schedule a follow-up appointment in: 1 month (office).  Any Other Special Instructions Will Be Listed Below (If Applicable).  If you need a refill on your cardiac medications before your next appointment, please call your pharmacy.     [address] [place] [id number] Dept: [phone] Loc: [place]  01/25/2020  You are scheduled for a Cardiac Catheterization on Thursday, March 18 with Dr. Harrell Gave End.  1. Please arrive at the [facility] (Main Entrance A) at [facility]: [place], [place] [address] at 5:30 AM (This time is two hours before your  procedure to ensure your preparation).  Special note: Every effort is made to have your procedure done on time. Please understand that emergencies sometimes delay scheduled procedures.  2. Diet: Do not eat solid foods after midnight.  The patient may have clear liquids until 5am upon the day of the procedure.  3. Labs: You will need to have blood drawn on Tuesday, March 16 at [address], [place]  Open: 7am - 6pm, Sat 8am - 12 noon   Phone: [phone]. You do not need to be fasting. Covid test @10 :30 am on 01/26/2020  4. Medication instructions in preparation for your procedure:   Contrast Allergy: No   On the morning of your procedure, take your Aspirin 81 mg and any morning medicines NOT listed above.  You may use sips of water.  5. Plan for one night stay--bring personal belongings. 6. Bring a current list of your medications and current insurance cards. 7. You MUST have a responsible person to drive you home. 8. Someone MUST be with you the first 24 hours after you arrive home or your discharge will be delayed. 9. Please wear clothes that are easy to get on and off and wear slip-on shoes.  Thank you for allowing [place] to care for you!   -- [facility]

## 2020-01-26 ENCOUNTER — Other Ambulatory Visit: Payer: Self-pay

## 2020-01-26 ENCOUNTER — Other Ambulatory Visit (HOSPITAL_COMMUNITY)
Admission: RE | Admit: 2020-01-26 | Discharge: 2020-01-26 | Disposition: A | Discharge status: Home / Self Care | Payer: Medicare Other | Source: Ambulatory Visit | Attending: Cardiology | Admitting: Cardiology

## 2020-01-26 DIAGNOSIS — I200 Unstable angina: Secondary | ICD-10-CM | POA: Diagnosis not present

## 2020-01-26 DIAGNOSIS — Z01810 Encounter for preprocedural cardiovascular examination: Secondary | ICD-10-CM | POA: Diagnosis not present

## 2020-01-26 DIAGNOSIS — Z20822 Contact with and (suspected) exposure to covid-19: Secondary | ICD-10-CM | POA: Insufficient documentation

## 2020-01-26 DIAGNOSIS — Z01812 Encounter for preprocedural laboratory examination: Secondary | ICD-10-CM | POA: Diagnosis not present

## 2020-01-26 LAB — SARS CORONAVIRUS 2 (TAT 6-24 HRS): SARS Coronavirus 2: NEGATIVE

## 2020-01-27 ENCOUNTER — Telehealth: Payer: Self-pay | Admitting: *Deleted

## 2020-01-27 LAB — BASIC METABOLIC PANEL
BUN: 20 mg/dL (ref 7–25)
CO2: 28 mmol/L (ref 20–32)
Calcium: 10.3 mg/dL (ref 8.6–10.4)
Chloride: 101 mmol/L (ref 98–110)
Creat: 0.79 mg/dL (ref 0.60–0.88)
Glucose, Bld: 99 mg/dL (ref 65–99)
Potassium: 4.2 mmol/L (ref 3.5–5.3)
Sodium: 138 mmol/L (ref 135–146)

## 2020-01-27 LAB — CBC
HCT: 33 % — ABNORMAL LOW (ref 35.0–45.0)
Hemoglobin: 10.5 g/dL — ABNORMAL LOW (ref 11.7–15.5)
MCH: 26.1 pg — ABNORMAL LOW (ref 27.0–33.0)
MCHC: 31.8 g/dL — ABNORMAL LOW (ref 32.0–36.0)
MCV: 82.1 fL (ref 80.0–100.0)
MPV: 9.2 fL (ref 7.5–12.5)
Platelets: 256 10*3/uL (ref 140–400)
RBC: 4.02 10*6/uL (ref 3.80–5.10)
RDW: 13.6 % (ref 11.0–15.0)
WBC: 3.8 10*3/uL (ref 3.8–10.8)

## 2020-01-27 NOTE — Telephone Encounter (Signed)
Pt contacted pre-catheterization scheduled at [facility] for: Thursday January 28, 2020 9 AM Verified arrival time and place: [facility]) at: 7 AM   No solid food after midnight prior to cath, clear liquids until 5 AM day of procedure.  Contrast allergy:  pt states she has had increased heart rate lasting short time after IV contrast administration,denies rash/itching/shortness of breath after IV contrast administration.   AM meds can be  taken pre-cath with sip of water including: ASA 81 mg   Confirmed patient has responsible adult to drive home post procedure and observe 24 hours after arriving home: yes  Currently, due to Covid-19 pandemic, only one person will be allowed with patient. Must be the same person for patient's entire stay and will be required to wear a mask. They will be asked to wait in the waiting room for the duration of the patient's stay.  Patients are required to wear a mask when they enter the hospital.     COVID-19 Pre-Screening Questions:  . In the past 7 to 10 days have you had a cough,  shortness of breath, headache, congestion, fever (100 or greater) body aches, chills, sore throat, or sudden loss of taste or sense of smell? Shortness of breath, not new . Have you been around anyone with known Covid 19 in 7-10 days? no . Have you been around anyone who is awaiting Covid 19 test results in the past 7 to 10 days? no . Have you been around anyone who has been exposed to Covid 19, or has mentioned symptoms of Covid 19 within the past 7 to 10 days? no    I reviewed procedure/mask/visitor instructions, COVID-19 screening questions with patient, she verbalized understanding, thanked me for call.  Pt states she received her second dose of COVID-19 vaccine last Friday.

## 2020-01-28 ENCOUNTER — Other Ambulatory Visit: Payer: Self-pay

## 2020-01-28 ENCOUNTER — Observation Stay (HOSPITAL_COMMUNITY)
Admission: RE | Admit: 2020-01-28 | Discharge: 2020-01-29 | Disposition: A | Discharge status: Home / Self Care | Payer: Medicare Other | Attending: Internal Medicine | Admitting: Internal Medicine

## 2020-01-28 ENCOUNTER — Encounter (HOSPITAL_COMMUNITY)
Admission: RE | Disposition: A | Discharge status: Home / Self Care | Payer: Medicare Other | Source: Home / Self Care | Attending: Internal Medicine

## 2020-01-28 ENCOUNTER — Other Ambulatory Visit (HOSPITAL_COMMUNITY): Payer: Medicare Other

## 2020-01-28 ENCOUNTER — Encounter (HOSPITAL_COMMUNITY): Payer: Medicare Other

## 2020-01-28 ENCOUNTER — Other Ambulatory Visit: Payer: Self-pay | Admitting: *Deleted

## 2020-01-28 DIAGNOSIS — I25110 Atherosclerotic heart disease of native coronary artery with unstable angina pectoris: Principal | ICD-10-CM | POA: Insufficient documentation

## 2020-01-28 DIAGNOSIS — Z79899 Other long term (current) drug therapy: Secondary | ICD-10-CM | POA: Diagnosis not present

## 2020-01-28 DIAGNOSIS — I2510 Atherosclerotic heart disease of native coronary artery without angina pectoris: Secondary | ICD-10-CM

## 2020-01-28 DIAGNOSIS — I2584 Coronary atherosclerosis due to calcified coronary lesion: Secondary | ICD-10-CM | POA: Insufficient documentation

## 2020-01-28 DIAGNOSIS — Z881 Allergy status to other antibiotic agents status: Secondary | ICD-10-CM | POA: Insufficient documentation

## 2020-01-28 DIAGNOSIS — F419 Anxiety disorder, unspecified: Secondary | ICD-10-CM | POA: Insufficient documentation

## 2020-01-28 DIAGNOSIS — Z7982 Long term (current) use of aspirin: Secondary | ICD-10-CM | POA: Insufficient documentation

## 2020-01-28 DIAGNOSIS — Z8249 Family history of ischemic heart disease and other diseases of the circulatory system: Secondary | ICD-10-CM | POA: Diagnosis not present

## 2020-01-28 DIAGNOSIS — Z95828 Presence of other vascular implants and grafts: Secondary | ICD-10-CM | POA: Diagnosis not present

## 2020-01-28 DIAGNOSIS — E1151 Type 2 diabetes mellitus with diabetic peripheral angiopathy without gangrene: Secondary | ICD-10-CM | POA: Insufficient documentation

## 2020-01-28 DIAGNOSIS — I10 Essential (primary) hypertension: Secondary | ICD-10-CM | POA: Diagnosis not present

## 2020-01-28 DIAGNOSIS — I701 Atherosclerosis of renal artery: Secondary | ICD-10-CM | POA: Diagnosis not present

## 2020-01-28 DIAGNOSIS — Z885 Allergy status to narcotic agent status: Secondary | ICD-10-CM | POA: Diagnosis not present

## 2020-01-28 DIAGNOSIS — E782 Mixed hyperlipidemia: Secondary | ICD-10-CM | POA: Insufficient documentation

## 2020-01-28 DIAGNOSIS — E119 Type 2 diabetes mellitus without complications: Secondary | ICD-10-CM | POA: Diagnosis not present

## 2020-01-28 DIAGNOSIS — Z7902 Long term (current) use of antithrombotics/antiplatelets: Secondary | ICD-10-CM | POA: Diagnosis not present

## 2020-01-28 DIAGNOSIS — M1990 Unspecified osteoarthritis, unspecified site: Secondary | ICD-10-CM | POA: Diagnosis not present

## 2020-01-28 DIAGNOSIS — I200 Unstable angina: Secondary | ICD-10-CM

## 2020-01-28 DIAGNOSIS — I714 Abdominal aortic aneurysm, without rupture: Secondary | ICD-10-CM | POA: Diagnosis not present

## 2020-01-28 HISTORY — PX: LEFT HEART CATH AND CORONARY ANGIOGRAPHY: CATH118249

## 2020-01-28 LAB — GLUCOSE, CAPILLARY
Glucose-Capillary: 119 mg/dL — ABNORMAL HIGH (ref 70–99)
Glucose-Capillary: 125 mg/dL — ABNORMAL HIGH (ref 70–99)
Glucose-Capillary: 169 mg/dL — ABNORMAL HIGH (ref 70–99)

## 2020-01-28 LAB — HEMOGLOBIN A1C
Hgb A1c MFr Bld: 6.5 % — ABNORMAL HIGH (ref 4.8–5.6)
Mean Plasma Glucose: 139.85 mg/dL

## 2020-01-28 LAB — SURGICAL PCR SCREEN
MRSA, PCR: NEGATIVE
Staphylococcus aureus: POSITIVE — AB

## 2020-01-28 SURGERY — LEFT HEART CATH AND CORONARY ANGIOGRAPHY
Anesthesia: LOCAL

## 2020-01-28 MED ORDER — FENTANYL CITRATE (PF) 100 MCG/2ML IJ SOLN
INTRAMUSCULAR | Status: DC | PRN
  Administered 2020-01-28: 25 ug via INTRAVENOUS
  Administered 2020-01-28: 12.5 ug via INTRAVENOUS

## 2020-01-28 MED ORDER — HEPARIN (PORCINE) IN NACL 1000-0.9 UT/500ML-% IV SOLN
INTRAVENOUS | Status: AC
  Filled 2020-01-28: qty 1000

## 2020-01-28 MED ORDER — LABETALOL HCL 100 MG PO TABS
100.0000 mg | ORAL_TABLET | Freq: Two times a day (BID) | ORAL | Status: DC
  Administered 2020-01-28 – 2020-01-29 (×2): 100 mg via ORAL
  Filled 2020-01-28 (×2): qty 1

## 2020-01-28 MED ORDER — LIDOCAINE HCL (PF) 1 % IJ SOLN
INTRAMUSCULAR | Status: DC | PRN
  Administered 2020-01-28: 2 mL

## 2020-01-28 MED ORDER — NITROGLYCERIN 0.4 MG SL SUBL
SUBLINGUAL_TABLET | SUBLINGUAL | Status: AC
  Filled 2020-01-28: qty 1

## 2020-01-28 MED ORDER — MIDAZOLAM HCL 2 MG/2ML IJ SOLN
INTRAMUSCULAR | Status: DC | PRN
  Administered 2020-01-28: 1 mg via INTRAVENOUS

## 2020-01-28 MED ORDER — CLONAZEPAM 0.5 MG PO TABS: 0.2500 mg | ORAL_TABLET | Freq: Every evening | ORAL | Status: DC | PRN

## 2020-01-28 MED ORDER — ASPIRIN 81 MG PO CHEW: 81.0000 mg | CHEWABLE_TABLET | ORAL | Status: DC

## 2020-01-28 MED ORDER — INSULIN ASPART 100 UNIT/ML ~~LOC~~ SOLN
0.0000 [IU] | Freq: Three times a day (TID) | SUBCUTANEOUS | Status: DC
  Administered 2020-01-28 – 2020-01-29 (×2): 1 [IU] via SUBCUTANEOUS

## 2020-01-28 MED ORDER — LIDOCAINE HCL (PF) 1 % IJ SOLN
INTRAMUSCULAR | Status: AC
  Filled 2020-01-28: qty 30

## 2020-01-28 MED ORDER — IOHEXOL 350 MG/ML SOLN
INTRAVENOUS | Status: DC | PRN
  Administered 2020-01-28: 45 mL

## 2020-01-28 MED ORDER — ACETAMINOPHEN 325 MG PO TABS: 650.0000 mg | ORAL_TABLET | ORAL | Status: DC | PRN

## 2020-01-28 MED ORDER — ASPIRIN EC 81 MG PO TBEC
81.0000 mg | DELAYED_RELEASE_TABLET | Freq: Every day | ORAL | Status: DC
  Administered 2020-01-29: 81 mg via ORAL
  Filled 2020-01-28: qty 1

## 2020-01-28 MED ORDER — NITROGLYCERIN 0.4 MG SL SUBL
SUBLINGUAL_TABLET | SUBLINGUAL | Status: DC | PRN
  Administered 2020-01-28: .4 mg via SUBLINGUAL

## 2020-01-28 MED ORDER — ONDANSETRON HCL 4 MG/2ML IJ SOLN: 4.0000 mg | Freq: Four times a day (QID) | INTRAMUSCULAR | Status: DC | PRN

## 2020-01-28 MED ORDER — SODIUM CHLORIDE 0.9% FLUSH: 3.0000 mL | INTRAVENOUS | Status: DC | PRN

## 2020-01-28 MED ORDER — SODIUM CHLORIDE 0.9 % IV SOLN: INTRAVENOUS | Status: AC

## 2020-01-28 MED ORDER — MIDAZOLAM HCL 2 MG/2ML IJ SOLN
INTRAMUSCULAR | Status: AC
  Filled 2020-01-28: qty 2

## 2020-01-28 MED ORDER — PANTOPRAZOLE SODIUM 40 MG PO TBEC
40.0000 mg | DELAYED_RELEASE_TABLET | Freq: Every day | ORAL | Status: DC
  Administered 2020-01-28 – 2020-01-29 (×2): 40 mg via ORAL
  Filled 2020-01-28 (×2): qty 1

## 2020-01-28 MED ORDER — HYDRALAZINE HCL 20 MG/ML IJ SOLN: 10.0000 mg | INTRAMUSCULAR | Status: AC | PRN

## 2020-01-28 MED ORDER — ROSUVASTATIN CALCIUM 5 MG PO TABS
5.0000 mg | ORAL_TABLET | Freq: Every day | ORAL | Status: DC
  Administered 2020-01-28: 5 mg via ORAL
  Filled 2020-01-28: qty 1

## 2020-01-28 MED ORDER — HEPARIN SODIUM (PORCINE) 1000 UNIT/ML IJ SOLN
INTRAMUSCULAR | Status: DC | PRN
  Administered 2020-01-28: 3000 [IU] via INTRAVENOUS

## 2020-01-28 MED ORDER — CLONAZEPAM 0.25 MG PO TBDP
0.2500 mg | ORAL_TABLET | Freq: Every evening | ORAL | Status: DC | PRN
  Administered 2020-01-28: 0.25 mg via ORAL
  Filled 2020-01-28: qty 1

## 2020-01-28 MED ORDER — SODIUM CHLORIDE 0.9 % IV SOLN: 250.0000 mL | INTRAVENOUS | Status: DC | PRN

## 2020-01-28 MED ORDER — SODIUM CHLORIDE 0.9% FLUSH
3.0000 mL | Freq: Two times a day (BID) | INTRAVENOUS | Status: DC
  Administered 2020-01-28 – 2020-01-29 (×3): 3 mL via INTRAVENOUS

## 2020-01-28 MED ORDER — SODIUM CHLORIDE 0.9 % WEIGHT BASED INFUSION
3.0000 mL/kg/h | INTRAVENOUS | Status: DC
  Administered 2020-01-28: 3 mL/kg/h via INTRAVENOUS

## 2020-01-28 MED ORDER — SODIUM CHLORIDE 0.9 % WEIGHT BASED INFUSION
1.0000 mL/kg/h | INTRAVENOUS | Status: DC
  Administered 2020-01-28: 250 mL via INTRAVENOUS

## 2020-01-28 MED ORDER — ISOSORBIDE MONONITRATE ER 60 MG PO TB24
60.0000 mg | ORAL_TABLET | Freq: Every day | ORAL | Status: DC
  Administered 2020-01-28: 60 mg via ORAL
  Filled 2020-01-28: qty 1

## 2020-01-28 MED ORDER — ENOXAPARIN SODIUM 40 MG/0.4ML ~~LOC~~ SOLN
40.0000 mg | SUBCUTANEOUS | Status: DC
  Administered 2020-01-29: 40 mg via SUBCUTANEOUS
  Filled 2020-01-28: qty 0.4

## 2020-01-28 MED ORDER — HEPARIN SODIUM (PORCINE) 1000 UNIT/ML IJ SOLN
INTRAMUSCULAR | Status: AC
  Filled 2020-01-28: qty 1

## 2020-01-28 MED ORDER — VERAPAMIL HCL 2.5 MG/ML IV SOLN
INTRAVENOUS | Status: AC
  Filled 2020-01-28: qty 2

## 2020-01-28 MED ORDER — FENTANYL CITRATE (PF) 100 MCG/2ML IJ SOLN
INTRAMUSCULAR | Status: AC
  Filled 2020-01-28: qty 2

## 2020-01-28 MED ORDER — HEPARIN (PORCINE) IN NACL 1000-0.9 UT/500ML-% IV SOLN
INTRAVENOUS | Status: DC | PRN
  Administered 2020-01-28 (×2): 500 mL

## 2020-01-28 MED ORDER — AMLODIPINE BESYLATE 2.5 MG PO TABS
2.5000 mg | ORAL_TABLET | Freq: Two times a day (BID) | ORAL | Status: DC
  Administered 2020-01-28 – 2020-01-29 (×2): 2.5 mg via ORAL
  Filled 2020-01-28 (×2): qty 1

## 2020-01-28 MED ORDER — LABETALOL HCL 5 MG/ML IV SOLN: 10.0000 mg | INTRAVENOUS | Status: AC | PRN

## 2020-01-28 MED ORDER — MUPIROCIN 2 % EX OINT
1.0000 "application " | TOPICAL_OINTMENT | Freq: Two times a day (BID) | CUTANEOUS | Status: DC
  Administered 2020-01-28 – 2020-01-29 (×3): 1 via NASAL
  Filled 2020-01-28: qty 22

## 2020-01-28 MED ORDER — VERAPAMIL HCL 2.5 MG/ML IV SOLN
INTRAVENOUS | Status: DC | PRN
  Administered 2020-01-28: 10 mL via INTRA_ARTERIAL

## 2020-01-28 SURGICAL SUPPLY — 10 items
CATH 5FR JL3.5 JR4 ANG PIG MP (CATHETERS) ×2 IMPLANT
DEVICE RAD COMP TR BAND LRG (VASCULAR PRODUCTS) ×2 IMPLANT
GLIDESHEATH SLEND SS 6F .021 (SHEATH) ×2 IMPLANT
GUIDEWIRE INQWIRE 1.5J.035X260 (WIRE) ×1 IMPLANT
INQWIRE 1.5J .035X260CM (WIRE) ×2
KIT HEART LEFT (KITS) ×2 IMPLANT
PACK CARDIAC CATHETERIZATION (CUSTOM PROCEDURE TRAY) ×2 IMPLANT
TRANSDUCER W/STOPCOCK (MISCELLANEOUS) ×2 IMPLANT
TUBING CIL FLEX 10 FLL-RA (TUBING) ×2 IMPLANT
WIRE HI TORQ VERSACORE-J 145CM (WIRE) ×2 IMPLANT

## 2020-01-28 NOTE — Progress Notes (Signed)
TCTS consulted for CABG evaluation. °

## 2020-01-28 NOTE — Progress Notes (Signed)
Patient transferred from cath lab at 1351 hrs.  Right wrist site clean, dry and intact with gauze dressing in place, level zero.  Patient given post cath instructions verbalized understanding. Ambulating in room without problems.

## 2020-01-28 NOTE — Consult Note (Addendum)
[address]       [place] [address]             [phone]        [name]  Medical Record [id number] Date of Birth: 1936/03/27  Referring: End Primary Care: Glenda Chroman, MD Primary Cardiologist:Samuel Domenic Polite, MD  Chief Complaint: CAD  History of Present Illness:      [name] is an 84 yo female with PMH of HTN, Hyperlipidemia, Penetrating, atherosclerotic Ulcer of Abdominal Aorta with stent graft, history of Renal artery stenosis with stents, H/O left carotid endarterectomy, claudication, and Ectopic Atrial Tachycardia.  The patient presented with a 5 month history of chest pain.  She states that this occurs with minimal exertion and usually relieves with rest.  She states that she currently has not been active because she cant even walk to the mailbox without developing chest pain and shortness of breath.  She is unable to clean her house without stopping multiple times.  She was sent for a stress test in December which showed a likely prior MI and a mild degree of peri-infarct ischemia.  Her EF was preserved. She was evaluated by Dr. Domenic Polite who recommended cardiac catheterization in January, however the patient initially declined and wished to speak with her daughter.  They spoke again via virtual visit in February at which time her symptoms persisted despite adjustment of medications.  She was agreeable to undergo catheterization at that time. However she canceled her scheduled procedure due to the weather.  Her cath was rescheduled and was performed today by Dr. Saunders Revel and shows 2 vessel CAD.  It was felt coronary bypass grafting would be indicated, however the lesions would be possibly treated with stents if the patient is felt not to be a candidate.  Currently the patient is chest pain free.  I attempted to speak with regarding possible bypass procedure.  However, multiple times during the discussion the patient stated she wanted to have stents  placed and did not wish to proceed with coronary bypass procedure.  She is familiar with the surgery as her sister and brother have both had the surgery.  She again states how inactive she has been due to her symptoms of chest pain, shortness of breath and fatigue.  She states it doesn't take much to bring on her symptoms and she has to rest for a bit before she can resume.  She has a history of smoking in the past but quit 40 years ago.    Current Activity/ Functional Status: Patient was independent with mobility/ambulation, transfers, ADL's, IADL's.   Zubrod Score: At the time of surgery this patient's most appropriate activity status/level should be described as: []     0    Normal activity, no symptoms []     1    Restricted in physical strenuous activity but ambulatory, able to do out light work [x]     2    Ambulatory and capable of self care, unable to do work activities, up and about                 more than 50%  Of the time                            []     3    Only limited self care, in bed greater than 50% of waking hours []     4  Completely disabled, no self care, confined to bed or chair []     5    Moribund  Past Medical History:  Diagnosis Date  . Anemia   . Anxiety   . Arthritis   . Chronic constipation   . Claudication [facility])    Chronic occlusion of the right limb of aortic stent graft.  . Diverticulosis    Left sided noted on colonoscopy 02/2006  . Ectopic atrial tachycardia ([place])   . Esophageal reflux disease   . Essential hypertension   . Hiatal hernia   . Hyperlipidemia   . Lumbar radiculopathy   . Macular degeneration   . Migraines   . Non-obstructive CAD    a. Reportedly nonobstructive at catheterization in the 1990s;  b. 03/2014 Cath: LM nl, LAD 30-40p, 46m, D1 20ost, D2 50ost/p, D3 small, LCX nl/nondominant, OM 1/2/3 nl, RCA 20-30p, 9m, PDA nl, RPL1/2  small, nl.  . Penetrating atherosclerotic ulcer of aorta (HCC)    Complex ulcerated plaque of the infrarenal  abdominal aorta mild renal artery stenosis on the right side, normal left renal artery catheterization 2009, status post aortic stent graft  . Renal artery stenosis ([place])    a. Right renal artery stent April 2013 - Dr. Fletcher Anon;  b. 03/2014 PTA/stenting of RRA 2/2 ISR: 6x18 Herculink stent.  . Trigeminal neuralgia    Right  . Type 2 diabetes mellitus ([place])     Past Surgical History:  Procedure Laterality Date  . ABDOMINAL AORTAGRAM N/A 02/13/2012   Procedure: ABDOMINAL Maxcine Ham;  Surgeon: Wellington Hampshire, MD;  Location: [place] CATH LAB;  Service: Cardiovascular;  Laterality: N/A;  . ABDOMINAL AORTAGRAM N/A 03/17/2014   Procedure: ABDOMINAL Maxcine Ham;  Surgeon: Wellington Hampshire, MD;  Location: [place] CATH LAB;  Service: Cardiovascular;  Laterality: N/A;  . ABDOMINAL HYSTERECTOMY  1982  . BREAST BIOPSY Left 1990's  . CAROTID ENDARTERECTOMY Left 1993  . Endologix powerlink bifurcated     Covered stent graft  . LEFT HEART CATHETERIZATION WITH CORONARY ANGIOGRAM N/A 03/17/2014   Procedure: LEFT HEART CATHETERIZATION WITH CORONARY ANGIOGRAM;  Surgeon: Wellington Hampshire, MD;  Location: [place] CATH LAB;  Service: Cardiovascular;  Laterality: N/A;  . LUMBAR [place]  . PERCUTANEOUS STENT INTERVENTION Right 03/17/2014   Procedure: PERCUTANEOUS STENT INTERVENTION;  Surgeon: Wellington Hampshire, MD;  Location: [place] CATH LAB;  Service: Cardiovascular;  Laterality: Right;  Renal  . RENAL ANGIOGRAM N/A 02/13/2012   Procedure: RENAL ANGIOGRAM;  Surgeon: Wellington Hampshire, MD;  Location: [facility] CATH LAB;  Service: Cardiovascular;  Laterality: N/A;  . RENAL ANGIOGRAM N/A 03/17/2014   Procedure: RENAL ANGIOGRAM;  Surgeon: Wellington Hampshire, MD;  Location: [facility] CATH LAB;  Service: Cardiovascular;  Laterality: N/A;  . TONSILLECTOMY AND ADENOIDECTOMY  1947    Social History   Tobacco Use  Smoking Status Former Smoker  . Packs/day: 0.20  . Years: 1.00  . Pack years: 0.20  . Types: Cigarettes  . Quit date: 11/13/1979  . Years since  quitting: 40.2  Smokeless Tobacco Never Used    Social History   Substance and Sexual Activity  Alcohol Use No  . Alcohol/week: 0.0 standard drinks     Allergies  Allergen Reactions  . Cefixime Shortness Of Breath    Patient is not familiar with this listed allergy  . Conjugated Estrogens Other (See Comments)    REACTION: flushing  . Morphine And Related Other (See Comments)    Morphine injection Neck pain  . Codeine  Other (See Comments)    REACTION: dizziness  . Iohexol      Desc: pt has anxiety and an irregular heartbeat. contrast exacerbates this. pt was very uncomfortable after a 20cc bolus for an miroi. we did w/o iv 11/09 per dr. Kris Hartmann. pt will talk w/ md about this and will probably not get iv contrast in the future at her re, Onset Date: [id number]   . Adenosine Other (See Comments)    Elevated heart rate when doing stress test    Current Facility-Administered Medications  Medication Dose Route Frequency Provider Last Rate Last Admin  . 0.9 %  sodium chloride infusion  250 mL Intravenous PRN [name], MD      . 0.9 %  sodium chloride infusion   Intravenous Continuous End, Christopher, MD 75 mL/hr at 01/28/20 0930 Rate Change at 01/28/20 0930  . 0.9% sodium chloride infusion  1 mL/kg/hr Intravenous Continuous [name], MD   Stopped at 01/28/20 0930  . amLODipine (NORVASC) tablet 2.5 mg  2.5 mg Oral BID End, Harrell Gave, MD      . aspirin chewable tablet 81 mg  81 mg Oral Pre-Cath [name], MD      . clonazePAM Bobbye [place]) tablet 0.25 mg  0.25 mg Oral QHS PRN End, Harrell Gave, MD      . hydrALAZINE (APRESOLINE) injection 10 mg  10 mg Intravenous Q20 Min PRN End, Harrell Gave, MD      . labetalol (NORMODYNE) injection 10 mg  10 mg Intravenous Q10 min PRN End, Harrell Gave, MD      . sodium chloride flush (NS) 0.9 % injection 3 mL  3 mL Intravenous PRN [name], MD        Facility-Administered Medications Prior to Admission  Medication  Dose Route Frequency Provider Last Rate Last Admin  . sodium chloride flush (NS) 0.9 % injection 3 mL  3 mL Intravenous Q12H [name], MD      . sodium chloride flush (NS) 0.9 % injection 3 mL  3 mL Intravenous Q12H [name], MD       Medications Prior to Admission  Medication Sig Dispense Refill Last Dose  . acetaminophen (TYLENOL) 325 MG tablet Take 325 mg by mouth every 6 (six) hours as needed. For pain.   Past Week at Unknown time  . amLODipine (NORVASC) 2.5 MG tablet Take 2.5 mg by mouth 2 (two) times daily.   01/28/2020 at 0600  . aspirin EC 81 MG tablet Take 81 mg by mouth at bedtime.    01/28/2020 at 0600  . Cholecalciferol (VITAMIN D3) 1000 UNITS CAPS Take 1,000 Units by mouth daily.    01/27/2020 at Unknown time  . clonazePAM (KLONOPIN) 0.5 MG tablet Take 0.25 mg by mouth at bedtime as needed for anxiety.    01/27/2020 at Unknown time  . isosorbide mononitrate (IMDUR) 30 MG 24 hr tablet Take 1 tablet (30 mg total) by mouth at bedtime. 90 tablet 2 01/27/2020 at Unknown time  . labetalol (NORMODYNE) 200 MG tablet Take 100 mg by mouth 2 (two) times daily.   01/28/2020 at 0600  . Multiple Vitamins-Minerals (PRESERVISION/LUTEIN) CAPS Take 1 capsule by mouth 2 (two) times daily.    01/27/2020 at Unknown time  . pantoprazole (PROTONIX) 40 MG tablet Take 40 mg by mouth daily.   01/27/2020 at Unknown time  . polyethylene glycol powder (GLYCOLAX/MIRALAX) 17 GM/SCOOP powder Take 17 g by mouth 2 (two) times a week. As needed  Past Month at Unknown time  . rosuvastatin (CRESTOR) 5 MG tablet TAKE 1 TABLET DAILY (Patient taking differently: Take 5 mg by mouth at bedtime. ) 90 tablet 3 01/27/2020 at Unknown time  . cloNIDine (CATAPRES) 0.1 MG tablet Take 0.1 mg by mouth 2 (two) times daily as needed (SBP>170). Reported on 02/15/2016   More than a month at Unknown time    Family History  Problem Relation Age of Onset  . Heart failure Mother   . Coronary artery disease Mother   . Heart  disease Mother   . Hypertension Mother   . Stroke Mother   . Heart attack Father   . Heart disease Father   . Hypertension Father   . Heart disease Brother   . Hypertension Sister   . Parkinson's disease Sister   . Kidney disease Sister   . Stroke Sister   . Hypertension Brother   . Stroke Sister   . Hypertension Sister   . Breast cancer Neg Hx    Review of Systems:     Cardiac Review of Systems: Y or  [    ]= no  Chest Pain [ Y   ]  Resting SOB [   ] Exertional SOB  [Y  ]  Orthopnea [  ]   Pedal Edema [   ]    Palpitations Aqua.Slicker  ] Syncope  [  ]   Presyncope [   ]  General Review of Systems: [Y] = yes [  ]=no Constitional: recent weight change [  ]; anorexia [  ]; fatigue [Y  ]; nausea [N]; night sweats [  ]; fever Aqua.Slicker  ]; or chills [  ]                                                               Dental: Last Dentist visit:   Eye : blurred vision [  ]; diplopia [   ]; vision changes [  ];  Amaurosis fugax[  ]; Resp: cough [  ];  wheezing[  ];  hemoptysis[  ]; shortness of breath[ Y ]; paroxysmal nocturnal dyspnea[  ]; dyspnea on exertion[Y  ]; or orthopnea[  ];  GI:  gallstones[  ], vomiting[  ];  dysphagia[  ]; melena[  ];  hematochezia [  ]; heartburn[  ];   Hx of  Colonoscopy[  ]; GU: kidney stones [  ]; hematuria[  ];   dysuria [Y, h/o cystocele  ];  nocturia[  ];  history of     obstruction [  ]; urinary frequency [  ]             Skin: rash, swelling[  ];, hair loss[  ];  peripheral edema[N  ];  or itching[  ]; Musculosketetal: myalgias[  ];  joint swelling[  ];  joint erythema[  ];  joint pain[  ];  back pain[  ];  Heme/Lymph: bruising[  ];  bleeding[  ];  anemia[  ];  Neuro: TIA[  ];  headaches[  ];  Stroke[Y. Patient states identified on scan?  ];  vertigo[  ];  seizures[  ];   paresthesias[  ];  difficulty walking[  ];  Psych:depression[  ]; anxiety[  ];  Endocrine: diabetes[ N ];  thyroid dysfunction[N  ];  Physical Exam: BP (!) 168/49   Pulse 81   Temp 97.6 F  (36.4 C) (Oral)   Resp 13   Ht 5' 1.5" (1.562 m)   Wt 55.8 kg   SpO2 99%   BMI 22.86 kg/m   General appearance: alert, cooperative and no distress Head: Normocephalic, without obvious abnormality, atraumatic Lymph nodes: Cervical, supraclavicular, and axillary nodes normal. Resp: clear to auscultation bilaterally Cardio: regular rate and rhythm GI: soft, non-tender; bowel sounds normal; no masses,  no organomegaly Extremities: extremities normal, atraumatic, no cyanosis or edema Neurologic: Grossly normal  Diagnostic Studies & Laboratory data:     Recent Radiology Findings:   CARDIAC CATHETERIZATION  Result Date: 01/28/2020 Conclusions: 1. Severe two-vessel coronary artery disease, including 70% proximal LAD stenosis involving large D1 branch, as well as heavily calcified 99% mid RCA stenosis with faint left-to-right collaterals.  There is mild diffuse disease involving the LMCA and LCx. 2. Normal left ventricular systolic function and filling pressure. Recommendations: 1. As the patient experienced angina during the procedure and was found to have significant two-vessel coronary artery disease, we will admit her for medical optimization and cardiac surgery consultation for CABG.  If [name] is not a surgical candidate, we could consider PCI to the LAD/D1, though she would need to be challenged with a P2Y12 inhibitor first given history of significant epistaxis in the past while on clopidogrel.  I do not believe that the RCA stenosis is treatable at this time from a percutaneous approach, given heavy calcification and severe tortuosity that would make atherectomy difficult and high risk for dissection or perforation. 2. Aggressive secondary prevention.  We will check a lipid panel with AM labs to determine need for escalation of statin therapy. 3. Obtain echocardiogram. Nelva Bush, MD [facility]     I have independently reviewed the above radiologic studies and discussed with the  patient   Recent Lab Findings: Lab Results  Component Value Date   WBC 3.8 01/26/2020   HGB 10.5 (L) 01/26/2020   HCT 33.0 (L) 01/26/2020   PLT 256 01/26/2020   GLUCOSE 99 01/26/2020   CHOL  12/21/2007    125        ATP III CLASSIFICATION:  <200     mg/dL   Desirable  200-239  mg/dL   Borderline High  >=240    mg/dL   High   TRIG 114 12/21/2007   HDL 33 (L) 12/21/2007   LDLCALC  12/21/2007    69        Total Cholesterol/HDL:CHD Risk Coronary Heart Disease Risk Table                     Men   Women  1/2 Average Risk   3.4   3.3   ALT 18 02/26/2014   AST 21 02/26/2014   NA 138 01/26/2020   K 4.2 01/26/2020   CL 101 01/26/2020   CREATININE 0.79 01/26/2020   BUN 20 01/26/2020   CO2 28 01/26/2020   TSH 1.644 Test methodology is 3rd generation TSH 12/20/2007   INR 0.86 03/17/2014   HGBA1C (H) 12/21/2007    6.8 (NOTE)   The ADA recommends the following therapeutic goals for glycemic   control related to Hgb A1C measurement:   Goal of Therapy:   < 7.0% Hgb A1C   Action Suggested:  > 8.0% Hgb A1C   Ref:  Diabetes Care, 22, Suppl. 1, 1999    Assessment / Plan:  1. CAD- request was made for CABG, however multiple times during discussion she states she didn't wish to proceed with coronary bypass grafting.  She would like to have stents placed 2. H/O HTN with RAS- continue antihypertensives 3. Hyperlipidemia 4. Dispo- patient currently chest pain free, she has multiple medical problems, she also lives alone and is frail.  She would likely be a higher risk surgical candidate.  However, patient statd multiple times she did not wish to proceed with surgery and would prefer stents to be placed.  Dr. Servando Snare is aware of patient and will follow up with further recommendations.  I  spent 55 minutes counseling the patient face to face.   Ellwood Handler, PA-C  01/28/2020 1:22 PM  Patient seen and history reviewed, she has history of significant cerebrovascular peripheral vascular and  renal vascular disease.  He has had penetrating aortic ulcer treated previously with stenting.  He has had 4 to 5 months of anginal symptoms, she notes this is particularly true walking to her mailbox.  She notes she is able to walk around the house without chest discomfort.  Cardiac cath films were reviewed, I have discussed with the patient the expectations and risks of coronary artery bypass grafting in detail.  She notes she is very reluctant to have any surgery, citing her age.  The patient does carry increased risk for surgery with her underlying renal vascular cerebrovascular and peripheral vascular disease, fragility, and age.  At this point she does not wish to proceed with coronary artery bypass grafting.  I have seen and examined [name] and agree with the above assessment  and plan.  Grace Isaac MD [facility] [phone] Office [phone] 01/28/2020 5:26 PM

## 2020-01-28 NOTE — Interval H&P Note (Signed)
History and Physical Interval Note:  01/28/2020 7:47 AM  [name]  has presented today for cardiac catheterization, with the diagnosis of accelerating angina.  The various methods of treatment have been discussed with the patient and family. After consideration of risks, benefits and other options for treatment, the patient has consented to  Procedure(s): LEFT HEART CATH AND CORONARY ANGIOGRAPHY (N/A) as a surgical intervention.  The patient's history has been reviewed, patient examined, no change in status, stable for surgery.  I have reviewed the patient's chart and labs.  Questions were answered to the patient's satisfaction.    Cath Lab Visit (complete for each Cath Lab visit)  Clinical Evaluation Leading to the Procedure:   ACS: No.  Non-ACS:    Anginal Classification: CCS III  Anti-ischemic medical therapy: Maximal Therapy (2 or more classes of medications)  Non-Invasive Test Results: Low-risk stress test findings: cardiac mortality <1%/year  Prior CABG: No previous CABG  [name]

## 2020-01-29 DIAGNOSIS — F419 Anxiety disorder, unspecified: Secondary | ICD-10-CM | POA: Diagnosis not present

## 2020-01-29 DIAGNOSIS — I701 Atherosclerosis of renal artery: Secondary | ICD-10-CM | POA: Diagnosis not present

## 2020-01-29 DIAGNOSIS — E782 Mixed hyperlipidemia: Secondary | ICD-10-CM | POA: Diagnosis not present

## 2020-01-29 DIAGNOSIS — I25110 Atherosclerotic heart disease of native coronary artery with unstable angina pectoris: Secondary | ICD-10-CM | POA: Diagnosis not present

## 2020-01-29 DIAGNOSIS — E1151 Type 2 diabetes mellitus with diabetic peripheral angiopathy without gangrene: Secondary | ICD-10-CM | POA: Diagnosis not present

## 2020-01-29 DIAGNOSIS — I2584 Coronary atherosclerosis due to calcified coronary lesion: Secondary | ICD-10-CM | POA: Diagnosis not present

## 2020-01-29 DIAGNOSIS — I200 Unstable angina: Secondary | ICD-10-CM

## 2020-01-29 DIAGNOSIS — I10 Essential (primary) hypertension: Secondary | ICD-10-CM | POA: Diagnosis not present

## 2020-01-29 LAB — COMPREHENSIVE METABOLIC PANEL
ALT: 16 U/L (ref 0–44)
AST: 18 U/L (ref 15–41)
Albumin: 3.4 g/dL — ABNORMAL LOW (ref 3.5–5.0)
Alkaline Phosphatase: 70 U/L (ref 38–126)
Anion gap: 8 (ref 5–15)
BUN: 21 mg/dL (ref 8–23)
CO2: 26 mmol/L (ref 22–32)
Calcium: 9.7 mg/dL (ref 8.9–10.3)
Chloride: 103 mmol/L (ref 98–111)
Creatinine, Ser: 0.8 mg/dL (ref 0.44–1.00)
GFR calc Af Amer: >60 mL/min (ref >60)
GFR calc non Af Amer: >60 mL/min (ref >60)
Glucose, Bld: 117 mg/dL — ABNORMAL HIGH (ref 70–99)
Potassium: 3.8 mmol/L (ref 3.5–5.1)
Sodium: 137 mmol/L (ref 135–145)
Total Bilirubin: 0.4 mg/dL (ref 0.3–1.2)
Total Protein: 5.4 g/dL — ABNORMAL LOW (ref 6.5–8.1)

## 2020-01-29 LAB — CBC
HCT: 27.7 % — ABNORMAL LOW (ref 36.0–46.0)
Hemoglobin: 8.6 g/dL — ABNORMAL LOW (ref 12.0–15.0)
MCH: 26.1 pg (ref 26.0–34.0)
MCHC: 31 g/dL (ref 30.0–36.0)
MCV: 84.2 fL (ref 80.0–100.0)
Platelets: 222 10*3/uL (ref 150–400)
RBC: 3.29 MIL/uL — ABNORMAL LOW (ref 3.87–5.11)
RDW: 14 % (ref 11.5–15.5)
WBC: 3.7 10*3/uL — ABNORMAL LOW (ref 4.0–10.5)
nRBC: 0 % (ref 0.0–0.2)

## 2020-01-29 LAB — GLUCOSE, CAPILLARY: Glucose-Capillary: 129 mg/dL — ABNORMAL HIGH (ref 70–99)

## 2020-01-29 LAB — LIPID PANEL
Cholesterol: 130 mg/dL (ref 0–200)
HDL: 38 mg/dL — ABNORMAL LOW (ref >40)
LDL Cholesterol: 71 mg/dL (ref 0–99)
Total CHOL/HDL Ratio: 3.4 RATIO
Triglycerides: 105 mg/dL (ref <150)
VLDL: 21 mg/dL (ref 0–40)

## 2020-01-29 LAB — HEMOGLOBIN A1C
Hgb A1c MFr Bld: 6.5 % — ABNORMAL HIGH (ref 4.8–5.6)
Mean Plasma Glucose: 139.85 mg/dL

## 2020-01-29 MED ORDER — ROSUVASTATIN CALCIUM 20 MG PO TABS: 20.0000 mg | ORAL_TABLET | Freq: Every day | ORAL | 3 refills | Status: DC

## 2020-01-29 MED ORDER — LABETALOL HCL 100 MG PO TABS: 100.0000 mg | ORAL_TABLET | Freq: Two times a day (BID) | ORAL | 4 refills | Status: DC

## 2020-01-29 MED ORDER — ROSUVASTATIN CALCIUM 20 MG PO TABS: 20.0000 mg | ORAL_TABLET | Freq: Every day | ORAL | Status: DC

## 2020-01-29 MED ORDER — ISOSORBIDE MONONITRATE ER 60 MG PO TB24: 60.0000 mg | ORAL_TABLET | Freq: Every day | ORAL | 3 refills | Status: DC

## 2020-01-29 MED ORDER — ROSUVASTATIN CALCIUM 5 MG PO TABS: 10.0000 mg | ORAL_TABLET | Freq: Every day | ORAL | Status: DC

## 2020-01-29 NOTE — Progress Notes (Addendum)
Progress Note  Patient Name: [name] Date of Encounter: 01/29/2020  Primary Cardiologist: Dr. Domenic Polite, MD  Subjective   Feeling well today. Had one recurrent episode of chest pain this AM which has resolved. Still uninterested in CABG per discussion today. Will need to plan PCI intervention. Denies curretn CP, SOB.   Inpatient Medications    Scheduled Meds: . amLODipine  2.5 mg Oral BID  . aspirin EC  81 mg Oral Daily  . enoxaparin (LOVENOX) injection  40 mg Subcutaneous Q24H  . insulin aspart  0-9 Units Subcutaneous TID WC  . isosorbide mononitrate  60 mg Oral QHS  . labetalol  100 mg Oral BID  . mupirocin ointment  1 application Nasal BID  . pantoprazole  40 mg Oral Daily  . rosuvastatin  5 mg Oral QHS  . sodium chloride flush  3 mL Intravenous Q12H   Continuous Infusions: . sodium chloride     PRN Meds: sodium chloride, acetaminophen, clonazepam, ondansetron (ZOFRAN) IV, sodium chloride flush   Vital Signs    Vitals:   01/28/20 1434 01/28/20 1809 01/28/20 2030 01/29/20 0504  BP: (!) 156/75 (!) 150/83 (!) 178/83 (!) 111/57  Pulse: 82 77 79 82  Resp: 18 18 16 16   Temp:  98.2 F (36.8 C) 99 F (37.2 C) 98.4 F (36.9 C)  TempSrc:  Oral Oral Oral  SpO2: 98% 97% 97% 97%  Weight:      Height:        Intake/Output Summary (Last 24 hours) at 01/29/2020 0650 Last data filed at 01/28/2020 2147 Gross per 24 hour  Intake 1483 ml  Output --  Net 1483 ml   Filed Weights   01/28/20 0710 01/28/20 1351  Weight: 55.8 kg 50.5 kg    Physical Exam   General: Well developed, well nourished, NAD Neck: Negative for carotid bruits. No JVD Lungs:Clear to ausculation bilaterally. No wheezes, rales, or rhonchi. Breathing is unlabored. Cardiovascular: RRR with S1 S2. No murmurs Abdomen: Soft, non-tender, non-distended. No obvious abdominal masses. Extremities: No edema. Radial/P/PT pulses 2+ bilaterally Neuro: Alert and oriented. No focal deficits. No facial  asymmetry. MAE spontaneously. Psych: Responds to questions appropriately with normal affect.    Labs    Chemistry Recent Labs  Lab 01/26/20 1024 01/29/20 0202  NA 138 137  K 4.2 3.8  CL 101 103  CO2 28 26  GLUCOSE 99 117*  BUN 20 21  CREATININE 0.79 0.80  CALCIUM 10.3 9.7  PROT  --  5.4*  ALBUMIN  --  3.4*  AST  --  18  ALT  --  16  ALKPHOS  --  70  BILITOT  --  0.4  GFRNONAA  --  >60  GFRAA  --  >60  ANIONGAP  --  8     Hematology Recent Labs  Lab 01/26/20 1024 01/29/20 0202  WBC 3.8 3.7*  RBC 4.02 3.29*  HGB 10.5* 8.6*  HCT 33.0* 27.7*  MCV 82.1 84.2  MCH 26.1* 26.1  MCHC 31.8* 31.0  RDW 13.6 14.0  PLT 256 222    Cardiac EnzymesNo results for input(s): TROPONINI in the last 168 hours. No results for input(s): TROPIPOC in the last 168 hours.   BNPNo results for input(s): BNP, PROBNP in the last 168 hours.   DDimer No results for input(s): DDIMER in the last 168 hours.   Radiology    CARDIAC CATHETERIZATION  Result Date: 01/28/2020 Conclusions: 1. Severe two-vessel coronary artery disease, including  70% proximal LAD stenosis involving large D1 branch, as well as heavily calcified 99% mid RCA stenosis with faint left-to-right collaterals.  There is mild diffuse disease involving the LMCA and LCx. 2. Normal left ventricular systolic function and filling pressure. Recommendations: 1. As the patient experienced angina during the procedure and was found to have significant two-vessel coronary artery disease, we will admit her for medical optimization and cardiac surgery consultation for CABG.  If [name] is not a surgical candidate, we could consider PCI to the LAD/D1, though she would need to be challenged with a P2Y12 inhibitor first given history of significant epistaxis in the past while on clopidogrel.  I do not believe that the RCA stenosis is treatable at this time from a percutaneous approach, given heavy calcification and severe tortuosity that would make  atherectomy difficult and high risk for dissection or perforation. 2. Aggressive secondary prevention.  We will check a lipid panel with AM labs to determine need for escalation of statin therapy. 3. Obtain echocardiogram. [name] Bush, MD [facility]   Telemetry    01/29/20 NSR with HR 80's - Personally Reviewed  ECG    No new tracing as of 01/29/2020- Personally Reviewed  Cardiac Studies   Cardiac catheterization 03/17/2014: Hemodynamics: AO: 209/90 mmHg LV: 206/22 mmHg LVEDP: 44 mmHg  Coronary angiography: Coronary dominance:Right   Left Main: The left coronary system is heavily calcified. The left main is short with minor irregularities.  Left Anterior Descending (LAD): Normal in size. There is 30-40% proximal tubular stenosis followed by 40% disease in the midsegment. Minor irregularities are noted distally.  1st diagonal (D1): Normal in size with 20% ostial stenosis.  2nd diagonal (D2): Medium in size with 50% ostial and proximal stenosis.  3rd diagonal (D3): Very small in size.  Circumflex (LCx): Normal in size and nondominant. The vessel has mild diffuse atherosclerosis without obstructive disease.  1st obtuse marginal: Very small in size.  2nd obtuse marginal: Very small in size.  3rd obtuse marginal: Normal in size with no significant disease.   Right Coronary Artery: Severely calcified and tortuous. There is 20-30% proximal stenosis and 40% mid stenosis. The rest of the vessel has minor irregularities.  Posterior descending artery: Normal in size with minor irregularities.  Posterior AV segment: Normal in size without significant disease.  Posterolateral branchs: PL 1 is very small in size. PL 2 is normal in size with minor irregularities.  Left ventriculography: Was not performed.  PV procedure note:: Abdominal aortogram and nonselective renal angiography was performed with a pigtail catheter in the AP position. This  showed 90% ostial stenosis in the right renal artery. It appears that the previously placed stent was 1-2 mm short of the ostium. A stent graft is noted in the aorta. It appears to be intact. The right iliac limb is known to be occluded. The patient was given 3500 of unfractionated heparin followed by another 1500 units of heparin to achieve an ACT slightly above 200. I used an IM 6 [place] guide to engage the right renal artery. Selective angiography was performed. The lesion was crossed with a [place] core wire. I then used a 5 x 15 mm balloon to predilate the lesion. I decided to place another stent in order to cover the ostium and used a 6 x 18 mm Herculink stent which was deployed to 10 atmosphere. The balloon was pulled back and inflated again at the ostium to 12 atmospheres. Final angiography showed excellent results. The catheter and wire  were removed. The site was closed with a Mynx device.  Final Conclusions:  1.Mild to moderate nonobstructive coronary artery disease with heavily calcified vessels. 2. Severely elevated blood pressure of and left ventricular end-diastolic pressure. 3. Severe ostial right renal artery stenosis. 4. Successful angioplasty and stent placement to the right renal artery.  Renal artery Doppler 06/03/2019: Summary: Largest Aortic Diameter: 2.2 cm  Renal:  Right: Normal size right kidney. Cyst(s) noted. RRV flow present. No evidence of right renal artery stenosis. Normal cortical thickness of right kidney. Patent right renal artery stent. Left: Normal size of left kidney. Normal cortical thickness of the left kidney. No evidence of left renal artery stenosis. Mesenteric: Normal Celiac artery and Superior Mesenteric artery findings. Patent IVC.  Lexiscan Myoview 10/21/2019:  Downsloping ST segment depression ST segment depression of 1 mm was noted during stress in the V4 and V5 leads, and returning to baseline after 5-9 minutes of  recovery.  Defect 1: There is a medium defect of moderate severity present in the mid inferoseptal, mid inferior, mid inferolateral and apical inferior location. There is also a considerable amount of GI uptake with soft tissue attenuation likely playing a role.  Findings consistent with possible prior myocardial infarction with a very mild degree of peri-infarct ischemia.  This is a low risk study.  Nuclear stress EF: 55%.  Patient Profile     84 y.o. female with a history of CAD (previously nonobstructive on prior cardiac catheterization in 2015) with recurrent angina, essential hypertension and renal artery stenosis who was referred by Dr. Domenic Polite for diagnostic cardiac catheterization given progressive anginal symptoms despite medical therapy.  LHC performed 01/28/2020 with severe two-vessel CAD referred to [facility] for CABG however patient has deferred at this time.   Assessment & Plan    1.  CAD with recurrent angina: -Referred for diagnostic cardiac cath due to progressive angina despite medical therapy found to have severe two-vessel coronary artery disease at which time she was referred for surgery consultation for CABG however patient has declined surgical intervention.   -Per cath note, could consider PCI to the LAD/D1, though she would need to be challenged with a P2Y12 inhibitor first given history of significant epistaxis in the past while on clopidogrel. RCA stenosis not felt to be amenable for percutaneous approach, given heavy calcification and severe tortuosity that would make atherectomy difficult and high risk for dissection or perforation.  -Continue aggressive secondary prevention with lipid panel with to determine need for escalation of statin therapy.   2.  HLD: -Lipid panel, LDL 71 -Continue current statin therapy -Increase Crestor to 10 given CAD   3.  History of renal artery stenosis status post intervention: -Right renal artery intervention 2013, 2015 -Reports  previous nosebleeds on Plavix -Continue ASA and statin   Signed, Kathyrn Drown NP-C [place] Pager: [phone] 01/29/2020, 6:50 AM    I have examined the patient and reviewed assessment and plan and discussed with patient.  Agree with above as stated.  I personally reviewed the angiogram.  I also reviewed the angiogram with the patient and family member in the room.  The RCA is severely tortuous and is not a candidate for atherectomy.  I think this is likely the cause of her angina.  She has a moderate to severe LAD lesion at a diagonal bifurcation as well, but this is not as significant as the RCA lesion.  I agree that surgery is a better option for revascularization for her.  We discussed medical therapy.  She is willing to try that.  She is also hoping to not require Plavix due to her prior bleeding issues.  That is another reason why stenting may be difficult overall, given her hesitancy with antiplatelet therapy.  Continue beta-blocker.  Imdur was increased.  Could consider adding Ranexa down the road if her anginal symptoms persisted.  Increase Crestor to 20 mg daily given her coronary artery disease.  Okay for discharge later today.  She is comfortable with this plan.    Larae Grooms    For questions or updates, please contact   Please consult [email or web address] for contact info under Cardiology/STEMI.

## 2020-01-29 NOTE — Discharge Summary (Addendum)
Discharge Summary    Patient ID: [name] MRN: [id number]; DOB: October 23, 1936  Admit date: 01/28/2020 Discharge date: 01/29/2020  Primary Care Provider: Glenda Chroman, MD  Primary Cardiologist: Rozann Lesches, MD   Discharge Diagnoses    Principal Problem:   CAD (coronary artery disease), native coronary artery Active Problems:   Essential hypertension, benign   Renal artery stenosis [facility])   Accelerating angina [facility])  Diagnostic Studies/Procedures    LHC 01/28/20:  Conclusions: 1. Severe two-vessel coronary artery disease, including 70% proximal LAD stenosis involving large D1 branch, as well as heavily calcified 99% mid RCA stenosis with faint left-to-right collaterals.  There is mild diffuse disease involving the LMCA and LCx. 2. Normal left ventricular systolic function and filling pressure.  Recommendations: 1. As the patient experienced angina during the procedure and was found to have significant two-vessel coronary artery disease, we will admit her for medical optimization and cardiac surgery consultation for CABG.  If [name] is not a surgical candidate, we could consider PCI to the LAD/D1, though she would need to be challenged with a P2Y12 inhibitor first given history of significant epistaxis in the past while on clopidogrel.  I do not believe that the RCA stenosis is treatable at this time from a percutaneous approach, given heavy calcification and severe tortuosity that would make atherectomy difficult and high risk for dissection or perforation. 2. Aggressive secondary prevention.  We will check a lipid panel with AM labs to determine need for escalation of statin therapy. 3. Obtain echocardiogram.  Dominance: Right  Intervention   History of Present Illness     [name] is a 84 y.o. female with a history of CAD (previously nonobstructive on prior cardiac catheterization in 2015) with recurrent angina, essential hypertension and renal artery stenosis  who was referred by Dr. Domenic Polite for diagnostic cardiac catheterization given progressive anginal symptoms despite medical therapy. LHC performed 01/28/2020 with severe two-vessel CAD referred to [facility] for CABG however patient has deferred at this time.   Hospital Course    Pt has presented several times in the OP setting with Dr. Domenic Polite for progressive anginal symptoms. She was referred for diagnostic LHC 01/28/20 found to have severe two vessel disease as above. Given this [facility] service were consulted for possible surgical revascularization. She was then seen by Dr. Servando Snare 01/28/20 with long discussion regarding surgery and ultimately the patient has declined proceeding with surgery at this time given her age. She has opted to pursue medical management of her symptoms.   Per Dr. Irish Lack, cath films have been reviewed at length and the RCA was found to be severely tortuous and is not a candidate for atherectomy, felt to be the cause of her angina. She has a moderate to severe LAD lesion at a diagonal bifurcation as well, but this is not as significant as the RCA lesion. Surgery still felt to be the best option based on her anatomy however given her intolerance to Plavix in the past secondary to epistaxis, stenting may be have been difficult overall, given hesitancy with antiplatelet therapy.   Medication plan:  Continue beta-blocker.  Imdur was increased.  Could consider adding Ranexa if her anginal symptoms persisted. Increase Crestor to 20 mg daily given her coronary artery disease.    Hospital problems include:  CAD with recurrent angina: -Referred for diagnostic cardiac cath due to progressive angina despite medical therapy found to have severe two-vessel coronary artery disease at which time she was referred for surgery consultation  for CABG however patient has declined surgical intervention.    -Continue aggressive secondary prevention with lipid panel with to determine need for escalation of  statin therapy.  HLD: -Lipid panel, LDL 71 -Continue current statin therapy -Increase Crestor to 20  given CAD   3.  History of renal artery stenosis status post intervention: -Right renal artery intervention 2013, 2015 -Reports previous nosebleeds on Plavix -Continue ASA and statin  Consultants: None   The patient was seen and examined by Dr. Irish Lack who feels that she is stable and ready for discharge today, 01/29/20. Cath site stable and she has remained pain free. Has ambulated in her room without issues. POst cath renal function stable at 0.80.    Did the patient have an acute coronary syndrome (MI, NSTEMI, STEMI, etc) this admission?:  No                               Did the patient have a percutaneous coronary intervention (stent / angioplasty)?:  No.   _____________  Discharge Vitals Blood pressure (!) 111/57, pulse 82, temperature 98.4 F (36.9 C), temperature source Oral, resp. rate 16, height 5' 1.5" (1.562 m), weight 50.3 kg, SpO2 97 %.  Filed Weights   01/28/20 0710 01/28/20 1351 01/29/20 0504  Weight: 55.8 kg 50.5 kg 50.3 kg   Labs & Radiologic Studies    CBC Recent Labs    01/26/20 1024 01/29/20 0202  WBC 3.8 3.7*  HGB 10.5* 8.6*  HCT 33.0* 27.7*  MCV 82.1 84.2  PLT 256 [id number]   Basic Metabolic Panel Recent Labs    01/26/20 1024 01/29/20 0202  NA 138 137  K 4.2 3.8  CL 101 103  CO2 28 26  GLUCOSE 99 117*  BUN 20 21  CREATININE 0.79 0.80  CALCIUM 10.3 9.7   Liver Function Tests Recent Labs    01/29/20 0202  AST 18  ALT 16  ALKPHOS 70  BILITOT 0.4  PROT 5.4*  ALBUMIN 3.4*   Hemoglobin A1C Recent Labs    01/29/20 0202  HGBA1C 6.5*   Fasting Lipid Panel Recent Labs    01/29/20 0202  CHOL 130  HDL 38*  LDLCALC 71  TRIG 105  CHOLHDL 3.4   Thyroid Function Tests No results for input(s): TSH, T4TOTAL, T3FREE, THYROIDAB in the last 72 hours.  Invalid input(s): FREET3 _____________  CARDIAC CATHETERIZATION  Result Date:  01/28/2020 Conclusions: 1. Severe two-vessel coronary artery disease, including 70% proximal LAD stenosis involving large D1 branch, as well as heavily calcified 99% mid RCA stenosis with faint left-to-right collaterals.  There is mild diffuse disease involving the LMCA and LCx. 2. Normal left ventricular systolic function and filling pressure. Recommendations: 1. As the patient experienced angina during the procedure and was found to have significant two-vessel coronary artery disease, we will admit her for medical optimization and cardiac surgery consultation for CABG.  If [name] is not a surgical candidate, we could consider PCI to the LAD/D1, though she would need to be challenged with a P2Y12 inhibitor first given history of significant epistaxis in the past while on clopidogrel.  I do not believe that the RCA stenosis is treatable at this time from a percutaneous approach, given heavy calcification and severe tortuosity that would make atherectomy difficult and high risk for dissection or perforation. 2. Aggressive secondary prevention.  We will check a lipid panel with AM labs to determine need for escalation of  statin therapy. 3. Obtain echocardiogram. Nelva Bush, MD [facility]   Disposition   Pt is being discharged home today in good condition.  Follow-up Plans & Appointments   Follow-up Information    [name], MD Follow up on 02/03/2020.   Specialty: Cardiology Why: 02/03/20 at 1020am Contact information: [place] [id number] [phone]          Discharge Instructions    Call MD for:  difficulty breathing, headache or visual disturbances   Complete by: As directed    Call MD for:  extreme fatigue   Complete by: As directed    Call MD for:  hives   Complete by: As directed    Call MD for:  persistant dizziness or light-headedness   Complete by: As directed    Call MD for:  persistant nausea and vomiting   Complete by: As directed     Call MD for:  redness, tenderness, or signs of infection (pain, swelling, redness, odor or green/yellow discharge around incision site)   Complete by: As directed    Call MD for:  severe uncontrolled pain   Complete by: As directed    Call MD for:  temperature >100.4   Complete by: As directed    Diet - low sodium heart healthy   Complete by: As directed    Discharge instructions   Complete by: As directed    No driving for 3-5 days. No lifting over 5 lbs for 1 week. No sexual activity for 1 week.  Keep procedure site clean & dry. If you notice increased pain, swelling, bleeding or pus, call/return!  You may shower, but no soaking baths/hot tubs/pools for 1 week.   Increase activity slowly   Complete by: As directed      Discharge Medications   Allergies as of 01/29/2020      Reactions   Cefixime Shortness Of Breath   Patient is not familiar with this listed allergy   Conjugated Estrogens Other (See Comments)   REACTION: flushing   Morphine And Related Other (See Comments)   Morphine injection Neck pain   Codeine Other (See Comments)   REACTION: dizziness   Iohexol     Desc: pt has anxiety and an irregular heartbeat. contrast exacerbates this. pt was very uncomfortable after a 20cc bolus for an miroi. we did w/o iv 11/09 per dr. Kris Hartmann. pt will talk w/ md about this and will probably not get iv contrast in the future at her re, Onset Date: [id number]   Adenosine Other (See Comments)   Elevated heart rate when doing stress test      Medication List    TAKE these medications   acetaminophen 325 MG tablet Commonly known as: TYLENOL Take 325 mg by mouth every 6 (six) hours as needed. For pain.   amLODipine 2.5 MG tablet Commonly known as: NORVASC Take 2.5 mg by mouth 2 (two) times daily.   aspirin EC 81 MG tablet Take 81 mg by mouth at bedtime.   clonazePAM 0.5 MG tablet Commonly known as: KLONOPIN Take 0.25 mg by mouth at bedtime as needed for anxiety.   cloNIDine 0.1 MG  tablet Commonly known as: CATAPRES Take 0.1 mg by mouth 2 (two) times daily as needed (SBP>170). Reported on 02/15/2016   isosorbide mononitrate 60 MG 24 hr tablet Commonly known as: IMDUR Take 1 tablet (60 mg total) by mouth at bedtime. What changed:   medication strength  how much to  take   labetalol 100 MG tablet Commonly known as: NORMODYNE Take 1 tablet (100 mg total) by mouth 2 (two) times daily. What changed: medication strength   pantoprazole 40 MG tablet Commonly known as: PROTONIX Take 40 mg by mouth daily.   polyethylene glycol powder 17 GM/SCOOP powder Commonly known as: GLYCOLAX/MIRALAX Take 17 g by mouth 2 (two) times a week. As needed   PreserVision/Lutein Caps Take 1 capsule by mouth 2 (two) times daily.   rosuvastatin 20 MG tablet Commonly known as: CRESTOR Take 1 tablet (20 mg total) by mouth at bedtime. What changed:   medication strength  how much to take  when to take this   Vitamin D3 25 MCG (1000 UT) Caps Take 1,000 Units by mouth daily.       Outstanding Labs/Studies   BMET   Duration of Discharge Encounter   Greater than 30 minutes including physician time.  Signed, Kathyrn Drown, NP 01/29/2020, 10:19 AM   I have examined the patient and reviewed assessment and plan and discussed with patient.  Agree with above as stated.  I personally reviewed the angiogram.  I also reviewed the angiogram with the patient and family member in the room.  The RCA is severely tortuous and is not a candidate for atherectomy.  I think this is likely the cause of her angina.  She has a moderate to severe LAD lesion at a diagonal bifurcation as well, but this is not as significant as the RCA lesion.  I agree that surgery is a better option for revascularization for her.  We discussed medical therapy.  She is willing to try that.  She is also hoping to not require Plavix due to her prior bleeding issues.  That is another reason why stenting may be difficult  overall, given her hesitancy with antiplatelet therapy.  Right wrist with 2+ radial pulse and no hematoma.  Continue beta-blocker.  Imdur was increased.  Could consider adding Ranexa down the road if her anginal symptoms persisted.  Increase Crestor to 20 mg daily given her coronary artery disease.  Okay for discharge later today.  She is comfortable with this plan.    Larae Grooms

## 2020-01-29 NOTE — Care Management Obs Status (Signed)
[place] NOTIFICATION   Patient Details  Name: [name] MRN: [id number] Date of Birth: [birth year]   Medicare Observation Status Notification Given:  Yes    Carles Collet, RN 01/29/2020, 10:57 AM

## 2020-01-29 NOTE — Care Management CC44 (Signed)
Condition Code 44 Documentation Completed  Patient Details  Name: CELYNA ONA MRN: [id number] Date of Birth: 1936-08-05   Condition Code 44 given:  Yes Patient signature on Condition Code 44 notice:  Yes Documentation of 2 MD's agreement:  Yes Code 44 added to claim:  Yes    Carles Collet, RN 01/29/2020, 10:57 AM

## 2020-01-29 NOTE — Plan of Care (Signed)

## 2020-02-03 ENCOUNTER — Encounter: Payer: Self-pay | Admitting: Cardiology

## 2020-02-03 ENCOUNTER — Ambulatory Visit (INDEPENDENT_AMBULATORY_CARE_PROVIDER_SITE_OTHER): Payer: Medicare Other | Admitting: Cardiology

## 2020-02-03 ENCOUNTER — Other Ambulatory Visit: Payer: Self-pay

## 2020-02-03 VITALS — BP 144/78 | HR 77 | Ht 61.5 in | Wt 114.0 lb

## 2020-02-03 DIAGNOSIS — I10 Essential (primary) hypertension: Secondary | ICD-10-CM | POA: Diagnosis not present

## 2020-02-03 DIAGNOSIS — I25119 Atherosclerotic heart disease of native coronary artery with unspecified angina pectoris: Secondary | ICD-10-CM

## 2020-02-03 DIAGNOSIS — I200 Unstable angina: Secondary | ICD-10-CM | POA: Diagnosis not present

## 2020-02-03 DIAGNOSIS — E782 Mixed hyperlipidemia: Secondary | ICD-10-CM

## 2020-02-03 NOTE — Progress Notes (Signed)
Cardiology Office Note  Date: 02/03/2020   ID: [name], [name] 06/17/1936, MRN [id number]  PCP:  Glenda Chroman, MD  Cardiologist:  Rozann Lesches, MD Electrophysiologist:  None   Chief Complaint  Patient presents with  . Cardiac follow-up    History of Present Illness: [name] is an 84 y.o. female recently assessed via telehealth encounter in mid March.  She subsequently underwent a cardiac catheterization in light of worsening angina symptoms.  Procedure was performed by Dr. Saunders Revel on March 18 and revealed two-vessel CAD involving the proximal LAD and large first diagonal branch as well as mid RCA with faint left to right collaterals.  Surgical consultation was requested as it was felt that RCA intervention would be high risk and there were also concerns about prior reported history of significant epistaxis when on Plavix (anticipated difficulty with DAPT if she were to proceed with PCI of the LAD/diagonal).  She was seen by Dr. Servando Snare, although the patient declined considering CABG.  Case was reviewed by Dr. Ed Blalock who ultimately recommended medical therapy, I see that statin was increased but no other changes made to her baseline regimen.  She is here today with her daughter for a follow-up visit.  I went over the testing results in detail and discussed implications of various treatment options.  She voiced understanding of the overall situation and asked appropriate questions.  Plan is to attempt continued medical therapy to see if this provides adequate symptom control.  We could always refer her back for further discussion with Dr. Servando Snare if this changes.  I would be reluctant to consider a PCI in her case unless she was started on Plavix well in advance to see if she were to tolerate it, and that would not necessarily exclude the possibility of bleeding at a later time after PCI.  We went over her medications today.  Past Medical History:  Diagnosis Date  . Anemia     . Anxiety   . Arthritis   . Chronic constipation   . Claudication [facility])    Chronic occlusion of the right limb of aortic stent graft.  . Diverticulosis    Left sided noted on colonoscopy 02/2006  . Ectopic atrial tachycardia ([place])   . Esophageal reflux disease   . Essential hypertension   . Hiatal hernia   . Hyperlipidemia   . Lumbar radiculopathy   . Macular degeneration   . Migraines   . Non-obstructive CAD    a. Reportedly nonobstructive at catheterization in the 1990s;  b. 03/2014 Cath: LM nl, LAD 30-40p, 64m, D1 20ost, D2 50ost/p, D3 small, LCX nl/nondominant, OM 1/2/3 nl, RCA 20-30p, 48m, PDA nl, RPL1/2  small, nl.  . Penetrating atherosclerotic ulcer of aorta (HCC)    Complex ulcerated plaque of the infrarenal abdominal aorta mild renal artery stenosis on the right side, normal left renal artery catheterization 2009, status post aortic stent graft  . Renal artery stenosis ([place])    a. Right renal artery stent April 2013 - Dr. Fletcher Anon;  b. 03/2014 PTA/stenting of RRA 2/2 ISR: 6x18 Herculink stent.  . Trigeminal neuralgia    Right  . Type 2 diabetes mellitus ([place])     Past Surgical History:  Procedure Laterality Date  . ABDOMINAL AORTAGRAM N/A 02/13/2012   Procedure: ABDOMINAL Maxcine Ham;  Surgeon: Wellington Hampshire, MD;  Location: [place] CATH LAB;  Service: Cardiovascular;  Laterality: N/A;  . ABDOMINAL AORTAGRAM N/A 03/17/2014   Procedure: ABDOMINAL AORTAGRAM;  Surgeon:  Wellington Hampshire, MD;  Location: [place] CATH LAB;  Service: Cardiovascular;  Laterality: N/A;  . ABDOMINAL HYSTERECTOMY  1982  . BREAST BIOPSY Left 1990's  . CAROTID ENDARTERECTOMY Left 1993  . Endologix powerlink bifurcated     Covered stent graft  . LEFT HEART CATH AND CORONARY ANGIOGRAPHY N/A 01/28/2020   Procedure: LEFT HEART CATH AND CORONARY ANGIOGRAPHY;  Surgeon: Nelva Bush, MD;  Location: [place] CV LAB;  Service: Cardiovascular;  Laterality: N/A;  . LEFT HEART CATHETERIZATION WITH CORONARY ANGIOGRAM N/A  03/17/2014   Procedure: LEFT HEART CATHETERIZATION WITH CORONARY ANGIOGRAM;  Surgeon: Wellington Hampshire, MD;  Location: [facility] CATH LAB;  Service: Cardiovascular;  Laterality: N/A;  . LUMBAR [place]  . PERCUTANEOUS STENT INTERVENTION Right 03/17/2014   Procedure: PERCUTANEOUS STENT INTERVENTION;  Surgeon: Wellington Hampshire, MD;  Location: [place] CATH LAB;  Service: Cardiovascular;  Laterality: Right;  Renal  . RENAL ANGIOGRAM N/A 02/13/2012   Procedure: RENAL ANGIOGRAM;  Surgeon: Wellington Hampshire, MD;  Location: [place] CATH LAB;  Service: Cardiovascular;  Laterality: N/A;  . RENAL ANGIOGRAM N/A 03/17/2014   Procedure: RENAL ANGIOGRAM;  Surgeon: Wellington Hampshire, MD;  Location: [place] CATH LAB;  Service: Cardiovascular;  Laterality: N/A;  . TONSILLECTOMY AND ADENOIDECTOMY  1947    Current Outpatient Medications  Medication Sig Dispense Refill  . acetaminophen (TYLENOL) 325 MG tablet Take 325 mg by mouth every 6 (six) hours as needed. For pain.    Marland Kitchen amLODipine (NORVASC) 2.5 MG tablet Take 2.5 mg by mouth 2 (two) times daily.    Marland Kitchen aspirin EC 81 MG tablet Take 81 mg by mouth at bedtime.     . Cholecalciferol (VITAMIN D3) 1000 UNITS CAPS Take 1,000 Units by mouth daily.     . clonazePAM (KLONOPIN) 0.5 MG tablet Take 0.25 mg by mouth at bedtime as needed for anxiety.     . cloNIDine (CATAPRES) 0.1 MG tablet Take 0.1 mg by mouth 2 (two) times daily as needed (SBP>170). Reported on 02/15/2016    . isosorbide mononitrate (IMDUR) 60 MG 24 hr tablet Take 1 tablet (60 mg total) by mouth at bedtime. 90 tablet 3  . labetalol (NORMODYNE) 100 MG tablet Take 1 tablet (100 mg total) by mouth 2 (two) times daily. 120 tablet 4  . Multiple Vitamins-Minerals (PRESERVISION/LUTEIN) CAPS Take 1 capsule by mouth 2 (two) times daily.     . pantoprazole (PROTONIX) 40 MG tablet Take 40 mg by mouth daily.    . polyethylene glycol powder (GLYCOLAX/MIRALAX) 17 GM/SCOOP powder Take 17 g by mouth 2 (two) times a week. As needed    .  rosuvastatin (CRESTOR) 20 MG tablet Take 1 tablet (20 mg total) by mouth at bedtime. 90 tablet 3   Current Facility-Administered Medications  Medication Dose Route Frequency Provider Last Rate Last Admin  . sodium chloride flush (NS) 0.9 % injection 3 mL  3 mL Intravenous Q12H [name], MD      . sodium chloride flush (NS) 0.9 % injection 3 mL  3 mL Intravenous Q12H [name], MD       Allergies:  Cefixime, Conjugated estrogens, Morphine and related, Codeine, Iohexol, and Adenosine   ROS:   No palpitations or syncope.  Physical Exam: VS:  BP (!) 144/78   Pulse 77   Ht 5' 1.5" (1.562 m)   Wt 114 lb (51.7 kg)   SpO2 98%   BMI 21.19 kg/m , BMI Body mass index is 21.19  kg/m.  Wt Readings from Last 3 Encounters:  02/03/20 114 lb (51.7 kg)  01/29/20 111 lb (50.3 kg)  01/25/20 123 lb (55.8 kg)    Visit today was spent largely in discussion. General: Patient appears comfortable at rest, wearing a mask. Examination of the arterial access site right wrist stable without hematoma, resolving ecchymosis.  ECG:  An ECG dated 01/28/2020 was personally reviewed today and demonstrated:  Normal sinus rhythm.  Recent Labwork: 01/29/2020: ALT 16; AST 18; BUN 21; Creatinine, Ser 0.80; Hemoglobin 8.6; Platelets 222; Potassium 3.8; Sodium 137     Component Value Date/Time   CHOL 130 01/29/2020 0202   TRIG 105 01/29/2020 0202   HDL 38 (L) 01/29/2020 0202   CHOLHDL 3.4 01/29/2020 0202   VLDL 21 01/29/2020 0202   LDLCALC 71 01/29/2020 0202    Other Studies Reviewed Today:  Cardiac catheterization 01/28/2020: Conclusions: 1. Severe two-vessel coronary artery disease, including 70% proximal LAD stenosis involving large D1 branch, as well as heavily calcified 99% mid RCA stenosis with faint left-to-right collaterals.  There is mild diffuse disease involving the LMCA and LCx. 2. Normal left ventricular systolic function and filling pressure.  Recommendations: 1. As the patient  experienced angina during the procedure and was found to have significant two-vessel coronary artery disease, we will admit her for medical optimization and cardiac surgery consultation for CABG.  If Ms. Gimpel is not a surgical candidate, we could consider PCI to the LAD/D1, though she would need to be challenged with a P2Y12 inhibitor first given history of significant epistaxis in the past while on clopidogrel.  I do not believe that the RCA stenosis is treatable at this time from a percutaneous approach, given heavy calcification and severe tortuosity that would make atherectomy difficult and high risk for dissection or perforation. 2. Aggressive secondary prevention.  We will check a lipid panel with AM labs to determine need for escalation of statin therapy. 3. Obtain echocardiogram.  Assessment and Plan:  1.  Two-vessel obstructive CAD, 70% proximal LAD also involving the large first diagonal and a heavily calcified 99% mid RCA stenosis with faint left-to-right collaterals.  Otherwise mild atherosclerosis.  As noted above, plan is medical therapy and observation at this point.  Continue aspirin, Norvasc, clonidine, Imdur, labetalol, and Crestor.  We can consider further up titration of therapy or possibly even addition of Ranexa depending on symptom control.  2.  Mixed hyperlipidemia, Crestor dose recently increased.  Medication Adjustments/Labs and Tests Ordered: Current medicines are reviewed at length with the patient today.  Concerns regarding medicines are outlined above.   Tests Ordered: No orders of the defined types were placed in this encounter.   Medication Changes: No orders of the defined types were placed in this encounter.   Disposition:  Follow up 1 month in the [place] office.  Signed, [name], MD, [facility] 02/03/2020 10:47 AM    [facility] [place], [place], [place] [address] Phone: [phone]; Fax: [phone]

## 2020-02-03 NOTE — Patient Instructions (Addendum)
Medication Instructions:    Your physician recommends that you continue on your current medications as directed. Please refer to the Current Medication list given to you today.  Labwork:  NONE  Testing/Procedures:  NONE  Follow-Up:  Your physician recommends that you schedule a follow-up appointment in: 1 month (office).  Any Other Special Instructions Will Be Listed Below (If Applicable).  If you need a refill on your cardiac medications before your next appointment, please call your pharmacy.

## 2020-02-09 DIAGNOSIS — I10 Essential (primary) hypertension: Secondary | ICD-10-CM | POA: Diagnosis not present

## 2020-02-16 ENCOUNTER — Ambulatory Visit (INDEPENDENT_AMBULATORY_CARE_PROVIDER_SITE_OTHER): Payer: Medicare Other | Admitting: Ophthalmology

## 2020-02-16 ENCOUNTER — Encounter (INDEPENDENT_AMBULATORY_CARE_PROVIDER_SITE_OTHER): Payer: Self-pay | Admitting: Ophthalmology

## 2020-02-16 ENCOUNTER — Other Ambulatory Visit: Payer: Self-pay

## 2020-02-16 DIAGNOSIS — H43813 Vitreous degeneration, bilateral: Secondary | ICD-10-CM

## 2020-02-16 DIAGNOSIS — Z961 Presence of intraocular lens: Secondary | ICD-10-CM | POA: Diagnosis not present

## 2020-02-16 DIAGNOSIS — H35722 Serous detachment of retinal pigment epithelium, left eye: Secondary | ICD-10-CM | POA: Diagnosis not present

## 2020-02-16 DIAGNOSIS — H353132 Nonexudative age-related macular degeneration, bilateral, intermediate dry stage: Secondary | ICD-10-CM | POA: Diagnosis not present

## 2020-02-16 NOTE — Patient Instructions (Signed)
Patient was advised to check [place] Grid daily and return immediately if changes are noted. Instructions on using the grid were given to the patient.  The nature of dry age related macular degeneration was discussed with the patient as well as its possible conversion to wet. The results of the AREDS 2 study was discussed with the patient. A diet rich in dark leafy green vegetables was advised and specific recommendations were made regarding supplements with AREDS 2 formulation . Control of hypertension and serum cholesterol may slow the disease. Smoking cessation is mandatory to slow the disease and diminish the risk of progressing to wet age related macular degeneration. The patient was instructed in the use of an [place] and was told to return immediately for any changes in the Grid. Stressed to the patient do not rub eyes

## 2020-02-19 DIAGNOSIS — I779 Disorder of arteries and arterioles, unspecified: Secondary | ICD-10-CM | POA: Diagnosis not present

## 2020-02-19 DIAGNOSIS — Z299 Encounter for prophylactic measures, unspecified: Secondary | ICD-10-CM | POA: Diagnosis not present

## 2020-02-19 DIAGNOSIS — E1165 Type 2 diabetes mellitus with hyperglycemia: Secondary | ICD-10-CM | POA: Diagnosis not present

## 2020-02-19 DIAGNOSIS — E1129 Type 2 diabetes mellitus with other diabetic kidney complication: Secondary | ICD-10-CM | POA: Diagnosis not present

## 2020-02-19 DIAGNOSIS — I25119 Atherosclerotic heart disease of native coronary artery with unspecified angina pectoris: Secondary | ICD-10-CM | POA: Diagnosis not present

## 2020-02-19 DIAGNOSIS — R809 Proteinuria, unspecified: Secondary | ICD-10-CM | POA: Diagnosis not present

## 2020-02-19 DIAGNOSIS — I10 Essential (primary) hypertension: Secondary | ICD-10-CM | POA: Diagnosis not present

## 2020-02-23 ENCOUNTER — Telehealth: Payer: Self-pay | Admitting: Cardiology

## 2020-02-23 NOTE — Telephone Encounter (Signed)
Pt was requesting to speak with Dr Domenic Polite regarding possible CABG and didn't want to wait until 4/28 appt - agreeable to virtual appt on 4/19

## 2020-02-23 NOTE — Telephone Encounter (Signed)
Patient called stating that she wants to speak with Dr. Domenic Polite in regards to having heart surgery.

## 2020-02-25 ENCOUNTER — Telehealth: Payer: Self-pay | Admitting: Cardiology

## 2020-02-25 DIAGNOSIS — Z299 Encounter for prophylactic measures, unspecified: Secondary | ICD-10-CM | POA: Diagnosis not present

## 2020-02-25 DIAGNOSIS — H6123 Impacted cerumen, bilateral: Secondary | ICD-10-CM | POA: Diagnosis not present

## 2020-02-25 DIAGNOSIS — R5383 Other fatigue: Secondary | ICD-10-CM | POA: Diagnosis not present

## 2020-02-25 DIAGNOSIS — I10 Essential (primary) hypertension: Secondary | ICD-10-CM | POA: Diagnosis not present

## 2020-02-25 DIAGNOSIS — H6120 Impacted cerumen, unspecified ear: Secondary | ICD-10-CM | POA: Diagnosis not present

## 2020-02-25 NOTE — Telephone Encounter (Signed)
Please communicate with Dr. Everrett Coombe office to see if a visit can be set up ASAP for reevaluation and plans for CABG.  If she is having progressive angina symptoms despite medical therapy, may be best for her to present to [facility] for admission through the ER and then reevaluation as an inpatient.

## 2020-02-25 NOTE — Telephone Encounter (Signed)
Patient informed and verbalized understanding of plan. 

## 2020-02-25 NOTE — Telephone Encounter (Signed)
Spoke with Thurmond Butts who agrees that patient should be seen in ED for worsening symptoms.

## 2020-02-25 NOTE — Telephone Encounter (Signed)
Reports intermittent chest pain daily rated 10/10 associated with SOB and radiates down both arms. Denies active chest pain. Says she declined heart surgery after her heart cath but is ready to have her heart surgery now. Advised that message would be sen to Dr. Domenic Polite and advised if symptoms got worse, that she needed to go to the ED for an evaluation. Verbalized understanding.

## 2020-02-25 NOTE — Telephone Encounter (Signed)
Patient informed and says her chest pain is coming more frequent. Patient advised that since she is having worsening symptoms that she needed to go to the ED for an evaluation. Verbalized understanding.

## 2020-02-25 NOTE — Telephone Encounter (Signed)
Pt c/o of Chest Pain: 1. Are you having CP right now? States that her heart continues to beat hard 2. Are you experiencing any other symptoms (ex. SOB, nausea, vomiting, sweating)? SOB- Not feeling well at all 3. How long have you been experiencing CP? 4. Is your CP continuous or coming and going?both  5. Have you taken Nitroglycerin? No

## 2020-02-25 NOTE — Telephone Encounter (Signed)
Awaiting call back from Levonne Spiller RN to discuss

## 2020-02-29 ENCOUNTER — Institutional Professional Consult (permissible substitution) (INDEPENDENT_AMBULATORY_CARE_PROVIDER_SITE_OTHER): Payer: Medicare Other | Admitting: Cardiothoracic Surgery

## 2020-02-29 ENCOUNTER — Telehealth: Payer: Medicare Other | Admitting: Cardiology

## 2020-02-29 ENCOUNTER — Other Ambulatory Visit: Payer: Self-pay | Admitting: *Deleted

## 2020-02-29 ENCOUNTER — Encounter: Payer: Self-pay | Admitting: *Deleted

## 2020-02-29 ENCOUNTER — Other Ambulatory Visit: Payer: Self-pay

## 2020-02-29 VITALS — BP 180/84 | HR 92 | Temp 97.6°F | Resp 20 | Ht 61.5 in | Wt 113.0 lb

## 2020-02-29 DIAGNOSIS — I714 Abdominal aortic aneurysm, without rupture, unspecified: Secondary | ICD-10-CM

## 2020-02-29 DIAGNOSIS — I701 Atherosclerosis of renal artery: Secondary | ICD-10-CM | POA: Diagnosis not present

## 2020-02-29 DIAGNOSIS — I25110 Atherosclerotic heart disease of native coronary artery with unstable angina pectoris: Secondary | ICD-10-CM

## 2020-02-29 DIAGNOSIS — I200 Unstable angina: Secondary | ICD-10-CM

## 2020-02-29 DIAGNOSIS — I25118 Atherosclerotic heart disease of native coronary artery with other forms of angina pectoris: Secondary | ICD-10-CM

## 2020-02-29 NOTE — Patient Instructions (Signed)
Coronary Artery Bypass Grafting  Coronary artery bypass grafting (CABG) is a surgery to bypassor to fix arteries of the heart (coronary arteries) that are narrow or blocked. This narrowing is usually the result of a buildup of fatty deposits (plaques) in the walls of the vessels. The coronary arteries supply the heart with the oxygen and nutrients that it needs to pump blood through your body. In this surgery, a section of blood vessel from another part of the body (usually the chest, arm, or leg) is removed and then placed where it will allow blood to flow around the damaged part of the coronary artery. The new section of blood vessel is called the graft. Tell a health care provider about:  Any allergies you have.  All medicines you are taking or using, including steroids, blood thinners, vitamins, herbs, eye drops, creams, and over-the-counter medicines.  Any problems you or family members have had with anesthetic medicines.  Any blood disorders you have.  Any surgeries you have had.  Any medical conditions you have.  Whether you are pregnant or may be pregnant. What are the risks? Generally, this is a safe procedure. However, problems may occur, including:  Bleeding, which may require transfusions.  Infection.  Allergic reactions to medicines or dyes.  Pain at the surgical site.  Damage to other structures or organs.  Short-term memory loss, confusion, and personality changes.  Heart rhythm problems (arrhythmias).  Stroke.  Heart attack during or after surgery.  Kidney failure. What happens before the procedure? Staying hydrated Follow instructions from your health care provider about hydration, which may include:  Up to 2 hours before the procedure - you may continue to drink clear liquids, such as water, clear fruit juice, black coffee, and plain tea.  Eating and drinking Follow instructions from your health care provider about eating and drinking, which may  include:  8 hours before the procedure - stop eating heavy meals or foods, such as meat, fried foods, or fatty foods.  6 hours before the procedure - stop eating light meals or foods, such as toast or cereal.  6 hours before the procedure - stop drinking milk or drinks that contain milk.  2 hours before the procedure - stop drinking clear liquids. Medicines  Take over-the-counter and prescription medicines only as told by your health care provider.  Ask your health care provider about: ? Changing or stopping your regular medicines. This is especially important if you are taking diabetes medicines or blood thinners. You may be asked to start new medicines and stop taking others. Do not stop medicines or adjust dosages on your own. ? Taking medicines such as aspirin and ibuprofen. These medicines can thin your blood. Do not take these medicines unless your health care provider tells you to take them. ? Taking over-the-counter medicines, vitamins, herbs, and supplements. General instructions  Ask your health care provider: ? How your surgical site will be marked or identified. ? What steps will be taken to help prevent infection. These may include:  Removing hair at the surgery site.  Washing skin with a germ-killing soap.  Taking antibiotic medicine.  You may be asked to shower with a germ-killing soap.  For 3-6 weeks before the procedure, do not use any products that contain nicotine or tobacco, such as cigarettes, e-cigarettes, and chewing tobacco. Quitting smoking is one of the best things you can do for your heart health. If you need help quitting, ask your health care provider.  Talk with your health   care provider about where the grafts will be taken from for your surgery. What happens during the procedure?  An IV will be inserted into one of your veins.  You will be given one or more of the following: ? A medicine to help you relax (sedative). ? A medicine to make you fall  asleep (general anesthetic).  An incision will be made down the front of the chest through the breastbone (sternum). The sternum will be opened so the surgeon can see the heart.  You may or may not be placed on a heart-lung bypass machine. If this machine is used, your heart will be temporarily stopped. The machine will provide oxygen to your blood while the surgeon works on your heart. If the machine is not used, it is called beating heart bypass surgery.  A section of blood vessel will be removed from another part of your body (usually the chest, arm, or leg).  The blood vessel will be attached above and below the blocked artery of your heart. This may be done on more than one artery of the heart.  When the bypass is done, you will be taken off the heart-lung machine if it was used.  If your heart was stopped, it will be restarted and will take over again normally.  Your chest will be closed with special surgical wire to hold your bones together for healing.  Your incision(s) will be closed with stitches (sutures), skin glue, or adhesive strips.  Bandages (dressings) will be placed over the incision(s).  Tubes will remain in your chest and will be connected to a suction device to help drain fluid and reinflate the lungs. The procedure may vary among health care providers and hospitals. What happens after the procedure?  Your blood pressure, heart rate, breathing rate, and blood oxygen level will be monitored until you leave the hospital.  You may wake up with a tube in your throat to help your breathing. You may be connected to a breathing machine. You will not be able to talk while the tube is in place. The tube will be taken out as soon as it is safe.  You will be groggy and may have some pain. You will be given pain medicine to help control the pain.  You may be in the intensive care unit for 1-2 days.  You may be given oxygen to help you breathe.  You will be shown how to do  deep breathing exercises.  You may have to wear compression stockings. These stockings help to prevent blood clots and reduce swelling in your legs.  You may be given new medicines to take after your surgery.  Cardiac rehabilitation will be started while you are in the hospital. This may include education and exercises to help you recover from your surgery. Summary  In this procedure, a section of blood vessel from another part of the body (usually the chest, arm, or leg) is removed and then placed where it will allow blood to bypass the narrowed or blocked part of the coronary artery.  For 3-6 weeks before the procedure, do not use any products that contain nicotine or tobacco, such as cigarettes, e-cigarettes, and chewing tobacco. If you need help quitting, ask your health care provider.  You may or may not be placed on a heart-lung bypass machine during the surgery. If used, this machine will provide oxygen to your blood while the surgeon works on your heart.  You may wake up with a tube in   your throat to help your breathing. You may be connected to a breathing machine. The tube will be taken out as soon as it is safe. This information is not intended to replace advice given to you by your health care provider. Make sure you discuss any questions you have with your health care provider. Document Revised: 07/08/2018 Document Reviewed: 07/08/2018 [employer] Patient Education  2020 [employer]   Coronary Artery Bypass Grafting, Care After This sheet gives you information about how to care for yourself after your procedure. Your health care provider may also give you more specific instructions. If you have problems or questions, contact your health care provider. What can I expect after the procedure? After the procedure, it is common to have:  Nausea.  Lack of appetite.  Constipation.  Weakness and fatigue.  Depression or irritability.  Pain or discomfort in your incision  areas. Follow these instructions at home: Medicines  Take over-the-counter and prescription medicines only as told by your health care provider. Do not stop taking medicines or start any new medicines without approval from your health care provider.  If you were prescribed an antibiotic medicine, take it as told by your health care provider. Do not stop using the antibiotic even if you start to feel better. Incision care   Follow instructions from your health care provider about how to take care of your incisions. Make sure you: ? Wash your hands with soap and water before and after you change your bandage (dressing). If soap and water are not available, use hand sanitizer. ? Change your dressing as told by your health care provider. ? Leave stitches (sutures), skin glue, or adhesive strips in place. These skin closures may need to stay in place for 2 weeks or longer. If adhesive strip edges start to loosen and curl up, you may trim the loose edges. Do not remove adhesive strips completely unless your health care provider tells you to do that.  Keep incision areas clean, dry, and protected.  Check your incision areas every day for signs of infection. Check for: ? More redness, swelling, or pain. ? More fluid or blood. ? Warmth. ? Pus or a bad smell.  If incisions were made in your legs: ? Avoid crossing your legs. ? Avoid sitting for long periods of time. Change positions every 30 minutes. ? Raise (elevate) your legs when you are sitting. Bathing  Do not take baths, swim, or use a hot tub until your health care provider approves.  Only take sponge baths. Pat the incisions dry. Do not rub incisions with a washcloth or towel.  Ask your health care provider when you can shower. Eating and drinking   Eat foods that are high in fiber, such as beans, nuts, whole grains, and raw fruits and vegetables. Meats, if eaten, should be lean cut. Avoid canned, processed, and fried foods. This  can help prevent constipation and is a recommended part of a heart-healthy diet.  Drink enough fluid to keep your urine pale yellow.  Do not drink alcohol until your recovery is complete. Ask your health care provider when it is safe to drink alcohol. Activity  Rest and limit your activity as told by your health care provider. You may be instructed to: ? Stop any activity right away if you have chest pain, shortness of breath, irregular heartbeats, or dizziness. Get help right away if you have any of these symptoms. ? Move around frequently for short periods or take short walks as told   by your health care provider. Gradually increase your activities. ? Avoid lifting, pushing, or pulling anything that is heavier than 10 lb (4.5 kg) for at least 6 weeks or as told by your health care provider.  Participate in physical therapy or a cardiac rehabilitation program as told by your health care provider. ? Physical therapy involves doing exercises to maintain movement, strengthen your muscles, and build your endurance. ? A cardiac rehabilitation program is a treatment program to improve your health and well-being through exercise training, education, and counseling.  Do not drive until your health care provider approves.  Ask your health care provider when you may return to work.  Ask your health care provider when you may resume sexual activity. General instructions  Do not drive or use heavy machinery while taking prescription pain medicine.  Do not use any products that contain nicotine or tobacco, such as cigarettes, e-cigarettes, and chewing tobacco. If you need help quitting, ask your health care provider.  Take 2-3 deep breaths every few hours during the day, while you recover. This helps expand your lungs and prevent complications like pneumonia after surgery.  If you were given a device called an incentive spirometer, use it several times a day to practice deep breathing. Support your  chest with a pillow or your arms when you take deep breaths or cough.  Wear compression stockings as told by your health care provider. These stockings help to prevent blood clots and reduce swelling in your legs.  Weigh yourself every day. This helps identify if your body is holding (retaining) fluid that may make your heart and lungs work harder.  Keep all follow-up visits as told by your health care provider. This is important. Contact a health care provider if:  You have more redness, swelling, or pain around any incision.  You have more fluid or blood coming from any incision.  Any incision feels warm to the touch.  You have pus or a bad smell coming from any incision.  You have a fever.  You have swelling in your ankles or legs.  You have pain in your legs.  You gain 2 lb (0.9 kg) or more a day.  You are nauseous or you vomit.  You have diarrhea. Get help right away if:  You have chest pain that spreads to your jaw or arms.  You are short of breath.  You have a fast or irregular heartbeat.  You notice a "clicking" in your breastbone (sternum) when you move.  You have any symptoms of a stroke. "BE FAST" is an easy way to remember the main warning signs of a stroke: ? B - Balance. Signs are dizziness, sudden trouble walking, or loss of balance. ? E - Eyes. Signs are trouble seeing or a sudden change in vision. ? F - Face. Signs are sudden weakness or numbness of the face, or the face or eyelid drooping on one side. ? A - Arms. Signs are weakness or numbness in an arm. This happens suddenly and usually on one side of the body. ? S - Speech. Signs are sudden trouble speaking, slurred speech, or trouble understanding what people say. ? T - Time. Time to call emergency services. Write down what time symptoms started.  You have other signs of a stroke, such as: ? A sudden, severe headache with no known cause. ? Nausea or vomiting. ? Seizure. These symptoms may  represent a serious problem that is an emergency. Do not wait to see if   the symptoms will go away. Get medical help right away. Call your local emergency services (911 in the [place] Do not drive yourself to the hospital. Summary  After the procedure, it is common to have pain or discomfort in the incision areas.  Do not take baths, swim, or use a hot tub until your health care provider approves.  Gradually increase your activities. You may need physical therapy or cardiac rehabilitation to help strengthen your muscles and build your endurance.  Weigh yourself every day. This helps identify if your body is holding (retaining) fluid that may make your heart and lungs work harder. This information is not intended to replace advice given to you by your health care provider. Make sure you discuss any questions you have with your health care provider. Document Revised: 07/08/2018 Document Reviewed: 07/08/2018 [employer] Patient Education  2020 [employer]   

## 2020-02-29 NOTE — Progress Notes (Signed)
[address]       [place] [address]             Keys Record [id number] Date of Birth: 1935/12/26  Referring: [name], MD Primary Care: Glenda Chroman, MD Primary Cardiologist: Rozann Lesches, MD  Chief Complaint:    Chief Complaint  Patient presents with  . Coronary Artery Disease    visit re-consult for surgery...CATH 01/28/20...ECHO NEVER DONE...advised to go to the ED 02/25/20...BUT DID NOT GO.    History of Present Illness:    [name] 84 y.o. female is seen in the office  today for after she had called the cardiology office because of increasing chest pain.  The patient was recently seen in March 2021 to consider coronary artery bypass grafting for severe two-vessel coronary artery disease, with a highly calcified complex tortuous right coronary artery that was not felt suitable for stenting or atherectomy.  Previously discussed with the patient the option of coronary artery bypass grafting on her hospitalization in March, at that time she did not wish to proceed with coronary artery bypass graft.  Since home she notes continued anginal chest pain and low levels of activity, his usual activity around the house.  Patient has a PMH of HTN, Hyperlipidemia, Penetrating, atherosclerotic Ulcer of Abdominal Aorta with stent graft, history of Renal artery stenosis with stents, H/O left carotid endarterectomy, claudication, and Ectopic Atrial Tachycardia.  The patient presented with a 6 month history of chest pain in march 2021.    At that time her symptoms occurred with minimal exertion and usually relieves with rest.  She states that she currently has not been active because she cant even walk to the mailbox without developing chest pain and shortness of breath.  She is unable to clean her house without stopping multiple times.    She had a stress test in December 2020 which showed a likely  prior MI and a mild degree of peri-infarct ischemia.  Her EF was preserved. She was evaluated by Dr. Domenic Polite who recommended cardiac catheterization in January 2021, however the patient initially declined and wished to speak with her daughter.  They spoke again via virtual visit in February at which time her symptoms persisted despite adjustment of medications.    Her cath w by Dr. Saunders Revel in March  and showed 2 vessel CAD.  I      Current Activity/ Functional Status:  Patient is independent with mobility/ambulation, transfers, ADL's, IADL's.   Zubrod Score: At the time of surgery this patient's most appropriate activity status/level should be described as: []     0    Normal activity, no symptoms [x]     1    Restricted in physical strenuous activity but ambulatory, able to do out light work []     2    Ambulatory and capable of self care, unable to do work activities, up and about               >50 % of waking hours                              []     3    Only limited self care, in bed greater than 50% of waking hours []   4    Completely disabled, no self care, confined to bed or chair []     5    Moribund   Past Medical History:  Diagnosis Date  . Anemia   . Anxiety   . Arthritis   . Chronic constipation   . Claudication [facility])    Chronic occlusion of the right limb of aortic stent graft.  . Diverticulosis    Left sided noted on colonoscopy 02/2006  . Ectopic atrial tachycardia ([place])   . Esophageal reflux disease   . Essential hypertension   . Hiatal hernia   . Hyperlipidemia   . Lumbar radiculopathy   . Macular degeneration   . Migraines   . Non-obstructive CAD    a. Reportedly nonobstructive at catheterization in the 1990s;  b. 03/2014 Cath: LM nl, LAD 30-40p, 84m, D1 20ost, D2 50ost/p, D3 small, LCX nl/nondominant, OM 1/2/3 nl, RCA 20-30p, 66m, PDA nl, RPL1/2  small, nl.  . Penetrating atherosclerotic ulcer of aorta (HCC)    Complex ulcerated plaque of the infrarenal abdominal  aorta mild renal artery stenosis on the right side, normal left renal artery catheterization 2009, status post aortic stent graft  . Renal artery stenosis ([place])    a. Right renal artery stent April 2013 - Dr. Fletcher Anon;  b. 03/2014 PTA/stenting of RRA 2/2 ISR: 6x18 Herculink stent.  . Trigeminal neuralgia    Right  . Type 2 diabetes mellitus ([place])     Past Surgical History:  Procedure Laterality Date  . ABDOMINAL AORTAGRAM N/A 02/13/2012   Procedure: ABDOMINAL Maxcine Ham;  Surgeon: Wellington Hampshire, MD;  Location: [facility] CATH LAB;  Service: Cardiovascular;  Laterality: N/A;  . ABDOMINAL AORTAGRAM N/A 03/17/2014   Procedure: ABDOMINAL Maxcine Ham;  Surgeon: Wellington Hampshire, MD;  Location: [place] CATH LAB;  Service: Cardiovascular;  Laterality: N/A;  . ABDOMINAL HYSTERECTOMY  1982  . BREAST BIOPSY Left 1990's  . CAROTID ENDARTERECTOMY Left 1993  . Endologix powerlink bifurcated     Covered stent graft  . LEFT HEART CATH AND CORONARY ANGIOGRAPHY N/A 01/28/2020   Procedure: LEFT HEART CATH AND CORONARY ANGIOGRAPHY;  Surgeon: Nelva Bush, MD;  Location: [facility] CV LAB;  Service: Cardiovascular;  Laterality: N/A;  . LEFT HEART CATHETERIZATION WITH CORONARY ANGIOGRAM N/A 03/17/2014   Procedure: LEFT HEART CATHETERIZATION WITH CORONARY ANGIOGRAM;  Surgeon: Wellington Hampshire, MD;  Location: [facility] CATH LAB;  Service: Cardiovascular;  Laterality: N/A;  . LUMBAR [place]  . PERCUTANEOUS STENT INTERVENTION Right 03/17/2014   Procedure: PERCUTANEOUS STENT INTERVENTION;  Surgeon: Wellington Hampshire, MD;  Location: [facility] CATH LAB;  Service: Cardiovascular;  Laterality: Right;  Renal  . RENAL ANGIOGRAM N/A 02/13/2012   Procedure: RENAL ANGIOGRAM;  Surgeon: Wellington Hampshire, MD;  Location: [facility] CATH LAB;  Service: Cardiovascular;  Laterality: N/A;  . RENAL ANGIOGRAM N/A 03/17/2014   Procedure: RENAL ANGIOGRAM;  Surgeon: Wellington Hampshire, MD;  Location: [facility] CATH LAB;  Service: Cardiovascular;  Laterality: N/A;  .  TONSILLECTOMY AND ADENOIDECTOMY  1947    Family History  Problem Relation Age of Onset  . Heart failure Mother   . Coronary artery disease Mother   . Heart disease Mother   . Hypertension Mother   . Stroke Mother   . Heart attack Father   . Heart disease Father   . Hypertension Father   . Heart disease Brother   . Hypertension Sister   . Parkinson's disease Sister   . Kidney disease Sister   .  Stroke Sister   . Hypertension Brother   . Stroke Sister   . Hypertension Sister   . Breast cancer Neg Hx      Social History   Tobacco Use  Smoking Status Former Smoker  . Packs/day: 0.20  . Years: 1.00  . Pack years: 0.20  . Types: Cigarettes  . Quit date: 11/13/1979  . Years since quitting: 40.3  Smokeless Tobacco Never Used    Social History   Substance and Sexual Activity  Alcohol Use No  . Alcohol/week: 0.0 standard drinks     Allergies  Allergen Reactions  . Cefixime Shortness Of Breath    Patient is not familiar with this listed allergy  . Conjugated Estrogens Other (See Comments)    REACTION: flushing  . Morphine And Related Other (See Comments)    Morphine injection Neck pain  . Codeine Other (See Comments)    REACTION: dizziness  . Iohexol      Desc: pt has anxiety and an irregular heartbeat. contrast exacerbates this. pt was very uncomfortable after a 20cc bolus for an miroi. we did w/o iv 11/09 per dr. Kris Hartmann. pt will talk w/ md about this and will probably not get iv contrast in the future at her re, Onset Date: [id number]   . Adenosine Other (See Comments)    Elevated heart rate when doing stress test    Current Outpatient Medications  Medication Sig Dispense Refill  . acetaminophen (TYLENOL) 325 MG tablet Take 325 mg by mouth every 6 (six) hours as needed. For pain.    Marland Kitchen amLODipine (NORVASC) 2.5 MG tablet Take 2.5 mg by mouth 2 (two) times daily.    Marland Kitchen aspirin EC 81 MG tablet Take 81 mg by mouth at bedtime.     . Cholecalciferol (VITAMIN D3) 1000  UNITS CAPS Take 1,000 Units by mouth daily.     . clonazePAM (KLONOPIN) 0.5 MG tablet Take 0.25 mg by mouth at bedtime as needed for anxiety.     . cloNIDine (CATAPRES) 0.1 MG tablet Take 0.1 mg by mouth 2 (two) times daily as needed (SBP>170). Reported on 02/15/2016    . isosorbide mononitrate (IMDUR) 60 MG 24 hr tablet Take 1 tablet (60 mg total) by mouth at bedtime. 90 tablet 3  . labetalol (NORMODYNE) 100 MG tablet Take 1 tablet (100 mg total) by mouth 2 (two) times daily. 120 tablet 4  . Multiple Vitamins-Minerals (PRESERVISION/LUTEIN) CAPS Take 1 capsule by mouth 2 (two) times daily.     . pantoprazole (PROTONIX) 40 MG tablet Take 40 mg by mouth daily.    . polyethylene glycol powder (GLYCOLAX/MIRALAX) 17 GM/SCOOP powder Take 17 g by mouth 2 (two) times a week. As needed    . rosuvastatin (CRESTOR) 20 MG tablet Take 1 tablet (20 mg total) by mouth at bedtime. 90 tablet 3   Current Facility-Administered Medications  Medication Dose Route Frequency Provider Last Rate Last Admin  . sodium chloride flush (NS) 0.9 % injection 3 mL  3 mL Intravenous Q12H [name], MD      . sodium chloride flush (NS) 0.9 % injection 3 mL  3 mL Intravenous Q12H [name], MD          Review of Systems:    Cardiac Review of Systems: Y or  [    ]= no             Chest Pain [ Y   ]  Resting SOB [   ]         Exertional SOB  [Y  ]  Orthopnea [  ]             Pedal Edema [   ]        Palpitations Aqua.Slicker  ]         Syncope  [  ]    Presyncope [   ]             General Review of Systems: [Y] = yes [  ]=no Constitional: recent weight change [  ]; anorexia [  ]; fatigue [Y  ]; nausea [N]; night sweats [  ]; fever Aqua.Slicker  ]; or chills [  ]                                                               Dental: Last Dentist visit:              Eye : blurred vision [  ]; diplopia [   ]; vision changes [  ];  Amaurosis fugax[  ]; Resp: cough [  ];  wheezing[  ];  hemoptysis[  ]; shortness of breath[ Y ];  paroxysmal nocturnal dyspnea[  ]; dyspnea on exertion[Y  ]; or orthopnea[  ];  GI:  gallstones[  ], vomiting[  ];  dysphagia[  ]; melena[  ];  hematochezia [  ]; heartburn[  ];   Hx of  Colonoscopy[  ]; GU: kidney stones [  ]; hematuria[  ];   dysuria [Y, h/o cystocele  ];  nocturia[  ];  history of     obstruction [  ]; urinary frequency [  ]             Skin: rash, swelling[  ];, hair loss[  ];  peripheral edema[N  ];  or itching[  ]; Musculosketetal: myalgias[  ];  joint swelling[  ];  joint erythema[  ];  joint pain[  ];  back pain[  ];             Heme/Lymph: bruising[  ];  bleeding[  ];  anemia[  ];  Neuro: TIA[  ];  headaches[  ];  Stroke[Y. Patient states identified on scan?  ];  vertigo[  ];  seizures[  ];   paresthesias[  ];  difficulty walking[  ];             Psych:depression[  ]; anxiety[  ];             Endocrine: diabetes[ N ];  thyroid dysfunction[N  ];         PHYSICAL EXAMINATION: BP (!) 180/84   Pulse 92   Temp 97.6 F (36.4 C) (Skin)   Resp 20   Ht 5' 1.5" (1.562 m)   Wt 113 lb (51.3 kg)   SpO2 98% Comment: RA  BMI 21.01 kg/m  General appearance: alert, cooperative, appears stated age and no distress Neck: no adenopathy, no carotid bruit, no JVD, supple, symmetrical, trachea midline and thyroid not enlarged, symmetric, no tenderness/mass/nodules Lymph nodes: Cervical, supraclavicular, and axillary nodes normal. Resp: clear to auscultation bilaterally Cardio: regular rate and rhythm, S1, S2 normal, no murmur, click, rub or gallop GI: soft, non-tender; bowel sounds  normal; no masses,  no organomegaly Extremities: extremities normal, atraumatic, no cyanosis or edema and Homans sign is negative, no sign of DVT Neurologic: Grossly normal  Diagnostic Studies & Laboratory data:     Recent Radiology Findings:  10/2019   Downsloping ST segment depression ST segment depression of 1 mm was noted during stress in the V4 and V5 leads, and returning to baseline after 5-9  minutes of recovery.  Defect 1: There is a medium defect of moderate severity present in the mid inferoseptal, mid inferior, mid inferolateral and apical inferior location. There is also a considerable amount of GI uptake with soft tissue attenuation likely playing a role.  Findings consistent with possible prior myocardial infarction with a very mild degree of peri-infarct ischemia.  This is a low risk study.  Nuclear stress EF: 55%.      I have independently reviewed the above radiology studies  and reviewed the findings with the patient.   Recent Lab Findings: Lab Results  Component Value Date   WBC 3.7 (L) 01/29/2020   HGB 8.6 (L) 01/29/2020   HCT 27.7 (L) 01/29/2020   PLT 222 01/29/2020   GLUCOSE 117 (H) 01/29/2020   CHOL 130 01/29/2020   TRIG 105 01/29/2020   HDL 38 (L) 01/29/2020   LDLCALC 71 01/29/2020   ALT 16 01/29/2020   AST 18 01/29/2020   NA 137 01/29/2020   K 3.8 01/29/2020   CL 103 01/29/2020   CREATININE 0.80 01/29/2020   BUN 21 01/29/2020   CO2 26 01/29/2020   TSH 1.644 Test methodology is 3rd generation TSH 12/20/2007   INR 0.86 03/17/2014   HGBA1C 6.5 (H) 01/29/2020   CATH 01/2020 :  Conclusions: 1. Severe two-vessel coronary artery disease, including 70% proximal LAD stenosis involving large D1 branch, as well as heavily calcified 99% mid RCA stenosis with faint left-to-right collaterals.  There is mild diffuse disease involving the LMCA and LCx. 2. Normal left ventricular systolic function and filling pressure.  Recommendations: 1. As the patient experienced angina during the procedure and was found to have significant two-vessel coronary artery disease, we will admit her for medical optimization and cardiac surgery consultation for CABG.  If [name] is not a surgical candidate, we could consider PCI to the LAD/D1, though she would need to be challenged with a P2Y12 inhibitor first given history of significant epistaxis in the past while on  clopidogrel.  I do not believe that the RCA stenosis is treatable at this time from a percutaneous approach, given heavy calcification and severe tortuosity that would make atherectomy difficult and high risk for dissection or perforation. 2. Aggressive secondary prevention.  We will check a lipid panel with AM labs to determine need for escalation of statin therapy. 3. Obtain echocardiogram.  Nelva Bush, MD [facility]   I have independently reviewed the above  cath films and reviewed the findings with the  patient .  Assessment / Plan:   #1 anginal chest pain significantly limiting the patient's activity, with high-grade right coronary artery stenosis and disease in the LAD diagonal-films have been reviewed by Dr. END , Dr. Irish Lack, and Dr. Burt Knack all agreed that atherectomy of the right coronary artery would be a very significant risk.  With the patient's ongoing chest pain with medical therapy I recommended to her that we proceed this week with coronary artery bypass grafting.  Risks and options were discussed with the patient and her daughter, both the risk of trying to continue with  medical therapy and the risk of surgery.  The patient is willing to proceed with coronary artery bypass grafting.  Arrangements have been made in the office today to get preop labs Covid tests echocardiogram tomorrow and proceed with coronary artery bypass grafting on Wednesday morning.  #2 anemia with hemoglobin of 8.8 at [facility] internal medicine last week.-No obvious source of blood loss  #3 history of peripheral vascular disease and renal artery disease  #4 longstanding hypertension   Grace Isaac MD      [address] [place] [address] Office [phone]     02/29/2020 3:30 PM

## 2020-03-01 ENCOUNTER — Other Ambulatory Visit (HOSPITAL_COMMUNITY)
Admission: RE | Admit: 2020-03-01 | Discharge: 2020-03-01 | Disposition: A | Discharge status: Home / Self Care | Payer: Medicare Other | Source: Ambulatory Visit | Attending: Cardiothoracic Surgery | Admitting: Cardiothoracic Surgery

## 2020-03-01 ENCOUNTER — Encounter (HOSPITAL_COMMUNITY): Payer: Self-pay

## 2020-03-01 ENCOUNTER — Other Ambulatory Visit: Payer: Self-pay

## 2020-03-01 ENCOUNTER — Ambulatory Visit (HOSPITAL_BASED_OUTPATIENT_CLINIC_OR_DEPARTMENT_OTHER)
Admission: RE | Admit: 2020-03-01 | Discharge: 2020-03-01 | Disposition: A | Discharge status: Home / Self Care | Payer: Medicare Other | Source: Ambulatory Visit | Attending: Cardiothoracic Surgery | Admitting: Cardiothoracic Surgery

## 2020-03-01 ENCOUNTER — Encounter (HOSPITAL_COMMUNITY)
Admission: RE | Admit: 2020-03-01 | Discharge: 2020-03-01 | Disposition: A | Discharge status: Home / Self Care | Payer: Medicare Other | Source: Ambulatory Visit | Attending: Cardiothoracic Surgery | Admitting: Cardiothoracic Surgery

## 2020-03-01 ENCOUNTER — Ambulatory Visit (HOSPITAL_COMMUNITY)
Admission: RE | Admit: 2020-03-01 | Discharge: 2020-03-01 | Disposition: A | Discharge status: Home / Self Care | Payer: Medicare Other | Source: Ambulatory Visit | Attending: Cardiothoracic Surgery | Admitting: Cardiothoracic Surgery

## 2020-03-01 DIAGNOSIS — I471 Supraventricular tachycardia: Secondary | ICD-10-CM | POA: Insufficient documentation

## 2020-03-01 DIAGNOSIS — I25118 Atherosclerotic heart disease of native coronary artery with other forms of angina pectoris: Secondary | ICD-10-CM

## 2020-03-01 DIAGNOSIS — I4891 Unspecified atrial fibrillation: Secondary | ICD-10-CM | POA: Diagnosis not present

## 2020-03-01 DIAGNOSIS — D62 Acute posthemorrhagic anemia: Secondary | ICD-10-CM | POA: Diagnosis not present

## 2020-03-01 DIAGNOSIS — I25110 Atherosclerotic heart disease of native coronary artery with unstable angina pectoris: Secondary | ICD-10-CM | POA: Diagnosis not present

## 2020-03-01 DIAGNOSIS — E785 Hyperlipidemia, unspecified: Secondary | ICD-10-CM | POA: Diagnosis not present

## 2020-03-01 DIAGNOSIS — I4892 Unspecified atrial flutter: Secondary | ICD-10-CM | POA: Diagnosis not present

## 2020-03-01 DIAGNOSIS — M25511 Pain in right shoulder: Secondary | ICD-10-CM | POA: Diagnosis not present

## 2020-03-01 DIAGNOSIS — I252 Old myocardial infarction: Secondary | ICD-10-CM | POA: Diagnosis not present

## 2020-03-01 DIAGNOSIS — Z01818 Encounter for other preprocedural examination: Secondary | ICD-10-CM | POA: Insufficient documentation

## 2020-03-01 DIAGNOSIS — Z8673 Personal history of transient ischemic attack (TIA), and cerebral infarction without residual deficits: Secondary | ICD-10-CM | POA: Insufficient documentation

## 2020-03-01 DIAGNOSIS — I10 Essential (primary) hypertension: Secondary | ICD-10-CM | POA: Insufficient documentation

## 2020-03-01 DIAGNOSIS — E1151 Type 2 diabetes mellitus with diabetic peripheral angiopathy without gangrene: Secondary | ICD-10-CM | POA: Diagnosis not present

## 2020-03-01 DIAGNOSIS — Z20822 Contact with and (suspected) exposure to covid-19: Secondary | ICD-10-CM | POA: Insufficient documentation

## 2020-03-01 DIAGNOSIS — I517 Cardiomegaly: Secondary | ICD-10-CM | POA: Insufficient documentation

## 2020-03-01 DIAGNOSIS — E119 Type 2 diabetes mellitus without complications: Secondary | ICD-10-CM | POA: Insufficient documentation

## 2020-03-01 DIAGNOSIS — I2510 Atherosclerotic heart disease of native coronary artery without angina pectoris: Secondary | ICD-10-CM | POA: Insufficient documentation

## 2020-03-01 DIAGNOSIS — I700 Atherosclerosis of aorta: Secondary | ICD-10-CM | POA: Diagnosis not present

## 2020-03-01 DIAGNOSIS — M1990 Unspecified osteoarthritis, unspecified site: Secondary | ICD-10-CM | POA: Diagnosis not present

## 2020-03-01 DIAGNOSIS — Z951 Presence of aortocoronary bypass graft: Secondary | ICD-10-CM | POA: Insufficient documentation

## 2020-03-01 DIAGNOSIS — I358 Other nonrheumatic aortic valve disorders: Secondary | ICD-10-CM | POA: Diagnosis not present

## 2020-03-01 LAB — ECHOCARDIOGRAM COMPLETE
Height: 61.5 in
Weight: 1779.2 oz

## 2020-03-01 LAB — COMPREHENSIVE METABOLIC PANEL
ALT: 14 U/L (ref 0–44)
AST: 18 U/L (ref 15–41)
Albumin: 4 g/dL (ref 3.5–5.0)
Alkaline Phosphatase: 91 U/L (ref 38–126)
Anion gap: 8 (ref 5–15)
BUN: 17 mg/dL (ref 8–23)
CO2: 23 mmol/L (ref 22–32)
Calcium: 9.8 mg/dL (ref 8.9–10.3)
Chloride: 105 mmol/L (ref 98–111)
Creatinine, Ser: 0.75 mg/dL (ref 0.44–1.00)
GFR calc Af Amer: >60 mL/min (ref >60)
GFR calc non Af Amer: >60 mL/min (ref >60)
Glucose, Bld: 166 mg/dL — ABNORMAL HIGH (ref 70–99)
Potassium: 4 mmol/L (ref 3.5–5.1)
Sodium: 136 mmol/L (ref 135–145)
Total Bilirubin: 0.6 mg/dL (ref 0.3–1.2)
Total Protein: 6.4 g/dL — ABNORMAL LOW (ref 6.5–8.1)

## 2020-03-01 LAB — URINALYSIS, ROUTINE W REFLEX MICROSCOPIC
Bacteria, UA: NONE SEEN
Bilirubin Urine: NEGATIVE
Glucose, UA: NEGATIVE mg/dL
Hgb urine dipstick: NEGATIVE
Ketones, ur: NEGATIVE mg/dL
Nitrite: NEGATIVE
Protein, ur: NEGATIVE mg/dL
Specific Gravity, Urine: 1.006 (ref 1.005–1.030)
pH: 5 (ref 5.0–8.0)

## 2020-03-01 LAB — BLOOD GAS, ARTERIAL
Acid-Base Excess: 0.4 mmol/L (ref 0.0–2.0)
Bicarbonate: 24.6 mmol/L (ref 20.0–28.0)
Drawn by: 421801
FIO2: 21
O2 Saturation: 98.4 %
Patient temperature: 37
pCO2 arterial: 40.4 mmHg (ref 32.0–48.0)
pH, Arterial: 7.401 (ref 7.350–7.450)
pO2, Arterial: 101 mmHg (ref 83.0–108.0)

## 2020-03-01 LAB — SURGICAL PCR SCREEN
MRSA, PCR: NEGATIVE
Staphylococcus aureus: POSITIVE — AB

## 2020-03-01 LAB — CBC
HCT: 28.3 % — ABNORMAL LOW (ref 36.0–46.0)
Hemoglobin: 8.5 g/dL — ABNORMAL LOW (ref 12.0–15.0)
MCH: 24.1 pg — ABNORMAL LOW (ref 26.0–34.0)
MCHC: 30 g/dL (ref 30.0–36.0)
MCV: 80.2 fL (ref 80.0–100.0)
Platelets: 278 10*3/uL (ref 150–400)
RBC: 3.53 MIL/uL — ABNORMAL LOW (ref 3.87–5.11)
RDW: 14.3 % (ref 11.5–15.5)
WBC: 4.2 10*3/uL (ref 4.0–10.5)
nRBC: 0 % (ref 0.0–0.2)

## 2020-03-01 LAB — APTT: aPTT: 55 seconds — ABNORMAL HIGH (ref 24–36)

## 2020-03-01 LAB — PROTIME-INR
INR: 1 (ref 0.8–1.2)
Prothrombin Time: 13.2 seconds (ref 11.4–15.2)

## 2020-03-01 LAB — HEMOGLOBIN A1C
Hgb A1c MFr Bld: 6.4 % — ABNORMAL HIGH (ref 4.8–5.6)
Mean Plasma Glucose: 136.98 mg/dL

## 2020-03-01 LAB — SARS CORONAVIRUS 2 (TAT 6-24 HRS): SARS Coronavirus 2: NEGATIVE

## 2020-03-01 MED ORDER — NITROGLYCERIN IN D5W 200-5 MCG/ML-% IV SOLN
2.0000 ug/min | INTRAVENOUS | Status: AC
  Administered 2020-03-02: 09:00:00 15 ug/min via INTRAVENOUS
  Filled 2020-03-01: qty 250

## 2020-03-01 MED ORDER — MILRINONE LACTATE IN DEXTROSE 20-5 MG/100ML-% IV SOLN
0.3000 ug/kg/min | INTRAVENOUS | Status: DC
  Filled 2020-03-01: qty 100

## 2020-03-01 MED ORDER — TRANEXAMIC ACID (OHS) BOLUS VIA INFUSION
15.0000 mg/kg | INTRAVENOUS | Status: AC
  Administered 2020-03-02: 09:00:00 769.5 mg via INTRAVENOUS
  Filled 2020-03-01: qty 770

## 2020-03-01 MED ORDER — TRANEXAMIC ACID 1000 MG/10ML IV SOLN
1.5000 mg/kg/h | INTRAVENOUS | Status: AC
  Administered 2020-03-02: 10:00:00 1.5 mg/kg/h via INTRAVENOUS
  Filled 2020-03-01: qty 25

## 2020-03-01 MED ORDER — EPINEPHRINE HCL 5 MG/250ML IV SOLN IN NS
0.0000 ug/min | INTRAVENOUS | Status: DC
  Filled 2020-03-01: qty 250

## 2020-03-01 MED ORDER — SODIUM CHLORIDE 0.9 % IV SOLN
INTRAVENOUS | Status: DC
  Filled 2020-03-01: qty 30

## 2020-03-01 MED ORDER — LEVOFLOXACIN IN D5W 500 MG/100ML IV SOLN
500.0000 mg | INTRAVENOUS | Status: AC
  Administered 2020-03-02: 09:00:00 500 mg via INTRAVENOUS
  Filled 2020-03-01: qty 100

## 2020-03-01 MED ORDER — VANCOMYCIN HCL IN DEXTROSE 1-5 GM/200ML-% IV SOLN
1000.0000 mg | INTRAVENOUS | Status: AC
  Administered 2020-03-02: 1000 mg via INTRAVENOUS
  Filled 2020-03-01 (×2): qty 200

## 2020-03-01 MED ORDER — DEXMEDETOMIDINE HCL IN NACL 400 MCG/100ML IV SOLN
0.1000 ug/kg/h | INTRAVENOUS | Status: AC
  Administered 2020-03-02: 10:00:00 .4 ug/kg/h via INTRAVENOUS
  Filled 2020-03-01: qty 100

## 2020-03-01 MED ORDER — INSULIN REGULAR(HUMAN) IN NACL 100-0.9 UT/100ML-% IV SOLN
INTRAVENOUS | Status: AC
  Administered 2020-03-02: 09:00:00 4.2 [IU]/h via INTRAVENOUS
  Filled 2020-03-01: qty 100

## 2020-03-01 MED ORDER — TRANEXAMIC ACID (OHS) PUMP PRIME SOLUTION
2.0000 mg/kg | INTRAVENOUS | Status: DC
  Filled 2020-03-01: qty 1.03

## 2020-03-01 MED ORDER — MAGNESIUM SULFATE 50 % IJ SOLN
40.0000 meq | INTRAMUSCULAR | Status: DC
  Filled 2020-03-01: qty 9.85

## 2020-03-01 MED ORDER — NOREPINEPHRINE 4 MG/250ML-% IV SOLN
0.0000 ug/min | INTRAVENOUS | Status: DC
  Filled 2020-03-01: qty 250

## 2020-03-01 MED ORDER — PHENYLEPHRINE HCL-NACL 20-0.9 MG/250ML-% IV SOLN
30.0000 ug/min | INTRAVENOUS | Status: DC
  Filled 2020-03-01: qty 250

## 2020-03-01 MED ORDER — PLASMA-LYTE 148 IV SOLN
INTRAVENOUS | Status: DC
  Filled 2020-03-01: qty 2.5

## 2020-03-01 MED ORDER — POTASSIUM CHLORIDE 2 MEQ/ML IV SOLN
80.0000 meq | INTRAVENOUS | Status: DC
  Filled 2020-03-01: qty 40

## 2020-03-01 NOTE — Pre-Procedure Instructions (Signed)
[name]  03/01/2020    Your procedure is scheduled on Wednesday, April 21.  Report to [facility], Main Entrance or Entrance "A" at 6:30 AM                    Your surgery or procedure is scheduled for 10:00 A.M.   Call this number if you have problems the morning of surgery: [phone]  This is the number for the Pre- Surgical Desk.    Remember:  Do not eat or drink after midnight.  Take these medicines the morning of surgery with A SIP OF WATER:  amLODipine (NORVASC)             labetalol (NORMODYNE)              pantoprazole (PROTONIX)  Take if needed: acetaminophen (TYLENOL)      cloNIDine (CATAPRES)  Follow Dr. Lurline Del instructions regarding Aspirin    STOP taking Aspirin Products (Goody Powder, Excedrin Migraine), Ibuprofen (Advil), Naproxen (Aleve), Vitamins and Herbal Products (ie Fish Oil).   [facility]- Preparing For Surgery  Before surgery, you can play an important role. Because skin is not sterile, your skin needs to be as free of germs as possible. You can reduce the number of germs on your skin by washing with CHG (chlorahexidine gluconate) Soap before surgery.  CHG is an antiseptic cleaner which kills germs and bonds with the skin to continue killing germs even after washing.    Oral Hygiene is also important to reduce your risk of infection.  Remember - BRUSH YOUR TEETH THE MORNING OF SURGERY WITH YOUR REGULAR TOOTHPASTE  Please do not use if you have an allergy to CHG or antibacterial soaps. If your skin becomes reddened/irritated stop using the CHG.  Do not shave (including legs and underarms) for at least 48 hours prior to first CHG shower. It is OK to shave your face.  Please follow these instructions carefully.   1. Shower the NIGHT BEFORE SURGERY and the MORNING OF SURGERY with CHG.   2. If you chose to wash your hair, wash your hair first as usual with your normal shampoo.  3. After you shampoo, wash your face and private  area with the soap you use at home, then rinse your hair and body thoroughly to remove the shampoo and soap.  4. Use CHG as you would any other liquid soap. You can apply CHG directly to the skin and wash gently with a scrungie or a clean washcloth.   5. Apply the CHG Soap to your body ONLY FROM THE NECK DOWN.  Do not use on open wounds or open sores. Avoid contact with your eyes, ears, mouth and genitals (private parts).     6. Wash thoroughly, paying special attention to the area where your surgery will be performed.  7. Thoroughly rinse your body with warm water from the neck down.  8. DO NOT shower/wash with your normal soap after using and rinsing off the CHG Soap.  9. Pat yourself dry with a CLEAN TOWEL.  10. Wear CLEAN PAJAMAS to bed the night before surgery, wear comfortable clothes the morning of surgery  11. Place CLEAN SHEETS on your bed the night of your first shower and DO NOT SLEEP WITH PETS.  Day of Surgery: Shower as instructed above. Do not wear lotions, powders, or perfumes, or deodorant. Please wear clean clothes to the hospital/surgery center.   Remember to brush your teeth WITH  YOUR REGULAR TOOTHPASTE.  Do not wear jewelry, make-up or nail polish.  Do not wear lotions, powders, or perfumes, or deodorant.  Do not shave 48 hours prior to surgery.  Men may shave face and neck.  Do not bring valuables to the hospital.  [facility] is not responsible for any belongings or valuables.  Contacts, dentures or bridgework may not be worn into surgery.  Leave your suitcase in the car.  After surgery it may be brought to your room.  For patients admitted to the hospital, discharge time will be determined by your treatment team.  Patients discharged the day of surgery will not be allowed to drive home.    Please read over the following fact sheets that you were given. Coughing and Deep Breathing, Pain Booklet, Patient Instructions for Mupirocin Application and Surgical  Site Infections

## 2020-03-01 NOTE — Progress Notes (Signed)
  Echocardiogram 2D Echocardiogram has been performed.  [name] 03/01/2020, 3:45 PM

## 2020-03-01 NOTE — Progress Notes (Addendum)
PCP - Dr Woody Seller  Cardiologist - Dr. Domenic Polite  Chest x-ray - 03/01/2020  EKG - 03/01/2020  Stress Test - 2020  ECHO - 03/01/2020  Cardiac Cath - 01/28/2020  Sleep Study - no  CPAP - no  LABS- CBC, CMP, PT, PTT, T/S, U/A ASA-no instructions, I instructions to hold today.  ERAS-no HA1C-03/01/2020 Fasting Blood Sugar -   120' s Checks Blood Sugar ____1_ times a day  Anesthesia-  Hemoglobin was 8.5, one in March 8.6, I left a message for Ryan at Dr. Servando Snare' s office.  Pt denies having chest pain, sob, or fever at this time. All instructions explained to the pt, with a verbal understanding of the material. Pt agrees to go over the instructions while at home for a better understanding. Pt also instructed to self quarantine after being tested for COVID-19. The opportunity to ask questions was provided.  Denies chest pain at this time.

## 2020-03-01 NOTE — Progress Notes (Signed)
   How to Manage Your Diabetes Before and After Surgery  Why is it important to control my blood sugar before and after surgery? . Improving blood sugar levels before and after surgery helps healing and can limit problems. . A way of improving blood sugar control is eating a healthy diet by: o  Eating less sugar and carbohydrates o  Increasing activity/exercise o  Talking with your doctor about reaching your blood sugar goals . High blood sugars (greater than 180 mg/dL) can raise your risk of infections and slow your recovery, so you will need to focus on controlling your diabetes during the weeks before surgery. . Make sure that the doctor who takes care of your diabetes knows about your planned surgery including the date and location.  How do I manage my blood sugar before surgery? . Check your blood sugar at least 4 times a day, starting 2 days before surgery, to make sure that the level is not too high or low. o Check your blood sugar the morning of your surgery when you wake up and every 2 hours until you get to the Short Stay unit. . If your blood sugar is less than 70 mg/dL, you will need to treat for low blood sugar: o Treat a low blood sugar (less than 70 mg/dL) with  cup of clear juice (cranberry or apple), 4 glucose tablets, OR glucose gel. Recheck blood sugar in 15 minutes after treatment (to make sure it is greater than 70 mg/dL). If your blood sugar is not greater than 70 mg/dL on recheck, call [phone] o  for further instructions. . Report your blood sugar to the short stay nurse when you get to Short Stay.  . If you are admitted to the hospital after surgery: o Your blood sugar will be checked by the staff and you will probably be given insulin after surgery (instead of oral diabetes medicines) to make sure you have good blood sugar levels. o The goal for blood sugar control after surgery is 80-180 mg/dL.

## 2020-03-01 NOTE — Anesthesia Preprocedure Evaluation (Addendum)
Anesthesia Evaluation  Patient identified by MRN, date of birth, ID band Patient awake    Reviewed: Allergy & Precautions, NPO status , Patient's Chart, lab work & pertinent test results  History of Anesthesia Complications (+) PONV  Airway Mallampati: II  TM Distance: >3 FB Neck ROM: Full    Dental  (+) Dental Advisory Given   Pulmonary shortness of breath, former smoker,    breath sounds clear to auscultation       Cardiovascular hypertension, Pt. on medications and Pt. on home beta blockers + angina with exertion + CAD and + Peripheral Vascular Disease   Rhythm:Regular Rate:Normal     Neuro/Psych  Headaches,  Neuromuscular disease CVA    GI/Hepatic Neg liver ROS, hiatal hernia, GERD  Medicated,  Endo/Other  diabetes  Renal/GU negative Renal ROS     Musculoskeletal  (+) Arthritis ,   Abdominal   Peds  Hematology  (+) anemia ,   Anesthesia Other Findings   Reproductive/Obstetrics                            Lab Results  Component Value Date   WBC 4.2 03/01/2020   HGB 8.5 (L) 03/01/2020   HCT 28.3 (L) 03/01/2020   MCV 80.2 03/01/2020   PLT 278 03/01/2020   Lab Results  Component Value Date   CREATININE 0.75 03/01/2020   BUN 17 03/01/2020   NA 136 03/01/2020   K 4.0 03/01/2020   CL 105 03/01/2020   CO2 23 03/01/2020    Anesthesia Physical Anesthesia Plan  ASA: IV  Anesthesia Plan: General   Post-op Pain Management:    Induction: Intravenous  PONV Risk Score and Plan: 4 or greater and Treatment may vary due to age or medical condition  Airway Management Planned: Oral ETT  Additional Equipment: Arterial line, CVP, PA Cath, Ultrasound Guidance Line Placement and TEE  Intra-op Plan:   Post-operative Plan: Post-operative intubation/ventilation  Informed Consent: I have reviewed the patients History and Physical, chart, labs and discussed the procedure including the  risks, benefits and alternatives for the proposed anesthesia with the patient or authorized representative who has indicated his/her understanding and acceptance.     Dental advisory given  Plan Discussed with: CRNA  Anesthesia Plan Comments:        Anesthesia Quick Evaluation

## 2020-03-01 NOTE — Progress Notes (Signed)
Pre op ultrasound testing       has been completed. Preliminary results can be found under CV proc through chart review. Renisha Cockrum, BS, RDMS, RVT   

## 2020-03-02 ENCOUNTER — Inpatient Hospital Stay (HOSPITAL_COMMUNITY): Payer: Medicare Other

## 2020-03-02 ENCOUNTER — Encounter (HOSPITAL_COMMUNITY): Payer: Self-pay | Admitting: Cardiothoracic Surgery

## 2020-03-02 ENCOUNTER — Inpatient Hospital Stay (HOSPITAL_COMMUNITY)
Admission: RE | Admit: 2020-03-02 | Discharge: 2020-03-09 | DRG: 236 | Disposition: A | Discharge status: Home Health | Payer: Medicare Other | Attending: Cardiothoracic Surgery | Admitting: Cardiothoracic Surgery

## 2020-03-02 ENCOUNTER — Other Ambulatory Visit: Payer: Self-pay

## 2020-03-02 ENCOUNTER — Inpatient Hospital Stay (HOSPITAL_COMMUNITY)
Admission: RE | Disposition: A | Discharge status: Home / Self Care | Payer: Self-pay | Source: Home / Self Care | Attending: Cardiothoracic Surgery

## 2020-03-02 DIAGNOSIS — I4892 Unspecified atrial flutter: Secondary | ICD-10-CM | POA: Diagnosis not present

## 2020-03-02 DIAGNOSIS — Z4682 Encounter for fitting and adjustment of non-vascular catheter: Secondary | ICD-10-CM

## 2020-03-02 DIAGNOSIS — Z888 Allergy status to other drugs, medicaments and biological substances status: Secondary | ICD-10-CM

## 2020-03-02 DIAGNOSIS — E1151 Type 2 diabetes mellitus with diabetic peripheral angiopathy without gangrene: Secondary | ICD-10-CM | POA: Diagnosis present

## 2020-03-02 DIAGNOSIS — I25110 Atherosclerotic heart disease of native coronary artery with unstable angina pectoris: Secondary | ICD-10-CM | POA: Diagnosis not present

## 2020-03-02 DIAGNOSIS — Z8249 Family history of ischemic heart disease and other diseases of the circulatory system: Secondary | ICD-10-CM

## 2020-03-02 DIAGNOSIS — Z951 Presence of aortocoronary bypass graft: Secondary | ICD-10-CM

## 2020-03-02 DIAGNOSIS — D62 Acute posthemorrhagic anemia: Secondary | ICD-10-CM | POA: Diagnosis not present

## 2020-03-02 DIAGNOSIS — Z885 Allergy status to narcotic agent status: Secondary | ICD-10-CM

## 2020-03-02 DIAGNOSIS — I25118 Atherosclerotic heart disease of native coronary artery with other forms of angina pectoris: Secondary | ICD-10-CM

## 2020-03-02 DIAGNOSIS — Z09 Encounter for follow-up examination after completed treatment for conditions other than malignant neoplasm: Secondary | ICD-10-CM

## 2020-03-02 DIAGNOSIS — I252 Old myocardial infarction: Secondary | ICD-10-CM | POA: Diagnosis not present

## 2020-03-02 DIAGNOSIS — I4891 Unspecified atrial fibrillation: Secondary | ICD-10-CM | POA: Diagnosis not present

## 2020-03-02 DIAGNOSIS — I358 Other nonrheumatic aortic valve disorders: Secondary | ICD-10-CM | POA: Diagnosis present

## 2020-03-02 DIAGNOSIS — J9811 Atelectasis: Secondary | ICD-10-CM | POA: Diagnosis not present

## 2020-03-02 DIAGNOSIS — I10 Essential (primary) hypertension: Secondary | ICD-10-CM | POA: Diagnosis not present

## 2020-03-02 DIAGNOSIS — Z20822 Contact with and (suspected) exposure to covid-19: Secondary | ICD-10-CM | POA: Diagnosis not present

## 2020-03-02 DIAGNOSIS — E785 Hyperlipidemia, unspecified: Secondary | ICD-10-CM | POA: Diagnosis not present

## 2020-03-02 DIAGNOSIS — Z823 Family history of stroke: Secondary | ICD-10-CM

## 2020-03-02 DIAGNOSIS — M1990 Unspecified osteoarthritis, unspecified site: Secondary | ICD-10-CM | POA: Diagnosis present

## 2020-03-02 DIAGNOSIS — J90 Pleural effusion, not elsewhere classified: Secondary | ICD-10-CM | POA: Diagnosis not present

## 2020-03-02 DIAGNOSIS — Z87891 Personal history of nicotine dependence: Secondary | ICD-10-CM | POA: Diagnosis not present

## 2020-03-02 DIAGNOSIS — M25511 Pain in right shoulder: Secondary | ICD-10-CM | POA: Diagnosis not present

## 2020-03-02 DIAGNOSIS — I2510 Atherosclerotic heart disease of native coronary artery without angina pectoris: Secondary | ICD-10-CM | POA: Diagnosis present

## 2020-03-02 DIAGNOSIS — Z8673 Personal history of transient ischemic attack (TIA), and cerebral infarction without residual deficits: Secondary | ICD-10-CM

## 2020-03-02 DIAGNOSIS — I081 Rheumatic disorders of both mitral and tricuspid valves: Secondary | ICD-10-CM | POA: Diagnosis not present

## 2020-03-02 HISTORY — PX: CORONARY ARTERY BYPASS GRAFT: SHX141

## 2020-03-02 HISTORY — PX: TEE WITHOUT CARDIOVERSION: SHX5443

## 2020-03-02 HISTORY — PX: ENDOVEIN HARVEST OF GREATER SAPHENOUS VEIN: SHX5059

## 2020-03-02 LAB — CBC
HCT: 32.9 % — ABNORMAL LOW (ref 36.0–46.0)
HCT: 35.2 % — ABNORMAL LOW (ref 36.0–46.0)
Hemoglobin: 11.1 g/dL — ABNORMAL LOW (ref 12.0–15.0)
Hemoglobin: 11.5 g/dL — ABNORMAL LOW (ref 12.0–15.0)
MCH: 27.1 pg (ref 26.0–34.0)
MCH: 26.9 pg (ref 26.0–34.0)
MCHC: 33.7 g/dL (ref 30.0–36.0)
MCHC: 32.7 g/dL (ref 30.0–36.0)
MCV: 80.4 fL (ref 80.0–100.0)
MCV: 82.4 fL (ref 80.0–100.0)
Platelets: 132 10*3/uL — ABNORMAL LOW (ref 150–400)
Platelets: 168 10*3/uL (ref 150–400)
RBC: 4.09 MIL/uL (ref 3.87–5.11)
RBC: 4.27 MIL/uL (ref 3.87–5.11)
RDW: 14.2 % (ref 11.5–15.5)
RDW: 14.5 % (ref 11.5–15.5)
WBC: 9 10*3/uL (ref 4.0–10.5)
WBC: 13.5 10*3/uL — ABNORMAL HIGH (ref 4.0–10.5)
nRBC: 0 % (ref 0.0–0.2)
nRBC: 0 % (ref 0.0–0.2)

## 2020-03-02 LAB — GLUCOSE, CAPILLARY
Glucose-Capillary: 131 mg/dL — ABNORMAL HIGH (ref 70–99)
Glucose-Capillary: 109 mg/dL — ABNORMAL HIGH (ref 70–99)
Glucose-Capillary: 122 mg/dL — ABNORMAL HIGH (ref 70–99)
Glucose-Capillary: 86 mg/dL (ref 70–99)
Glucose-Capillary: 169 mg/dL — ABNORMAL HIGH (ref 70–99)
Glucose-Capillary: 164 mg/dL — ABNORMAL HIGH (ref 70–99)
Glucose-Capillary: 160 mg/dL — ABNORMAL HIGH (ref 70–99)
Glucose-Capillary: 139 mg/dL — ABNORMAL HIGH (ref 70–99)
Glucose-Capillary: 187 mg/dL — ABNORMAL HIGH (ref 70–99)

## 2020-03-02 LAB — POCT I-STAT 7, (LYTES, BLD GAS, ICA,H+H)
Acid-Base Excess: 1 mmol/L (ref 0.0–2.0)
Acid-Base Excess: 0 mmol/L (ref 0.0–2.0)
Acid-base deficit: 1 mmol/L (ref 0.0–2.0)
Bicarbonate: 22.5 mmol/L (ref 20.0–28.0)
Bicarbonate: 25.3 mmol/L (ref 20.0–28.0)
Bicarbonate: 24.8 mmol/L (ref 20.0–28.0)
Calcium, Ion: 1.15 mmol/L (ref 1.15–1.40)
Calcium, Ion: 1.23 mmol/L (ref 1.15–1.40)
Calcium, Ion: 1.3 mmol/L (ref 1.15–1.40)
HCT: 33 % — ABNORMAL LOW (ref 36.0–46.0)
HCT: 31 % — ABNORMAL LOW (ref 36.0–46.0)
HCT: 33 % — ABNORMAL LOW (ref 36.0–46.0)
Hemoglobin: 11.2 g/dL — ABNORMAL LOW (ref 12.0–15.0)
Hemoglobin: 10.5 g/dL — ABNORMAL LOW (ref 12.0–15.0)
Hemoglobin: 11.2 g/dL — ABNORMAL LOW (ref 12.0–15.0)
O2 Saturation: 99 %
O2 Saturation: 100 %
O2 Saturation: 99 %
Patient temperature: 35.5
Patient temperature: 35.7
Patient temperature: 36.7
Potassium: 3.3 mmol/L — ABNORMAL LOW (ref 3.5–5.1)
Potassium: 3.5 mmol/L (ref 3.5–5.1)
Potassium: 4.1 mmol/L (ref 3.5–5.1)
Sodium: 140 mmol/L (ref 135–145)
Sodium: 141 mmol/L (ref 135–145)
Sodium: 139 mmol/L (ref 135–145)
TCO2: 23 mmol/L (ref 22–32)
TCO2: 26 mmol/L (ref 22–32)
TCO2: 26 mmol/L (ref 22–32)
pCO2 arterial: 25.3 mmHg — ABNORMAL LOW (ref 32.0–48.0)
pCO2 arterial: 38.7 mmHg (ref 32.0–48.0)
pCO2 arterial: 44.3 mmHg (ref 32.0–48.0)
pH, Arterial: 7.552 — ABNORMAL HIGH (ref 7.350–7.450)
pH, Arterial: 7.417 (ref 7.350–7.450)
pH, Arterial: 7.355 (ref 7.350–7.450)
pO2, Arterial: 102 mmHg (ref 83.0–108.0)
pO2, Arterial: 180 mmHg — ABNORMAL HIGH (ref 83.0–108.0)
pO2, Arterial: 146 mmHg — ABNORMAL HIGH (ref 83.0–108.0)

## 2020-03-02 LAB — BASIC METABOLIC PANEL
Anion gap: 6 (ref 5–15)
BUN: 9 mg/dL (ref 8–23)
CO2: 22 mmol/L (ref 22–32)
Calcium: 8.4 mg/dL — ABNORMAL LOW (ref 8.9–10.3)
Chloride: 109 mmol/L (ref 98–111)
Creatinine, Ser: 0.61 mg/dL (ref 0.44–1.00)
GFR calc Af Amer: >60 mL/min (ref >60)
GFR calc non Af Amer: >60 mL/min (ref >60)
Glucose, Bld: 166 mg/dL — ABNORMAL HIGH (ref 70–99)
Potassium: 4 mmol/L (ref 3.5–5.1)
Sodium: 137 mmol/L (ref 135–145)

## 2020-03-02 LAB — APTT: aPTT: 45 seconds — ABNORMAL HIGH (ref 24–36)

## 2020-03-02 LAB — HEMOGLOBIN AND HEMATOCRIT, BLOOD
HCT: 28.4 % — ABNORMAL LOW (ref 36.0–46.0)
Hemoglobin: 9.3 g/dL — ABNORMAL LOW (ref 12.0–15.0)

## 2020-03-02 LAB — PREPARE RBC (CROSSMATCH)

## 2020-03-02 LAB — PLATELET COUNT: Platelets: 152 10*3/uL (ref 150–400)

## 2020-03-02 LAB — PROTIME-INR
INR: 1.4 — ABNORMAL HIGH (ref 0.8–1.2)
Prothrombin Time: 17.4 seconds — ABNORMAL HIGH (ref 11.4–15.2)

## 2020-03-02 SURGERY — CORONARY ARTERY BYPASS GRAFTING (CABG)
Anesthesia: General | Site: Chest

## 2020-03-02 MED ORDER — CHLORHEXIDINE GLUCONATE CLOTH 2 % EX PADS: 6.0000 | MEDICATED_PAD | Freq: Every day | CUTANEOUS | Status: DC

## 2020-03-02 MED ORDER — EPHEDRINE SULFATE 50 MG/ML IJ SOLN
INTRAMUSCULAR | Status: DC | PRN
  Administered 2020-03-02 (×2): 10 mg via INTRAVENOUS

## 2020-03-02 MED ORDER — FAMOTIDINE IN NACL 20-0.9 MG/50ML-% IV SOLN
INTRAVENOUS | Status: AC
  Filled 2020-03-02: qty 50

## 2020-03-02 MED ORDER — SODIUM CHLORIDE 0.45 % IV SOLN: INTRAVENOUS | Status: DC | PRN

## 2020-03-02 MED ORDER — FENTANYL CITRATE (PF) 250 MCG/5ML IJ SOLN
INTRAMUSCULAR | Status: AC
  Filled 2020-03-02: qty 25

## 2020-03-02 MED ORDER — ARTIFICIAL TEARS OPHTHALMIC OINT
TOPICAL_OINTMENT | OPHTHALMIC | Status: AC
  Filled 2020-03-02: qty 3.5

## 2020-03-02 MED ORDER — ONDANSETRON HCL 4 MG/2ML IJ SOLN
4.0000 mg | Freq: Four times a day (QID) | INTRAMUSCULAR | Status: DC | PRN
  Administered 2020-03-02 – 2020-03-05 (×5): 4 mg via INTRAVENOUS
  Filled 2020-03-02 (×5): qty 2

## 2020-03-02 MED ORDER — ALBUMIN HUMAN 5 % IV SOLN: INTRAVENOUS | Status: DC | PRN

## 2020-03-02 MED ORDER — DOCUSATE SODIUM 100 MG PO CAPS
200.0000 mg | ORAL_CAPSULE | Freq: Every day | ORAL | Status: DC
  Administered 2020-03-03 – 2020-03-06 (×4): 200 mg via ORAL
  Filled 2020-03-02 (×4): qty 2

## 2020-03-02 MED ORDER — LIDOCAINE 2% (20 MG/ML) 5 ML SYRINGE
INTRAMUSCULAR | Status: AC
  Filled 2020-03-02: qty 5

## 2020-03-02 MED ORDER — SODIUM CHLORIDE 0.9% FLUSH: 3.0000 mL | INTRAVENOUS | Status: DC | PRN

## 2020-03-02 MED ORDER — PROSIGHT PO TABS
1.0000 | ORAL_TABLET | Freq: Every day | ORAL | Status: DC
  Administered 2020-03-05 – 2020-03-09 (×4): 1 via ORAL
  Filled 2020-03-02 (×8): qty 1

## 2020-03-02 MED ORDER — BISACODYL 5 MG PO TBEC
10.0000 mg | DELAYED_RELEASE_TABLET | Freq: Every day | ORAL | Status: DC
  Administered 2020-03-03 – 2020-03-06 (×4): 10 mg via ORAL
  Filled 2020-03-02 (×4): qty 2

## 2020-03-02 MED ORDER — ACETAMINOPHEN 160 MG/5ML PO SOLN: 1000.0000 mg | Freq: Four times a day (QID) | ORAL | Status: DC

## 2020-03-02 MED ORDER — DEXMEDETOMIDINE HCL IN NACL 400 MCG/100ML IV SOLN: 0.0000 ug/kg/h | INTRAVENOUS | Status: DC

## 2020-03-02 MED ORDER — ALBUMIN HUMAN 5 % IV SOLN
250.0000 mL | INTRAVENOUS | Status: AC | PRN
  Administered 2020-03-02 (×3): 12.5 g via INTRAVENOUS
  Filled 2020-03-02: qty 250

## 2020-03-02 MED ORDER — MICROFIBRILLAR COLL HEMOSTAT EX PADS
MEDICATED_PAD | CUTANEOUS | Status: DC | PRN
  Administered 2020-03-02: 1 via TOPICAL

## 2020-03-02 MED ORDER — ONDANSETRON HCL 4 MG/2ML IJ SOLN
INTRAMUSCULAR | Status: DC | PRN
  Administered 2020-03-02: 4 mg via INTRAVENOUS

## 2020-03-02 MED ORDER — HYDRALAZINE HCL 20 MG/ML IJ SOLN
10.0000 mg | Freq: Four times a day (QID) | INTRAMUSCULAR | Status: DC | PRN
  Administered 2020-03-02: 10 mg via INTRAVENOUS
  Filled 2020-03-02 (×2): qty 1

## 2020-03-02 MED ORDER — ACETAMINOPHEN 500 MG PO TABS
1000.0000 mg | ORAL_TABLET | Freq: Once | ORAL | Status: AC
  Administered 2020-03-02: 1000 mg via ORAL
  Filled 2020-03-02: qty 2

## 2020-03-02 MED ORDER — CHLORHEXIDINE GLUCONATE 4 % EX LIQD: 30.0000 mL | CUTANEOUS | Status: DC

## 2020-03-02 MED ORDER — ROCURONIUM BROMIDE 10 MG/ML (PF) SYRINGE
PREFILLED_SYRINGE | INTRAVENOUS | Status: AC
  Filled 2020-03-02: qty 10

## 2020-03-02 MED ORDER — TRAMADOL HCL 50 MG PO TABS
50.0000 mg | ORAL_TABLET | Freq: Four times a day (QID) | ORAL | Status: DC | PRN
  Administered 2020-03-03 – 2020-03-05 (×5): 50 mg via ORAL
  Filled 2020-03-02 (×5): qty 1

## 2020-03-02 MED ORDER — DEXTROSE 50 % IV SOLN: 0.0000 mL | INTRAVENOUS | Status: DC | PRN

## 2020-03-02 MED ORDER — EPINEPHRINE 1 MG/10ML IJ SOSY
PREFILLED_SYRINGE | INTRAMUSCULAR | Status: AC
  Filled 2020-03-02: qty 10

## 2020-03-02 MED ORDER — FENTANYL CITRATE (PF) 250 MCG/5ML IJ SOLN
INTRAMUSCULAR | Status: DC | PRN
  Administered 2020-03-02 (×2): 100 ug via INTRAVENOUS
  Administered 2020-03-02: 50 ug via INTRAVENOUS
  Administered 2020-03-02 (×2): 250 ug via INTRAVENOUS
  Administered 2020-03-02: 25 ug via INTRAVENOUS
  Administered 2020-03-02: 100 ug via INTRAVENOUS
  Administered 2020-03-02: 225 ug via INTRAVENOUS
  Administered 2020-03-02: 150 ug via INTRAVENOUS

## 2020-03-02 MED ORDER — SODIUM CHLORIDE (PF) 0.9 % IJ SOLN
OROMUCOSAL | Status: DC | PRN
  Administered 2020-03-02: 4 mL via TOPICAL

## 2020-03-02 MED ORDER — EPHEDRINE 5 MG/ML INJ
INTRAVENOUS | Status: AC
  Filled 2020-03-02: qty 30

## 2020-03-02 MED ORDER — CHLORHEXIDINE GLUCONATE CLOTH 2 % EX PADS
6.0000 | MEDICATED_PAD | Freq: Every day | CUTANEOUS | Status: DC
  Administered 2020-03-03 – 2020-03-04 (×2): 6 via TOPICAL

## 2020-03-02 MED ORDER — SODIUM CHLORIDE 0.9 % IV SOLN: INTRAVENOUS | Status: DC

## 2020-03-02 MED ORDER — ACETAMINOPHEN 160 MG/5ML PO SOLN: 650.0000 mg | Freq: Once | ORAL | Status: AC

## 2020-03-02 MED ORDER — LEVOFLOXACIN IN D5W 750 MG/150ML IV SOLN
750.0000 mg | INTRAVENOUS | Status: AC
  Administered 2020-03-03: 750 mg via INTRAVENOUS
  Filled 2020-03-02: qty 150

## 2020-03-02 MED ORDER — EPHEDRINE 5 MG/ML INJ
INTRAVENOUS | Status: AC
  Filled 2020-03-02: qty 10

## 2020-03-02 MED ORDER — ASPIRIN EC 325 MG PO TBEC
325.0000 mg | DELAYED_RELEASE_TABLET | Freq: Every day | ORAL | Status: DC
  Administered 2020-03-03 – 2020-03-06 (×4): 325 mg via ORAL
  Filled 2020-03-02 (×4): qty 1

## 2020-03-02 MED ORDER — ESMOLOL HCL 100 MG/10ML IV SOLN
INTRAVENOUS | Status: AC
  Filled 2020-03-02: qty 10

## 2020-03-02 MED ORDER — PROPOFOL 500 MG/50ML IV EMUL
INTRAVENOUS | Status: DC | PRN
  Administered 2020-03-02: 50 ug/kg/min via INTRAVENOUS

## 2020-03-02 MED ORDER — MIDAZOLAM HCL 2 MG/2ML IJ SOLN: 2.0000 mg | INTRAMUSCULAR | Status: DC | PRN

## 2020-03-02 MED ORDER — DEXAMETHASONE SODIUM PHOSPHATE 10 MG/ML IJ SOLN
INTRAMUSCULAR | Status: AC
  Filled 2020-03-02: qty 1

## 2020-03-02 MED ORDER — MIDAZOLAM HCL 5 MG/5ML IJ SOLN
INTRAMUSCULAR | Status: DC | PRN
  Administered 2020-03-02: 1 mg via INTRAVENOUS

## 2020-03-02 MED ORDER — MUPIROCIN 2 % EX OINT
1.0000 "application " | TOPICAL_OINTMENT | Freq: Two times a day (BID) | CUTANEOUS | Status: AC
  Administered 2020-03-02 – 2020-03-06 (×8): 1 via NASAL
  Filled 2020-03-02 (×2): qty 22

## 2020-03-02 MED ORDER — MUPIROCIN 2 % EX OINT
TOPICAL_OINTMENT | CUTANEOUS | Status: AC
  Administered 2020-03-02: 1 via TOPICAL
  Filled 2020-03-02: qty 22

## 2020-03-02 MED ORDER — PHENYLEPHRINE HCL-NACL 20-0.9 MG/250ML-% IV SOLN: 0.0000 ug/min | INTRAVENOUS | Status: DC

## 2020-03-02 MED ORDER — PRESERVISION/LUTEIN PO CAPS: 1.0000 | ORAL_CAPSULE | Freq: Two times a day (BID) | ORAL | Status: DC

## 2020-03-02 MED ORDER — LACTATED RINGERS IV SOLN: INTRAVENOUS | Status: DC | PRN

## 2020-03-02 MED ORDER — PHENYLEPHRINE HCL (PRESSORS) 10 MG/ML IV SOLN
INTRAVENOUS | Status: DC | PRN
  Administered 2020-03-02 (×3): 120 ug via INTRAVENOUS

## 2020-03-02 MED ORDER — LACTATED RINGERS IV SOLN: 500.0000 mL | Freq: Once | INTRAVENOUS | Status: DC | PRN

## 2020-03-02 MED ORDER — PROTAMINE SULFATE 10 MG/ML IV SOLN
INTRAVENOUS | Status: DC | PRN
  Administered 2020-03-02: 180 mg via INTRAVENOUS

## 2020-03-02 MED ORDER — SODIUM CHLORIDE 0.9% FLUSH
3.0000 mL | Freq: Two times a day (BID) | INTRAVENOUS | Status: DC
  Administered 2020-03-03 – 2020-03-06 (×7): 3 mL via INTRAVENOUS

## 2020-03-02 MED ORDER — NITROGLYCERIN IN D5W 200-5 MCG/ML-% IV SOLN: 0.0000 ug/min | INTRAVENOUS | Status: DC

## 2020-03-02 MED ORDER — LIDOCAINE HCL (CARDIAC) PF 100 MG/5ML IV SOSY
PREFILLED_SYRINGE | INTRAVENOUS | Status: DC | PRN
  Administered 2020-03-02: 100 mg via INTRAVENOUS

## 2020-03-02 MED ORDER — ROSUVASTATIN CALCIUM 20 MG PO TABS
20.0000 mg | ORAL_TABLET | Freq: Every day | ORAL | Status: DC
  Administered 2020-03-03 – 2020-03-08 (×6): 20 mg via ORAL
  Filled 2020-03-02 (×6): qty 1

## 2020-03-02 MED ORDER — METOPROLOL TARTRATE 5 MG/5ML IV SOLN
2.5000 mg | INTRAVENOUS | Status: DC | PRN
  Administered 2020-03-05 (×2): 2.5 mg via INTRAVENOUS
  Filled 2020-03-02: qty 5

## 2020-03-02 MED ORDER — PLASMA-LYTE 148 IV SOLN
INTRAVENOUS | Status: DC | PRN
  Administered 2020-03-02: 500 mL

## 2020-03-02 MED ORDER — ONDANSETRON HCL 4 MG/2ML IJ SOLN
INTRAMUSCULAR | Status: AC
  Filled 2020-03-02: qty 2

## 2020-03-02 MED ORDER — SODIUM CHLORIDE (PF) 0.9 % IJ SOLN
INTRAMUSCULAR | Status: AC
  Filled 2020-03-02: qty 10

## 2020-03-02 MED ORDER — PANTOPRAZOLE SODIUM 40 MG PO TBEC
40.0000 mg | DELAYED_RELEASE_TABLET | Freq: Every day | ORAL | Status: DC
  Administered 2020-03-04 – 2020-03-06 (×3): 40 mg via ORAL
  Filled 2020-03-02 (×3): qty 1

## 2020-03-02 MED ORDER — HEPARIN SODIUM (PORCINE) 1000 UNIT/ML IJ SOLN
INTRAMUSCULAR | Status: DC | PRN
  Administered 2020-03-02: 18000 [IU] via INTRAVENOUS

## 2020-03-02 MED ORDER — PHENYLEPHRINE 40 MCG/ML (10ML) SYRINGE FOR IV PUSH (FOR BLOOD PRESSURE SUPPORT)
PREFILLED_SYRINGE | INTRAVENOUS | Status: AC
  Filled 2020-03-02: qty 10

## 2020-03-02 MED ORDER — MIDAZOLAM HCL (PF) 10 MG/2ML IJ SOLN
INTRAMUSCULAR | Status: AC
  Filled 2020-03-02: qty 2

## 2020-03-02 MED ORDER — HEMOSTATIC AGENTS (NO CHARGE) OPTIME
TOPICAL | Status: DC | PRN
  Administered 2020-03-02: 1 via TOPICAL

## 2020-03-02 MED ORDER — ACETAMINOPHEN 650 MG RE SUPP
650.0000 mg | Freq: Once | RECTAL | Status: AC
  Administered 2020-03-02: 16:00:00 650 mg via RECTAL

## 2020-03-02 MED ORDER — CHLORHEXIDINE GLUCONATE 0.12 % MT SOLN
15.0000 mL | Freq: Once | OROMUCOSAL | Status: AC
  Administered 2020-03-02 – 2020-03-03 (×2): 15 mL via OROMUCOSAL
  Filled 2020-03-02: qty 15

## 2020-03-02 MED ORDER — ARTIFICIAL TEARS OPHTHALMIC OINT
TOPICAL_OINTMENT | OPHTHALMIC | Status: DC | PRN
  Administered 2020-03-02: 1 via OPHTHALMIC

## 2020-03-02 MED ORDER — MUPIROCIN 2 % EX OINT: 1.0000 "application " | TOPICAL_OINTMENT | Freq: Once | CUTANEOUS | Status: AC

## 2020-03-02 MED ORDER — SODIUM CHLORIDE 0.9 % IV SOLN: 250.0000 mL | INTRAVENOUS | Status: DC

## 2020-03-02 MED ORDER — MAGNESIUM SULFATE 4 GM/100ML IV SOLN
4.0000 g | Freq: Once | INTRAVENOUS | Status: AC
  Administered 2020-03-02: 4 g via INTRAVENOUS
  Filled 2020-03-02: qty 100

## 2020-03-02 MED ORDER — ASPIRIN 81 MG PO CHEW: 324.0000 mg | CHEWABLE_TABLET | Freq: Every day | ORAL | Status: DC

## 2020-03-02 MED ORDER — METOPROLOL TARTRATE 12.5 MG HALF TABLET
12.5000 mg | ORAL_TABLET | Freq: Two times a day (BID) | ORAL | Status: DC
  Administered 2020-03-03 (×2): 12.5 mg via ORAL
  Filled 2020-03-02 (×2): qty 1

## 2020-03-02 MED ORDER — INSULIN REGULAR(HUMAN) IN NACL 100-0.9 UT/100ML-% IV SOLN: INTRAVENOUS | Status: DC

## 2020-03-02 MED ORDER — FENTANYL CITRATE (PF) 100 MCG/2ML IJ SOLN
50.0000 ug | INTRAMUSCULAR | Status: DC | PRN
  Administered 2020-03-02 – 2020-03-03 (×2): 50 ug via INTRAVENOUS
  Filled 2020-03-02 (×3): qty 2

## 2020-03-02 MED ORDER — BISACODYL 10 MG RE SUPP: 10.0000 mg | Freq: Every day | RECTAL | Status: DC

## 2020-03-02 MED ORDER — ACETAMINOPHEN 500 MG PO TABS
1000.0000 mg | ORAL_TABLET | Freq: Four times a day (QID) | ORAL | Status: DC
  Administered 2020-03-03 – 2020-03-06 (×10): 1000 mg via ORAL
  Filled 2020-03-02 (×12): qty 2

## 2020-03-02 MED ORDER — LACTATED RINGERS IV SOLN: INTRAVENOUS | Status: DC

## 2020-03-02 MED ORDER — PROPOFOL 10 MG/ML IV BOLUS
INTRAVENOUS | Status: DC | PRN
  Administered 2020-03-02: 20 mg via INTRAVENOUS
  Administered 2020-03-02: 30 mg via INTRAVENOUS
  Administered 2020-03-02: 110 mg via INTRAVENOUS

## 2020-03-02 MED ORDER — METOPROLOL TARTRATE 12.5 MG HALF TABLET
12.5000 mg | ORAL_TABLET | Freq: Once | ORAL | Status: DC
  Filled 2020-03-02: qty 1

## 2020-03-02 MED ORDER — VANCOMYCIN HCL IN DEXTROSE 1-5 GM/200ML-% IV SOLN
1000.0000 mg | Freq: Once | INTRAVENOUS | Status: AC
  Administered 2020-03-02: 1000 mg via INTRAVENOUS
  Filled 2020-03-02: qty 200

## 2020-03-02 MED ORDER — DEXAMETHASONE SODIUM PHOSPHATE 10 MG/ML IJ SOLN
INTRAMUSCULAR | Status: DC | PRN
  Administered 2020-03-02: 5 mg via INTRAVENOUS

## 2020-03-02 MED ORDER — PROPOFOL 10 MG/ML IV BOLUS
INTRAVENOUS | Status: AC
  Filled 2020-03-02: qty 40

## 2020-03-02 MED ORDER — PHENYLEPHRINE HCL-NACL 20-0.9 MG/250ML-% IV SOLN
INTRAVENOUS | Status: DC | PRN
  Administered 2020-03-02: 75 ug/min via INTRAVENOUS
  Administered 2020-03-02: 55 ug/min via INTRAVENOUS

## 2020-03-02 MED ORDER — CHLORHEXIDINE GLUCONATE 0.12 % MT SOLN
15.0000 mL | OROMUCOSAL | Status: AC
  Administered 2020-03-02: 15 mL via OROMUCOSAL

## 2020-03-02 MED ORDER — FAMOTIDINE IN NACL 20-0.9 MG/50ML-% IV SOLN
20.0000 mg | Freq: Two times a day (BID) | INTRAVENOUS | Status: AC
  Administered 2020-03-03: 20 mg via INTRAVENOUS
  Filled 2020-03-02: qty 50

## 2020-03-02 MED ORDER — POTASSIUM CHLORIDE 10 MEQ/50ML IV SOLN
10.0000 meq | INTRAVENOUS | Status: DC
  Administered 2020-03-02 (×2): 10 meq via INTRAVENOUS

## 2020-03-02 MED ORDER — METOPROLOL TARTRATE 25 MG/10 ML ORAL SUSPENSION
12.5000 mg | Freq: Two times a day (BID) | ORAL | Status: DC
  Administered 2020-03-02: 12.5 mg
  Filled 2020-03-02: qty 5

## 2020-03-02 MED ORDER — ROCURONIUM BROMIDE 100 MG/10ML IV SOLN
INTRAVENOUS | Status: DC | PRN
  Administered 2020-03-02: 40 mg via INTRAVENOUS
  Administered 2020-03-02: 30 mg via INTRAVENOUS
  Administered 2020-03-02: 70 mg via INTRAVENOUS
  Administered 2020-03-02: 30 mg via INTRAVENOUS

## 2020-03-02 MED ORDER — SODIUM CHLORIDE 0.9 % IR SOLN
Status: DC | PRN
  Administered 2020-03-02: 6000 mL

## 2020-03-02 SURGICAL SUPPLY — 90 items
BAG DECANTER FOR FLEXI CONT (MISCELLANEOUS) ×4 IMPLANT
BLADE CLIPPER SURG (BLADE) ×4 IMPLANT
BLADE STERNUM SYSTEM 6 (BLADE) ×4 IMPLANT
BLADE SURG 11 STRL SS (BLADE) ×4 IMPLANT
BNDG ELASTIC 4X5.8 VLCR STR LF (GAUZE/BANDAGES/DRESSINGS) ×8 IMPLANT
BNDG ELASTIC 6X15 VLCR STRL LF (GAUZE/BANDAGES/DRESSINGS) ×8 IMPLANT
BNDG ELASTIC 6X5.8 VLCR STR LF (GAUZE/BANDAGES/DRESSINGS) ×4 IMPLANT
BNDG GAUZE ELAST 4 BULKY (GAUZE/BANDAGES/DRESSINGS) ×8 IMPLANT
CANISTER SUCT 3000ML PPV (MISCELLANEOUS) ×4 IMPLANT
CANNULA VESSEL 3MM 2 BLNT TIP (CANNULA) ×4 IMPLANT
CATH CPB KIT GERHARDT (MISCELLANEOUS) ×4 IMPLANT
CATH THORACIC 28FR (CATHETERS) ×4 IMPLANT
CLIP FOGARTY SPRING 6M (CLIP) ×4 IMPLANT
CLIP VESOCCLUDE SM WIDE 24/CT (CLIP) ×4 IMPLANT
DERMABOND ADVANCED (GAUZE/BANDAGES/DRESSINGS) ×2
DERMABOND ADVANCED .7 DNX12 (GAUZE/BANDAGES/DRESSINGS) ×6 IMPLANT
DRAIN CHANNEL 28F RND 3/8 FF (WOUND CARE) ×4 IMPLANT
DRAPE CARDIOVASCULAR INCISE (DRAPES) ×4
DRAPE SLUSH/WARMER DISC (DRAPES) ×4 IMPLANT
DRAPE SRG 135X102X78XABS (DRAPES) ×3 IMPLANT
DRSG AQUACEL AG ADV 3.5X14 (GAUZE/BANDAGES/DRESSINGS) ×4 IMPLANT
ELECT BLADE 4.0 EZ CLEAN MEGAD (MISCELLANEOUS) ×4
ELECT REM PT RETURN 9FT ADLT (ELECTROSURGICAL) ×8
ELECTRODE BLDE 4.0 EZ CLN MEGD (MISCELLANEOUS) ×3 IMPLANT
ELECTRODE REM PT RTRN 9FT ADLT (ELECTROSURGICAL) ×6 IMPLANT
FELT TEFLON 1X6 (MISCELLANEOUS) ×4 IMPLANT
FILTER SMOKE EVAC ULPA (FILTER) ×4 IMPLANT
GAUZE SPONGE 4X4 12PLY STRL (GAUZE/BANDAGES/DRESSINGS) ×8 IMPLANT
GAUZE SPONGE 4X4 12PLY STRL LF (GAUZE/BANDAGES/DRESSINGS) ×4 IMPLANT
GLOVE BIO SURGEON STRL SZ 6.5 (GLOVE) ×36 IMPLANT
GLOVE BIOGEL PI IND STRL 6 (GLOVE) ×12 IMPLANT
GLOVE BIOGEL PI IND STRL 6.5 (GLOVE) ×9 IMPLANT
GLOVE BIOGEL PI IND STRL 8.5 (GLOVE) ×3 IMPLANT
GLOVE BIOGEL PI INDICATOR 6 (GLOVE) ×4
GLOVE BIOGEL PI INDICATOR 6.5 (GLOVE) ×3
GLOVE BIOGEL PI INDICATOR 8.5 (GLOVE) ×1
GOWN STRL REUS W/ TWL LRG LVL3 (GOWN DISPOSABLE) ×30 IMPLANT
GOWN STRL REUS W/TWL LRG LVL3 (GOWN DISPOSABLE) ×40
HEMOSTAT POWDER SURGIFOAM 1G (HEMOSTASIS) ×12 IMPLANT
HEMOSTAT SURGICEL 2X14 (HEMOSTASIS) ×8 IMPLANT
KIT BASIN OR (CUSTOM PROCEDURE TRAY) ×4 IMPLANT
KIT CATH SUCT 8FR (CATHETERS) ×4 IMPLANT
KIT SUCTION CATH 14FR (SUCTIONS) ×8 IMPLANT
KIT TURNOVER KIT B (KITS) ×4 IMPLANT
KIT VASOVIEW HEMOPRO 2 VH 4000 (KITS) ×4 IMPLANT
LEAD PACING MYOCARDI (MISCELLANEOUS) ×4 IMPLANT
MARKER GRAFT CORONARY BYPASS (MISCELLANEOUS) ×12 IMPLANT
NS IRRIG 1000ML POUR BTL (IV SOLUTION) ×20 IMPLANT
PACK E OPEN HEART (SUTURE) ×4 IMPLANT
PACK OPEN HEART (CUSTOM PROCEDURE TRAY) ×4 IMPLANT
PAD ARMBOARD 7.5X6 YLW CONV (MISCELLANEOUS) ×4 IMPLANT
PAD ELECT DEFIB RADIOL ZOLL (MISCELLANEOUS) ×4 IMPLANT
PENCIL BUTTON HOLSTER BLD 10FT (ELECTRODE) ×4 IMPLANT
PENCIL SMOKE EVACUATOR (MISCELLANEOUS) ×4 IMPLANT
POSITIONER HEAD DONUT 9IN (MISCELLANEOUS) ×4 IMPLANT
PUNCH AORTIC ROTATE 4.0MM (MISCELLANEOUS) ×4 IMPLANT
SET CARDIOPLEGIA MPS 5001102 (MISCELLANEOUS) ×4 IMPLANT
SLEEVE SUCTION 125 (MISCELLANEOUS) ×4 IMPLANT
SPONGE LAP 18X18 RF (DISPOSABLE) ×20 IMPLANT
SURGIFLO W/THROMBIN 8M KIT (HEMOSTASIS) ×4 IMPLANT
SUT BONE WAX W31G (SUTURE) ×4 IMPLANT
SUT ETHILON 3 0 FSL (SUTURE) ×8 IMPLANT
SUT MNCRL AB 4-0 PS2 18 (SUTURE) ×12 IMPLANT
SUT MON AB 3-0 SH 27 (SUTURE) ×4
SUT MON AB 3-0 SH27 (SUTURE) ×3 IMPLANT
SUT PROLENE 3 0 SH1 36 (SUTURE) ×4 IMPLANT
SUT PROLENE 4 0 TF (SUTURE) ×8 IMPLANT
SUT PROLENE 5 0 C 1 36 (SUTURE) ×4 IMPLANT
SUT PROLENE 6 0 CC (SUTURE) ×8 IMPLANT
SUT PROLENE 7 0 BV1 MDA (SUTURE) ×12 IMPLANT
SUT PROLENE 8 0 BV175 6 (SUTURE) ×8 IMPLANT
SUT STEEL 6MS V (SUTURE) ×4 IMPLANT
SUT STEEL SZ 6 DBL 3X14 BALL (SUTURE) ×4 IMPLANT
SUT VIC AB 1 CTX 18 (SUTURE) ×8 IMPLANT
SUT VIC AB 3-0 SH 27 (SUTURE) ×4
SUT VIC AB 3-0 SH 27X BRD (SUTURE) ×3 IMPLANT
SUT VIC AB 3-0 X1 27 (SUTURE) ×4 IMPLANT
SYSTEM SAHARA CHEST DRAIN ATS (WOUND CARE) ×4 IMPLANT
TAPE CLOTH SURG 4X10 WHT LF (GAUZE/BANDAGES/DRESSINGS) ×4 IMPLANT
TAPE PAPER 2X10 WHT MICROPORE (GAUZE/BANDAGES/DRESSINGS) ×4 IMPLANT
TOWEL GREEN STERILE (TOWEL DISPOSABLE) ×4 IMPLANT
TOWEL GREEN STERILE FF (TOWEL DISPOSABLE) ×4 IMPLANT
TRAP FLUID SMOKE EVACUATOR (MISCELLANEOUS) ×4 IMPLANT
TRAY FOLEY SLVR 14FR TEMP STAT (SET/KITS/TRAYS/PACK) ×4 IMPLANT
TRAY FOLEY SLVR 16FR TEMP STAT (SET/KITS/TRAYS/PACK) IMPLANT
TUBE SUCT INTRACARD DLP 20F (MISCELLANEOUS) ×4 IMPLANT
TUBING LAP HI FLOW INSUFFLATIO (TUBING) ×4 IMPLANT
UNDERPAD 30X30 (UNDERPADS AND DIAPERS) ×4 IMPLANT
WATER STERILE IRR 1000ML POUR (IV SOLUTION) ×8 IMPLANT
YANKAUER SUCT BULB TIP NO VENT (SUCTIONS) ×4 IMPLANT

## 2020-03-02 NOTE — Procedures (Signed)
Extubation Procedure Note  Patient Details:   Name: [name] DOB: [birth year] MRN: [id number]   Airway Documentation:    Vent end date: 03/02/20 Vent end time: 2100   Evaluation  O2 sats: stable throughout Complications: No apparent complications Patient did tolerate procedure well. Bilateral Breath Sounds: Clear   Patient extubated to 5L [place]. NIF -24  VC 885ml  Positive cuff leak.  Patient able to vocalize post extubation.   Catha Brow 03/02/2020, 9:12 PM

## 2020-03-02 NOTE — Anesthesia Procedure Notes (Signed)
Arterial Line Insertion Start/End4/21/2021 9:43 AM, 03/02/2020 9:43 AM Performed by: Kathryne Hitch, CRNA, CRNA  Patient location: Pre-op. Preanesthetic checklist: patient identified, IV checked, site marked, risks and benefits discussed, surgical consent, monitors and equipment checked, pre-op evaluation and timeout performed Lidocaine 1% used for infiltration Right, radial was placed Catheter size: 20 Fr Hand hygiene performed  and maximum sterile barriers used   Attempts: 2 Procedure performed without using ultrasound guided technique. Following insertion, dressing applied and Biopatch. Post procedure assessment: normal and unchanged

## 2020-03-02 NOTE — Anesthesia Procedure Notes (Signed)
Central Venous Catheter Insertion Performed by: Suzette Battiest, MD, anesthesiologist Patient location: Pre-op. Preanesthetic checklist: patient identified, IV checked, site marked, risks and benefits discussed, surgical consent, monitors and equipment checked, pre-op evaluation, timeout performed and anesthesia consent Position: Trendelenburg Lidocaine 1% used for infiltration and patient sedated Hand hygiene performed , maximum sterile barriers used  and Seldinger technique used Catheter size: 8.5 Fr Total catheter length 10. Central line and PA cath was placed.Sheath introducer Swan type:thermodilution PA Cath depth:50 Procedure performed using ultrasound guided technique. Ultrasound Notes:anatomy identified, needle tip was noted to be adjacent to the nerve/plexus identified, no ultrasound evidence of intravascular and/or intraneural injection and image(s) printed for medical record Attempts: 1 Following insertion, line sutured, dressing applied and Biopatch. Post procedure assessment: blood return through all ports, free fluid flow and no air  Patient tolerated the procedure well with no immediate complications.

## 2020-03-02 NOTE — Progress Notes (Signed)
      [address]       [place] [address]             [phone]      S/p CABG x 3  Starting to wake up , following commands  BP 104/69   Pulse 79   Temp 97.9 F (36.6 C)   Resp 12   Ht 5' 1.5" (1.562 m)   Wt 50.3 kg   SpO2 96%   BMI 20.63 kg/m  23/13 CI = 2.6   Intake/Output Summary (Last 24 hours) at 03/02/2020 1821 Last data filed at 03/02/2020 1700 Gross per 24 hour  Intake 6144.68 ml  Output 3240 ml  Net 2904.68 ml   Hct= 31, K= 3.5- being supplemented  Remo Lipps C. Roxan Hockey, MD [facility] [phone]

## 2020-03-02 NOTE — Anesthesia Procedure Notes (Signed)
Procedure Name: Intubation Date/Time: 03/02/2020 9:42 AM Performed by: Kathryne Hitch, CRNA Pre-anesthesia Checklist: Patient identified, Emergency Drugs available, Suction available and Patient being monitored Patient Re-evaluated:Patient Re-evaluated prior to induction Oxygen Delivery Method: Circle system utilized Preoxygenation: Pre-oxygenation with 100% oxygen Induction Type: IV induction Ventilation: Mask ventilation without difficulty Laryngoscope Size: Mac and 3 Grade View: Grade I Tube type: Oral Tube size: 8.0 mm Number of attempts: 1 Airway Equipment and Method: Stylet and Oral airway Placement Confirmation: ETT inserted through vocal cords under direct vision,  positive ETCO2 and breath sounds checked- equal and bilateral Secured at: 21 cm Tube secured with: Tape Dental Injury: Teeth and Oropharynx as per pre-operative assessment  Comments: Chelsea, SRNA intubated patient uneventfully. =BBS, +ETCO2.

## 2020-03-02 NOTE — Anesthesia Postprocedure Evaluation (Signed)
Anesthesia Post Note  Patient: [name]  Procedure(s) Performed: CORONARY ARTERY BYPASS GRAFTING (CABG)x 3, LIMA TO LAD, SVG TO DIAGONAL, SVG TO RCA, WITH OPEN HARVESTING OF LEFT LOWER GREATER SAPHENOUS VEIN AND RIGHT AND LEFT GREATER SAPHENOUS VEIN HARVESTED ENDOSCOPICALLY (N/A Chest) TRANSESOPHAGEAL ECHOCARDIOGRAM (TEE) (N/A ) Endovein Harvest Of Greater Saphenous Vein (Bilateral )     Patient location during evaluation: SICU Anesthesia Type: General Level of consciousness: sedated Pain management: pain level controlled Vital Signs Assessment: post-procedure vital signs reviewed and stable Respiratory status: patient remains intubated per anesthesia plan Cardiovascular status: stable Postop Assessment: no apparent nausea or vomiting Anesthetic complications: no    Last Vitals:  Vitals:   03/02/20 1745 03/02/20 1751  BP:    Pulse: 92 84  Resp: 12 12  Temp: (!) 36.3 C (!) 36.4 C  SpO2: 97% 97%    Last Pain:  Vitals:   03/02/20 1751  TempSrc: Core (Comment)  PainSc:                  Tiajuana Amass

## 2020-03-02 NOTE — Brief Op Note (Addendum)
      [address]       [place] [address]             [phone]     03/02/2020  1:08 PM  PATIENT:  [name]  84 y.o. female  PRE-OPERATIVE DIAGNOSIS:  CAD  POST-OPERATIVE DIAGNOSIS:  CAD  PROCEDURE: TRANSESOPHAGEAL ECHOCARDIOGRAM (TEE), CORONARY ARTERY BYPASS GRAFTING (CABG)x 3 (LIMA to LAD, SVG to DIAGONAL, SVG to RCA)  WITH OPEN HARVESTING OF LEFT LOWER GREATER SAPHENOUS VEIN and BILATERAL GREATER SAPHENOUS VEIN via Gate  SURGEON:  Surgeon(s) and Role:    Grace Isaac, MD - Primary  PHYSICIAN ASSISTANT: Lars Pinks PA-C  ASSISTANTS: Dineen Kid RNFA  ANESTHESIA:   general  EBL:  Per anesthesia and perfusion record   DRAINS: Chest tubes placed in the mediastinal and pleural spaces   COUNTS:  YES  DICTATION: .Dragon Dictation  PLAN OF CARE: Admit to inpatient   PATIENT DISPOSITION:  ICU - intubated and hemodynamically stable.   Delay start of Pharmacological VTE agent (>24hrs) due to surgical blood loss or risk of bleeding: yes  BASELINE WEIGHT: 50.3 kg

## 2020-03-02 NOTE — Transfer of Care (Signed)
Immediate Anesthesia Transfer of Care Note  Patient: [name]  Procedure(s) Performed: CORONARY ARTERY BYPASS GRAFTING (CABG)x 3, LIMA TO LAD, SVG TO DIAGONAL, SVG TO RCA, WITH OPEN HARVESTING OF LEFT LOWER GREATER SAPHENOUS VEIN AND RIGHT AND LEFT GREATER SAPHENOUS VEIN HARVESTED ENDOSCOPICALLY (N/A Chest) TRANSESOPHAGEAL ECHOCARDIOGRAM (TEE) (N/A ) Endovein Harvest Of Greater Saphenous Vein (Bilateral )  Patient Location: ICU  Anesthesia Type:GA combined with regional for post-op pain  Level of Consciousness: Patient remains intubated per anesthesia plan  Airway & Oxygen Therapy: Patient remains intubated per anesthesia plan and Patient placed on Ventilator (see vital sign flow sheet for setting)  Post-op Assessment: Report given to RN and Post -op Vital signs reviewed and stable  Post vital signs: Reviewed and stable  Last Vitals:  Vitals Value Taken Time  BP    Temp    Pulse 89 03/02/20 1543  Resp 12 03/02/20 1543  SpO2 97 % 03/02/20 1543  Vitals shown include unvalidated device data.  Last Pain:  Vitals:   03/02/20 0700  TempSrc:   PainSc: 0-No pain      Patients Stated Pain Goal: 4 [id number] [phone]  Complications: No apparent anesthesia complications

## 2020-03-02 NOTE — H&P (Signed)
[address]       [place] [address]             [facility] Record [id number] Date of Birth: 27-Jan-1936  Referring: [name], MD Primary Care: Glenda Chroman, MD Primary Cardiologist: Rozann Lesches, MD  Chief Complaint:    Chief Complaint  Patient presents with  . Coronary Artery Disease    visit re-consult for surgery...CATH 01/28/20...ECHO NEVER DONE...advised to go to the ED 02/25/20...BUT DID NOT GO.   History of Present Illness:    [name] 84 y.o. female is seen in the office two days ago  for after she had called the cardiology office because of increasing chest pain.  The patient was  seen in March 2021 while in the hospital  to consider coronary artery bypass grafting for severe two-vessel coronary artery disease, with a highly calcified complex tortuous right coronary artery that was not felt suitable for stenting or atherectomy.  Previously discussed with the patient the option of coronary artery bypass grafting on her hospitalization in March, at that time she did not wish to proceed with coronary artery bypass graft.  Since home she notes continued anginal chest pain and low levels of activity, his usual activity around the house.  Patient has a PMH of HTN, Hyperlipidemia, Penetrating, atherosclerotic Ulcer of Abdominal Aorta with stent graft, history of Renal artery stenosis with stents, H/O left carotid endarterectomy, claudication, and Ectopic Atrial Tachycardia.  The patient presented with a 6 month history of chest pain in march 2021.    At that time her symptoms occurred with minimal exertion and usually relieves with rest.  She states that she currently has not been active because she cant even walk to the mailbox without developing chest pain and shortness of breath.  She is unable to clean her house without stopping multiple times.    She had a stress test in December 2020  which showed a likely prior MI and a mild degree of peri-infarct ischemia.  Her EF was preserved. She was evaluated by Dr. Domenic Polite who recommended cardiac catheterization in January 2021, however the patient initially declined and wished to speak with her daughter.  They spoke again via virtual visit in February at which time her symptoms persisted despite adjustment of medications.    Her cath w by Dr. Saunders Revel in March  and showed 2 vessel CAD.  I      Current Activity/ Functional Status:  Patient is independent with mobility/ambulation, transfers, ADL's, IADL's.   Zubrod Score: At the time of surgery this patient's most appropriate activity status/level should be described as: []     0    Normal activity, no symptoms [x]     1    Restricted in physical strenuous activity but ambulatory, able to do out light work []     2    Ambulatory and capable of self care, unable to do work activities, up and about               >50 % of waking hours                              []     3    Only limited self care, in bed greater than 50%  of waking hours []     4    Completely disabled, no self care, confined to bed or chair []     5    Moribund   Past Medical History:  Diagnosis Date  . Anemia   . Anxiety   . Arthritis   . Chronic constipation   . Claudication [facility])    Chronic occlusion of the right limb of aortic stent graft.  . Complication of anesthesia 1982   "rapid heart beat"  . Diverticulosis    Left sided noted on colonoscopy 02/2006  . Dyspnea    with exertion  . Ectopic atrial tachycardia ([place])   . Esophageal reflux disease   . Essential hypertension   . Hiatal hernia   . Hyperlipidemia   . Lumbar radiculopathy   . Macular degeneration   . Migraines    migraines  . Non-obstructive CAD    a. Reportedly nonobstructive at catheterization in the 1990s;  b. 03/2014 Cath: LM nl, LAD 30-40p, 85m, D1 20ost, D2 50ost/p, D3 small, LCX nl/nondominant, OM 1/2/3 nl, RCA 20-30p, 26m, PDA nl, RPL1/2   small, nl.  . Penetrating atherosclerotic ulcer of aorta (HCC)    Complex ulcerated plaque of the infrarenal abdominal aorta mild renal artery stenosis on the right side, normal left renal artery catheterization 2009, status post aortic stent graft  . PONV (postoperative nausea and vomiting)   . Renal artery stenosis ([place])    a. Right renal artery stent April 2013 - Dr. Fletcher Anon;  b. 03/2014 PTA/stenting of RRA 2/2 ISR: 6x18 Herculink stent.  . Stroke ([place])    TIA  . Trigeminal neuralgia    Right  . Type 2 diabetes mellitus (HCC)    diet controlled    Past Surgical History:  Procedure Laterality Date  . ABDOMINAL AORTAGRAM N/A 02/13/2012   Procedure: ABDOMINAL Maxcine Ham;  Surgeon: Wellington Hampshire, MD;  Location: [place] CATH LAB;  Service: Cardiovascular;  Laterality: N/A;  . ABDOMINAL AORTAGRAM N/A 03/17/2014   Procedure: ABDOMINAL Maxcine Ham;  Surgeon: Wellington Hampshire, MD;  Location: [place] CATH LAB;  Service: Cardiovascular;  Laterality: N/A;  . ABDOMINAL HYSTERECTOMY  1982  . BREAST BIOPSY Left 1990's  . CAROTID ENDARTERECTOMY Left 1993  . COLONOSCOPY    . Endologix powerlink bifurcated     Covered stent graft  . LEFT HEART CATH AND CORONARY ANGIOGRAPHY N/A 01/28/2020   Procedure: LEFT HEART CATH AND CORONARY ANGIOGRAPHY;  Surgeon: Nelva Bush, MD;  Location: [place] CV LAB;  Service: Cardiovascular;  Laterality: N/A;  . LEFT HEART CATHETERIZATION WITH CORONARY ANGIOGRAM N/A 03/17/2014   Procedure: LEFT HEART CATHETERIZATION WITH CORONARY ANGIOGRAM;  Surgeon: Wellington Hampshire, MD;  Location: [place] CATH LAB;  Service: Cardiovascular;  Laterality: N/A;  . LUMBAR [place]  . PERCUTANEOUS STENT INTERVENTION Right 03/17/2014   Procedure: PERCUTANEOUS STENT INTERVENTION;  Surgeon: Wellington Hampshire, MD;  Location: [place] CATH LAB;  Service: Cardiovascular;  Laterality: Right;  Renal  . RENAL ANGIOGRAM N/A 02/13/2012   Procedure: RENAL ANGIOGRAM;  Surgeon: Wellington Hampshire, MD;  Location: [place] CATH  LAB;  Service: Cardiovascular;  Laterality: N/A;  . RENAL ANGIOGRAM N/A 03/17/2014   Procedure: RENAL ANGIOGRAM;  Surgeon: Wellington Hampshire, MD;  Location: [place] CATH LAB;  Service: Cardiovascular;  Laterality: N/A;  . TONSILLECTOMY AND ADENOIDECTOMY  1947    Family History  Problem Relation Age of Onset  . Heart failure Mother   . Coronary artery disease Mother   . Heart disease  Mother   . Hypertension Mother   . Stroke Mother   . Heart attack Father   . Heart disease Father   . Hypertension Father   . Heart disease Brother   . Hypertension Sister   . Parkinson's disease Sister   . Kidney disease Sister   . Stroke Sister   . Hypertension Brother   . Stroke Sister   . Hypertension Sister   . Breast cancer Neg Hx      Social History   Tobacco Use  Smoking Status Former Smoker  . Packs/day: 0.20  . Years: 1.00  . Pack years: 0.20  . Types: Cigarettes  . Quit date: 11/13/1979  . Years since quitting: 40.3  Smokeless Tobacco Never Used  Tobacco Comment   "smoked a few months"    Social History   Substance and Sexual Activity  Alcohol Use No  . Alcohol/week: 0.0 standard drinks     Allergies  Allergen Reactions  . Cefixime Shortness Of Breath    Patient is not familiar with this listed allergy  . Conjugated Estrogens Other (See Comments)    REACTION: flushing  . Morphine And Related Other (See Comments)    Morphine injection Neck pain  . Codeine Other (See Comments)    REACTION: dizziness  . Iohexol      Desc: pt has anxiety and an irregular heartbeat. contrast exacerbates this. pt was very uncomfortable after a 20cc bolus for an miroi. we did w/o iv 11/09 per dr. Kris Hartmann. pt will talk w/ md about this and will probably not get iv contrast in the future at her re, Onset Date: [id number]   . Adenosine Other (See Comments)    Elevated heart rate when doing stress test    Current Facility-Administered Medications  Medication Dose Route Frequency Provider Last Rate  Last Admin  . chlorhexidine (HIBICLENS) 4 % liquid 2 application  30 mL Topical UD Grace Isaac, MD      . dexmedetomidine (PRECEDEX) 400 MCG/100ML (4 mcg/mL) infusion  0.1-0.7 mcg/kg/hr Intravenous To OR Grace Isaac, MD      . EPINEPHrine (ADRENALIN) 4 mg in NS 250 mL (0.016 mg/mL) premix infusion  0-10 mcg/min Intravenous To OR Grace Isaac, MD      . heparin 30,000 units/NS 1000 mL solution for CELLSAVER   Other To OR Grace Isaac, MD      . heparin sodium (porcine) 2,500 Units, papaverine 30 mg in electrolyte-148 (PLASMALYTE-148) 500 mL irrigation   Irrigation To OR Grace Isaac, MD      . insulin regular, human (MYXREDLIN) 100 units/ 100 mL infusion   Intravenous To OR Grace Isaac, MD      . levofloxacin (LEVAQUIN) IVPB 500 mg  500 mg Intravenous To OR Grace Isaac, MD      . magnesium sulfate (IV Push/IM) injection 40 mEq  40 mEq Other To OR Grace Isaac, MD      . metoprolol tartrate (LOPRESSOR) tablet 12.5 mg  12.5 mg Oral Once Grace Isaac, MD      . milrinone (PRIMACOR) 20 MG/100 ML (0.2 mg/mL) infusion  0.3 mcg/kg/min Intravenous To OR Grace Isaac, MD      . nitroGLYCERIN 50 mg in dextrose 5 % 250 mL (0.2 mg/mL) infusion  2-200 mcg/min Intravenous To OR Grace Isaac, MD      . norepinephrine (LEVOPHED) 4mg  in 222mL premix infusion  0-40 mcg/min Intravenous To OR Grace Isaac, MD      .  phenylephrine (NEOSYNEPHRINE) 20-0.9 MG/250ML-% infusion  30-200 mcg/min Intravenous To OR Grace Isaac, MD      . potassium chloride injection 80 mEq  80 mEq Other To OR Grace Isaac, MD      . tranexamic acid (CYKLOKAPRON) 2,500 mg in sodium chloride 0.9 % 250 mL (10 mg/mL) infusion  1.5 mg/kg/hr Intravenous To OR Grace Isaac, MD      . tranexamic acid (CYKLOKAPRON) bolus via infusion - over 30 minutes 769.5 mg  15 mg/kg Intravenous To OR Grace Isaac, MD      . tranexamic acid (CYKLOKAPRON) pump prime  solution 103 mg  2 mg/kg Intracatheter To OR Grace Isaac, MD      . vancomycin (VANCOCIN) IVPB 1000 mg/200 mL premix  1,000 mg Intravenous To OR Grace Isaac, MD          Review of Systems:    Cardiac Review of Systems: Y or  [    ]= no             Chest Pain [ Y   ]        Resting SOB [   ]         Exertional SOB  [Y  ]  Orthopnea [  ]             Pedal Edema [   ]        Palpitations Aqua.Slicker  ]         Syncope  [  ]    Presyncope [   ]             General Review of Systems: [Y] = yes [  ]=no Constitional: recent weight change [  ]; anorexia [  ]; fatigue [Y  ]; nausea [N]; night sweats [  ]; fever Aqua.Slicker  ]; or chills [  ]                                                               Dental: Last Dentist visit:              Eye : blurred vision [  ]; diplopia [   ]; vision changes [  ];  Amaurosis fugax[  ]; Resp: cough [  ];  wheezing[  ];  hemoptysis[  ]; shortness of breath[ Y ]; paroxysmal nocturnal dyspnea[  ]; dyspnea on exertion[Y  ]; or orthopnea[  ];  GI:  gallstones[  ], vomiting[  ];  dysphagia[  ]; melena[  ];  hematochezia [  ]; heartburn[  ];   Hx of  Colonoscopy[  ]; GU: kidney stones [  ]; hematuria[  ];   dysuria [Y, h/o cystocele  ];  nocturia[  ];  history of     obstruction [  ]; urinary frequency [  ]             Skin: rash, swelling[  ];, hair loss[  ];  peripheral edema[N  ];  or itching[  ]; Musculosketetal: myalgias[  ];  joint swelling[  ];  joint erythema[  ];  joint pain[  ];  back pain[  ];             Heme/Lymph: bruising[  ];  bleeding[  ];  anemia[  ];  Neuro: TIA[  ];  headaches[  ];  Stroke[Y. Patient states identified on scan?  ];  vertigo[  ];  seizures[  ];   paresthesias[  ];  difficulty walking[  ];             Psych:depression[  ]; anxiety[  ];             Endocrine: diabetes[ N ];  thyroid dysfunction[N  ];         PHYSICAL EXAMINATION: BP (!) 154/67   Pulse 84   Temp 98.3 F (36.8 C) (Oral)   Resp 18   Ht 5' 1.5" (1.562 m)   Wt 50.3  kg   SpO2 99%   BMI 20.63 kg/m  General appearance: alert, cooperative, appears stated age and no distress Neck: no adenopathy, no carotid bruit, no JVD, supple, symmetrical, trachea midline and thyroid not enlarged, symmetric, no tenderness/mass/nodules Lymph nodes: Cervical, supraclavicular, and axillary nodes normal. Resp: clear to auscultation bilaterally Cardio: regular rate and rhythm, S1, S2 normal, no murmur, click, rub or gallop GI: soft, non-tender; bowel sounds normal; no masses,  no organomegaly Extremities: extremities normal, atraumatic, no cyanosis or edema and Homans sign is negative, no sign of DVT Neurologic: Grossly normal  Diagnostic Studies & Laboratory data:     Recent Radiology Findings:  10/2019   Downsloping ST segment depression ST segment depression of 1 mm was noted during stress in the V4 and V5 leads, and returning to baseline after 5-9 minutes of recovery.  Defect 1: There is a medium defect of moderate severity present in the mid inferoseptal, mid inferior, mid inferolateral and apical inferior location. There is also a considerable amount of GI uptake with soft tissue attenuation likely playing a role.  Findings consistent with possible prior myocardial infarction with a very mild degree of peri-infarct ischemia.  This is a low risk study.  Nuclear stress EF: 55%.      I have independently reviewed the above radiology studies  and reviewed the findings with the patient.   Recent Lab Findings: Lab Results  Component Value Date   WBC 3.7 (L) 01/29/2020   HGB 8.6 (L) 01/29/2020   HCT 27.7 (L) 01/29/2020   PLT 222 01/29/2020   GLUCOSE 117 (H) 01/29/2020   CHOL 130 01/29/2020   TRIG 105 01/29/2020   HDL 38 (L) 01/29/2020   LDLCALC 71 01/29/2020   ALT 16 01/29/2020   AST 18 01/29/2020   NA 137 01/29/2020   K 3.8 01/29/2020   CL 103 01/29/2020   CREATININE 0.80 01/29/2020   BUN 21 01/29/2020   CO2 26 01/29/2020   TSH 1.644 Test methodology  is 3rd generation TSH 12/20/2007   INR 0.86 03/17/2014   HGBA1C 6.5 (H) 01/29/2020   CATH 01/2020 :  Conclusions: 1. Severe two-vessel coronary artery disease, including 70% proximal LAD stenosis involving large D1 branch, as well as heavily calcified 99% mid RCA stenosis with faint left-to-right collaterals.  There is mild diffuse disease involving the LMCA and LCx. 2. Normal left ventricular systolic function and filling pressure.  Recommendations: 1. As the patient experienced angina during the procedure and was found to have significant two-vessel coronary artery disease, we will admit her for medical optimization and cardiac surgery consultation for CABG.  If Ms. Cooley is not a surgical candidate, we could consider PCI to the LAD/D1, though she would need to be challenged with a P2Y12 inhibitor first given history of significant epistaxis in  the past while on clopidogrel.  I do not believe that the RCA stenosis is treatable at this time from a percutaneous approach, given heavy calcification and severe tortuosity that would make atherectomy difficult and high risk for dissection or perforation. 2. Aggressive secondary prevention.  We will check a lipid panel with AM labs to determine need for escalation of statin therapy. 3. Obtain echocardiogram.  Nelva Bush, MD [facility]   I have independently reviewed the above  cath films and reviewed the findings with the  patient .  ECHO- 03/01/20 IMPRESSIONS    1. Left ventricular ejection fraction, by estimation, is 55 to 60%. The  left ventricle has normal function. The left ventricle has no regional  wall motion abnormalities. There is mild left ventricular hypertrophy of  the basal-septal segment. Left  ventricular diastolic parameters are indeterminate.  2. Right ventricular systolic function is normal. The right ventricular  size is normal. There is normal pulmonary artery systolic pressure.  3. The mitral valve is normal  in structure. Mild mitral valve  regurgitation. No evidence of mitral stenosis.  4. The aortic valve is tricuspid. Aortic valve regurgitation is not  visualized. Mild to moderate aortic valve sclerosis/calcification is  present, without any evidence of aortic stenosis.  5. The inferior vena cava is normal in size with greater than 50%  respiratory variability, suggesting right atrial pressure of 3 mmHg.   Assessment / Plan:   #1 anginal chest pain significantly limiting the patient's activity, with high-grade right coronary artery stenosis and disease in the LAD diagonal-films have been reviewed by Dr. END , Dr. Irish Lack, and Dr. Burt Knack all agreed that atherectomy of the right coronary artery would be a very significant risk.  With the patient's ongoing chest pain with medical therapy I recommended to her that we proceed this week with coronary artery bypass grafting.  Risks and options were discussed with the patient and her daughter, both the risk of trying to continue with medical therapy and the risk of surgery.  The patient is willing to proceed with coronary artery bypass grafting.  Arrangements have been made in the office today to get preop labs Covid tests echocardiogram tomorrow and proceed with coronary artery bypass grafting on Wednesday morning.  #2 anemia with hemoglobin of 8.8 at [facility] internal medicine last week.-No obvious source of blood loss  #3 history of peripheral vascular disease and renal artery disease  #4 longstanding hypertension  The goals risks and alternatives of the planned surgical procedure Procedure(s): CORONARY ARTERY BYPASS GRAFTING (CABG) (N/A) TRANSESOPHAGEAL ECHOCARDIOGRAM (TEE) (N/A)  have been discussed with the patient in detail. The risks of the procedure including death, infection, stroke, myocardial infarction, bleeding, blood transfusion have all been discussed specifically.  I have quoted [name] a 2% of perioperative mortality and a  complication rate as high as 40 %. The patient's questions have been answered.JAELEE GEREN is willing  to proceed with the planned procedure.  Grace Isaac MD      [address] [place] [address] Office [phone]     03/02/2020 7:15 AM

## 2020-03-03 ENCOUNTER — Encounter (HOSPITAL_COMMUNITY): Payer: Self-pay | Admitting: Cardiothoracic Surgery

## 2020-03-03 ENCOUNTER — Inpatient Hospital Stay (HOSPITAL_COMMUNITY): Payer: Medicare Other

## 2020-03-03 LAB — CBC
HCT: 34.3 % — ABNORMAL LOW (ref 36.0–46.0)
HCT: 37.1 % (ref 36.0–46.0)
Hemoglobin: 11.3 g/dL — ABNORMAL LOW (ref 12.0–15.0)
Hemoglobin: 11.9 g/dL — ABNORMAL LOW (ref 12.0–15.0)
MCH: 27.2 pg (ref 26.0–34.0)
MCH: 26.7 pg (ref 26.0–34.0)
MCHC: 32.9 g/dL (ref 30.0–36.0)
MCHC: 32.1 g/dL (ref 30.0–36.0)
MCV: 82.7 fL (ref 80.0–100.0)
MCV: 83.4 fL (ref 80.0–100.0)
Platelets: 148 10*3/uL — ABNORMAL LOW (ref 150–400)
Platelets: 184 10*3/uL (ref 150–400)
RBC: 4.15 MIL/uL (ref 3.87–5.11)
RBC: 4.45 MIL/uL (ref 3.87–5.11)
RDW: 15 % (ref 11.5–15.5)
RDW: 15.6 % — ABNORMAL HIGH (ref 11.5–15.5)
WBC: 12 10*3/uL — ABNORMAL HIGH (ref 4.0–10.5)
WBC: 16 10*3/uL — ABNORMAL HIGH (ref 4.0–10.5)
nRBC: 0 % (ref 0.0–0.2)
nRBC: 0 % (ref 0.0–0.2)

## 2020-03-03 LAB — BPAM RBC
Blood Product Expiration Date: 202105012359
Blood Product Expiration Date: 202105152359
Blood Product Expiration Date: 202105182359
Blood Product Expiration Date: 202105182359
Blood Product Expiration Date: 202105222359
Blood Product Expiration Date: 202105222359
Blood Product Expiration Date: 202105222359
Blood Product Expiration Date: 202105252359
Blood Product Expiration Date: 202105252359
Blood Product Expiration Date: 202105252359
ISSUE DATE / TIME: 202104201349
ISSUE DATE / TIME: 202104210854
ISSUE DATE / TIME: 202104210854
ISSUE DATE / TIME: 202104211007
ISSUE DATE / TIME: 202104211007
ISSUE DATE / TIME: 202104211007
ISSUE DATE / TIME: 202104211007
Unit Type and Rh: 5100
Unit Type and Rh: 5100
Unit Type and Rh: 5100
Unit Type and Rh: 5100
Unit Type and Rh: 5100
Unit Type and Rh: 5100
Unit Type and Rh: 9500
Unit Type and Rh: 9500
Unit Type and Rh: 9500
Unit Type and Rh: 9500

## 2020-03-03 LAB — POCT I-STAT 7, (LYTES, BLD GAS, ICA,H+H)
Acid-base deficit: 3 mmol/L — ABNORMAL HIGH (ref 0.0–2.0)
Bicarbonate: 22.9 mmol/L (ref 20.0–28.0)
Calcium, Ion: 1.26 mmol/L (ref 1.15–1.40)
HCT: 30 % — ABNORMAL LOW (ref 36.0–46.0)
Hemoglobin: 10.2 g/dL — ABNORMAL LOW (ref 12.0–15.0)
O2 Saturation: 100 %
Patient temperature: 36.7
Potassium: 3.5 mmol/L (ref 3.5–5.1)
Sodium: 140 mmol/L (ref 135–145)
TCO2: 24 mmol/L (ref 22–32)
pCO2 arterial: 41.1 mmHg (ref 32.0–48.0)
pH, Arterial: 7.352 (ref 7.350–7.450)
pO2, Arterial: 175 mmHg — ABNORMAL HIGH (ref 83.0–108.0)

## 2020-03-03 LAB — TYPE AND SCREEN
ABO/RH(D): O NEG
Antibody Screen: NEGATIVE
Unit division: 0
Unit division: 0
Unit division: 0
Unit division: 0
Unit division: 0
Unit division: 0
Unit division: 0
Unit division: 0
Unit division: 0
Unit division: 0

## 2020-03-03 LAB — BASIC METABOLIC PANEL
Anion gap: 6 (ref 5–15)
Anion gap: 6 (ref 5–15)
BUN: 10 mg/dL (ref 8–23)
BUN: 12 mg/dL (ref 8–23)
CO2: 22 mmol/L (ref 22–32)
CO2: 24 mmol/L (ref 22–32)
Calcium: 8.6 mg/dL — ABNORMAL LOW (ref 8.9–10.3)
Calcium: 8.8 mg/dL — ABNORMAL LOW (ref 8.9–10.3)
Chloride: 109 mmol/L (ref 98–111)
Chloride: 103 mmol/L (ref 98–111)
Creatinine, Ser: 0.63 mg/dL (ref 0.44–1.00)
Creatinine, Ser: 0.77 mg/dL (ref 0.44–1.00)
GFR calc Af Amer: >60 mL/min (ref >60)
GFR calc Af Amer: >60 mL/min (ref >60)
GFR calc non Af Amer: >60 mL/min (ref >60)
GFR calc non Af Amer: >60 mL/min (ref >60)
Glucose, Bld: 157 mg/dL — ABNORMAL HIGH (ref 70–99)
Glucose, Bld: 194 mg/dL — ABNORMAL HIGH (ref 70–99)
Potassium: 4.1 mmol/L (ref 3.5–5.1)
Potassium: 3.8 mmol/L (ref 3.5–5.1)
Sodium: 137 mmol/L (ref 135–145)
Sodium: 133 mmol/L — ABNORMAL LOW (ref 135–145)

## 2020-03-03 LAB — GLUCOSE, CAPILLARY
Glucose-Capillary: 132 mg/dL — ABNORMAL HIGH (ref 70–99)
Glucose-Capillary: 133 mg/dL — ABNORMAL HIGH (ref 70–99)
Glucose-Capillary: 144 mg/dL — ABNORMAL HIGH (ref 70–99)
Glucose-Capillary: 145 mg/dL — ABNORMAL HIGH (ref 70–99)
Glucose-Capillary: 151 mg/dL — ABNORMAL HIGH (ref 70–99)
Glucose-Capillary: 154 mg/dL — ABNORMAL HIGH (ref 70–99)
Glucose-Capillary: 154 mg/dL — ABNORMAL HIGH (ref 70–99)
Glucose-Capillary: 158 mg/dL — ABNORMAL HIGH (ref 70–99)
Glucose-Capillary: 160 mg/dL — ABNORMAL HIGH (ref 70–99)
Glucose-Capillary: 163 mg/dL — ABNORMAL HIGH (ref 70–99)
Glucose-Capillary: 168 mg/dL — ABNORMAL HIGH (ref 70–99)
Glucose-Capillary: 177 mg/dL — ABNORMAL HIGH (ref 70–99)

## 2020-03-03 LAB — ECHO INTRAOPERATIVE TEE
Height: 61.5 in
Weight: 1776 oz

## 2020-03-03 LAB — MAGNESIUM
Magnesium: 2.8 mg/dL — ABNORMAL HIGH (ref 1.7–2.4)
Magnesium: 2.5 mg/dL — ABNORMAL HIGH (ref 1.7–2.4)

## 2020-03-03 MED ORDER — INSULIN ASPART 100 UNIT/ML ~~LOC~~ SOLN
0.0000 [IU] | SUBCUTANEOUS | Status: DC
  Administered 2020-03-03 – 2020-03-04 (×6): 2 [IU] via SUBCUTANEOUS
  Administered 2020-03-04: 13:00:00 8 [IU] via SUBCUTANEOUS
  Administered 2020-03-04 – 2020-03-05 (×5): 2 [IU] via SUBCUTANEOUS
  Administered 2020-03-05 (×2): 4 [IU] via SUBCUTANEOUS
  Administered 2020-03-06: 2 [IU] via SUBCUTANEOUS

## 2020-03-03 MED ORDER — ENOXAPARIN SODIUM 30 MG/0.3ML ~~LOC~~ SOLN
30.0000 mg | Freq: Every day | SUBCUTANEOUS | Status: DC
  Administered 2020-03-03 – 2020-03-05 (×3): 30 mg via SUBCUTANEOUS
  Filled 2020-03-03 (×3): qty 0.3

## 2020-03-03 MED ORDER — FUROSEMIDE 10 MG/ML IJ SOLN
40.0000 mg | Freq: Once | INTRAMUSCULAR | Status: AC
  Administered 2020-03-03: 40 mg via INTRAVENOUS
  Filled 2020-03-03: qty 4

## 2020-03-03 MED ORDER — POTASSIUM CHLORIDE 20 MEQ/15ML (10%) PO SOLN
20.0000 meq | Freq: Every day | ORAL | Status: AC
  Administered 2020-03-03: 20 meq via ORAL
  Filled 2020-03-03: qty 15

## 2020-03-03 MED ORDER — PNEUMOCOCCAL VAC POLYVALENT 25 MCG/0.5ML IJ INJ: 0.5000 mL | INJECTION | INTRAMUSCULAR | Status: DC

## 2020-03-03 MED ORDER — METOCLOPRAMIDE HCL 5 MG/ML IJ SOLN
10.0000 mg | Freq: Four times a day (QID) | INTRAMUSCULAR | Status: AC
  Administered 2020-03-03 – 2020-03-04 (×4): 10 mg via INTRAVENOUS
  Filled 2020-03-03 (×4): qty 2

## 2020-03-03 NOTE — Evaluation (Signed)
Physical Therapy Evaluation Patient Details Name: [name] MRN: [id number] DOB: [birth year] Today's Date: 03/03/2020   History of Present Illness  Pt adm for CABG x 3 on 03/02/20. PMH - HTN, CAD, lt CEA, atherosclerotic ulcer of abdominal aorta with stent graft, renal artery stenosis with stents.   Clinical Impression  Pt presents to PT with decr mobility after CABG. Expect pt will make excellent progress and will return to independent mobility. Asked pt if daughter could come stay with her a few days after DC and pt was not sure.     Follow Up Recommendations No PT follow up;Supervision - Intermittent    Equipment Recommendations  Other (comment)(TBD - possible rollator)    Recommendations for Other Services       Precautions / Restrictions Precautions Precautions: Sternal Restrictions Weight Bearing Restrictions: Yes(sternal)      Mobility  Bed Mobility               General bed mobility comments: Pt up in chair  Transfers Overall transfer level: Needs assistance Equipment used: 4-wheeled walker Transfers: Sit to/from Stand Sit to Stand: Min guard         General transfer comment: Assist for lines/safety. Verbal cues for hand placement  Ambulation/Gait Ambulation/Gait assistance: Min guard Gait Distance (Feet): 40 Feet Assistive device: 4-wheeled walker Gait Pattern/deviations: Step-through pattern;Decreased stride length Gait velocity: decr Gait velocity interpretation: 1.31 - 2.62 ft/sec, indicative of limited community ambulator General Gait Details: Assist for lines and safety. Pt requested return to room due to pain.   Stairs            Wheelchair Mobility    Modified Rankin (Stroke Patients Only)       Balance Overall balance assessment: Mild deficits observed, not formally tested                                           Pertinent Vitals/Pain Pain Assessment: 0-10 Pain Score: 10-Worst pain ever Pain  Location: chest and lt subscapular pain Pain Descriptors / Indicators: Grimacing;Guarding Pain Intervention(s): Limited activity within patient's tolerance;Monitored during session;Repositioned;Premedicated before session    Home Living Family/patient expects to be discharged to:: Private residence Living Arrangements: Alone Available Help at Discharge: Family;Available PRN/intermittently(daughter) Type of Home: House Home Access: Stairs to enter Entrance Stairs-Rails: Right Entrance Stairs-Number of Steps: 4 Home Layout: One level Home Equipment: None      Prior Function Level of Independence: Independent               Hand Dominance        Extremity/Trunk Assessment   Upper Extremity Assessment Upper Extremity Assessment: Defer to OT evaluation    Lower Extremity Assessment Lower Extremity Assessment: Generalized weakness       Communication   Communication: No difficulties  Cognition Arousal/Alertness: Awake/alert Behavior During Therapy: Flat affect Overall Cognitive Status: Within Functional Limits for tasks assessed                                 General Comments: Seems depressed      General Comments General comments (skin integrity, edema, etc.): VSS on RA.     Exercises     Assessment/Plan    PT Assessment Patient needs continued PT services  PT Problem List Decreased strength;Decreased activity tolerance;Decreased mobility;Decreased knowledge of use  of DME;Decreased knowledge of precautions;Pain       PT Treatment Interventions DME instruction;Gait training;Stair training;Functional mobility training;Therapeutic activities;Therapeutic exercise;Patient/family education    PT Goals (Current goals can be found in the Care Plan section)  Acute Rehab PT Goals Patient Stated Goal: return home PT Goal Formulation: With patient Time For Goal Achievement: 03/17/20 Potential to Achieve Goals: Good    Frequency Min 3X/week    Barriers to discharge Decreased caregiver support;Inaccessible home environment Lives alone. Stairs to enter    Co-evaluation               AM-PAC PT "6 Clicks" Mobility  Outcome Measure Help needed turning from your back to your side while in a flat bed without using bedrails?: A Little Help needed moving from lying on your back to sitting on the side of a flat bed without using bedrails?: A Little Help needed moving to and from a bed to a chair (including a wheelchair)?: A Little Help needed standing up from a chair using your arms (e.g., wheelchair or bedside chair)?: A Little Help needed to walk in hospital room?: A Little Help needed climbing 3-5 steps with a railing? : A Little 6 Click Score: 18    End of Session   Activity Tolerance: Patient limited by pain Patient left: in chair;with call bell/phone within reach Nurse Communication: Mobility status PT Visit Diagnosis: Other abnormalities of gait and mobility (R26.89);Pain Pain - part of body: (chest and lt subscapular)    Time: [id number] PT Time Calculation (min) (ACUTE ONLY): 15 min   Charges:   PT Evaluation $PT Eval Moderate Complexity: [place] Pager [phone] Office [place] 03/03/2020, 10:35 AM

## 2020-03-03 NOTE — Progress Notes (Signed)
EVENING ROUNDS NOTE :     [address]       [place] [address]             [phone]                 1 Day Post-Op Procedure(s) (LRB): CORONARY ARTERY BYPASS GRAFTING (CABG)x 3, LIMA TO LAD, SVG TO DIAGONAL, SVG TO RCA, WITH OPEN HARVESTING OF LEFT LOWER GREATER SAPHENOUS VEIN AND RIGHT AND LEFT GREATER SAPHENOUS VEIN HARVESTED ENDOSCOPICALLY (N/A) TRANSESOPHAGEAL ECHOCARDIOGRAM (TEE) (N/A) Endovein Harvest Of Greater Saphenous Vein (Bilateral)   Total Length of Stay:  LOS: 1 day  Events:  Continues to have some nausea Starting aroma therapy Otherwise stable    BP 131/71   Pulse 81   Temp (!) 97.2 F (36.2 C) (Oral)   Resp 19   Ht 5' 1.5" (1.562 m)   Wt 52 kg   SpO2 92%   BMI 21.31 kg/m   PAP: (22-38)/(8-18) 31/13 CO:  [3.4 L/min-5.8 L/min] 3.4 L/min CI:  [2.3 L/min/m2-3.9 L/min/m2] 2.3 L/min/m2  Vent Mode: CPAP;PSV FiO2 (%):  [40 %-50 %] 40 % Set Rate:  [4 bmp-12 bmp] 4 bmp Vt Set:  [430 mL] 430 mL PEEP:  [5 cmH20] 5 cmH20 Pressure Support:  [5 cmH20-10 cmH20] 5 cmH20  . sodium chloride Stopped (03/03/20 0805)  . sodium chloride    . sodium chloride 10 mL/hr at 03/02/20 1630  . dexmedetomidine (PRECEDEX) IV infusion Stopped (03/03/20 0503)  . insulin Stopped (03/03/20 1208)  . lactated ringers    . lactated ringers    . lactated ringers Stopped (03/03/20 0509)  . nitroGLYCERIN Stopped (03/03/20 0405)  . phenylephrine (NEO-SYNEPHRINE) Adult infusion Stopped (03/02/20 1536)    I/O last 3 completed shifts: In: 7892.4 [I.V.:4815.2; Blood:1130; NG/GT:60; IV Piggyback:1887.3] Out: 4280 [Urine:3930; Chest Tube:350]   CBC Latest Ref Rng & Units 03/03/2020 03/03/2020 03/02/2020  WBC 4.0 - 10.5 K/uL 12.0(H) - 13.5(H)  Hemoglobin 12.0 - 15.0 g/dL 11.3(L) 10.2(L) 11.5(L)  Hematocrit 36.0 - 46.0 % 34.3(L) 30.0(L) 35.2(L)  Platelets 150 - 400 K/uL 148(L) - 168    BMP Latest Ref Rng & Units 03/03/2020 03/03/2020 03/02/2020  Glucose 70 - 99 mg/dL  157(H) - 166(H)  BUN 8 - 23 mg/dL 10 - 9  Creatinine 0.44 - 1.00 mg/dL 0.63 - 0.61  BUN/Creat Ratio 6 - 22 (calc) - - -  Sodium 135 - 145 mmol/L 137 140 137  Potassium 3.5 - 5.1 mmol/L 4.1 3.5 4.0  Chloride 98 - 111 mmol/L 109 - 109  CO2 22 - 32 mmol/L 22 - 22  Calcium 8.9 - 10.3 mg/dL 8.6(L) - 8.4(L)    ABG    Component Value Date/Time   PHART 7.352 03/03/2020 0025   PCO2ART 41.1 03/03/2020 0025   PO2ART 175 (H) 03/03/2020 0025   HCO3 22.9 03/03/2020 0025   TCO2 24 03/03/2020 0025   ACIDBASEDEF 3.0 (H) 03/03/2020 0025   O2SAT 100.0 03/03/2020 0025       Melodie Bouillon, MD 03/03/2020 5:04 PM

## 2020-03-03 NOTE — Plan of Care (Signed)
  Problem: Education: Goal: Will demonstrate proper wound care and an understanding of methods to prevent future damage Outcome: Progressing Goal: Knowledge of disease or condition will improve Outcome: Progressing Goal: Knowledge of the prescribed therapeutic regimen will improve Outcome: Progressing Goal: Individualized Educational Video(s) Outcome: Progressing   Problem: Activity: Goal: Risk for activity intolerance will decrease Outcome: Progressing   Problem: Cardiac: Goal: Will achieve and/or maintain hemodynamic stability Outcome: Progressing   Problem: Clinical Measurements: Goal: Postoperative complications will be avoided or minimized Outcome: Progressing   Problem: Skin Integrity: Goal: Wound healing without signs and symptoms of infection Outcome: Progressing Goal: Risk for impaired skin integrity will decrease Outcome: Progressing   Problem: Urinary Elimination: Goal: Ability to achieve and maintain adequate renal perfusion and functioning will improve Outcome: Progressing

## 2020-03-03 NOTE — Progress Notes (Signed)
[address]       [place] [address]             [phone]      1 Day Post-Op Procedure(s) (LRB): CORONARY ARTERY BYPASS GRAFTING (CABG)x 3, LIMA TO LAD, SVG TO DIAGONAL, SVG TO RCA, WITH OPEN HARVESTING OF LEFT LOWER GREATER SAPHENOUS VEIN AND RIGHT AND LEFT GREATER SAPHENOUS VEIN HARVESTED ENDOSCOPICALLY (N/A) TRANSESOPHAGEAL ECHOCARDIOGRAM (TEE) (N/A) Endovein Harvest Of Greater Saphenous Vein (Bilateral) Subjective: Nausea today and hot flashes.   Objective: Vital signs in last 24 hours: Temp:  [95.5 F (35.3 C)-99 F (37.2 C)] 97.4 F (36.3 C) (04/22 1145) Pulse Rate:  [75-109] 83 (04/22 1145) Cardiac Rhythm: Normal sinus rhythm (04/22 1230) Resp:  [0-25] 19 (04/22 1145) BP: (90-166)/(49-86) 146/78 (04/22 1145) SpO2:  [93 %-100 %] 94 % (04/22 1145) Arterial Line BP: (75-170)/(41-97) 86/83 (04/22 0800) FiO2 (%):  [40 %-50 %] 40 % (04/21 2032) Weight:  [52 kg] 52 kg (04/22 0500)  Hemodynamic parameters for last 24 hours: PAP: (22-39)/(8-18) 31/13 CO:  [3 L/min-5.8 L/min] 3.4 L/min CI:  [2 L/min/m2-3.9 L/min/m2] 2.3 L/min/m2  Intake/Output from previous day: 04/21 0701 - 04/22 0700 In: 7892.4 [I.V.:4815.2; Blood:1130; NG/GT:60; IV Piggyback:1887.3] Out: 4280 [Urine:3930; Chest Tube:350] Intake/Output this shift: Total I/O In: 71.5 [I.V.:29.7; IV Piggyback:41.8] Out: 1010 [Urine:850; Chest Tube:160]  General appearance: alert, cooperative and no distress Heart: regular rate and rhythm, S1, S2 normal, no murmur, click, rub or gallop Lungs: clear to auscultation bilaterally Abdomen: diminished bowel sounds Extremities: extremities normal, atraumatic, no cyanosis or edema Wound: clean and dry, covered with a sterile dressing  Lab Results: Recent Labs    03/02/20 2122 03/02/20 2122 03/03/20 0025 03/03/20 0348  WBC 13.5*  --   --  12.0*  HGB 11.5*   < > 10.2* 11.3*  HCT 35.2*   < > 30.0* 34.3*  PLT 168  --   --  148*   < > = values  in this interval not displayed.   BMET:  Recent Labs    03/02/20 2122 03/02/20 2122 03/03/20 0025 03/03/20 0348  NA 137   < > 140 137  K 4.0   < > 3.5 4.1  CL 109  --   --  109  CO2 22  --   --  22  GLUCOSE 166*  --   --  157*  BUN 9  --   --  10  CREATININE 0.61  --   --  0.63  CALCIUM 8.4*  --   --  8.6*   < > = values in this interval not displayed.    PT/INR:  Recent Labs    03/02/20 1533  LABPROT 17.4*  INR 1.4*   ABG    Component Value Date/Time   PHART 7.352 03/03/2020 0025   HCO3 22.9 03/03/2020 0025   TCO2 24 03/03/2020 0025   ACIDBASEDEF 3.0 (H) 03/03/2020 0025   O2SAT 100.0 03/03/2020 0025   CBG (last 3)  Recent Labs    03/03/20 0937 03/03/20 1052 03/03/20 1155  GLUCAP 151* 163* 158*    Assessment/Plan: S/P Procedure(s) (LRB): CORONARY ARTERY BYPASS GRAFTING (CABG)x 3, LIMA TO LAD, SVG TO DIAGONAL, SVG TO RCA, WITH OPEN HARVESTING OF LEFT LOWER GREATER SAPHENOUS VEIN AND RIGHT AND LEFT GREATER SAPHENOUS VEIN HARVESTED ENDOSCOPICALLY (N/A) TRANSESOPHAGEAL ECHOCARDIOGRAM (TEE) (N/A) Endovein Harvest Of Greater Saphenous Vein (Bilateral)  1. CV-No drips, BP well controlled. NSR in the 80s. Continue asa,  bb and statin 2. Pulm-Tolerating 4L [place] with good oxygen saturation. Wean as able.  3. Renal-creatinine 0.63, electrolytes okay 4. H and H 11.3/34.3, expected acute blood loss anemia 5. Endo-blood glucose well controlled. Transition from Insulin gtt to ssi coverage.  6. Nausea is her biggest complaint. Zofran isn't working. Will try compazine. Trying to avoid Phenergan.   Plan: One chest tube already removed. Working on avoiding narcotics due to nausea.      LOS: 1 day    [name] 03/03/2020  I have seen and examined [name] and agree with the above assessment  and plan.  Grace Isaac MD Beeper [phone] Office [phone] 03/03/2020 5:19 PM

## 2020-03-04 ENCOUNTER — Inpatient Hospital Stay (HOSPITAL_COMMUNITY): Payer: Medicare Other

## 2020-03-04 LAB — POCT I-STAT, CHEM 8
BUN: 14 mg/dL (ref 8–23)
BUN: 12 mg/dL (ref 8–23)
BUN: 9 mg/dL (ref 8–23)
BUN: 10 mg/dL (ref 8–23)
BUN: 10 mg/dL (ref 8–23)
BUN: 9 mg/dL (ref 8–23)
Calcium, Ion: 1.37 mmol/L (ref 1.15–1.40)
Calcium, Ion: 1 mmol/L — ABNORMAL LOW (ref 1.15–1.40)
Calcium, Ion: 1.33 mmol/L (ref 1.15–1.40)
Calcium, Ion: 1.17 mmol/L (ref 1.15–1.40)
Calcium, Ion: 1.21 mmol/L (ref 1.15–1.40)
Calcium, Ion: 1.16 mmol/L (ref 1.15–1.40)
Chloride: 101 mmol/L (ref 98–111)
Chloride: 100 mmol/L (ref 98–111)
Chloride: 103 mmol/L (ref 98–111)
Chloride: 101 mmol/L (ref 98–111)
Chloride: 102 mmol/L (ref 98–111)
Chloride: 101 mmol/L (ref 98–111)
Creatinine, Ser: 0.7 mg/dL (ref 0.44–1.00)
Creatinine, Ser: 0.4 mg/dL — ABNORMAL LOW (ref 0.44–1.00)
Creatinine, Ser: 0.5 mg/dL (ref 0.44–1.00)
Creatinine, Ser: 0.4 mg/dL — ABNORMAL LOW (ref 0.44–1.00)
Creatinine, Ser: 0.5 mg/dL (ref 0.44–1.00)
Creatinine, Ser: 0.4 mg/dL — ABNORMAL LOW (ref 0.44–1.00)
Glucose, Bld: 211 mg/dL — ABNORMAL HIGH (ref 70–99)
Glucose, Bld: 114 mg/dL — ABNORMAL HIGH (ref 70–99)
Glucose, Bld: 114 mg/dL — ABNORMAL HIGH (ref 70–99)
Glucose, Bld: 103 mg/dL — ABNORMAL HIGH (ref 70–99)
Glucose, Bld: 116 mg/dL — ABNORMAL HIGH (ref 70–99)
Glucose, Bld: 107 mg/dL — ABNORMAL HIGH (ref 70–99)
HCT: 18 % — ABNORMAL LOW (ref 36.0–46.0)
HCT: 24 % — ABNORMAL LOW (ref 36.0–46.0)
HCT: 25 % — ABNORMAL LOW (ref 36.0–46.0)
HCT: 26 % — ABNORMAL LOW (ref 36.0–46.0)
HCT: 26 % — ABNORMAL LOW (ref 36.0–46.0)
HCT: 26 % — ABNORMAL LOW (ref 36.0–46.0)
Hemoglobin: 6.1 g/dL — CL (ref 12.0–15.0)
Hemoglobin: 8.2 g/dL — ABNORMAL LOW (ref 12.0–15.0)
Hemoglobin: 8.5 g/dL — ABNORMAL LOW (ref 12.0–15.0)
Hemoglobin: 8.8 g/dL — ABNORMAL LOW (ref 12.0–15.0)
Hemoglobin: 8.8 g/dL — ABNORMAL LOW (ref 12.0–15.0)
Hemoglobin: 8.8 g/dL — ABNORMAL LOW (ref 12.0–15.0)
Potassium: 3.7 mmol/L (ref 3.5–5.1)
Potassium: 3.5 mmol/L (ref 3.5–5.1)
Potassium: 4.3 mmol/L (ref 3.5–5.1)
Potassium: 4.1 mmol/L (ref 3.5–5.1)
Potassium: 4.4 mmol/L (ref 3.5–5.1)
Potassium: 3.3 mmol/L — ABNORMAL LOW (ref 3.5–5.1)
Sodium: 132 mmol/L — ABNORMAL LOW (ref 135–145)
Sodium: 138 mmol/L (ref 135–145)
Sodium: 139 mmol/L (ref 135–145)
Sodium: 137 mmol/L (ref 135–145)
Sodium: 138 mmol/L (ref 135–145)
Sodium: 138 mmol/L (ref 135–145)
TCO2: 29 mmol/L (ref 22–32)
TCO2: 27 mmol/L (ref 22–32)
TCO2: 28 mmol/L (ref 22–32)
TCO2: 28 mmol/L (ref 22–32)
TCO2: 30 mmol/L (ref 22–32)
TCO2: 28 mmol/L (ref 22–32)

## 2020-03-04 LAB — POCT I-STAT 7, (LYTES, BLD GAS, ICA,H+H)
Acid-Base Excess: 0 mmol/L (ref 0.0–2.0)
Acid-Base Excess: 1 mmol/L (ref 0.0–2.0)
Acid-Base Excess: 2 mmol/L (ref 0.0–2.0)
Acid-Base Excess: 3 mmol/L — ABNORMAL HIGH (ref 0.0–2.0)
Bicarbonate: 23.4 mmol/L (ref 20.0–28.0)
Bicarbonate: 25.1 mmol/L (ref 20.0–28.0)
Bicarbonate: 27.4 mmol/L (ref 20.0–28.0)
Bicarbonate: 26 mmol/L (ref 20.0–28.0)
Calcium, Ion: 1.29 mmol/L (ref 1.15–1.40)
Calcium, Ion: 0.98 mmol/L — ABNORMAL LOW (ref 1.15–1.40)
Calcium, Ion: 1.14 mmol/L — ABNORMAL LOW (ref 1.15–1.40)
Calcium, Ion: 1.12 mmol/L — ABNORMAL LOW (ref 1.15–1.40)
HCT: 26 % — ABNORMAL LOW (ref 36.0–46.0)
HCT: 26 % — ABNORMAL LOW (ref 36.0–46.0)
HCT: 27 % — ABNORMAL LOW (ref 36.0–46.0)
HCT: 26 % — ABNORMAL LOW (ref 36.0–46.0)
Hemoglobin: 8.8 g/dL — ABNORMAL LOW (ref 12.0–15.0)
Hemoglobin: 8.8 g/dL — ABNORMAL LOW (ref 12.0–15.0)
Hemoglobin: 9.2 g/dL — ABNORMAL LOW (ref 12.0–15.0)
Hemoglobin: 8.8 g/dL — ABNORMAL LOW (ref 12.0–15.0)
O2 Saturation: 100 %
O2 Saturation: 100 %
O2 Saturation: 100 %
O2 Saturation: 100 %
Potassium: 3.8 mmol/L (ref 3.5–5.1)
Potassium: 4.3 mmol/L (ref 3.5–5.1)
Potassium: 4.4 mmol/L (ref 3.5–5.1)
Potassium: 3.3 mmol/L — ABNORMAL LOW (ref 3.5–5.1)
Sodium: 135 mmol/L (ref 135–145)
Sodium: 140 mmol/L (ref 135–145)
Sodium: 138 mmol/L (ref 135–145)
Sodium: 139 mmol/L (ref 135–145)
TCO2: 24 mmol/L (ref 22–32)
TCO2: 26 mmol/L (ref 22–32)
TCO2: 29 mmol/L (ref 22–32)
TCO2: 27 mmol/L (ref 22–32)
pCO2 arterial: 34.2 mmHg (ref 32.0–48.0)
pCO2 arterial: 36.6 mmHg (ref 32.0–48.0)
pCO2 arterial: 43.1 mmHg (ref 32.0–48.0)
pCO2 arterial: 33.2 mmHg (ref 32.0–48.0)
pH, Arterial: 7.444 (ref 7.350–7.450)
pH, Arterial: 7.445 (ref 7.350–7.450)
pH, Arterial: 7.411 (ref 7.350–7.450)
pH, Arterial: 7.502 — ABNORMAL HIGH (ref 7.350–7.450)
pO2, Arterial: 430 mmHg — ABNORMAL HIGH (ref 83.0–108.0)
pO2, Arterial: 378 mmHg — ABNORMAL HIGH (ref 83.0–108.0)
pO2, Arterial: 342 mmHg — ABNORMAL HIGH (ref 83.0–108.0)
pO2, Arterial: 338 mmHg — ABNORMAL HIGH (ref 83.0–108.0)

## 2020-03-04 LAB — BASIC METABOLIC PANEL
Anion gap: 6 (ref 5–15)
BUN: 16 mg/dL (ref 8–23)
CO2: 26 mmol/L (ref 22–32)
Calcium: 9.3 mg/dL (ref 8.9–10.3)
Chloride: 102 mmol/L (ref 98–111)
Creatinine, Ser: 0.94 mg/dL (ref 0.44–1.00)
GFR calc Af Amer: >60 mL/min (ref >60)
GFR calc non Af Amer: 57 mL/min — ABNORMAL LOW (ref >60)
Glucose, Bld: 129 mg/dL — ABNORMAL HIGH (ref 70–99)
Potassium: 4 mmol/L (ref 3.5–5.1)
Sodium: 134 mmol/L — ABNORMAL LOW (ref 135–145)

## 2020-03-04 LAB — GLUCOSE, CAPILLARY
Glucose-Capillary: 130 mg/dL — ABNORMAL HIGH (ref 70–99)
Glucose-Capillary: 131 mg/dL — ABNORMAL HIGH (ref 70–99)
Glucose-Capillary: 134 mg/dL — ABNORMAL HIGH (ref 70–99)
Glucose-Capillary: 138 mg/dL — ABNORMAL HIGH (ref 70–99)
Glucose-Capillary: 142 mg/dL — ABNORMAL HIGH (ref 70–99)
Glucose-Capillary: 145 mg/dL — ABNORMAL HIGH (ref 70–99)
Glucose-Capillary: 203 mg/dL — ABNORMAL HIGH (ref 70–99)

## 2020-03-04 LAB — CBC
HCT: 36.8 % (ref 36.0–46.0)
Hemoglobin: 11.9 g/dL — ABNORMAL LOW (ref 12.0–15.0)
MCH: 27.4 pg (ref 26.0–34.0)
MCHC: 32.3 g/dL (ref 30.0–36.0)
MCV: 84.6 fL (ref 80.0–100.0)
Platelets: 207 10*3/uL (ref 150–400)
RBC: 4.35 MIL/uL (ref 3.87–5.11)
RDW: 15.9 % — ABNORMAL HIGH (ref 11.5–15.5)
WBC: 15.3 10*3/uL — ABNORMAL HIGH (ref 4.0–10.5)
nRBC: 0 % (ref 0.0–0.2)

## 2020-03-04 MED ORDER — AMLODIPINE BESYLATE 5 MG PO TABS
2.5000 mg | ORAL_TABLET | Freq: Every day | ORAL | Status: DC
  Administered 2020-03-04 – 2020-03-07 (×4): 2.5 mg via ORAL
  Filled 2020-03-04 (×4): qty 1

## 2020-03-04 MED ORDER — SODIUM CHLORIDE 0.9% FLUSH
10.0000 mL | Freq: Two times a day (BID) | INTRAVENOUS | Status: DC
  Administered 2020-03-04 – 2020-03-06 (×4): 10 mL

## 2020-03-04 MED ORDER — FUROSEMIDE 10 MG/ML IJ SOLN
40.0000 mg | Freq: Once | INTRAMUSCULAR | Status: AC
  Administered 2020-03-04: 40 mg via INTRAVENOUS
  Filled 2020-03-04: qty 4

## 2020-03-04 MED ORDER — LABETALOL HCL 100 MG PO TABS
50.0000 mg | ORAL_TABLET | Freq: Two times a day (BID) | ORAL | Status: DC
  Administered 2020-03-04 – 2020-03-05 (×3): 50 mg via ORAL
  Filled 2020-03-04 (×3): qty 1

## 2020-03-04 MED ORDER — METOCLOPRAMIDE HCL 5 MG/ML IJ SOLN
10.0000 mg | Freq: Four times a day (QID) | INTRAMUSCULAR | Status: AC
  Administered 2020-03-04 – 2020-03-05 (×4): 10 mg via INTRAVENOUS
  Filled 2020-03-04 (×4): qty 2

## 2020-03-04 MED ORDER — SODIUM CHLORIDE 0.9% FLUSH: 10.0000 mL | INTRAVENOUS | Status: DC | PRN

## 2020-03-04 NOTE — Progress Notes (Addendum)
[address]       [place] [address]             [phone]      2 Days Post-Op Procedure(s) (LRB): CORONARY ARTERY BYPASS GRAFTING (CABG)x 3, LIMA TO LAD, SVG TO DIAGONAL, SVG TO RCA, WITH OPEN HARVESTING OF LEFT LOWER GREATER SAPHENOUS VEIN AND RIGHT AND LEFT GREATER SAPHENOUS VEIN HARVESTED ENDOSCOPICALLY (N/A) TRANSESOPHAGEAL ECHOCARDIOGRAM (TEE) (N/A) Endovein Harvest Of Greater Saphenous Vein (Bilateral) Subjective: Feels okay this morning. She is tired since she is not getting much sleep. Nausea is better.   Objective: Vital signs in last 24 hours: Temp:  [97.2 F (36.2 C)-98.1 F (36.7 C)] 98.1 F (36.7 C) (04/23 0730) Pulse Rate:  [78-95] 85 (04/23 0700) Cardiac Rhythm: Normal sinus rhythm (04/23 0400) Resp:  [14-21] 15 (04/23 0700) BP: (99-168)/(62-89) 123/71 (04/23 0700) SpO2:  [91 %-99 %] 95 % (04/23 0700) Weight:  [54.3 kg] 54.3 kg (04/23 0500)     Intake/Output from previous day: 04/22 0701 - 04/23 0700 In: 267.9 [I.V.:76.1; IV Piggyback:191.8] Out: 1975 [Urine:1695; Chest Tube:280] Intake/Output this shift: Total I/O In: -  Out: 43 [Urine:13; Chest Tube:30]  General appearance: alert and cooperative Heart: regular rate and rhythm, S1, S2 normal, no murmur, click, rub or gallop Lungs: clear to auscultation bilaterally Abdomen: soft, non-tender; bowel sounds normal; no masses,  no organomegaly Extremities: extremities normal, atraumatic, no cyanosis or edema Wound: clean and dry covered with a sterile dressing  Lab Results: Recent Labs    03/03/20 1714 03/04/20 0408  WBC 16.0* 15.3*  HGB 11.9* 11.9*  HCT 37.1 36.8  PLT 184 207   BMET:  Recent Labs    03/03/20 1714 03/04/20 0408  NA 133* 134*  K 3.8 4.0  CL 103 102  CO2 24 26  GLUCOSE 194* 129*  BUN 12 16  CREATININE 0.77 0.94  CALCIUM 8.8* 9.3    PT/INR:  Recent Labs    03/02/20 1533  LABPROT 17.4*  INR 1.4*   ABG    Component Value Date/Time   PHART  7.352 03/03/2020 0025   HCO3 22.9 03/03/2020 0025   TCO2 24 03/03/2020 0025   ACIDBASEDEF 3.0 (H) 03/03/2020 0025   O2SAT 100.0 03/03/2020 0025   CBG (last 3)  Recent Labs    03/04/20 0013 03/04/20 0409 03/04/20 0800  GLUCAP 131* 130* 142*    Assessment/Plan: S/P Procedure(s) (LRB): CORONARY ARTERY BYPASS GRAFTING (CABG)x 3, LIMA TO LAD, SVG TO DIAGONAL, SVG TO RCA, WITH OPEN HARVESTING OF LEFT LOWER GREATER SAPHENOUS VEIN AND RIGHT AND LEFT GREATER SAPHENOUS VEIN HARVESTED ENDOSCOPICALLY (N/A) TRANSESOPHAGEAL ECHOCARDIOGRAM (TEE) (N/A) Endovein Harvest Of Greater Saphenous Vein (Bilateral)   1. CV-Nitro started last night, added back home BP meds and weaning nitro. NSR in the 80s. Continue asa, bb and statin 2. Pulm-Tolerating 3L [place] with good oxygen saturation. Wean as able.  3. Renal-creatinine 0.63, electrolytes okay 4. H and H 11.9/36.8, expected acute blood loss anemia 5. Endo-blood glucose well controlled.  6. Nausea is improving. Avoid narcotics.   Plan: Remove chest tubes. Lasix IV x 1 for fluid overload. OOB to chair. Ambulate around the unit. Patient is making slow and steady progress.     LOS: 2 days    [name] 03/04/2020  Positive bowel sounds Slow progress, pt and ot have seen Poss step down tomorrow I have seen and examined [name] and agree with the above assessment  and plan.  Grace Isaac MD Beeper [phone] Office [phone] 03/04/2020 8:32 AM

## 2020-03-04 NOTE — Evaluation (Addendum)
Occupational Therapy Evaluation Patient Details Name: [name] MRN: [id number] DOB: 04/01/1936 Today's Date: 03/04/2020    History of Present Illness Pt adm for CABG x 3 on 03/02/20. PMH - HTN, CAD, lt CEA, atherosclerotic ulcer of abdominal aorta with stent graft, renal artery stenosis with stents.    Clinical Impression   PTA pt living alone independent for BADL/IADL. At time of eval, pt completing bed mobility at min guard- min A level to assist with BLEs back onto bed. Pt was able to complete transfer to [facility] with supervision level of assist. Education given on sternal precautions with posterior peri care, but pt was not successful with BM. Continued education with being "in the tube", ECS strategies, and cardiac warnings signs. Pt on 4L [place] throughout session with VSS. Pt overall with flat affect and needing verbal encouragement to engage in any activity. Daughter and pt asking about CIR, pt is not appropriate candidate at this time. No post acute OT needs anticipated, OT will continue to follow while admitted to progress BADL.    Follow Up Recommendations  No OT follow up;Supervision - Intermittent    Equipment Recommendations  None recommended by OT    Recommendations for Other Services       Precautions / Restrictions Precautions Precautions: Sternal Precaution Booklet Issued: No Precaution Comments: reviewed in context of BADL and "staying in the tube"      Mobility Bed Mobility Overal bed mobility: Needs Assistance Bed Mobility: Supine to Sit;Sit to Supine     Supine to sit: Min guard Sit to supine: Min assist   General bed mobility comments: min guard to exit bed, min A to help legs back onto bed  Transfers Overall transfer level: Needs assistance Equipment used: 1 person hand held assist Transfers: Sit to/from Stand Sit to Stand: Min guard         General transfer comment: Assist for lines/safety. Verbal cues for hand placement and to maintain sternal  precautions    Balance Overall balance assessment: Mild deficits observed, not formally tested                                         ADL either performed or assessed with clinical judgement   ADL Overall ADL's : Needs assistance/impaired Eating/Feeding: Set up;Sitting   Grooming: Set up;Sitting;Standing   Upper Body Bathing: Set up;Sitting;Adhering to UE precautions   Lower Body Bathing: Set up;Sitting/lateral leans;Sit to/from stand   Upper Body Dressing : Set up;Sitting;Adhering to UE precautions   Lower Body Dressing: Set up;Sitting/lateral leans;Sit to/from stand   Toilet Transfer: Supervision/safety;BSC;Stand-pivot Armed forces technical officer Details (indicate cue type and reason): pt transferred to [facility] without physical assist from staff, nly needing line management. Pt then transferred self back into bed when therapist had stepped out of room. Toileting- Clothing Manipulation and Hygiene: Set up;Sitting/lateral lean;Sit to/from stand   Tub/ Shower Transfer: Min guard;Ambulation;Shower seat;Walk-in shower   Functional mobility during ADLs: Min guard       Vision Patient Visual Report: No change from baseline       Perception     Praxis      Pertinent Vitals/Pain Pain Assessment: 0-10 Pain Score: 6  Pain Location: chest Pain Descriptors / Indicators: Sore Pain Intervention(s): Monitored during session     Hand Dominance     Extremity/Trunk Assessment Upper Extremity Assessment Upper Extremity Assessment: Overall WFL for tasks assessed  Lower Extremity Assessment Lower Extremity Assessment: Defer to PT evaluation       Communication Communication Communication: No difficulties   Cognition Arousal/Alertness: Awake/alert Behavior During Therapy: Flat affect Overall Cognitive Status: Within Functional Limits for tasks assessed                                 General Comments: Needs verbal encouragement for any activity    General Comments  Pt on 4L. SpO2 not reading during amb. When seated SpO2 94%.    Exercises     Shoulder Instructions      Home Living Family/patient expects to be discharged to:: Private residence   Available Help at Discharge: Family;Available PRN/intermittently(daughter) Type of Home: House Home Access: Stairs to enter [employer] of Steps: 4 Entrance Stairs-Rails: Right Home Layout: One level     Bathroom Shower/Tub: Occupational psychologist: Standard     Home Equipment: Shower seat          Prior Functioning/Environment Level of Independence: Independent                 OT Problem List: Decreased knowledge of use of DME or AE;Decreased knowledge of precautions;Decreased activity tolerance;Cardiopulmonary status limiting activity;Impaired UE functional use;Pain      OT Treatment/Interventions: Self-care/ADL training;Therapeutic exercise;Patient/family education;Energy conservation;Therapeutic activities;DME and/or AE instruction    OT Goals(Current goals can be found in the care plan section) Acute Rehab OT Goals Patient Stated Goal: return home OT Goal Formulation: With patient Time For Goal Achievement: 03/18/20 Potential to Achieve Goals: Good  OT Frequency: Min 2X/week   Barriers to D/C:            Co-evaluation              AM-PAC OT "6 Clicks" Daily Activity     Outcome Measure Help from another person eating meals?: None Help from another person taking care of personal grooming?: A Little Help from another person toileting, which includes using toliet, bedpan, or urinal?: A Little Help from another person bathing (including washing, rinsing, drying)?: A Little Help from another person to put on and taking off regular upper body clothing?: A Little Help from another person to put on and taking off regular lower body clothing?: A Little 6 Click Score: 19   End of Session Nurse Communication: Mobility  status  Activity Tolerance: Patient tolerated treatment well Patient left: in bed;with call bell/phone within reach;with family/visitor present  OT Visit Diagnosis: Unsteadiness on feet (R26.81);Other abnormalities of gait and mobility (R26.89);Pain Pain - part of body: (chest incision)                Time: [id number] OT Time Calculation (min): 26 min Charges:  OT General Charges $OT Visit: 1 Visit OT Evaluation $OT Eval Moderate Complexity: 1 Mod OT Treatments $Self Care/Home Management : 8-22 mins  Zenovia Jarred, MSOT, OTR/L Acute Rehabilitation Services [facility] Office Number: [phone] Pager: [phone]  Zenovia Jarred 03/04/2020, 4:49 PM

## 2020-03-04 NOTE — Discharge Summary (Addendum)
[address]       [place] [address]             [phone]      Physician Discharge Summary  Patient ID: [name] MRN: [id number] DOB/AGE: 1936-03-06 84 y.o.  Admit date: 03/02/2020 Discharge date: 03/09/2020  Admission Diagnoses:  Patient Active Problem List   Diagnosis Date Noted  . Coronary artery disease 03/02/2020  . Intermediate stage nonexudative age-related macular degeneration of both eyes 02/16/2020  . Serous detachment of retinal pigment epithelium of left eye 02/16/2020  . Pseudophakia 02/16/2020  . Posterior vitreous detachment of both eyes 02/16/2020  . Accelerating angina ([place]) 01/28/2020  . Headache 02/07/2016  . RAS (renal artery stenosis) ([place]) 03/17/2014  . AAA (abdominal aortic aneurysm) ([place]) 11/28/2012  . Atherosclerosis of renal artery ([place]) 11/28/2012  . Observation for suspected cardiovascular disease 11/28/2012  . Essential (primary) hypertension 11/28/2012  . Peripheral blood vessel disorder ([place]) 11/28/2012  . Renal artery stenosis ([place])   . Essential hypertension, benign   . Claudication ([place])   . Penetrating atherosclerotic ulcer of aorta ([place])   . Carotid artery disease ([place]) 02/27/2011  . Arterial disease ([place]) 02/27/2011  . DYSLIPIDEMIA 04/03/2010  . HLD (hyperlipidemia) 04/03/2010  . Atherosclerosis of native arteries of extremity with intermittent claudication ([place]) 08/29/2009  . CAD (coronary artery disease), native coronary artery 09/14/2008  . Ectopic atrial tachycardia ([place]) 09/14/2008  . Benign hypertensive heart disease 09/14/2008  . Chronic tension type headache 09/14/2008  . Generalized anxiety disorder 09/14/2008  . Fast heart beat 09/14/2008    Discharge Diagnoses:  Active Problems:   Coronary artery disease   Discharged Condition: good  HPI:   [name] 84 y.o. female is seen in the office two days ago  for after she had called the cardiology office because of increasing chest  pain.  The patient was  seen in March 2021 while in the hospital  to consider coronary artery bypass grafting for severe two-vessel coronary artery disease, with a highly calcified complex tortuous right coronary artery that was not felt suitable for stenting or atherectomy.  Previously discussed with the patient the option of coronary artery bypass grafting on her hospitalization in March, at that time she did not wish to proceed with coronary artery bypass graft.  Since home she notes continued anginal chest pain and low levels of activity, his usual activity around the house.  Patient has a PMH of HTN, Hyperlipidemia, Penetrating, atherosclerotic Ulcer of Abdominal Aorta with stent graft, history of Renal artery stenosis with stents, H/O left carotid endarterectomy, claudication, and Ectopic Atrial Tachycardia. The patient presented with a 6 month history of chest pain in march 2021.   At that time her symptoms occurred with minimal exertion and usually relieves with rest. She states that she currently has not been active because she cant even walk to the mailbox without developing chest pain and shortness of breath. She is unable to clean her house without stopping multiple times.   She had a stress test in December 2020 which showed a likely prior MI and a mild degree of peri-infarct ischemia. Her EF was preserved. She was evaluated by Dr. Domenic [name] who recommended cardiac catheterization in January 2021, however the patient initially declined and wished to speak with her daughter. They spoke again via virtual visit in February at which time her symptoms persisted despite adjustment of medications.  Her cath w by Dr. Saunders [name] in March  and showed 2 vessel CAD.  Hospital Course:   [name] underwent a coronary artery bypass grafting x 3 with Dr. Servando [name]. The patient tolerated the procedure well and was transferred to the surgical ICU for continued care. She was extubated in a timely manner. She  got one of her chest tubes pulled POD 1. Her biggest issue was nausea, which she was on several medication to assist with. She remained hemodynamically stable and in NSR. POD 2 overall she was doing well. She was having some trouble sleeping which is extremely hard to do in the ICU. We removed her remaining chest tubes and foley catheter. We gave her s dose of IV lasix for expected fluid overload. We consulted physical therapy to assist with debility and leave their recommendations. She was stable to transfer to the telemetry unit on 4/25. On the floor, she continued to progress. She was weaned off her supplemental oxygen. She stated that it was hard to swallow therefore an SLP swallow study was ordered and showed normal swallow function. She was walking about 350 feet therefore PT/OT suggested home health with PT and RN for discharge. She continued to progress. She had some right shoulder pain which was thought to be brachial plexus syndrome which improved during her hospital stay. Today, she is ambulating with limited assistance, tolerating room air, her incision is healing well, and she is ready for discharge home.   Consults: physical therapy  Significant Diagnostic Studies:   CLINICAL DATA:  Bypass surgery.  EXAM: PORTABLE CHEST 1 VIEW  COMPARISON:  03/03/2020  FINDINGS: The right IJ Swan-Ganz catheter has been removed. The right IJ Cordis is still in place.  Stable left-sided chest tube without definite pneumothorax.  Persistent small bilateral pleural effusions with overlying bibasilar atelectasis. No edema.  IMPRESSION: 1. Removal of Swan-Ganz catheter. 2. Stable left-sided chest tube without pneumothorax. 3. Persistent small effusions and bibasilar atelectasis.   Electronically Signed   By: [name] M.D.   On: 03/04/2020 08:40  Treatments:   NAME: [name] MEDICAL RECORD [id number] ACCOUNT [id number] DATE OF BIRTH:1936-08-09 FACILITY[facility] LOCATION: [facility] PHYSICIAN:[name] Maryruth Bun, MD  OPERATIVE REPORT  DATE OF PROCEDURE:  03/02/2020  PREOPERATIVE DIAGNOSES:  Unstable angina with severe 2-vessel coronary artery disease.  POSTOPERATIVE DIAGNOSES:  Unstable angina with severe 2-vessel coronary artery disease.  SURGICAL PROCEDURE:  Coronary artery bypass grafting x3 with the left internal mammary artery to the left anterior descending coronary artery, reverse saphenous vein graft to the distal right coronary artery, reverse saphenous vein graft to the first  diagonal coronary artery with endoscopic vein harvesting, right and left greater saphenous veins, thigh open vein harvesting, left greater saphenous vein lower leg.  SURGEON:  Lanelle Bal, MD  FIRST ASSISTANT:  Lars Pinks PA.   Discharge Exam: Blood pressure (!) 173/84, pulse 95, temperature 97.6 F (36.4 C), temperature source Oral, resp. rate 18, height 5' 1.5" (1.562 m), weight 51 kg, SpO2 96 %.   General appearance: alert, cooperative and no distress Heart: regular rate and rhythm, S1, S2 normal, no murmur, click, rub or gallop Lungs: clear to auscultation bilaterally Abdomen: soft, non-tender; bowel sounds normal; no masses,  no organomegaly Extremities: extremities normal, atraumatic, no cyanosis or edema Wound: clean and dry   Disposition: Discharge disposition: 01-Home or Self Care        Allergies as of 03/09/2020      Reactions   Cefixime Shortness Of Breath   Patient is not familiar with this listed  allergy   Conjugated Estrogens Other (See Comments)   REACTION: flushing   Morphine And Related Other (See Comments)   Morphine injection Neck pain   Codeine Other (See Comments)   REACTION: dizziness   Iohexol     Desc: pt has anxiety and an irregular heartbeat. contrast exacerbates this. pt was very uncomfortable after a 20cc bolus for an miroi. we did w/o iv 11/09 per dr. Kris Hartmann. pt will talk w/ md about this and  will probably not get iv contrast in the future at her re, Onset Date: [id number]   Adenosine Other (See Comments)   Elevated heart rate when doing stress test      Medication List    STOP taking these medications   cloNIDine 0.1 MG tablet Commonly known as: CATAPRES   isosorbide mononitrate 60 MG 24 hr tablet Commonly known as: IMDUR   labetalol 100 MG tablet Commonly known as: NORMODYNE     TAKE these medications   acetaminophen 325 MG tablet Commonly known as: TYLENOL Take 325 mg by mouth every 6 (six) hours as needed. For pain.   amiodarone 200 MG tablet Commonly known as: Pacerone Take 1 tablet (200 mg total) by mouth 2 (two) times daily.   amLODipine 5 MG tablet Commonly known as: NORVASC Take 1 tablet (5 mg total) by mouth daily. What changed:   medication strength  how much to take  when to take this   aspirin 325 MG EC tablet Take 1 tablet (325 mg total) by mouth daily. What changed:   medication strength  how much to take  when to take this   clonazePAM 0.5 MG tablet Commonly known as: KLONOPIN Take 0.25 mg by mouth at bedtime as needed for anxiety.   metoprolol tartrate 50 MG tablet Commonly known as: LOPRESSOR Take 1 tablet (50 mg total) by mouth 2 (two) times daily.   pantoprazole 40 MG tablet Commonly known as: PROTONIX Take 40 mg by mouth daily.   polyethylene glycol powder 17 GM/SCOOP powder Commonly known as: GLYCOLAX/MIRALAX Take 17 g by mouth 2 (two) times a week. As needed   PreserVision/Lutein Caps Take 1 capsule by mouth 2 (two) times daily.   rosuvastatin 20 MG tablet Commonly known as: CRESTOR Take 1 tablet (20 mg total) by mouth at bedtime.   traMADol 50 MG tablet Commonly known as: ULTRAM Take 1 tablet (50 mg total) by mouth every 6 (six) hours as needed for moderate pain.   Vitamin D3 25 MCG (1000 UT) Caps Take 1,000 Units by mouth daily.      Follow-up Information    Vyas, Dhruv B, MD. Call in 1 day(s).    Specialty: Internal Medicine Contact information: [place] [address] [phone]        [name], MD Follow up.   Specialty: Cardiology Why: your appointment is on 5/11 at 11:40am Contact information: [place] [address] [phone]        Grace Isaac, MD Follow up.   Specialty: Cardiothoracic Surgery Why: Your follow-up appointment is on 5/27 at 10:30am. Please arrive at 10:00am for a chest xray located at [facility] which is on the first floor of our building.  Contact information: [address] [place] [address] [phone]          The patient has been discharged on:   1.Beta Blocker:  Yes [ yes  ]  No   [   ]                              If No, reason:  2.Ace Inhibitor/ARB: Yes [   ]                                     No  [  no  ]                                     If No, reason: home Norvasc restarted.   3.Statin:   Yes [ yes  ]                  No  [   ]                  If No, reason:  4.Ecasa:  Yes  [ yes ]                  No   [   ]                  If No, reason:    Signed: Elgie Collard 03/09/2020, 8:21 AM

## 2020-03-04 NOTE — Plan of Care (Signed)
  Problem: Activity: Goal: Risk for activity intolerance will decrease Outcome: Progressing Note: Progressively able to walk farther distances, however, pt is very resistant to mobility.   Problem: Cardiac: Goal: Will achieve and/or maintain hemodynamic stability Outcome: Progressing   Problem: Clinical Measurements: Goal: Postoperative complications will be avoided or minimized Outcome: Progressing   Problem: Nutrition: Goal: Adequate nutrition will be maintained Outcome: Progressing   Problem: Coping: Goal: Level of anxiety will decrease Outcome: Progressing   Problem: Pain Managment: Goal: General experience of comfort will improve Outcome: Progressing

## 2020-03-04 NOTE — Progress Notes (Signed)
Patient ID: [name], female   DOB: 1936/02/05, 84 y.o.   MRN: [id number] TCTS Evening Rounds:  Hemodynamically stable in sinus rhythm.  Was up to chair today. Ambulated with PT.  Diuresing well.

## 2020-03-04 NOTE — Progress Notes (Signed)
Physical Therapy Treatment Patient Details Name: [name] MRN: [id number] DOB: January 27, 1936 Today's Date: 03/04/2020    History of Present Illness Pt adm for CABG x 3 on 03/02/20. PMH - HTN, CAD, lt CEA, atherosclerotic ulcer of abdominal aorta with stent graft, renal artery stenosis with stents.     PT Comments    Pt progressing with mobility. Needs much encouragement for activity but actually moves well when she does get up.    Follow Up Recommendations  Home health PT;Supervision - Intermittent     Equipment Recommendations  Other (comment)(rollator)    Recommendations for Other Services       Precautions / Restrictions Precautions Precautions: Sternal    Mobility  Bed Mobility Overal bed mobility: Needs Assistance Bed Mobility: Supine to Sit     Supine to sit: Min guard     General bed mobility comments: Assist for lines/tubes  Transfers Overall transfer level: Needs assistance Equipment used: 4-wheeled walker Transfers: Sit to/from Stand Sit to Stand: Min guard         General transfer comment: Assist for lines/safety. Verbal cues for hand placement  Ambulation/Gait Ambulation/Gait assistance: Min guard Gait Distance (Feet): 250 Feet Assistive device: 4-wheeled walker Gait Pattern/deviations: Step-through pattern;Decreased stride length Gait velocity: decr Gait velocity interpretation: 1.31 - 2.62 ft/sec, indicative of limited community ambulator General Gait Details: Assist for lines and safety. Pt with 2 standing rest breaks   Stairs             Wheelchair Mobility    Modified Rankin (Stroke Patients Only)       Balance Overall balance assessment: Mild deficits observed, not formally tested                                          Cognition Arousal/Alertness: Awake/alert Behavior During Therapy: Flat affect Overall Cognitive Status: Within Functional Limits for tasks assessed                                  General Comments: Needs verbal encouragement for any activity      Exercises      General Comments General comments (skin integrity, edema, etc.): Pt on 4L. SpO2 not reading during amb. When seated SpO2 94%.      Pertinent Vitals/Pain Pain Assessment: 0-10 Pain Score: 6  Pain Location: chest Pain Descriptors / Indicators: Sore Pain Intervention(s): Limited activity within patient's tolerance;Monitored during session    Home Living                      Prior Function            PT Goals (current goals can now be found in the care plan section) Acute Rehab PT Goals Patient Stated Goal: return home Progress towards PT goals: Progressing toward goals    Frequency    Min 3X/week      PT Plan Discharge plan needs to be updated    Co-evaluation              AM-PAC PT "6 Clicks" Mobility   Outcome Measure  Help needed turning from your back to your side while in a flat bed without using bedrails?: A Little Help needed moving from lying on your back to sitting on the side of a flat bed without using  bedrails?: A Little Help needed moving to and from a bed to a chair (including a wheelchair)?: A Little Help needed standing up from a chair using your arms (e.g., wheelchair or bedside chair)?: A Little Help needed to walk in hospital room?: A Little Help needed climbing 3-5 steps with a railing? : A Little 6 Click Score: 18    End of Session Equipment Utilized During Treatment: Oxygen Activity Tolerance: Patient limited by fatigue Patient left: in chair;with call bell/phone within reach Nurse Communication: Mobility status PT Visit Diagnosis: Other abnormalities of gait and mobility (R26.89);Pain Pain - part of body: (chest and lt subscapular)     Time: [id number] PT Time Calculation (min) (ACUTE ONLY): 24 min  Charges:  $Gait Training: 23-37 mins                     [place] Pager  [phone] Office [place] 03/04/2020, 2:08 PM

## 2020-03-05 ENCOUNTER — Inpatient Hospital Stay (HOSPITAL_COMMUNITY): Payer: Medicare Other

## 2020-03-05 LAB — COMPREHENSIVE METABOLIC PANEL
ALT: 25 U/L (ref 0–44)
AST: 24 U/L (ref 15–41)
Albumin: 3 g/dL — ABNORMAL LOW (ref 3.5–5.0)
Alkaline Phosphatase: 61 U/L (ref 38–126)
Anion gap: 9 (ref 5–15)
BUN: 16 mg/dL (ref 8–23)
CO2: 27 mmol/L (ref 22–32)
Calcium: 9 mg/dL (ref 8.9–10.3)
Chloride: 95 mmol/L — ABNORMAL LOW (ref 98–111)
Creatinine, Ser: 0.62 mg/dL (ref 0.44–1.00)
GFR calc Af Amer: >60 mL/min (ref >60)
GFR calc non Af Amer: >60 mL/min (ref >60)
Glucose, Bld: 115 mg/dL — ABNORMAL HIGH (ref 70–99)
Potassium: 3.4 mmol/L — ABNORMAL LOW (ref 3.5–5.1)
Sodium: 131 mmol/L — ABNORMAL LOW (ref 135–145)
Total Bilirubin: 1 mg/dL (ref 0.3–1.2)
Total Protein: 4.9 g/dL — ABNORMAL LOW (ref 6.5–8.1)

## 2020-03-05 LAB — CBC
HCT: 35.2 % — ABNORMAL LOW (ref 36.0–46.0)
Hemoglobin: 11.2 g/dL — ABNORMAL LOW (ref 12.0–15.0)
MCH: 26.7 pg (ref 26.0–34.0)
MCHC: 31.8 g/dL (ref 30.0–36.0)
MCV: 83.8 fL (ref 80.0–100.0)
Platelets: 180 10*3/uL (ref 150–400)
RBC: 4.2 MIL/uL (ref 3.87–5.11)
RDW: 15.9 % — ABNORMAL HIGH (ref 11.5–15.5)
WBC: 10.3 10*3/uL (ref 4.0–10.5)
nRBC: 0 % (ref 0.0–0.2)

## 2020-03-05 LAB — GLUCOSE, CAPILLARY
Glucose-Capillary: 114 mg/dL — ABNORMAL HIGH (ref 70–99)
Glucose-Capillary: 136 mg/dL — ABNORMAL HIGH (ref 70–99)
Glucose-Capillary: 162 mg/dL — ABNORMAL HIGH (ref 70–99)
Glucose-Capillary: 155 mg/dL — ABNORMAL HIGH (ref 70–99)
Glucose-Capillary: 166 mg/dL — ABNORMAL HIGH (ref 70–99)
Glucose-Capillary: 100 mg/dL — ABNORMAL HIGH (ref 70–99)

## 2020-03-05 MED ORDER — AMIODARONE HCL IN DEXTROSE 360-4.14 MG/200ML-% IV SOLN
60.0000 mg/h | INTRAVENOUS | Status: DC
  Administered 2020-03-05 (×2): 60 mg/h via INTRAVENOUS
  Filled 2020-03-05: qty 200

## 2020-03-05 MED ORDER — FUROSEMIDE 10 MG/ML IJ SOLN
40.0000 mg | Freq: Once | INTRAMUSCULAR | Status: AC
  Administered 2020-03-05: 40 mg via INTRAVENOUS
  Filled 2020-03-05: qty 4

## 2020-03-05 MED ORDER — AMIODARONE LOAD VIA INFUSION
150.0000 mg | Freq: Once | INTRAVENOUS | Status: AC
  Administered 2020-03-05: 11:00:00 150 mg via INTRAVENOUS

## 2020-03-05 MED ORDER — METOPROLOL TARTRATE 25 MG PO TABS
25.0000 mg | ORAL_TABLET | Freq: Two times a day (BID) | ORAL | Status: DC
  Administered 2020-03-05 – 2020-03-09 (×8): 25 mg via ORAL
  Filled 2020-03-05 (×9): qty 1

## 2020-03-05 MED ORDER — POTASSIUM CHLORIDE 10 MEQ/50ML IV SOLN
10.0000 meq | INTRAVENOUS | Status: AC
  Administered 2020-03-05 (×3): 10 meq via INTRAVENOUS
  Filled 2020-03-05: qty 50

## 2020-03-05 MED ORDER — POTASSIUM CHLORIDE 10 MEQ/50ML IV SOLN
10.0000 meq | INTRAVENOUS | Status: AC
  Administered 2020-03-05 (×3): 10 meq via INTRAVENOUS
  Filled 2020-03-05 (×3): qty 50

## 2020-03-05 MED ORDER — FENTANYL CITRATE (PF) 100 MCG/2ML IJ SOLN: 50.0000 ug | INTRAMUSCULAR | Status: DC | PRN

## 2020-03-05 MED ORDER — POTASSIUM CHLORIDE CRYS ER 20 MEQ PO TBCR
20.0000 meq | EXTENDED_RELEASE_TABLET | ORAL | Status: DC
  Filled 2020-03-05: qty 1

## 2020-03-05 MED ORDER — AMIODARONE HCL IN DEXTROSE 360-4.14 MG/200ML-% IV SOLN
30.0000 mg/h | INTRAVENOUS | Status: DC
  Administered 2020-03-05 – 2020-03-06 (×2): 30 mg/h via INTRAVENOUS
  Filled 2020-03-05 (×3): qty 200

## 2020-03-05 NOTE — Progress Notes (Signed)
PT Cancellation Note  Patient Details Name: [name] MRN: [id number] DOB: Apr 05, 1936   Cancelled Treatment:    Reason Eval/Treat Not Completed: Other (comment)(Pt in afib per nurse. Asked to HOLD today. )   Denice Paradise 03/05/2020, 11:24 AM Vahe Pienta W,PT Acute Rehabilitation Services Pager:  [phone]  Office:  [phone]

## 2020-03-05 NOTE — Progress Notes (Signed)
3 Days Post-Op Procedure(s) (LRB): CORONARY ARTERY BYPASS GRAFTING (CABG)x 3, LIMA TO LAD, SVG TO DIAGONAL, SVG TO RCA, WITH OPEN HARVESTING OF LEFT LOWER GREATER SAPHENOUS VEIN AND RIGHT AND LEFT GREATER SAPHENOUS VEIN HARVESTED ENDOSCOPICALLY (N/A) TRANSESOPHAGEAL ECHOCARDIOGRAM (TEE) (N/A) Endovein Harvest Of Greater Saphenous Vein (Bilateral) Subjective: Complains of heart fluttering. She went into atrial fib/flutter 160-180 this am. Started on IV amio and now atrial flutter 105. I was able to rapid atrial pace into sinus 75. She says fluttering has resolved.  Objective: Vital signs in last 24 hours: Temp:  [97.5 F (36.4 C)-98.4 F (36.9 C)] 97.7 F (36.5 C) (04/24 1146) Pulse Rate:  [85-154] 112 (04/24 1300) Cardiac Rhythm: Atrial fibrillation (04/24 1200) Resp:  [12-24] 21 (04/24 1300) BP: (99-187)/(57-121) 107/91 (04/24 1300) SpO2:  [76 %-99 %] 94 % (04/24 1300) Weight:  [54.2 kg] 54.2 kg (04/24 0500)  Hemodynamic parameters for last 24 hours:    Intake/Output from previous day: 04/23 0701 - 04/24 0700 In: 541.9 [P.O.:520; I.V.:21.9] Out: 2253 [Urine:2173; Chest Tube:80] Intake/Output this shift: Total I/O In: 120 [P.O.:120] Out: -   General appearance: alert and cooperative Neurologic: intact Heart: regular rate and rhythm, S1, S2 normal, no murmur, click, rub or gallop Lungs: clear to auscultation bilaterally Extremities: edema mild Wound: incisions ok  Lab Results: Recent Labs    03/04/20 0408 03/05/20 0436  WBC 15.3* 10.3  HGB 11.9* 11.2*  HCT 36.8 35.2*  PLT 207 180   BMET:  Recent Labs    03/04/20 0408 03/05/20 0436  NA 134* 131*  K 4.0 3.4*  CL 102 95*  CO2 26 27  GLUCOSE 129* 115*  BUN 16 16  CREATININE 0.94 0.62  CALCIUM 9.3 9.0    PT/INR:  Recent Labs    03/02/20 1533  LABPROT 17.4*  INR 1.4*   ABG    Component Value Date/Time   PHART 7.352 03/03/2020 0025   HCO3 22.9 03/03/2020 0025   TCO2 24 03/03/2020 0025   ACIDBASEDEF  3.0 (H) 03/03/2020 0025   O2SAT 100.0 03/03/2020 0025   CBG (last 3)  Recent Labs    03/05/20 0418 03/05/20 0757 03/05/20 1143  GLUCAP 114* 136* 162*   CLINICAL DATA:  Chest tube removal  EXAM: PORTABLE CHEST 1 VIEW  COMPARISON:  March 04, 2020  FINDINGS: A left-sided chest tube is been removed. No pneumothorax. Stable right IJ sheath. There is a small right-sided pleural effusion with underlying opacity, similar given difference in positioning. Opacity in left base favored represent atelectasis. Infiltrate not completely excluded. The cardiomediastinal silhouette is stable. No overt edema. No other interval changes.  IMPRESSION: 1. Support apparatus as above. No pneumothorax after left chest tube removal. 2. Mild opacity in left base is stable, probably atelectasis. Infiltrate not completely excluded. Recommend attention on follow-up. 3. There is a small right pleural effusion with underlying opacity, likely atelectasis, similar in the interval.   Electronically Signed   By: Dorise Bullion III M.D   On: 03/05/2020 11:59   Assessment/Plan: S/P Procedure(s) (LRB): CORONARY ARTERY BYPASS GRAFTING (CABG)x 3, LIMA TO LAD, SVG TO DIAGONAL, SVG TO RCA, WITH OPEN HARVESTING OF LEFT LOWER GREATER SAPHENOUS VEIN AND RIGHT AND LEFT GREATER SAPHENOUS VEIN HARVESTED ENDOSCOPICALLY (N/A) TRANSESOPHAGEAL ECHOCARDIOGRAM (TEE) (N/A) Endovein Harvest Of Greater Saphenous Vein (Bilateral)  POD 3 Hemodynamically stable  Postop atrial fib/flutter with RVR converted with amio and RAP. Continue IV amio today. Will switch labetalol to metoprolol for better rate control.  Glucose under good  control.  Volume excess: diuresed well yesterday. Wt still about 8 lbs over preop. Continue diuresis and replace K+  OOB, IS   LOS: 3 days    Gaye Pollack 03/05/2020

## 2020-03-05 NOTE — Progress Notes (Signed)
Patient ID: [name], female   DOB: Jul 21, 1936, 84 y.o.   MRN: [id number]  TCTS Evening Rounds:  Hemodynamically stable in sinus rhythm on IV amio. Will plan to switch to po in am.  Urine output ok

## 2020-03-06 LAB — BASIC METABOLIC PANEL
Anion gap: 9 (ref 5–15)
BUN: 19 mg/dL (ref 8–23)
CO2: 27 mmol/L (ref 22–32)
Calcium: 8.9 mg/dL (ref 8.9–10.3)
Chloride: 99 mmol/L (ref 98–111)
Creatinine, Ser: 0.78 mg/dL (ref 0.44–1.00)
GFR calc Af Amer: >60 mL/min (ref >60)
GFR calc non Af Amer: >60 mL/min (ref >60)
Glucose, Bld: 130 mg/dL — ABNORMAL HIGH (ref 70–99)
Potassium: 3.6 mmol/L (ref 3.5–5.1)
Sodium: 135 mmol/L (ref 135–145)

## 2020-03-06 LAB — GLUCOSE, CAPILLARY
Glucose-Capillary: 141 mg/dL — ABNORMAL HIGH (ref 70–99)
Glucose-Capillary: 98 mg/dL (ref 70–99)
Glucose-Capillary: 136 mg/dL — ABNORMAL HIGH (ref 70–99)
Glucose-Capillary: 117 mg/dL — ABNORMAL HIGH (ref 70–99)
Glucose-Capillary: 115 mg/dL — ABNORMAL HIGH (ref 70–99)

## 2020-03-06 MED ORDER — ACETAMINOPHEN 325 MG PO TABS
650.0000 mg | ORAL_TABLET | Freq: Four times a day (QID) | ORAL | Status: DC | PRN
  Administered 2020-03-06 – 2020-03-09 (×6): 650 mg via ORAL
  Filled 2020-03-06 (×6): qty 2

## 2020-03-06 MED ORDER — ~~LOC~~ CARDIAC SURGERY, PATIENT & FAMILY EDUCATION: Freq: Once | Status: AC

## 2020-03-06 MED ORDER — INSULIN ASPART 100 UNIT/ML ~~LOC~~ SOLN
0.0000 [IU] | Freq: Three times a day (TID) | SUBCUTANEOUS | Status: DC
  Administered 2020-03-06 – 2020-03-09 (×6): 2 [IU] via SUBCUTANEOUS
  Administered 2020-03-09: 4 [IU] via SUBCUTANEOUS

## 2020-03-06 MED ORDER — DOCUSATE SODIUM 100 MG PO CAPS
200.0000 mg | ORAL_CAPSULE | Freq: Every day | ORAL | Status: DC
  Filled 2020-03-06 (×3): qty 2

## 2020-03-06 MED ORDER — SODIUM CHLORIDE 0.9% FLUSH
3.0000 mL | Freq: Two times a day (BID) | INTRAVENOUS | Status: DC
  Administered 2020-03-06 – 2020-03-09 (×6): 3 mL via INTRAVENOUS

## 2020-03-06 MED ORDER — SODIUM CHLORIDE 0.9% FLUSH: 3.0000 mL | INTRAVENOUS | Status: DC | PRN

## 2020-03-06 MED ORDER — TRAMADOL HCL 50 MG PO TABS
50.0000 mg | ORAL_TABLET | Freq: Four times a day (QID) | ORAL | Status: DC | PRN
  Administered 2020-03-07 – 2020-03-08 (×4): 50 mg via ORAL
  Filled 2020-03-06 (×4): qty 1

## 2020-03-06 MED ORDER — ONDANSETRON HCL 4 MG/2ML IJ SOLN
4.0000 mg | Freq: Four times a day (QID) | INTRAMUSCULAR | Status: DC | PRN
  Administered 2020-03-06: 4 mg via INTRAVENOUS
  Filled 2020-03-06: qty 2

## 2020-03-06 MED ORDER — POTASSIUM CHLORIDE CRYS ER 20 MEQ PO TBCR
20.0000 meq | EXTENDED_RELEASE_TABLET | Freq: Every day | ORAL | Status: AC
  Administered 2020-03-07 – 2020-03-09 (×3): 20 meq via ORAL
  Filled 2020-03-06 (×3): qty 1

## 2020-03-06 MED ORDER — SODIUM CHLORIDE 0.9 % IV SOLN: 250.0000 mL | INTRAVENOUS | Status: DC | PRN

## 2020-03-06 MED ORDER — INSULIN ASPART 100 UNIT/ML ~~LOC~~ SOLN: 0.0000 [IU] | Freq: Three times a day (TID) | SUBCUTANEOUS | Status: DC

## 2020-03-06 MED ORDER — FUROSEMIDE 40 MG PO TABS
40.0000 mg | ORAL_TABLET | Freq: Every day | ORAL | Status: AC
  Administered 2020-03-07 – 2020-03-08 (×2): 40 mg via ORAL
  Filled 2020-03-06 (×3): qty 1

## 2020-03-06 MED ORDER — ONDANSETRON HCL 4 MG PO TABS: 4.0000 mg | ORAL_TABLET | Freq: Four times a day (QID) | ORAL | Status: DC | PRN

## 2020-03-06 MED ORDER — AMIODARONE HCL 200 MG PO TABS
400.0000 mg | ORAL_TABLET | Freq: Two times a day (BID) | ORAL | Status: DC
  Administered 2020-03-06 – 2020-03-09 (×7): 400 mg via ORAL
  Filled 2020-03-06 (×8): qty 2

## 2020-03-06 MED ORDER — POTASSIUM CHLORIDE 10 MEQ/50ML IV SOLN
10.0000 meq | INTRAVENOUS | Status: AC
  Administered 2020-03-06 (×3): 10 meq via INTRAVENOUS
  Filled 2020-03-06 (×2): qty 50

## 2020-03-06 MED ORDER — ASPIRIN EC 325 MG PO TBEC
325.0000 mg | DELAYED_RELEASE_TABLET | Freq: Every day | ORAL | Status: DC
  Administered 2020-03-07 – 2020-03-09 (×3): 325 mg via ORAL
  Filled 2020-03-06 (×3): qty 1

## 2020-03-06 MED ORDER — PANTOPRAZOLE SODIUM 40 MG PO TBEC
40.0000 mg | DELAYED_RELEASE_TABLET | Freq: Every day | ORAL | Status: DC
  Administered 2020-03-07 – 2020-03-09 (×3): 40 mg via ORAL
  Filled 2020-03-06 (×3): qty 1

## 2020-03-06 NOTE — Progress Notes (Signed)
Patient ambulated in hall with front wheel walker, minimal assist for setup and ambulated independently for 470 feet. Ambulated on room air and tolerated well. Assisted back to bed with call bell in reach, no complaints of pain, will continue to monitor.

## 2020-03-06 NOTE — Progress Notes (Signed)
Pt admitted today from [facility].  CHG bath completed.  Pt's vitals taken and all within normal range.  Pt A&O x 4 and neuro intact.  Pt currently comfortable and will continue to be monitored.

## 2020-03-06 NOTE — Progress Notes (Signed)
4 Days Post-Op Procedure(s) (LRB): CORONARY ARTERY BYPASS GRAFTING (CABG)x 3, LIMA TO LAD, SVG TO DIAGONAL, SVG TO RCA, WITH OPEN HARVESTING OF LEFT LOWER GREATER SAPHENOUS VEIN AND RIGHT AND LEFT GREATER SAPHENOUS VEIN HARVESTED ENDOSCOPICALLY (N/A) TRANSESOPHAGEAL ECHOCARDIOGRAM (TEE) (N/A) Endovein Harvest Of Greater Saphenous Vein (Bilateral) Subjective: Feels better. Still complains of some shortness of breath.  Has maintained sinus on IV amio yesterday  Objective: Vital signs in last 24 hours: Temp:  [97.5 F (36.4 C)-98.4 F (36.9 C)] 98.4 F (36.9 C) (04/25 0738) Pulse Rate:  [74-154] 88 (04/25 0900) Cardiac Rhythm: Normal sinus rhythm (04/25 0800) Resp:  [14-25] 23 (04/25 0900) BP: (107-187)/(56-121) 148/79 (04/25 0900) SpO2:  [89 %-99 %] 96 % (04/25 0900) Weight:  [52.9 kg] 52.9 kg (04/25 0500)  Hemodynamic parameters for last 24 hours:    Intake/Output from previous day: 04/24 0701 - 04/25 0700 In: 1250.8 [P.O.:500; I.V.:483.5; IV Piggyback:267.3] Out: 2350 [Urine:2350] Intake/Output this shift: Total I/O In: 80.9 [I.V.:33.3; IV Piggyback:47.6] Out: -   General appearance: alert and cooperative Neurologic: intact Heart: regular rate and rhythm, S1, S2 normal, no murmur, click, rub or gallop Lungs: clear to auscultation bilaterally Extremities: extremities normal, atraumatic, no cyanosis or edema Wound: dressing dry  Lab Results: Recent Labs    03/04/20 0408 03/05/20 0436  WBC 15.3* 10.3  HGB 11.9* 11.2*  HCT 36.8 35.2*  PLT 207 180   BMET:  Recent Labs    03/05/20 0436 03/06/20 0402  NA 131* 135  K 3.4* 3.6  CL 95* 99  CO2 27 27  GLUCOSE 115* 130*  BUN 16 19  CREATININE 0.62 0.78  CALCIUM 9.0 8.9    PT/INR: No results for input(s): LABPROT, INR in the last 72 hours. ABG    Component Value Date/Time   PHART 7.352 03/03/2020 0025   HCO3 22.9 03/03/2020 0025   TCO2 24 03/03/2020 0025   ACIDBASEDEF 3.0 (H) 03/03/2020 0025   O2SAT 100.0  03/03/2020 0025   CBG (last 3)  Recent Labs    03/05/20 2316 03/06/20 0326 03/06/20 0741  GLUCAP 100* 141* 98    Assessment/Plan: S/P Procedure(s) (LRB): CORONARY ARTERY BYPASS GRAFTING (CABG)x 3, LIMA TO LAD, SVG TO DIAGONAL, SVG TO RCA, WITH OPEN HARVESTING OF LEFT LOWER GREATER SAPHENOUS VEIN AND RIGHT AND LEFT GREATER SAPHENOUS VEIN HARVESTED ENDOSCOPICALLY (N/A) TRANSESOPHAGEAL ECHOCARDIOGRAM (TEE) (N/A) Endovein Harvest Of Greater Saphenous Vein (Bilateral)  POD 4  Hemodynamically stable in sinus rhythm on IV amio. Will switch to po and remove sleeve. Continue Lopressor.  Postop atrial fib/flutter with RVR: converted with amio.  Volume excess: wt is 5.5 lbs over preop. Continue diuresis. Replace K+  DM: glucose under control. preop Hgb A1c was 6.4 on no meds. Continue SSI.   Transfer to 4E and continue IS, ambulation.   LOS: 4 days    Gaye Pollack 03/06/2020

## 2020-03-06 NOTE — Op Note (Signed)
NAME: [name], [name] MEDICAL RECORD [id number] ACCOUNT [id number] DATE OF BIRTH:1936-03-05 FACILITY: [facility] LOCATION: [facility] PHYSICIAN:Arrin Pintor Maryruth Bun, MD  OPERATIVE REPORT  DATE OF PROCEDURE:  03/02/2020  PREOPERATIVE DIAGNOSES:  Unstable angina with severe 2-vessel coronary artery disease.  POSTOPERATIVE DIAGNOSES:  Unstable angina with severe 2-vessel coronary artery disease.  SURGICAL PROCEDURE:  Coronary artery bypass grafting x3 with the left internal mammary artery to the left anterior descending coronary artery, reverse saphenous vein graft to the distal right coronary artery, reverse saphenous vein graft to the first  diagonal coronary artery with endoscopic vein harvesting, right and left greater saphenous veins, thigh open vein harvesting, left greater saphenous vein lower leg.  SURGEON:  Lanelle Bal, MD  FIRST ASSISTANT:  Lars Pinks PA.  BRIEF HISTORY:  The patient is an 84 year old female who for several months had been having decreasing limiting anginal symptoms.  In mid-March, she had undergone cardiac catheterization by Dr. Saunders Revel, which demonstrated a tortuous complex large right  coronary artery with greater than 85% stenosis and proximal LAD and diagonal disease, 80%.  Because of the highly calcified and tortuous right coronary artery, atherectomy on the right coronary artery was thought to not be feasible.  Coronary artery  bypass grafting was discussed with the patient at that time; however, she adamantly refused to have bypass surgery.  Over the past month, the patient has continued to have unstable anginal symptoms with good medical therapy.  She notes that she is barely  able to do anything around her house without having discomfort in her chest.  Because of her symptoms, she was seen back in the office and again discussed with her and her daughter proceeding with coronary artery bypass grafting.  Again, the films were  reviewed with interventional  cardiology and several different operators did not feel comfortable attempting atherectomy on the large right coronary artery.  Risks and options of surgery were discussed with her and her daughter in detail.  She was willing  to proceed.  It should be noted that the patient also has been anemic.  We discussed the need for blood transfusion associated with surgery.  DESCRIPTION OF PROCEDURE:  The patient underwent general endotracheal anesthesia after arterial line and central lines had been placed by anesthesia.  TEE probe was placed and confirmed preoperative echocardiogram some sclerosis of the aortic valve  without stenosis.  Trace mitral regurgitation and overall preserved LV function.  The skin of the chest and legs was prepped with Betadine, draped in the usual sterile manner.  Appropriate timeout was performed.  We then proceeded with endoscopic vein  harvesting of the right thigh vein.  This vein was relatively small, so additional endoscopic vein harvesting was done in the left thigh.  A good segment for 1 bypass was obtained from this area.  In the area of the knee, the vein became small.  We then  proceeded with open vein harvesting of the left greater saphenous vein at the ankle, up the leg for 1 segment of vein for 1 bypass.  Median sternotomy was performed.  The left internal mammary artery was dissected down as a pedicle graft.  The distal  artery was divided and had good free flow.  Pericardium was opened.  Overall, ventricular function appeared preserved.  The patient was systemically heparinized.  The ascending aorta was cannulated.  The right atrium was cannulated.  An aortic root vent  cardioplegia needle was introduced into the ascending aorta.  The patient was placed on cardiopulmonary bypass  at 2.4 liters per minute per meter square.  Sites of anastomosis were selected and dissected out of the epicardium without difficulty.  The  patient's body temperature was cooled to 32 degrees.   Aortic crossclamp was applied; 500 mL of cold blood potassium cardioplegia was administered with diastolic arrest of the heart.  We turned our attention first to the distal right coronary artery.  The  vessel was slightly thickened, was opened and easily admitted a 1.5 mm probe distally.  Using a running 7-0 Prolene, a distal anastomosis was performed with a segment of reverse saphenous vein graft.  The heart was then elevated and the first diagonal  vessel was large enough to bypass.  The vessel was opened, admitted 1 mm probe distally.  Using a running 7-0 Prolene, distal anastomosis was performed.  Additional cold blood cardioplegia was administered down the vein grafts.  Attention was then turned  to the left anterior descending coronary artery, which was opened between the mid and distal third.  A 1.5 mm probe passed proximally and distally.  Using a running 8-0 Prolene, the left internal mammary artery was anastomosed to the left anterior  descending coronary artery.  With release of the bulldog on the mammary artery, there was prompt rise in myocardial septal temperature.  The bulldog was placed back on the mammary artery.  Additional cold blood cardioplegia was administered in the aortic  root and down the vein grafts.  With cross-clamp still in place, 2 punch aortotomies were performed and each of the 2 vein grafts were anastomosed to the ascending aorta.  The bulldog on the mammary artery was removed.  The heart was allowed to  passively fill and deair and the proximal anastomoses were completed.  Aortic crossclamp was removed with a total crossclamp time of 77 minutes.  On TEE, the patient did have some intraventricular air.  A 16-gauge needle was introduced into the left  ventricular apex to assist in deairing the heart.  With the body temperature rewarmed to 37 degrees.  She was then ventilated and weaned from cardiopulmonary bypass without difficulty.  She remained hemodynamically stable.  She  was decannulated in the  usual fashion.  Protamine sulfate was administered with operative field hemostatic atrial and ventricular pacing wires were applied.  She did require atrially pacing to increase rate.  Sites of anastomosis were inspected and were free of bleeding.  The  pericardium was loosely reapproximated.  A left pleural tube and a Blake mediastinal drain were left in place.  Sternum was closed with #6 stainless steel wire.  Fascia closed with interrupted 0 Vicryl, running 3-0 Vicryl in subcutaneous tissue, 3-0  subcuticular stitch in skin edges.  Dry dressings were applied.  The patient tolerated the procedure without obvious complication and was transferred to the surgical intensive care unit for postoperative care.  Total pump time was 113 minutes.  Because of the patient's small surface area of 1.5 and significant anemia preoperatively, she was given 4 units of packed red blood cells in the pump and prior to pump run.  JN/NUANCE  D:03/06/2020 T:03/06/2020 JOB:[id number]

## 2020-03-06 NOTE — Plan of Care (Signed)

## 2020-03-07 LAB — CBC
HCT: 37.9 % (ref 36.0–46.0)
Hemoglobin: 12 g/dL (ref 12.0–15.0)
MCH: 26.7 pg (ref 26.0–34.0)
MCHC: 31.7 g/dL (ref 30.0–36.0)
MCV: 84.4 fL (ref 80.0–100.0)
Platelets: 234 10*3/uL (ref 150–400)
RBC: 4.49 MIL/uL (ref 3.87–5.11)
RDW: 16.4 % — ABNORMAL HIGH (ref 11.5–15.5)
WBC: 7.5 10*3/uL (ref 4.0–10.5)
nRBC: 0 % (ref 0.0–0.2)

## 2020-03-07 LAB — BASIC METABOLIC PANEL
Anion gap: 7 (ref 5–15)
BUN: 16 mg/dL (ref 8–23)
CO2: 28 mmol/L (ref 22–32)
Calcium: 9.4 mg/dL (ref 8.9–10.3)
Chloride: 102 mmol/L (ref 98–111)
Creatinine, Ser: 0.78 mg/dL (ref 0.44–1.00)
GFR calc Af Amer: >60 mL/min (ref >60)
GFR calc non Af Amer: >60 mL/min (ref >60)
Glucose, Bld: 148 mg/dL — ABNORMAL HIGH (ref 70–99)
Potassium: 3.9 mmol/L (ref 3.5–5.1)
Sodium: 137 mmol/L (ref 135–145)

## 2020-03-07 LAB — GLUCOSE, CAPILLARY
Glucose-Capillary: 125 mg/dL — ABNORMAL HIGH (ref 70–99)
Glucose-Capillary: 105 mg/dL — ABNORMAL HIGH (ref 70–99)
Glucose-Capillary: 107 mg/dL — ABNORMAL HIGH (ref 70–99)
Glucose-Capillary: 160 mg/dL — ABNORMAL HIGH (ref 70–99)

## 2020-03-07 NOTE — Progress Notes (Signed)
Occupational Therapy Treatment + Discharge Patient Details Name: [name] MRN: [id number] DOB: 23-Oct-1936 Today's Date: 03/07/2020    History of present illness Pt adm for CABG x 3 on 03/02/20. PMH - HTN, CAD, lt CEA, atherosclerotic ulcer of abdominal aorta with stent graft, renal artery stenosis with stents.    OT comments  Pt progressing to modified independence with ADL and mobility in room so OT d.c today. Pt education thoroughly on energy conservation and ADLs regarding "staying in the tube" to avoid push/pull. Pt using hip hike/figure 4 technique. pt supervisionA for walk in transfer simulation. Pt able to state no push, no pull, cues for no overhead movements.  Pt reported slight light headedness in sitting 154/86, HR 86 BPM with exertion. Pt lying down to rest to conclude session. Dizziness subsided. OT signing off. No further OT skilled services.     Follow Up Recommendations  No OT follow up;Supervision - Intermittent    Equipment Recommendations  None recommended by OT    Recommendations for Other Services      Precautions / Restrictions Precautions Precautions: Sternal Precaution Comments: Pt able to state "no push/pull" pt requiring cues for no overhead movements Restrictions Weight Bearing Restrictions: Yes Other Position/Activity Restrictions: sternal       Mobility Bed Mobility Overal bed mobility: Needs Assistance     Sidelying to sit: Supervision Supine to sit: Supervision     General bed mobility comments: no physical assist; 1 cue to remind pt to not use LUE for stability   Transfers Overall transfer level: Needs assistance   Transfers: Sit to/from Stand Sit to Stand: Supervision         General transfer comment: no cues required; hands on lap    Balance Overall balance assessment: No apparent balance deficits (not formally assessed)                                         ADL either performed or assessed with  clinical judgement   ADL Overall ADL's : Needs assistance/impaired     Grooming: Modified independent;Standing Grooming Details (indicate cue type and reason): Pt gathered materials from table to place on counter before beginning task.             Lower Body Dressing: Modified independent;Sit to/from stand;Sitting/lateral leans Lower Body Dressing Details (indicate cue type and reason): Pt using figure 4 technique for ADL tasks. Pt standing to simulate pulling pants to waist without shoulder abduction/ "staying in the tube."         Tub/ Shower Transfer: Modified independent;Ambulation;Rolling walker Tub/Shower Transfer Details (indicate cue type and reason): Pt simulation of shower transfer into walk in shower at home. Pt using wall for stability and taking increased height steps for ledge height. Pt reports she has a shower chair to use. Functional mobility during ADLs: Supervision/safety;Rolling walker General ADL Comments: Pt education thoroughly on energy conservation and ADLs regarding "staying in the tube" to avoid push/pull.     Vision   Vision Assessment?: No apparent visual deficits   Perception     Praxis      Cognition Arousal/Alertness: Awake/alert Behavior During Therapy: WFL for tasks assessed/performed Overall Cognitive Status: Within Functional Limits for tasks assessed  Exercises General Exercises - Lower Extremity Long Arc Quad: AROM;Both;Seated;10 reps Hip Flexion/Marching: AROM;Both;Seated;10 reps   Shoulder Instructions       General Comments Pt reported slight light headedness in sitting 154/86 , HR 86 BPM with exertion. Pt lying down to rest.    Pertinent Vitals/ Pain       Pain Assessment: No/denies pain  Home Living                                          Prior Functioning/Environment              Frequency  Min 2X/week        Progress Toward  Goals  OT Goals(current goals can now be found in the care plan section)  Progress towards OT goals: Progressing toward goals  Acute Rehab OT Goals Patient Stated Goal: return home OT Goal Formulation: With patient Time For Goal Achievement: 03/18/20 Potential to Achieve Goals: Good ADL Goals Pt Will Perform Tub/Shower Transfer: Shower transfer;with modified independence;shower seat Additional ADL Goal #1: Pt will recall sternal precautions to all BADL tasks performed Additional ADL Goal #2: Pt will recall and apply 1-3 ECS strategies to improve BADL participation upon d/c home  Plan Discharge plan remains appropriate;All goals met and education completed, patient discharged from OT services    Co-evaluation                 AM-PAC OT "6 Clicks" Daily Activity     Outcome Measure   Help from another person eating meals?: None Help from another person taking care of personal grooming?: A Little Help from another person toileting, which includes using toliet, bedpan, or urinal?: A Little Help from another person bathing (including washing, rinsing, drying)?: A Little Help from another person to put on and taking off regular upper body clothing?: None Help from another person to put on and taking off regular lower body clothing?: None 6 Click Score: 21    End of Session Equipment Utilized During Treatment: Rolling walker  OT Visit Diagnosis: Unsteadiness on feet (R26.81);Pain Pain - part of body: (chest)   Activity Tolerance Patient tolerated treatment well   Patient Left in bed;with call bell/phone within reach   Nurse Communication Mobility status        Time: 1173-5670 OT Time Calculation (min): 24 min  Charges: OT General Charges $OT Visit: 1 Visit OT Treatments $Self Care/Home Management : 8-22 mins $Therapeutic Activity: 8-22 mins  Jefferey Pica, OTR/L Acute Rehabilitation Services Pager: [phone] Office: [phone]    Lazarus Sudbury C 03/07/2020, 4:22  PM

## 2020-03-07 NOTE — Discharge Instructions (Signed)

## 2020-03-07 NOTE — Progress Notes (Signed)
CARDIAC REHAB PHASE I   PRE:  Rate/Rhythm: 77 SR  BP:  Supine: 143/85  Sitting:   Standing:    SaO2: 93%RA  MODE:  Ambulation: 470 ft   POST:  Rate/Rhythm: 85 SR  BP:  Supine:   Sitting: 155/76  Standing:    SaO2: 95%RA 1035-1055 Pt walked 470 ft on RA with rolling walker with steady gait. Tolerated well. Stopped a couple of times to rest. To sitting on side of bed after walk. Encouraged at least one more walk today as that was second walk. Adheres to sternal precautions.   [name], RN BSN  03/07/2020 10:52 AM

## 2020-03-07 NOTE — Progress Notes (Addendum)
      [address]       ,[place] [address]             [phone]      5 Days Post-Op Procedure(s) (LRB): CORONARY ARTERY BYPASS GRAFTING (CABG)x 3, LIMA TO LAD, SVG TO DIAGONAL, SVG TO RCA, WITH OPEN HARVESTING OF LEFT LOWER GREATER SAPHENOUS VEIN AND RIGHT AND LEFT GREATER SAPHENOUS VEIN HARVESTED ENDOSCOPICALLY (N/A) TRANSESOPHAGEAL ECHOCARDIOGRAM (TEE) (N/A) Endovein Harvest Of Greater Saphenous Vein (Bilateral) Subjective: Feels okay, pain in right shoulder and having issues swallowing. She states that she feels that he food is getting stuck.   Objective: Vital signs in last 24 hours: Temp:  [98 F (36.7 C)-98.9 F (37.2 C)] 98.1 F (36.7 C) (04/26 0729) Pulse Rate:  [53-101] 101 (04/26 0729) Cardiac Rhythm: Normal sinus rhythm (04/25 2105) Resp:  [19-23] 20 (04/26 0729) BP: (132-159)/(70-79) 159/76 (04/26 0729) SpO2:  [92 %-98 %] 92 % (04/26 0729) Weight:  [52.2 kg] 52.2 kg (04/26 0513)     Intake/Output from previous day: 04/25 0701 - 04/26 0700 In: 511.8 [P.O.:240; I.V.:107; IV Piggyback:164.9] Out: 600 [Urine:600] Intake/Output this shift: No intake/output data recorded.  General appearance: alert, cooperative and no distress Heart: regular rate and rhythm, S1, S2 normal, no murmur, click, rub or gallop Lungs: clear to auscultation bilaterally Abdomen: soft, non-tender; bowel sounds normal; no masses,  no organomegaly Extremities: extremities normal, atraumatic, no cyanosis or edema Wound: Clean and dry  Lab Results: Recent Labs    03/05/20 0436  WBC 10.3  HGB 11.2*  HCT 35.2*  PLT 180   BMET:  Recent Labs    03/05/20 0436 03/06/20 0402  NA 131* 135  K 3.4* 3.6  CL 95* 99  CO2 27 27  GLUCOSE 115* 130*  BUN 16 19  CREATININE 0.62 0.78  CALCIUM 9.0 8.9    PT/INR: No results for input(s): LABPROT, INR in the last 72 hours. ABG    Component Value Date/Time   PHART 7.352 03/03/2020 0025   HCO3 22.9 03/03/2020 0025   TCO2  24 03/03/2020 0025   ACIDBASEDEF 3.0 (H) 03/03/2020 0025   O2SAT 100.0 03/03/2020 0025   CBG (last 3)  Recent Labs    03/06/20 1539 03/06/20 2122 03/07/20 0605  GLUCAP 117* 115* 125*    Assessment/Plan: S/P Procedure(s) (LRB): CORONARY ARTERY BYPASS GRAFTING (CABG)x 3, LIMA TO LAD, SVG TO DIAGONAL, SVG TO RCA, WITH OPEN HARVESTING OF LEFT LOWER GREATER SAPHENOUS VEIN AND RIGHT AND LEFT GREATER SAPHENOUS VEIN HARVESTED ENDOSCOPICALLY (N/A) TRANSESOPHAGEAL ECHOCARDIOGRAM (TEE) (N/A) Endovein Harvest Of Greater Saphenous Vein (Bilateral)  1. CV- post-op atrial fibrillation. Now in NSR. On PO Amio. BP slightly elevated this morning. Will trend over today and add additional agents as needed. Continue metoprolol, statin, and asa 2. Pulm-tolerating room air with good oxygen saturation. 3. Renal-creatinine 0.78, electrolytes okay. Continue Lasix 40mg  PO 4. H and H 11.2/35.2, expected acute blood loss anemia 5. Endo-blood glucose well controlled.   Plan: Keep wires one more day-will remove tomorrow if remains in NSR. Continue to work with PT-disposition pending. She does have a daughter at home that can assist with care but per the patient she will " need a lot of help"    LOS: 5 days    Elgie Collard 03/07/2020   Chart reviewed, patient examined, agree with above. Maintaining sinus on amio. Will send home on 200 bid.

## 2020-03-07 NOTE — Progress Notes (Signed)
Physical Therapy Treatment Patient Details Name: [name] MRN: [id number] DOB: 1936-07-30 Today's Date: 03/07/2020    History of Present Illness Pt adm for CABG x 3 on 03/02/20. PMH - HTN, CAD, lt CEA, atherosclerotic ulcer of abdominal aorta with stent graft, renal artery stenosis with stents.     PT Comments    Pt pleasant and reports not sleeping well last night and feeling sluggish. Pt able to progress ambulation and perform stairs with minor cues to maintain precautions. D/C plan for home remains appropriate. Will continue to follow.   HR 102-114 BP 159/76 (100) pre gait 163/81 (106) post gait 96% on RA    Follow Up Recommendations  Home health PT;Supervision - Intermittent     Equipment Recommendations  Rolling walker with 5" wheels    Recommendations for Other Services       Precautions / Restrictions Precautions Precautions: Sternal Precaution Comments: reviewed all precautions    Mobility  Bed Mobility   Bed Mobility: Rolling;Sidelying to Sit Rolling: Supervision Sidelying to sit: Supervision       General bed mobility comments: pt able to roll to left as at home and elevate trunk maintaining precautions without assist  Transfers Overall transfer level: Needs assistance   Transfers: Sit to/from Stand Sit to Stand: Min guard         General transfer comment: cues for hand placement  Ambulation/Gait Ambulation/Gait assistance: Min guard Gait Distance (Feet): 500 Feet Assistive device: Rolling walker (2 wheeled) Gait Pattern/deviations: Step-through pattern;Decreased stride length   Gait velocity interpretation: >2.62 ft/sec, indicative of community ambulatory General Gait Details: pt with 4 standing rest breaks of grossly 10 sec each with good use of RW and pt self progressing gait   Stairs Stairs: Yes Stairs assistance: Supervision Stair Management: One rail Right;Step to pattern;Forwards Number of Stairs: 4 General stair comments:  cues for sequence and maintaining precautions with use of rail   Wheelchair Mobility    Modified Rankin (Stroke Patients Only)       Balance Overall balance assessment: No apparent balance deficits (not formally assessed)                                          Cognition Arousal/Alertness: Awake/alert Behavior During Therapy: WFL for tasks assessed/performed Overall Cognitive Status: Within Functional Limits for tasks assessed                                        Exercises General Exercises - Lower Extremity Long Arc Quad: AROM;Both;Seated;10 reps Hip Flexion/Marching: AROM;Both;Seated;10 reps    General Comments        Pertinent Vitals/Pain Pain Assessment: No/denies pain    Home Living                      Prior Function            PT Goals (current goals can now be found in the care plan section) Progress towards PT goals: Progressing toward goals    Frequency    Min 3X/week      PT Plan Current plan remains appropriate    Co-evaluation              AM-PAC PT "6 Clicks" Mobility   Outcome Measure  Help needed turning from  your back to your side while in a flat bed without using bedrails?: A Little Help needed moving from lying on your back to sitting on the side of a flat bed without using bedrails?: A Little Help needed moving to and from a bed to a chair (including a wheelchair)?: A Little Help needed standing up from a chair using your arms (e.g., wheelchair or bedside chair)?: A Little Help needed to walk in hospital room?: A Little Help needed climbing 3-5 steps with a railing? : A Little 6 Click Score: 18    End of Session   Activity Tolerance: Patient tolerated treatment well Patient left: in chair;with call bell/phone within reach Nurse Communication: Mobility status PT Visit Diagnosis: Other abnormalities of gait and mobility (R26.89);Difficulty in walking, not elsewhere classified  (R26.2)     Time: [id number] PT Time Calculation (min) (ACUTE ONLY): 27 min  Charges:  $Gait Training: 8-22 mins $Therapeutic Exercise: 8-22 mins                     [place], PT Acute Rehabilitation Services Pager: [phone] Office: [place] 03/07/2020, 12:25 PM

## 2020-03-08 LAB — GLUCOSE, CAPILLARY
Glucose-Capillary: 116 mg/dL — ABNORMAL HIGH (ref 70–99)
Glucose-Capillary: 135 mg/dL — ABNORMAL HIGH (ref 70–99)
Glucose-Capillary: 136 mg/dL — ABNORMAL HIGH (ref 70–99)
Glucose-Capillary: 155 mg/dL — ABNORMAL HIGH (ref 70–99)

## 2020-03-08 MED ORDER — CLONAZEPAM 0.25 MG PO TBDP
0.2500 mg | ORAL_TABLET | Freq: Every evening | ORAL | Status: DC | PRN
  Administered 2020-03-08: 0.25 mg via ORAL
  Filled 2020-03-08: qty 1

## 2020-03-08 MED ORDER — AMLODIPINE BESYLATE 5 MG PO TABS
5.0000 mg | ORAL_TABLET | Freq: Every day | ORAL | Status: DC
  Administered 2020-03-08 – 2020-03-09 (×2): 5 mg via ORAL
  Filled 2020-03-08 (×2): qty 1

## 2020-03-08 NOTE — Progress Notes (Addendum)
      [address]       [place] [address]             [phone]      6 Days Post-Op Procedure(s) (LRB): CORONARY ARTERY BYPASS GRAFTING (CABG)x 3, LIMA TO LAD, SVG TO DIAGONAL, SVG TO RCA, WITH OPEN HARVESTING OF LEFT LOWER GREATER SAPHENOUS VEIN AND RIGHT AND LEFT GREATER SAPHENOUS VEIN HARVESTED ENDOSCOPICALLY (N/A) TRANSESOPHAGEAL ECHOCARDIOGRAM (TEE) (N/A) Endovein Harvest Of Greater Saphenous Vein (Bilateral) Subjective: Feels okay this morning. Her right shoulder is still sore and her right forearm  Objective: Vital signs in last 24 hours: Temp:  [96.5 F (35.8 C)-99.5 F (37.5 C)] 98.6 F (37 C) (04/27 0353) Pulse Rate:  [81-104] 92 (04/27 0353) Cardiac Rhythm: Normal sinus rhythm (04/27 0353) Resp:  [17-21] 18 (04/27 0559) BP: (133-178)/(70-86) 178/81 (04/27 0353) SpO2:  [92 %-96 %] 94 % (04/27 0353) Weight:  [50.7 kg] 50.7 kg (04/27 0559)     Intake/Output from previous day: 04/26 0701 - 04/27 0700 In: 360 [P.O.:360] Out: 2700 [Urine:2700] Intake/Output this shift: No intake/output data recorded.  General appearance: alert, cooperative and no distress Heart: regular rate and rhythm, S1, S2 normal, no murmur, click, rub or gallop Lungs: clear to auscultation bilaterally Abdomen: soft, non-tender; bowel sounds normal; no masses,  no organomegaly Extremities: extremities normal, atraumatic, no cyanosis or edema Wound: clean and dry  Lab Results: Recent Labs    03/07/20 0913  WBC 7.5  HGB 12.0  HCT 37.9  PLT 234   BMET:  Recent Labs    03/06/20 0402 03/07/20 0913  NA 135 137  K 3.6 3.9  CL 99 102  CO2 27 28  GLUCOSE 130* 148*  BUN 19 16  CREATININE 0.78 0.78  CALCIUM 8.9 9.4    PT/INR: No results for input(s): LABPROT, INR in the last 72 hours. ABG    Component Value Date/Time   PHART 7.352 03/03/2020 0025   HCO3 22.9 03/03/2020 0025   TCO2 24 03/03/2020 0025   ACIDBASEDEF 3.0 (H) 03/03/2020 0025   O2SAT 100.0  03/03/2020 0025   CBG (last 3)  Recent Labs    03/07/20 1622 03/07/20 2139 03/08/20 0555  GLUCAP 107* 160* 116*    Assessment/Plan: S/P Procedure(s) (LRB): CORONARY ARTERY BYPASS GRAFTING (CABG)x 3, LIMA TO LAD, SVG TO DIAGONAL, SVG TO RCA, WITH OPEN HARVESTING OF LEFT LOWER GREATER SAPHENOUS VEIN AND RIGHT AND LEFT GREATER SAPHENOUS VEIN HARVESTED ENDOSCOPICALLY (N/A) TRANSESOPHAGEAL ECHOCARDIOGRAM (TEE) (N/A) Endovein Harvest Of Greater Saphenous Vein (Bilateral)  1. CV- post-op atrial fibrillation. Now in NSR. Increase Norvasc. Continue metoprolol, statin, and asa.  2. Pulm-tolerating room air with good oxygen saturation. 3. Renal-creatinine 0.78, electrolytes okay. Continue Lasix 40mg  PO 4. H and H 12.0/37.9, expected acute blood loss anemia 5. Endo-blood glucose well controlled.  6. Right shoulder pain-apply heat and continue tylenol. Right forearm IV site is sore-apply ice.   Plan: Remove EPW today. Watch shoulder-PT to work with. Continue to ambulate 3x daily. Continue to use incentive spirometer. Hopefully able to go home soon with her daughter and home health.    LOS: 6 days    Elgie Collard 03/08/2020   Chart reviewed, patient examined, agree with above. She feels better today. Only complaint is that she isn't sleeping much. Anxious to go home. Swallowing eval normal.  I think she can go home tomorrow with daughter if no changes. Daughter is with her and agrees.

## 2020-03-08 NOTE — Progress Notes (Signed)
Physical Therapy Treatment Patient Details Name: [name] MRN: [id number] DOB: Apr 29, 1936 Today's Date: 03/08/2020    History of Present Illness Pt adm for CABG x 3 on 03/02/20. PMH - HTN, CAD, lt CEA, atherosclerotic ulcer of abdominal aorta with stent graft, renal artery stenosis with stents.     PT Comments    Pt doing very well and ready for dc home tomorrow.    Follow Up Recommendations  Home health PT;Supervision - Intermittent     Equipment Recommendations  None recommended by PT    Recommendations for Other Services       Precautions / Restrictions Precautions Precautions: Sternal    Mobility  Bed Mobility Overal bed mobility: Modified Independent Bed Mobility: Supine to Sit;Sit to Supine     Supine to sit: Modified independent (Device/Increase time);HOB elevated Sit to supine: Modified independent (Device/Increase time);HOB elevated      Transfers Overall transfer level: Modified independent Equipment used: None;4-wheeled walker Transfers: Sit to/from Stand Sit to Stand: Modified independent (Device/Increase time)            Ambulation/Gait Ambulation/Gait assistance: Supervision Gait Distance (Feet): 300 Feet Assistive device: 4-wheeled walker Gait Pattern/deviations: Step-through pattern;Decreased stride length Gait velocity: decr Gait velocity interpretation: >2.62 ft/sec, indicative of community ambulatory General Gait Details: 3 standing rest breaks. Amb on RA with VSS   Stairs             Wheelchair Mobility    Modified Rankin (Stroke Patients Only)       Balance Overall balance assessment: No apparent balance deficits (not formally assessed)                                          Cognition Arousal/Alertness: Awake/alert Behavior During Therapy: WFL for tasks assessed/performed Overall Cognitive Status: Within Functional Limits for tasks assessed                                        Exercises      General Comments        Pertinent Vitals/Pain Pain Assessment: No/denies pain    Home Living                      Prior Function            PT Goals (current goals can now be found in the care plan section) Acute Rehab PT Goals Patient Stated Goal: return home Progress towards PT goals: Progressing toward goals    Frequency    Min 3X/week      PT Plan Current plan remains appropriate    Co-evaluation              AM-PAC PT "6 Clicks" Mobility   Outcome Measure  Help needed turning from your back to your side while in a flat bed without using bedrails?: None Help needed moving from lying on your back to sitting on the side of a flat bed without using bedrails?: None Help needed moving to and from a bed to a chair (including a wheelchair)?: None Help needed standing up from a chair using your arms (e.g., wheelchair or bedside chair)?: None Help needed to walk in hospital room?: None Help needed climbing 3-5 steps with a railing? : A Little 6 Click Score: 23  End of Session         PT Visit Diagnosis: Other abnormalities of gait and mobility (R26.89);Difficulty in walking, not elsewhere classified (R26.2)     Time: [id number] PT Time Calculation (min) (ACUTE ONLY): 10 min  Charges:  $Gait Training: 8-22 mins                     [place] Pager [phone] Office [place] 03/08/2020, 2:09 PM

## 2020-03-08 NOTE — Progress Notes (Signed)
CARDIAC REHAB PHASE I   PRE:  Rate/Rhythm: 93 SR  BP:  Supine: 135/77  Sitting:   Standing:    SaO2: 93%RA  MODE:  Ambulation: 470 ft   POST:  Rate/Rhythm: 107 ST  BP:  Supine:   Sitting: 163/82  Standing:    SaO2: 95%RA [id number] Pt walked 470 ft on RA with rolling walker adhering to sternal precautions. Tolerated well. Right hand and arm still bothering pt. To bathroom and then to bed for pacing wire removal.   [name], RN BSN  03/08/2020 9:02 AM

## 2020-03-08 NOTE — Care Management Important Message (Signed)
Important Message  Patient Details  Name: [name] MRN: [id number] Date of Birth: Apr 10, 1936   Medicare Important Message Given:  Yes     Shelda Altes 03/08/2020, 9:41 AM

## 2020-03-08 NOTE — Progress Notes (Signed)
EPW removed per order. Tips intact. VSS. Pt educated on 1 hour bedrest. Call light in reach.  Carrington Mullenax, RN  

## 2020-03-08 NOTE — TOC Progression Note (Addendum)
Transition of Care [facility]) - Progression Note    Patient Details  Name: AMBROSIA USRY MRN: [id number] Date of Birth: 08-Sep-1936  Transition of Care [facility]) CM/SW [place], RN Phone Number: [phone] 03/08/2020, 12:32 PM  Clinical Narrative:    Patient admitted with CAD, CABG X3 4/21  Patient has progressed and is on telemetry stepdown.  Has walker at home, Rolator and regular.  Has shower chair Lives alone, daughter lives 15 minutes away and can help patient when she gets home. As well, she has friends to check on her. . PT evaluation recommends PT at home.  List of Home health agencies given to patient. [email or web address]. Will follow up with patient for decision. for home health and any other needs.     Expected Discharge Plan: [place] Barriers to Discharge: No Barriers Identified  Expected Discharge Plan and Services Expected Discharge Plan: [place]   Discharge Planning Services: CM Consult Post Acute Care Choice: [place] arrangements for the past 2 months: Single Family Home                                       Social Determinants of Health (SDOH) Interventions    Readmission Risk Interventions No flowsheet data found.

## 2020-03-08 NOTE — Evaluation (Signed)
Clinical/Bedside Swallow Evaluation Patient Details  Name: [name] MRN: [id number] Date of Birth: 30-Jun-1936  Today's Date: 03/08/2020 Time: SLP Start Time (ACUTE ONLY): 1545 SLP Stop Time (ACUTE ONLY): 1556 SLP Time Calculation (min) (ACUTE ONLY): 11 min  Past Medical History:  Past Medical History:  Diagnosis Date  . Anemia   . Anxiety   . Arthritis   . Chronic constipation   . Claudication [facility])    Chronic occlusion of the right limb of aortic stent graft.  . Complication of anesthesia 1982   "rapid heart beat"  . Diverticulosis    Left sided noted on colonoscopy 02/2006  . Dyspnea    with exertion  . Ectopic atrial tachycardia ([place])   . Esophageal reflux disease   . Essential hypertension   . Hiatal hernia   . Hyperlipidemia   . Lumbar radiculopathy   . Macular degeneration   . Migraines    migraines  . Non-obstructive CAD    a. Reportedly nonobstructive at catheterization in the 1990s;  b. 03/2014 Cath: LM nl, LAD 30-40p, 38m, D1 20ost, D2 50ost/p, D3 small, LCX nl/nondominant, OM 1/2/3 nl, RCA 20-30p, 63m, PDA nl, RPL1/2  small, nl.  . Penetrating atherosclerotic ulcer of aorta (HCC)    Complex ulcerated plaque of the infrarenal abdominal aorta mild renal artery stenosis on the right side, normal left renal artery catheterization 2009, status post aortic stent graft  . PONV (postoperative nausea and vomiting)   . Renal artery stenosis ([place])    a. Right renal artery stent April 2013 - Dr. Fletcher Anon;  b. 03/2014 PTA/stenting of RRA 2/2 ISR: 6x18 Herculink stent.  . Stroke ([place])    TIA  . Trigeminal neuralgia    Right  . Type 2 diabetes mellitus (HCC)    diet controlled   Past Surgical History:  Past Surgical History:  Procedure Laterality Date  . ABDOMINAL AORTAGRAM N/A 02/13/2012   Procedure: ABDOMINAL Maxcine Ham;  Surgeon: Wellington Hampshire, MD;  Location: [place] CATH LAB;  Service: Cardiovascular;  Laterality: N/A;  . ABDOMINAL AORTAGRAM N/A 03/17/2014   Procedure:  ABDOMINAL Maxcine Ham;  Surgeon: Wellington Hampshire, MD;  Location: [place] CATH LAB;  Service: Cardiovascular;  Laterality: N/A;  . ABDOMINAL HYSTERECTOMY  1982  . BREAST BIOPSY Left 1990's  . CAROTID ENDARTERECTOMY Left 1993  . COLONOSCOPY    . CORONARY ARTERY BYPASS GRAFT N/A 03/02/2020   Procedure: CORONARY ARTERY BYPASS GRAFTING (CABG)x 3, LIMA TO LAD, SVG TO DIAGONAL, SVG TO RCA, WITH OPEN HARVESTING OF LEFT LOWER GREATER SAPHENOUS VEIN AND RIGHT AND LEFT GREATER SAPHENOUS VEIN HARVESTED ENDOSCOPICALLY;  Surgeon: Grace Isaac, MD;  Location: [place];  Service: Open Heart Surgery;  Laterality: N/A;  . Endologix powerlink bifurcated     Covered stent graft  . ENDOVEIN HARVEST OF GREATER SAPHENOUS VEIN Bilateral 03/02/2020   Procedure: [place] Of Greater Saphenous Vein;  Surgeon: Grace Isaac, MD;  Location: [place];  Service: Open Heart Surgery;  Laterality: Bilateral;  . LEFT HEART CATH AND CORONARY ANGIOGRAPHY N/A 01/28/2020   Procedure: LEFT HEART CATH AND CORONARY ANGIOGRAPHY;  Surgeon: Nelva Bush, MD;  Location: [place] CV LAB;  Service: Cardiovascular;  Laterality: N/A;  . LEFT HEART CATHETERIZATION WITH CORONARY ANGIOGRAM N/A 03/17/2014   Procedure: LEFT HEART CATHETERIZATION WITH CORONARY ANGIOGRAM;  Surgeon: Wellington Hampshire, MD;  Location: [place] CATH LAB;  Service: Cardiovascular;  Laterality: N/A;  . LUMBAR [place]  . PERCUTANEOUS STENT INTERVENTION Right 03/17/2014  Procedure: PERCUTANEOUS STENT INTERVENTION;  Surgeon: Wellington Hampshire, MD;  Location: [place] CATH LAB;  Service: Cardiovascular;  Laterality: Right;  Renal  . RENAL ANGIOGRAM N/A 02/13/2012   Procedure: RENAL ANGIOGRAM;  Surgeon: Wellington Hampshire, MD;  Location: [place] CATH LAB;  Service: Cardiovascular;  Laterality: N/A;  . RENAL ANGIOGRAM N/A 03/17/2014   Procedure: RENAL ANGIOGRAM;  Surgeon: Wellington Hampshire, MD;  Location: [place] CATH LAB;  Service: Cardiovascular;  Laterality: N/A;  . TEE WITHOUT  CARDIOVERSION N/A 03/02/2020   Procedure: TRANSESOPHAGEAL ECHOCARDIOGRAM (TEE);  Surgeon: Grace Isaac, MD;  Location: [place];  Service: Open Heart Surgery;  Laterality: N/A;  . TONSILLECTOMY AND ADENOIDECTOMY  1947   HPI:  Pt adm for CABG x 3 on 03/02/20. PMH - HTN, CAD, lt CEA, atherosclerotic ulcer of abdominal aorta with stent graft, renal artery stenosis with stents..  Pt reported to Dr. Cyndia Bent 4/26 that she feels that food is getting stuck; therefore, swallow evaluation ordered.    Assessment / Plan / Recommendation Clinical Impression  Pt presents with functional swallowing marked by adequate mastication, brisk swallow response, no s/s of aspiration.  Pt endorses globus, especially yesterday, but states it is improved today. There are no other overt signs of esophageal deficits.  Recommended to pt that she let her physician know and request GI consult if s/s persist over the next week and do not improve.  There are no concerns for an oropharyngeal dysphagia.  Recommend continuing regular diet, thin liquids.  No furthe SLP f/u is warranted.  SLP Visit Diagnosis: Dysphagia, unspecified (R13.10)    Aspiration Risk  No limitations    Diet Recommendation   continue regular solids, thin liquids  Medication Administration: Whole meds with liquid    Other  Recommendations     Follow up Recommendations None      Frequency and Duration            Prognosis        Swallow Study   General HPI: Pt adm for CABG x 3 on 03/02/20. PMH - HTN, CAD, lt CEA, atherosclerotic ulcer of abdominal aorta with stent graft, renal artery stenosis with stents..  Pt reported to Dr. Cyndia Bent 4/26 that she feels that food is getting stuck; therefore, swallow evaluation ordered.  Type of Study: Bedside Swallow Evaluation Previous Swallow Assessment: no Diet Prior to this Study: Regular;Thin liquids Temperature Spikes Noted: No Respiratory Status: Room air History of Recent Intubation: Yes Length of  Intubations (days): (for surgery) Behavior/Cognition: Alert;Cooperative;Pleasant mood Oral Cavity Assessment: Within Functional Limits Oral Care Completed by SLP: No Oral Cavity - Dentition: Adequate natural dentition Vision: Functional for self-feeding Self-Feeding Abilities: Able to feed self Patient Positioning: Upright in bed Baseline Vocal Quality: Normal Volitional Cough: Strong Volitional Swallow: Able to elicit    Oral/Motor/Sensory Function Overall Oral Motor/Sensory Function: Within functional limits   Ice Chips Ice chips: Not tested   Thin Liquid Thin Liquid: Within functional limits    Nectar Thick Nectar Thick Liquid: Not tested   Honey Thick Honey Thick Liquid: Not tested   Puree Puree: Within functional limits   Solid     Solid: Within functional limits      Juan Quam Laurice 03/08/2020,4:01 PM  Estill Bamberg L. Tivis Ringer, [place] Office number [phone] Pager [phone]

## 2020-03-09 ENCOUNTER — Ambulatory Visit: Payer: Medicare Other | Admitting: Cardiology

## 2020-03-09 LAB — GLUCOSE, CAPILLARY
Glucose-Capillary: 139 mg/dL — ABNORMAL HIGH (ref 70–99)
Glucose-Capillary: 181 mg/dL — ABNORMAL HIGH (ref 70–99)

## 2020-03-09 MED ORDER — ASPIRIN 325 MG PO TBEC: 325.0000 mg | DELAYED_RELEASE_TABLET | Freq: Every day | ORAL | 0 refills | Status: DC

## 2020-03-09 MED ORDER — TRAMADOL HCL 50 MG PO TABS: 50.0000 mg | ORAL_TABLET | Freq: Four times a day (QID) | ORAL | 0 refills | Status: DC | PRN

## 2020-03-09 MED ORDER — METOPROLOL TARTRATE 25 MG PO TABS
25.0000 mg | ORAL_TABLET | Freq: Once | ORAL | Status: AC
  Administered 2020-03-09: 25 mg via ORAL
  Filled 2020-03-09: qty 1

## 2020-03-09 MED ORDER — AMLODIPINE BESYLATE 5 MG PO TABS: 5.0000 mg | ORAL_TABLET | Freq: Every day | ORAL | 1 refills | Status: DC

## 2020-03-09 MED ORDER — METOPROLOL TARTRATE 50 MG PO TABS: 50.0000 mg | ORAL_TABLET | Freq: Two times a day (BID) | ORAL | 1 refills | Status: DC

## 2020-03-09 MED ORDER — AMIODARONE HCL 200 MG PO TABS
200.0000 mg | ORAL_TABLET | Freq: Two times a day (BID) | ORAL | 1 refills | Status: DC
Start: 2020-03-09 — End: 2020-03-22

## 2020-03-09 MED ORDER — METOPROLOL TARTRATE 25 MG PO TABS: 25.0000 mg | ORAL_TABLET | Freq: Two times a day (BID) | ORAL | 1 refills | Status: DC

## 2020-03-09 MED FILL — Mannitol IV Soln 20%: INTRAVENOUS | Qty: 500 | Status: AC

## 2020-03-09 MED FILL — Heparin Sodium (Porcine) Inj 1000 Unit/ML: INTRAMUSCULAR | Qty: 10 | Status: AC

## 2020-03-09 MED FILL — Sodium Chloride IV Soln 0.9%: INTRAVENOUS | Qty: 2000 | Status: AC

## 2020-03-09 MED FILL — Magnesium Sulfate Inj 50%: INTRAMUSCULAR | Qty: 10 | Status: AC

## 2020-03-09 MED FILL — Potassium Chloride Inj 2 mEq/ML: INTRAVENOUS | Qty: 40 | Status: AC

## 2020-03-09 MED FILL — Heparin Sodium (Porcine) Inj 1000 Unit/ML: INTRAMUSCULAR | Qty: 30 | Status: AC

## 2020-03-09 MED FILL — Sodium Bicarbonate IV Soln 8.4%: INTRAVENOUS | Qty: 50 | Status: AC

## 2020-03-09 MED FILL — Lidocaine HCl Local Soln Prefilled Syringe 100 MG/5ML (2%): INTRAMUSCULAR | Qty: 5 | Status: AC

## 2020-03-09 MED FILL — Electrolyte-R (PH 7.4) Solution: INTRAVENOUS | Qty: 3000 | Status: AC

## 2020-03-09 NOTE — TOC Transition Note (Addendum)
Transition of Care [facility]) - CM/SW Discharge Note   Patient Details  Name: [name] MRN: [id number] Date of Birth: [birth year]  [facility] CM/SW Contact:  Verdell Carmine, RN Phone Number: 03/09/2020, 10:27 AM   Clinical Narrative:    Patient set up with [facility] for home PT. And RN services  Pollie Meyer accepted patient.  Patient is being discharged today.    Final next level of care: [place] Barriers to Discharge: No Barriers Identified   Patient Goals and CMS Choice Patient states their goals for this hospitalization and ongoing recovery are:: TO go home CMS Medicare.gov Compare Post Acute Care list provided to:: Patient Choice offered to / list presented to : Patient  Discharge Placement             Home with home health-PT          Discharge Plan and Services   Discharge Planning Services: CM Consult Post Acute Care Choice: Home Health                               Social Determinants of Health (SDOH) Interventions     Readmission Risk Interventions No flowsheet data found.

## 2020-03-09 NOTE — Progress Notes (Signed)
Discharge instructions provided to patient and patients daughter. All medications, follow up appointments, and discharge instructions reviewed. IV out. Monitor off CCMD notified. Patient discharging to home.  Paulene Floor, RN

## 2020-03-09 NOTE — Progress Notes (Signed)
Patient anxious this morning, states she has not had good sleep, Bp elevated. Has not made decision yet regarding [place]. wiaiting for daughter to come.

## 2020-03-09 NOTE — Progress Notes (Addendum)
      [address]       ,[place] [address]             [phone]      7 Days Post-Op Procedure(s) (LRB): CORONARY ARTERY BYPASS GRAFTING (CABG)x 3, LIMA TO LAD, SVG TO DIAGONAL, SVG TO RCA, WITH OPEN HARVESTING OF LEFT LOWER GREATER SAPHENOUS VEIN AND RIGHT AND LEFT GREATER SAPHENOUS VEIN HARVESTED ENDOSCOPICALLY (N/A) TRANSESOPHAGEAL ECHOCARDIOGRAM (TEE) (N/A) Endovein Harvest Of Greater Saphenous Vein (Bilateral) Subjective: Shoulder is feeling better today. She did not get her heating pad yesterday as ordered. She is excited to go home and finally get some sleep.   Objective: Vital signs in last 24 hours: Temp:  [97.5 F (36.4 C)-98.4 F (36.9 C)] 97.8 F (36.6 C) (04/28 0412) Pulse Rate:  [76-95] 93 (04/28 0412) Cardiac Rhythm: Normal sinus rhythm (04/27 1900) Resp:  [15-19] 19 (04/28 0412) BP: (112-159)/(63-78) 150/73 (04/28 0412) SpO2:  [91 %-97 %] 93 % (04/28 0412) Weight:  [51 kg] 51 kg (04/28 0428)  Intake/Output from previous day: 04/27 0701 - 04/28 0700 In: 220 [P.O.:220] Out: 850 [Urine:850] Intake/Output this shift: No intake/output data recorded.  General appearance: alert, cooperative and no distress Heart: regular rate and rhythm, S1, S2 normal, no murmur, click, rub or gallop Lungs: clear to auscultation bilaterally Abdomen: soft, non-tender; bowel sounds normal; no masses,  no organomegaly Extremities: extremities normal, atraumatic, no cyanosis or edema Wound: clean and dry  Lab Results: Recent Labs    03/07/20 0913  WBC 7.5  HGB 12.0  HCT 37.9  PLT 234   BMET:  Recent Labs    03/07/20 0913  NA 137  K 3.9  CL 102  CO2 28  GLUCOSE 148*  BUN 16  CREATININE 0.78  CALCIUM 9.4    PT/INR: No results for input(s): LABPROT, INR in the last 72 hours. ABG    Component Value Date/Time   PHART 7.352 03/03/2020 0025   HCO3 22.9 03/03/2020 0025   TCO2 24 03/03/2020 0025   ACIDBASEDEF 3.0 (H) 03/03/2020 0025   O2SAT 100.0  03/03/2020 0025   CBG (last 3)  Recent Labs    03/08/20 1616 03/08/20 2227 03/09/20 0619  GLUCAP 136* 155* 139*    Assessment/Plan: S/P Procedure(s) (LRB): CORONARY ARTERY BYPASS GRAFTING (CABG)x 3, LIMA TO LAD, SVG TO DIAGONAL, SVG TO RCA, WITH OPEN HARVESTING OF LEFT LOWER GREATER SAPHENOUS VEIN AND RIGHT AND LEFT GREATER SAPHENOUS VEIN HARVESTED ENDOSCOPICALLY (N/A) TRANSESOPHAGEAL ECHOCARDIOGRAM (TEE) (N/A) Endovein Harvest Of Greater Saphenous Vein (Bilateral)  1. CV- post-op atrial fibrillation. Now in NSR. Increase Norvasc. EPW removed. Continue metoprolol, statin, and asa.  2. Pulm-tolerating room air with good oxygen saturation. 3. Renal-creatinine 0.78, electrolytes okay. Continue Lasix 40mg  PO 4. H and H 12.0/37.9, expected acute blood loss anemia 5. Endo-blood glucose well controlled.  6. Right shoulder pain-apply heat and continue tylenol. Right forearm IV site is sore-apply ice. Improving.   Plan: Discharge today. Continue to ambulate 3x daily at home. Continue to use incentive spirometer. Ordered home PT and RN for assistance. All questions answered to the patient's satisfaction. Hypertensive this morning- will increase metoprolol to 50 BID. She was on 100 BID of labetalol at home.     LOS: 7 days    Elgie Collard 03/09/2020

## 2020-03-09 NOTE — Progress Notes (Signed)
1020-1050 Discussed sternal precautions and staying in the tube. Encouraged IS, walking for ex, and gave diabetic and heart healthy diets. Discussed CRP 2 and referred to [facility] program. Pt did not want to watch discharge video. Encouraged her to have daughter read ed materials. Pt voiced understanding of ed. [name] RN BSN 03/09/2020 10:49 AM

## 2020-03-10 DIAGNOSIS — Z48812 Encounter for surgical aftercare following surgery on the circulatory system: Secondary | ICD-10-CM | POA: Diagnosis not present

## 2020-03-10 DIAGNOSIS — M6281 Muscle weakness (generalized): Secondary | ICD-10-CM | POA: Diagnosis not present

## 2020-03-10 DIAGNOSIS — I10 Essential (primary) hypertension: Secondary | ICD-10-CM | POA: Diagnosis not present

## 2020-03-10 DIAGNOSIS — H3530 Unspecified macular degeneration: Secondary | ICD-10-CM | POA: Diagnosis not present

## 2020-03-10 DIAGNOSIS — I6523 Occlusion and stenosis of bilateral carotid arteries: Secondary | ICD-10-CM | POA: Diagnosis not present

## 2020-03-10 DIAGNOSIS — I701 Atherosclerosis of renal artery: Secondary | ICD-10-CM | POA: Diagnosis not present

## 2020-03-10 DIAGNOSIS — M5416 Radiculopathy, lumbar region: Secondary | ICD-10-CM | POA: Diagnosis not present

## 2020-03-10 DIAGNOSIS — I70203 Unspecified atherosclerosis of native arteries of extremities, bilateral legs: Secondary | ICD-10-CM | POA: Diagnosis not present

## 2020-03-10 DIAGNOSIS — Z95820 Peripheral vascular angioplasty status with implants and grafts: Secondary | ICD-10-CM | POA: Diagnosis not present

## 2020-03-10 DIAGNOSIS — Z959 Presence of cardiac and vascular implant and graft, unspecified: Secondary | ICD-10-CM | POA: Diagnosis not present

## 2020-03-10 DIAGNOSIS — I714 Abdominal aortic aneurysm, without rupture: Secondary | ICD-10-CM | POA: Diagnosis not present

## 2020-03-10 DIAGNOSIS — E119 Type 2 diabetes mellitus without complications: Secondary | ICD-10-CM | POA: Diagnosis not present

## 2020-03-10 DIAGNOSIS — I25110 Atherosclerotic heart disease of native coronary artery with unstable angina pectoris: Secondary | ICD-10-CM | POA: Diagnosis not present

## 2020-03-11 DIAGNOSIS — I10 Essential (primary) hypertension: Secondary | ICD-10-CM | POA: Diagnosis not present

## 2020-03-14 DIAGNOSIS — M6281 Muscle weakness (generalized): Secondary | ICD-10-CM | POA: Diagnosis not present

## 2020-03-14 DIAGNOSIS — E119 Type 2 diabetes mellitus without complications: Secondary | ICD-10-CM | POA: Diagnosis not present

## 2020-03-14 DIAGNOSIS — I70203 Unspecified atherosclerosis of native arteries of extremities, bilateral legs: Secondary | ICD-10-CM | POA: Diagnosis not present

## 2020-03-14 DIAGNOSIS — I25110 Atherosclerotic heart disease of native coronary artery with unstable angina pectoris: Secondary | ICD-10-CM | POA: Diagnosis not present

## 2020-03-14 DIAGNOSIS — I714 Abdominal aortic aneurysm, without rupture: Secondary | ICD-10-CM | POA: Diagnosis not present

## 2020-03-14 DIAGNOSIS — Z48812 Encounter for surgical aftercare following surgery on the circulatory system: Secondary | ICD-10-CM | POA: Diagnosis not present

## 2020-03-15 ENCOUNTER — Other Ambulatory Visit: Payer: Self-pay

## 2020-03-15 ENCOUNTER — Other Ambulatory Visit: Payer: Self-pay | Admitting: Cardiothoracic Surgery

## 2020-03-15 ENCOUNTER — Telehealth: Payer: Self-pay

## 2020-03-15 DIAGNOSIS — I25118 Atherosclerotic heart disease of native coronary artery with other forms of angina pectoris: Secondary | ICD-10-CM

## 2020-03-15 MED ORDER — POTASSIUM CHLORIDE CRYS ER 10 MEQ PO TBCR
10.0000 meq | EXTENDED_RELEASE_TABLET | Freq: Every day | ORAL | 1 refills | Status: DC
Start: 2020-03-15 — End: 2020-04-07

## 2020-03-15 MED ORDER — FUROSEMIDE 20 MG PO TABS
20.0000 mg | ORAL_TABLET | Freq: Every day | ORAL | 1 refills | Status: DC
Start: 2020-03-15 — End: 2020-04-07

## 2020-03-15 NOTE — Telephone Encounter (Addendum)
Maudie Mercury, pt's nurse from [facility], left VM to report that pt has swelling to lower L calf and from L medial ankle to "halfway down" L medial foot. Pt is s/p CABG by Dr. Servando Snare on 03/02/20. Nurse reports slight redness to this area, but says incision sight to L leg shows no s/s of infection. Pt is afebrile. Pt denies pain and also denies heat to the area. Spoke w/ pt's dgt and she confirms this information and says pt has no additional complaints. Consulted Dr. Servando Snare; new orders for vascular duplex study of L lower extremity tomorrow and for OV on Thursday, also for Lasix 20 mg daily x 2 weeks and Potassium 10 mEq daily x 2 weeks. Rx's sent to [facility]. Pt and daughter aware of medications and of appt for venous duplex at [facility] 03/16/20 @ 2:30 and OV w/ Dr. Servando Snare on 03/17/20 @ 2:30.

## 2020-03-15 NOTE — Progress Notes (Signed)
See 03/15/20 telephone encounter note.

## 2020-03-16 ENCOUNTER — Ambulatory Visit (HOSPITAL_COMMUNITY): Payer: Medicare Other

## 2020-03-16 ENCOUNTER — Other Ambulatory Visit: Payer: Self-pay

## 2020-03-16 ENCOUNTER — Ambulatory Visit (HOSPITAL_COMMUNITY)
Admission: RE | Admit: 2020-03-16 | Discharge: 2020-03-16 | Disposition: A | Discharge status: Home / Self Care | Payer: Medicare Other | Source: Ambulatory Visit | Attending: Cardiothoracic Surgery | Admitting: Cardiothoracic Surgery

## 2020-03-16 DIAGNOSIS — E119 Type 2 diabetes mellitus without complications: Secondary | ICD-10-CM | POA: Diagnosis not present

## 2020-03-16 DIAGNOSIS — I25110 Atherosclerotic heart disease of native coronary artery with unstable angina pectoris: Secondary | ICD-10-CM | POA: Diagnosis not present

## 2020-03-16 DIAGNOSIS — M25572 Pain in left ankle and joints of left foot: Secondary | ICD-10-CM | POA: Diagnosis not present

## 2020-03-16 DIAGNOSIS — I70203 Unspecified atherosclerosis of native arteries of extremities, bilateral legs: Secondary | ICD-10-CM | POA: Diagnosis not present

## 2020-03-16 DIAGNOSIS — M6281 Muscle weakness (generalized): Secondary | ICD-10-CM | POA: Diagnosis not present

## 2020-03-16 DIAGNOSIS — Z48812 Encounter for surgical aftercare following surgery on the circulatory system: Secondary | ICD-10-CM | POA: Diagnosis not present

## 2020-03-16 DIAGNOSIS — I714 Abdominal aortic aneurysm, without rupture: Secondary | ICD-10-CM | POA: Diagnosis not present

## 2020-03-16 DIAGNOSIS — I25118 Atherosclerotic heart disease of native coronary artery with other forms of angina pectoris: Secondary | ICD-10-CM

## 2020-03-16 NOTE — Progress Notes (Signed)
[address]       [place] [id number]             [phone]      Kenlee M Flamenco [place] Medical Record [id number] Date of Birth: 12-23-1935  Referring: [name], MD Primary Care: Glenda Chroman, MD Primary Cardiologist: Rozann Lesches, MD   Chief Complaint:   POST OP FOLLOW UP OPERATIVE REPORT DATE OF PROCEDURE:  03/02/2020 PREOPERATIVE DIAGNOSES:  Unstable angina with severe 2-vessel coronary artery disease. POSTOPERATIVE DIAGNOSES:  Unstable angina with severe 2-vessel coronary artery disease. SURGICAL PROCEDURE:  Coronary artery bypass grafting x3 with the left internal mammary artery to the left anterior descending coronary artery, reverse saphenous vein graft to the distal right coronary artery, reverse saphenous vein graft to the first  diagonal coronary artery with endoscopic vein harvesting, right and left greater saphenous veins, thigh open vein harvesting, left greater saphenous vein lower leg.  History of Present Illness:     Patient returns to the office today in follow-up after recent coronary artery bypass grafting on April 21, she has been doing relatively well walking around her house without significant shortness of breath weakness.  She did call yesterday because of increased swelling in the left leg, especially the lower leg where vein was harvested from with open technique.  Duplex scanning of the left leg showed no evidence of DVT yesterday.     Past Medical History:  Diagnosis Date  . Anemia   . Anxiety   . Arthritis   . Chronic constipation   . Claudication [facility])    Chronic occlusion of the right limb of aortic stent graft.  . Complication of anesthesia 1982   "rapid heart beat"  . Diverticulosis    Left sided noted on colonoscopy 02/2006  . Dyspnea    with exertion  . Ectopic atrial tachycardia ([place])   . Esophageal reflux disease   . Essential hypertension   . Hiatal hernia   . Hyperlipidemia   . Lumbar  radiculopathy   . Macular degeneration   . Migraines    migraines  . Non-obstructive CAD    a. Reportedly nonobstructive at catheterization in the 1990s;  b. 03/2014 Cath: LM nl, LAD 30-40p, 29m, D1 20ost, D2 50ost/p, D3 small, LCX nl/nondominant, OM 1/2/3 nl, RCA 20-30p, 72m, PDA nl, RPL1/2  small, nl.  . Penetrating atherosclerotic ulcer of aorta (HCC)    Complex ulcerated plaque of the infrarenal abdominal aorta mild renal artery stenosis on the right side, normal left renal artery catheterization 2009, status post aortic stent graft  . PONV (postoperative nausea and vomiting)   . Renal artery stenosis ([place])    a. Right renal artery stent April 2013 - Dr. Fletcher Anon;  b. 03/2014 PTA/stenting of RRA 2/2 ISR: 6x18 Herculink stent.  . Stroke ([place])    TIA  . Trigeminal neuralgia    Right  . Type 2 diabetes mellitus (HCC)    diet controlled     Social History   Tobacco Use  Smoking Status Former Smoker  . Packs/day: 0.20  . Years: 1.00  . Pack years: 0.20  . Types: Cigarettes  . Quit date: 11/13/1979  . Years since quitting: 40.3  Smokeless Tobacco Never Used  Tobacco Comment   "smoked a few months"    Social History   Substance and Sexual Activity  Alcohol Use No  . Alcohol/week: 0.0 standard drinks     Allergies  Allergen Reactions  .  Cefixime Shortness Of Breath    Patient is not familiar with this listed allergy  . Conjugated Estrogens Other (See Comments)    REACTION: flushing  . Morphine And Related Other (See Comments)    Morphine injection Neck pain  . Codeine Other (See Comments)    REACTION: dizziness  . Iohexol      Desc: pt has anxiety and an irregular heartbeat. contrast exacerbates this. pt was very uncomfortable after a 20cc bolus for an miroi. we did w/o iv 11/09 per dr. Kris Hartmann. pt will talk w/ md about this and will probably not get iv contrast in the future at her re, Onset Date: [id number]   . Adenosine Other (See Comments)    Elevated heart rate when  doing stress test    Current Outpatient Medications  Medication Sig Dispense Refill  . acetaminophen (TYLENOL) 325 MG tablet Take 325 mg by mouth every 6 (six) hours as needed. For pain.    Marland Kitchen amiodarone (PACERONE) 200 MG tablet Take 1 tablet (200 mg total) by mouth 2 (two) times daily. 60 tablet 1  . amLODipine (NORVASC) 5 MG tablet Take 1 tablet (5 mg total) by mouth daily. 30 tablet 1  . aspirin EC 325 MG EC tablet Take 1 tablet (325 mg total) by mouth daily. 30 tablet 0  . Cholecalciferol (VITAMIN D3) 1000 UNITS CAPS Take 1,000 Units by mouth daily.     . clonazePAM (KLONOPIN) 0.5 MG tablet Take 0.25 mg by mouth at bedtime as needed for anxiety.     . furosemide (LASIX) 20 MG tablet Take 1 tablet (20 mg total) by mouth daily for 14 days. 14 tablet 1  . metoprolol tartrate (LOPRESSOR) 50 MG tablet Take 1 tablet (50 mg total) by mouth 2 (two) times daily. 60 tablet 1  . Multiple Vitamins-Minerals (PRESERVISION/LUTEIN) CAPS Take 1 capsule by mouth 2 (two) times daily.     . pantoprazole (PROTONIX) 40 MG tablet Take 40 mg by mouth daily.    . polyethylene glycol powder (GLYCOLAX/MIRALAX) 17 GM/SCOOP powder Take 17 g by mouth 2 (two) times a week. As needed    . potassium chloride (KLOR-CON) 10 MEQ tablet Take 1 tablet (10 mEq total) by mouth daily for 14 days. 14 tablet 1  . rosuvastatin (CRESTOR) 20 MG tablet Take 1 tablet (20 mg total) by mouth at bedtime. 90 tablet 3  . traMADol (ULTRAM) 50 MG tablet Take 1 tablet (50 mg total) by mouth every 6 (six) hours as needed for moderate pain. 30 tablet 0   No current facility-administered medications for this visit.       Physical Exam: BP 127/76   Pulse 69   Temp 97.7 F (36.5 C) (Skin)   Resp 20   Ht 5\' 1"  (1.549 m)   Wt 113 lb (51.3 kg)   SpO2 95% Comment: RA  BMI 21.35 kg/m   General appearance: alert, cooperative and no distress Neurologic: intact Heart: regular rate and rhythm, S1, S2 normal, no murmur, click, rub or  gallop Lungs: clear to auscultation bilaterally Abdomen: soft, non-tender; bowel sounds normal; no masses,  no organomegaly Extremities: Swelling left lower leg left lower leg vein harvest site is healing well without evidence of infection Wound: Sternum is stable and well-healed   Diagnostic Studies & Laboratory data:     Recent Radiology Findings:   US Venous Img Lower Unilateral Left (DVT)  Result Date: 03/16/2020 CLINICAL DATA:  Left ankle pain. EXAM: LEFT LOWER EXTREMITY VENOUS DOPPLER ULTRASOUND  TECHNIQUE: Gray-scale sonography with compression, as well as color and duplex ultrasound, were performed to evaluate the deep venous system(s) from the level of the common femoral vein through the popliteal and proximal calf veins. COMPARISON:  None. FINDINGS: VENOUS Normal compressibility of the common femoral, superficial femoral, and popliteal veins, as well as the visualized calf veins. Visualized portions of profunda femoral vein and great saphenous vein unremarkable. No filling defects to suggest DVT on grayscale or color Doppler imaging. Doppler waveforms show normal direction of venous flow, normal respiratory plasticity and response to augmentation. Limited views of the contralateral common femoral vein are unremarkable. OTHER None. Limitations: none IMPRESSION: Negative. Electronically Signed   By: Constance Holster M.D.   On: 03/16/2020 14:52      Recent Lab Findings: Lab Results  Component Value Date   WBC 7.5 03/07/2020   HGB 12.0 03/07/2020   HCT 37.9 03/07/2020   PLT 234 03/07/2020   GLUCOSE 148 (H) 03/07/2020   CHOL 130 01/29/2020   TRIG 105 01/29/2020   HDL 38 (L) 01/29/2020   LDLCALC 71 01/29/2020   ALT 25 03/05/2020   AST 24 03/05/2020   NA 137 03/07/2020   K 3.9 03/07/2020   CL 102 03/07/2020   CREATININE 0.78 03/07/2020   BUN 16 03/07/2020   CO2 28 03/07/2020   TSH 1.644 Test methodology is 3rd generation TSH 12/20/2007   INR 1.4 (H) 03/02/2020   HGBA1C 6.4  (H) 03/01/2020      Assessment / Plan:   #1 status post coronary artery bypass grafting for unstable angina-patient has some swelling of the left leg likely related to the vein harvest site-duplex shows no evidence of DVT in the left leg.  She was started on Lasix 20 mg once a day yesterday and will continue this for 7 days.  She was told to continue ambulating as she tolerates but to keep her legs up as much as possible when not walking.  We will plan to see her back May 26-she has a cardiology appointment May 11   Medication Changes: No orders of the defined types were placed in this encounter.     Grace Isaac MD      [address] [place] [address] Office [phone]     03/17/2020 2:39 PM

## 2020-03-17 ENCOUNTER — Ambulatory Visit (INDEPENDENT_AMBULATORY_CARE_PROVIDER_SITE_OTHER): Payer: Self-pay | Admitting: Cardiothoracic Surgery

## 2020-03-17 ENCOUNTER — Ambulatory Visit: Payer: Medicare Other | Admitting: Cardiothoracic Surgery

## 2020-03-17 VITALS — BP 127/76 | HR 69 | Temp 97.7°F | Resp 20 | Ht 61.0 in | Wt 113.0 lb

## 2020-03-17 DIAGNOSIS — I25110 Atherosclerotic heart disease of native coronary artery with unstable angina pectoris: Secondary | ICD-10-CM

## 2020-03-17 DIAGNOSIS — Z09 Encounter for follow-up examination after completed treatment for conditions other than malignant neoplasm: Secondary | ICD-10-CM

## 2020-03-17 DIAGNOSIS — Z951 Presence of aortocoronary bypass graft: Secondary | ICD-10-CM

## 2020-03-18 DIAGNOSIS — Z6821 Body mass index (BMI) 21.0-21.9, adult: Secondary | ICD-10-CM | POA: Diagnosis not present

## 2020-03-18 DIAGNOSIS — E1165 Type 2 diabetes mellitus with hyperglycemia: Secondary | ICD-10-CM | POA: Diagnosis not present

## 2020-03-18 DIAGNOSIS — I714 Abdominal aortic aneurysm, without rupture: Secondary | ICD-10-CM | POA: Diagnosis not present

## 2020-03-18 DIAGNOSIS — Z48812 Encounter for surgical aftercare following surgery on the circulatory system: Secondary | ICD-10-CM | POA: Diagnosis not present

## 2020-03-18 DIAGNOSIS — M6281 Muscle weakness (generalized): Secondary | ICD-10-CM | POA: Diagnosis not present

## 2020-03-18 DIAGNOSIS — I6529 Occlusion and stenosis of unspecified carotid artery: Secondary | ICD-10-CM | POA: Diagnosis not present

## 2020-03-18 DIAGNOSIS — I25110 Atherosclerotic heart disease of native coronary artery with unstable angina pectoris: Secondary | ICD-10-CM | POA: Diagnosis not present

## 2020-03-18 DIAGNOSIS — I25119 Atherosclerotic heart disease of native coronary artery with unspecified angina pectoris: Secondary | ICD-10-CM | POA: Diagnosis not present

## 2020-03-18 DIAGNOSIS — I10 Essential (primary) hypertension: Secondary | ICD-10-CM | POA: Diagnosis not present

## 2020-03-18 DIAGNOSIS — E119 Type 2 diabetes mellitus without complications: Secondary | ICD-10-CM | POA: Diagnosis not present

## 2020-03-18 DIAGNOSIS — I70203 Unspecified atherosclerosis of native arteries of extremities, bilateral legs: Secondary | ICD-10-CM | POA: Diagnosis not present

## 2020-03-18 DIAGNOSIS — Z299 Encounter for prophylactic measures, unspecified: Secondary | ICD-10-CM | POA: Diagnosis not present

## 2020-03-21 ENCOUNTER — Ambulatory Visit: Payer: Medicare Other | Admitting: Cardiology

## 2020-03-21 ENCOUNTER — Encounter: Payer: Self-pay | Admitting: Cardiology

## 2020-03-21 NOTE — Progress Notes (Signed)
Cardiology Office Note  Date: 03/22/2020   ID: [name], DOB July 31, 1936, MRN [id number]  PCP:  Glenda Chroman, MD  Cardiologist:  Rozann Lesches, MD Electrophysiologist:  None   Chief Complaint  Patient presents with   Hospitalization Follow-up    History of Present Illness: [name] is an 84 y.o. female presenting for a post hospital follow-up.  She is status post CABG with Dr. Servando Snare on April 21 including LIMA to LAD, SVG to distal RCA, and SVG to first diagonal.  Recent follow-up visit noted with [facility]CTS on May 6, I reviewed the note.  She is here today with her daughter for follow-up.  She states that she has been doing reasonably well, gradually increasing her activity around the house.  She had experience of difficulty with left foot swelling but this has gotten better recently.  She continues on low-dose Lasix with potassium supplement.  She was discharged on amiodarone for management of postoperative atrial fibrillation.  She does not report any palpitations.  We discussed reducing the dose to 200 mg once daily.  I reviewed her remaining medications which are otherwise outlined below.  We discussed the possibility of cardiac rehabilitation as well.  Past Medical History:  Diagnosis Date   Anemia    Anxiety    Arthritis    Chronic constipation    Claudication (HCC)    Chronic occlusion of the right limb of aortic stent graft.   Diverticulosis    Left sided noted on colonoscopy 02/2006   Ectopic atrial tachycardia (HCC)    Esophageal reflux disease    Essential hypertension    Hiatal hernia    History of TIA (transient ischemic attack)    Hyperlipidemia    Lumbar radiculopathy    Macular degeneration    Migraines    Non-obstructive CAD    Status post CABG April 2021 - Dr. Servando Snare   Penetrating atherosclerotic ulcer of aorta ([place])    Complex ulcerated plaque of the infrarenal abdominal aorta mild renal artery stenosis on the right  side, normal left renal artery catheterization 2009, status post aortic stent graft   Renal artery stenosis ([place])    a. Right renal artery stent April 2013 - Dr. Fletcher Anon;  b. 03/2014 PTA/stenting of RRA 2/2 ISR: 6x18 Herculink stent.   Trigeminal neuralgia    Right   Type 2 diabetes mellitus ([place])     Past Surgical History:  Procedure Laterality Date   ABDOMINAL AORTAGRAM N/A 02/13/2012   Procedure: ABDOMINAL Maxcine Ham;  Surgeon: Wellington Hampshire, MD;  Location: [facility] CATH LAB;  Service: Cardiovascular;  Laterality: N/A;   ABDOMINAL AORTAGRAM N/A 03/17/2014   Procedure: ABDOMINAL Maxcine Ham;  Surgeon: Wellington Hampshire, MD;  Location: [facility] CATH LAB;  Service: Cardiovascular;  Laterality: N/A;   ABDOMINAL HYSTERECTOMY  1982   BREAST BIOPSY Left 1990's   CAROTID ENDARTERECTOMY Left 1993   COLONOSCOPY     CORONARY ARTERY BYPASS GRAFT N/A 03/02/2020   Procedure: CORONARY ARTERY BYPASS GRAFTING (CABG)x 3, LIMA TO LAD, SVG TO DIAGONAL, SVG TO RCA, WITH OPEN HARVESTING OF LEFT LOWER GREATER SAPHENOUS VEIN AND RIGHT AND LEFT GREATER SAPHENOUS VEIN HARVESTED ENDOSCOPICALLY;  Surgeon: Grace Isaac, MD;  Location: [facility];  Service: Open Heart Surgery;  Laterality: N/A;   Endologix powerlink bifurcated     Covered stent graft   ENDOVEIN HARVEST OF GREATER SAPHENOUS VEIN Bilateral 03/02/2020   Procedure: [place] Of Greater Saphenous Vein;  Surgeon: Grace Isaac, MD;  Location: [facility] OR;  Service: Open Heart Surgery;  Laterality: Bilateral;   LEFT HEART CATH AND CORONARY ANGIOGRAPHY N/A 01/28/2020   Procedure: LEFT HEART CATH AND CORONARY ANGIOGRAPHY;  Surgeon: Nelva Bush, MD;  Location: [facility] CV LAB;  Service: Cardiovascular;  Laterality: N/A;   LEFT HEART CATHETERIZATION WITH CORONARY ANGIOGRAM N/A 03/17/2014   Procedure: LEFT HEART CATHETERIZATION WITH CORONARY ANGIOGRAM;  Surgeon: Wellington Hampshire, MD;  Location: [facility] CATH LAB;  Service: Cardiovascular;  Laterality: N/A;    LUMBAR DISC SURGERY  1981   PERCUTANEOUS STENT INTERVENTION Right 03/17/2014   Procedure: PERCUTANEOUS STENT INTERVENTION;  Surgeon: Wellington Hampshire, MD;  Location: [facility] CATH LAB;  Service: Cardiovascular;  Laterality: Right;  Renal   RENAL ANGIOGRAM N/A 02/13/2012   Procedure: RENAL ANGIOGRAM;  Surgeon: Wellington Hampshire, MD;  Location: [facility] CATH LAB;  Service: Cardiovascular;  Laterality: N/A;   RENAL ANGIOGRAM N/A 03/17/2014   Procedure: RENAL ANGIOGRAM;  Surgeon: Wellington Hampshire, MD;  Location: [facility] CATH LAB;  Service: Cardiovascular;  Laterality: N/A;   TEE WITHOUT CARDIOVERSION N/A 03/02/2020   Procedure: TRANSESOPHAGEAL ECHOCARDIOGRAM (TEE);  Surgeon: Grace Isaac, MD;  Location: [facility];  Service: Open Heart Surgery;  Laterality: N/A;   TONSILLECTOMY AND ADENOIDECTOMY  1947    Current Outpatient Medications  Medication Sig Dispense Refill   acetaminophen (TYLENOL) 500 MG tablet Take 500 mg by mouth every 6 (six) hours as needed.     amiodarone (PACERONE) 200 MG tablet Take 1 tablet (200 mg total) by mouth daily. 30 tablet 2   amLODipine (NORVASC) 5 MG tablet Take 1 tablet (5 mg total) by mouth daily. 30 tablet 1   aspirin EC 325 MG EC tablet Take 1 tablet (325 mg total) by mouth daily. 30 tablet 0   Cholecalciferol (VITAMIN D3) 1000 UNITS CAPS Take 1,000 Units by mouth daily.      clonazePAM (KLONOPIN) 0.5 MG tablet Take 0.25 mg by mouth 2 (two) times daily as needed for anxiety.      furosemide (LASIX) 20 MG tablet Take 1 tablet (20 mg total) by mouth daily for 14 days. 14 tablet 1   metoprolol tartrate (LOPRESSOR) 50 MG tablet Take 1 tablet (50 mg total) by mouth 2 (two) times daily. 60 tablet 1   Multiple Vitamins-Minerals (PRESERVISION/LUTEIN) CAPS Take 1 capsule by mouth 2 (two) times daily.      pantoprazole (PROTONIX) 40 MG tablet Take 40 mg by mouth daily.     polyethylene glycol powder (GLYCOLAX/MIRALAX) 17 GM/SCOOP powder Take 17 g by mouth 2 (two) times a week. As  needed     potassium chloride (KLOR-CON) 10 MEQ tablet Take 1 tablet (10 mEq total) by mouth daily for 14 days. 14 tablet 1   rosuvastatin (CRESTOR) 20 MG tablet Take 1 tablet (20 mg total) by mouth at bedtime. 90 tablet 3   traMADol (ULTRAM) 50 MG tablet Take 1 tablet (50 mg total) by mouth every 6 (six) hours as needed for moderate pain. 30 tablet 0   No current facility-administered medications for this visit.   Allergies:  Cefixime, Conjugated estrogens, Morphine and related, Codeine, Iohexol, and Adenosine   ROS:   Improving appetite.  Physical Exam: VS:  BP 136/64    Pulse 60    Ht 5' 1.5" (1.562 m)    Wt 114 lb 9.6 oz (52 kg)    SpO2 97%    BMI 21.30 kg/m , BMI Body mass index is 21.3 kg/m.  Wt Readings  from Last 3 Encounters:  03/22/20 114 lb 9.6 oz (52 kg)  03/17/20 113 lb (51.3 kg)  03/09/20 112 lb 7 oz (51 kg)    General:  Elderly woman, appears comfortable at rest. HEENT: Conjunctiva and lids normal, wearing a mask. Neck: Supple, no elevated JVP or carotid bruits. Thorax: Well-healing sternal incision. Lungs: Clear to auscultation, nonlabored breathing at rest. Cardiac: Regular rate and rhythm, no S3, soft systolic murmur, no pericardial rub. Extremities: Healing lower leg vein harvest site on the left with mild ankle edema. Skin: Warm and dry. Musculoskeletal: No kyphosis. Neuropsychiatric: Alert and oriented x3, affect grossly appropriate.  ECG:  An ECG dated 03/03/2020 was personally reviewed today and demonstrated:  Sinus rhythm with nonspecific T wave changes.  Recent Labwork: 03/03/2020: Magnesium 2.5 03/05/2020: ALT 25; AST 24 03/07/2020: BUN 16; Creatinine, Ser 0.78; Hemoglobin 12.0; Platelets 234; Potassium 3.9; Sodium 137     Component Value Date/Time   CHOL 130 01/29/2020 0202   TRIG 105 01/29/2020 0202   HDL 38 (L) 01/29/2020 0202   CHOLHDL 3.4 01/29/2020 0202   VLDL 21 01/29/2020 0202   LDLCALC 71 01/29/2020 0202    Other Studies Reviewed  Today:  Intraoperative TEE 0000000: Complications: No known complications during this procedure.  POST-OP IMPRESSIONS  - Left Ventricle: The left ventricle is unchanged from pre-bypass.  - Right Ventricle: The right ventricle appears unchanged from pre-bypass.  - Aorta: The aorta appears unchanged from pre-bypass.  - Left Atrium: The left atrium appears unchanged from pre-bypass.  - Left Atrial Appendage: The left atrial appendage appears unchanged from  pre-bypass.  - Aortic Valve: The aortic valve appears unchanged from pre-bypass.  - Mitral Valve: The mitral valve appears unchanged from pre-bypass.  - Tricuspid Valve: The tricuspid valve appears unchanged from pre-bypass.  - Interatrial Septum: The interatrial septum appears unchanged from  pre-bypass.  - Interventricular Septum: The interventricular septum appears unchanged  from  pre-bypass.  - Pericardium: The pericardium appears unchanged from pre-bypass.   PRE-OP FINDINGS  Left Ventricle: The left ventricle has normal systolic function, with an  ejection fraction of 55-60%. The cavity size was normal. There is mild  concentric left ventricular hypertrophy.   Right Ventricle: The right ventricle has normal systolic function. The  cavity was normal. There is no increase in right ventricular wall  thickness.   Left Atrium: Left atrial size was normal in size.   Right Atrium: Right atrial size was normal in size.   Interatrial Septum: No atrial level shunt detected by color flow Doppler.   Pericardium: There is no evidence of pericardial effusion.   Mitral Valve: The mitral valve is normal in structure. Mild thickening of  the mitral valve leaflet. Mitral valve regurgitation is mild by color flow  Doppler. The MR jet is centrally-directed.   Tricuspid Valve: The tricuspid valve was normal in structure. Tricuspid  valve regurgitation is trivial by color flow Doppler. The tricuspid valve  is mildly thickened.    Aortic Valve: The aortic valve is tricuspid Aortic valve regurgitation was  not visualized by color flow Doppler. There is no stenosis of the aortic  valve.   Pulmonic Valve: The pulmonic valve was normal in structure.  Pulmonic valve regurgitation is trivial by color flow Doppler.   Aorta: There is evidence of plaque in the descending aorta; Grade III,  measuring 3-40mm in size.  Assessment and Plan:  1.  Multivessel CAD status post CABG on April 21 by Dr. Servando Snare, patient is recuperating  well.  Gradually increasing her activity, has not yet decided about cardiac rehabilitation.  Continue aspirin, Lopressor, Norvasc, and Crestor.  She will complete low-dose Lasix with potassium supplement soon.  Follow-up with Dr. Servando Snare later this month with chest x-ray as scheduled.  2.  Postoperative atrial fibrillation.  Reduce amiodarone to 200 mg once daily.  Most likely will plan to discontinue in 6 weeks if no recurring arrhythmia.  3.  Essential hypertension, blood pressure is reasonably well controlled today.  Continue Norvasc and Lopressor for now.  Medication Adjustments/Labs and Tests Ordered: Current medicines are reviewed at length with the patient today.  Concerns regarding medicines are outlined above.   Tests Ordered: No orders of the defined types were placed in this encounter.   Medication Changes: Meds ordered this encounter  Medications   amiodarone (PACERONE) 200 MG tablet    Sig: Take 1 tablet (200 mg total) by mouth daily.    Dispense:  30 tablet    Refill:  2    03/22/2020 dose decrease    Disposition:  Follow up 6 weeks in the [place] office.  Signed, [name], MD, [facility] 03/22/2020 12:09 PM    [facility] [place], [place], [place] [address] Phone: [phone]; Fax: [phone]

## 2020-03-22 ENCOUNTER — Encounter: Payer: Self-pay | Admitting: Cardiology

## 2020-03-22 ENCOUNTER — Other Ambulatory Visit: Payer: Self-pay

## 2020-03-22 ENCOUNTER — Ambulatory Visit (INDEPENDENT_AMBULATORY_CARE_PROVIDER_SITE_OTHER): Payer: Medicare Other | Admitting: Cardiology

## 2020-03-22 VITALS — BP 136/64 | HR 60 | Ht 61.5 in | Wt 114.6 lb

## 2020-03-22 DIAGNOSIS — I9789 Other postprocedural complications and disorders of the circulatory system, not elsewhere classified: Secondary | ICD-10-CM | POA: Diagnosis not present

## 2020-03-22 DIAGNOSIS — I25119 Atherosclerotic heart disease of native coronary artery with unspecified angina pectoris: Secondary | ICD-10-CM | POA: Diagnosis not present

## 2020-03-22 DIAGNOSIS — I200 Unstable angina: Secondary | ICD-10-CM

## 2020-03-22 DIAGNOSIS — I4891 Unspecified atrial fibrillation: Secondary | ICD-10-CM

## 2020-03-22 DIAGNOSIS — I10 Essential (primary) hypertension: Secondary | ICD-10-CM | POA: Diagnosis not present

## 2020-03-22 MED ORDER — AMIODARONE HCL 200 MG PO TABS
200.0000 mg | ORAL_TABLET | Freq: Every day | ORAL | 2 refills | Status: DC
Start: 2020-03-22 — End: 2020-04-13

## 2020-03-22 NOTE — Patient Instructions (Addendum)
Medication Instructions:    Your physician has recommended you make the following change in your medication:   Decrease amiodarone 200 mg to daily  Continue other medications the same  Labwork:   NONE  Testing/Procedures:  NONE  Follow-Up:  Your physician recommends that you schedule a follow-up appointment in: 6 weeks (office).  Any Other Special Instructions Will Be Listed Below (If Applicable).  If you need a refill on your cardiac medications before your next appointment, please call your pharmacy.

## 2020-03-23 DIAGNOSIS — I70203 Unspecified atherosclerosis of native arteries of extremities, bilateral legs: Secondary | ICD-10-CM | POA: Diagnosis not present

## 2020-03-23 DIAGNOSIS — Z48812 Encounter for surgical aftercare following surgery on the circulatory system: Secondary | ICD-10-CM | POA: Diagnosis not present

## 2020-03-23 DIAGNOSIS — I25110 Atherosclerotic heart disease of native coronary artery with unstable angina pectoris: Secondary | ICD-10-CM | POA: Diagnosis not present

## 2020-03-23 DIAGNOSIS — M6281 Muscle weakness (generalized): Secondary | ICD-10-CM | POA: Diagnosis not present

## 2020-03-23 DIAGNOSIS — I714 Abdominal aortic aneurysm, without rupture: Secondary | ICD-10-CM | POA: Diagnosis not present

## 2020-03-23 DIAGNOSIS — E119 Type 2 diabetes mellitus without complications: Secondary | ICD-10-CM | POA: Diagnosis not present

## 2020-03-25 DIAGNOSIS — M6281 Muscle weakness (generalized): Secondary | ICD-10-CM | POA: Diagnosis not present

## 2020-03-25 DIAGNOSIS — E119 Type 2 diabetes mellitus without complications: Secondary | ICD-10-CM | POA: Diagnosis not present

## 2020-03-25 DIAGNOSIS — I714 Abdominal aortic aneurysm, without rupture: Secondary | ICD-10-CM | POA: Diagnosis not present

## 2020-03-25 DIAGNOSIS — Z48812 Encounter for surgical aftercare following surgery on the circulatory system: Secondary | ICD-10-CM | POA: Diagnosis not present

## 2020-03-25 DIAGNOSIS — I70203 Unspecified atherosclerosis of native arteries of extremities, bilateral legs: Secondary | ICD-10-CM | POA: Diagnosis not present

## 2020-03-25 DIAGNOSIS — I25110 Atherosclerotic heart disease of native coronary artery with unstable angina pectoris: Secondary | ICD-10-CM | POA: Diagnosis not present

## 2020-03-28 DIAGNOSIS — M6281 Muscle weakness (generalized): Secondary | ICD-10-CM | POA: Diagnosis not present

## 2020-03-28 DIAGNOSIS — Z48812 Encounter for surgical aftercare following surgery on the circulatory system: Secondary | ICD-10-CM | POA: Diagnosis not present

## 2020-03-28 DIAGNOSIS — E119 Type 2 diabetes mellitus without complications: Secondary | ICD-10-CM | POA: Diagnosis not present

## 2020-03-28 DIAGNOSIS — I714 Abdominal aortic aneurysm, without rupture: Secondary | ICD-10-CM | POA: Diagnosis not present

## 2020-03-28 DIAGNOSIS — I25110 Atherosclerotic heart disease of native coronary artery with unstable angina pectoris: Secondary | ICD-10-CM | POA: Diagnosis not present

## 2020-03-28 DIAGNOSIS — I70203 Unspecified atherosclerosis of native arteries of extremities, bilateral legs: Secondary | ICD-10-CM | POA: Diagnosis not present

## 2020-03-30 DIAGNOSIS — M6281 Muscle weakness (generalized): Secondary | ICD-10-CM | POA: Diagnosis not present

## 2020-03-30 DIAGNOSIS — I70203 Unspecified atherosclerosis of native arteries of extremities, bilateral legs: Secondary | ICD-10-CM | POA: Diagnosis not present

## 2020-03-30 DIAGNOSIS — Z48812 Encounter for surgical aftercare following surgery on the circulatory system: Secondary | ICD-10-CM | POA: Diagnosis not present

## 2020-03-30 DIAGNOSIS — E119 Type 2 diabetes mellitus without complications: Secondary | ICD-10-CM | POA: Diagnosis not present

## 2020-03-30 DIAGNOSIS — I714 Abdominal aortic aneurysm, without rupture: Secondary | ICD-10-CM | POA: Diagnosis not present

## 2020-03-30 DIAGNOSIS — I25110 Atherosclerotic heart disease of native coronary artery with unstable angina pectoris: Secondary | ICD-10-CM | POA: Diagnosis not present

## 2020-04-05 ENCOUNTER — Other Ambulatory Visit: Payer: Self-pay | Admitting: Cardiothoracic Surgery

## 2020-04-05 DIAGNOSIS — Z951 Presence of aortocoronary bypass graft: Secondary | ICD-10-CM

## 2020-04-06 NOTE — Progress Notes (Signed)
[address]       [place] [address]             [phone]      [name] Medical Record [id number] Date of Birth: [birth year]  Referring: [name], MD Primary Care: Glenda Chroman, MD Primary Cardiologist: Rozann Lesches, MD   Chief Complaint:   POST OP FOLLOW UP OPERATIVE REPORT DATE OF PROCEDURE:  03/02/2020 PREOPERATIVE DIAGNOSES:  Unstable angina with severe 2-vessel coronary artery disease. POSTOPERATIVE DIAGNOSES:  Unstable angina with severe 2-vessel coronary artery disease. SURGICAL PROCEDURE:  Coronary artery bypass grafting x3 with the left internal mammary artery to the left anterior descending coronary artery, reverse saphenous vein graft to the distal right coronary artery, reverse saphenous vein graft to the first  diagonal coronary artery with endoscopic vein harvesting, right and left greater saphenous veins, thigh open vein harvesting, left greater saphenous vein lower leg.  History of Present Illness:     Patient returns to the office today in follow-up after recent coronary artery bypass grafting on April 21.  The lower extremity edema especially on the left that she had noted on previous visit has significantly improved.  She notes that overall her strength is improving.  She declined going to cardiac rehab, but has been increasing her overall physical activity each day.   Past Medical History:  Diagnosis Date  . Anemia   . Anxiety   . Arthritis   . Chronic constipation   . Claudication [facility])    Chronic occlusion of the right limb of aortic stent graft.  . Diverticulosis    Left sided noted on colonoscopy 02/2006  . Ectopic atrial tachycardia ([place])   . Esophageal reflux disease   . Essential hypertension   . Hiatal hernia   . History of TIA (transient ischemic attack)   . Hyperlipidemia   . Lumbar radiculopathy   . Macular degeneration   . Migraines   . Non-obstructive CAD    Status post CABG April  2021 - Dr. Servando Snare  . Penetrating atherosclerotic ulcer of aorta (HCC)    Complex ulcerated plaque of the infrarenal abdominal aorta mild renal artery stenosis on the right side, normal left renal artery catheterization 2009, status post aortic stent graft  . Renal artery stenosis ([place])    a. Right renal artery stent April 2013 - Dr. Fletcher Anon;  b. 03/2014 PTA/stenting of RRA 2/2 ISR: 6x18 Herculink stent.  . Trigeminal neuralgia    Right  . Type 2 diabetes mellitus (HCC)      Social History   Tobacco Use  Smoking Status Former Smoker  . Packs/day: 0.20  . Years: 1.00  . Pack years: 0.20  . Types: Cigarettes  . Quit date: 11/13/1979  . Years since quitting: 40.4  Smokeless Tobacco Never Used  Tobacco Comment   "smoked a few months"    Social History   Substance and Sexual Activity  Alcohol Use No  . Alcohol/week: 0.0 standard drinks     Allergies  Allergen Reactions  . Cefixime Shortness Of Breath    Patient is not familiar with this listed allergy  . Conjugated Estrogens Other (See Comments)    REACTION: flushing  . Morphine And Related Other (See Comments)    Morphine injection Neck pain  . Codeine Other (See Comments)    REACTION: dizziness  . Iohexol      Desc: pt has anxiety and an irregular heartbeat. contrast exacerbates this.  pt was very uncomfortable after a 20cc bolus for an miroi. we did w/o iv 11/09 per dr. Kris Hartmann. pt will talk w/ md about this and will probably not get iv contrast in the future at her re, Onset Date: [id number]   . Adenosine Other (See Comments)    Elevated heart rate when doing stress test    Current Outpatient Medications  Medication Sig Dispense Refill  . acetaminophen (TYLENOL) 500 MG tablet Take 500 mg by mouth every 6 (six) hours as needed.    Marland Kitchen amiodarone (PACERONE) 200 MG tablet Take 1 tablet (200 mg total) by mouth daily. 30 tablet 2  . amLODipine (NORVASC) 5 MG tablet Take 1 tablet (5 mg total) by mouth daily. 30 tablet 1  .  aspirin EC 325 MG EC tablet Take 1 tablet (325 mg total) by mouth daily. 30 tablet 0  . Cholecalciferol (VITAMIN D3) 1000 UNITS CAPS Take 1,000 Units by mouth daily.     . clonazePAM (KLONOPIN) 0.5 MG tablet Take 0.25 mg by mouth 2 (two) times daily as needed for anxiety.     . metoprolol tartrate (LOPRESSOR) 50 MG tablet Take 1 tablet (50 mg total) by mouth 2 (two) times daily. 60 tablet 1  . Multiple Vitamins-Minerals (PRESERVISION/LUTEIN) CAPS Take 1 capsule by mouth 2 (two) times daily.     . pantoprazole (PROTONIX) 40 MG tablet Take 40 mg by mouth daily.    . polyethylene glycol powder (GLYCOLAX/MIRALAX) 17 GM/SCOOP powder Take 17 g by mouth 2 (two) times a week. As needed    . rosuvastatin (CRESTOR) 20 MG tablet Take 1 tablet (20 mg total) by mouth at bedtime. 90 tablet 3   No current facility-administered medications for this visit.       Physical Exam: BP (!) 150/74   Pulse 70   Temp 97.7 F (36.5 C) (Skin)   Resp 20   Ht 5' 1.5" (1.562 m)   Wt 114 lb (51.7 kg)   SpO2 97% Comment: RA  BMI 21.19 kg/m   General appearance: alert, cooperative and no distress Neurologic: intact Heart: regular rate and rhythm, S1, S2 normal, no murmur, click, rub or gallop Lungs: clear to auscultation bilaterally Abdomen: soft, non-tender; bowel sounds normal; no masses,  no organomegaly Extremities: Swelling left lower leg left lower leg vein harvest site is healing well without evidence of infection Wound: Sternum is stable and well-healed   Diagnostic Studies & Laboratory data:     Recent Radiology Findings:   DG Chest 2 View  Result Date: 04/07/2020 CLINICAL DATA:  Status post CABG. EXAM: CHEST - 2 VIEW COMPARISON:  03/05/2020 FINDINGS: The lungs are clear without focal pneumonia, edema, pneumothorax or pleural effusion. Atelectasis or scarring noted both lung bases. Hyperexpansion suggests emphysema. The cardiopericardial silhouette is within normal limits for size. The visualized  bony structures of the thorax are intact. IMPRESSION: Bibasilar atelectasis or scarring. No acute cardiopulmonary findings. Electronically Signed   By: Misty Stanley M.D.   On: 04/07/2020 10:44    I have independently reviewed the above radiology studies  and reviewed the findings with the patient.    Recent Lab Findings: Lab Results  Component Value Date   WBC 7.5 03/07/2020   HGB 12.0 03/07/2020   HCT 37.9 03/07/2020   PLT 234 03/07/2020   GLUCOSE 148 (H) 03/07/2020   CHOL 130 01/29/2020   TRIG 105 01/29/2020   HDL 38 (L) 01/29/2020   LDLCALC 71 01/29/2020   ALT 25 03/05/2020  AST 24 03/05/2020   NA 137 03/07/2020   K 3.9 03/07/2020   CL 102 03/07/2020   CREATININE 0.78 03/07/2020   BUN 16 03/07/2020   CO2 28 03/07/2020   TSH 1.644 Test methodology is 3rd generation TSH 12/20/2007   INR 1.4 (H) 03/02/2020   HGBA1C 6.4 (H) 03/01/2020      Assessment / Plan:   #1 status post coronary artery bypass grafting for unstable angina-patient making good progress postoperatively.  Wounds are all healing well, lower extremity edema has improved #2 postop atrial fib-peers to remaining in sinus rhythm, amiodarone dose decreased to 200 mg a day by cardiology  Plan to see the patient back in 1 month    Medication Changes: No orders of the defined types were placed in this encounter.     [name] Isaac MD      [address] [place] [address] Office [phone]     04/07/2020 11:16 AM

## 2020-04-07 ENCOUNTER — Other Ambulatory Visit: Payer: Self-pay

## 2020-04-07 ENCOUNTER — Ambulatory Visit
Admission: RE | Admit: 2020-04-07 | Discharge: 2020-04-07 | Disposition: A | Discharge status: Home / Self Care | Payer: Medicare Other | Source: Ambulatory Visit | Attending: Cardiothoracic Surgery | Admitting: Cardiothoracic Surgery

## 2020-04-07 ENCOUNTER — Ambulatory Visit (INDEPENDENT_AMBULATORY_CARE_PROVIDER_SITE_OTHER): Payer: Self-pay | Admitting: Cardiothoracic Surgery

## 2020-04-07 VITALS — BP 150/74 | HR 70 | Temp 97.7°F | Resp 20 | Ht 61.5 in | Wt 114.0 lb

## 2020-04-07 DIAGNOSIS — I25110 Atherosclerotic heart disease of native coronary artery with unstable angina pectoris: Secondary | ICD-10-CM

## 2020-04-07 DIAGNOSIS — Z951 Presence of aortocoronary bypass graft: Secondary | ICD-10-CM

## 2020-04-08 DIAGNOSIS — I70203 Unspecified atherosclerosis of native arteries of extremities, bilateral legs: Secondary | ICD-10-CM | POA: Diagnosis not present

## 2020-04-08 DIAGNOSIS — I714 Abdominal aortic aneurysm, without rupture: Secondary | ICD-10-CM | POA: Diagnosis not present

## 2020-04-08 DIAGNOSIS — M6281 Muscle weakness (generalized): Secondary | ICD-10-CM | POA: Diagnosis not present

## 2020-04-08 DIAGNOSIS — I25110 Atherosclerotic heart disease of native coronary artery with unstable angina pectoris: Secondary | ICD-10-CM | POA: Diagnosis not present

## 2020-04-08 DIAGNOSIS — Z48812 Encounter for surgical aftercare following surgery on the circulatory system: Secondary | ICD-10-CM | POA: Diagnosis not present

## 2020-04-08 DIAGNOSIS — E119 Type 2 diabetes mellitus without complications: Secondary | ICD-10-CM | POA: Diagnosis not present

## 2020-04-09 DIAGNOSIS — Z48812 Encounter for surgical aftercare following surgery on the circulatory system: Secondary | ICD-10-CM | POA: Diagnosis not present

## 2020-04-10 DIAGNOSIS — I10 Essential (primary) hypertension: Secondary | ICD-10-CM | POA: Diagnosis not present

## 2020-04-13 ENCOUNTER — Ambulatory Visit (INDEPENDENT_AMBULATORY_CARE_PROVIDER_SITE_OTHER): Payer: Medicare Other | Admitting: Cardiology

## 2020-04-13 ENCOUNTER — Encounter: Payer: Self-pay | Admitting: Cardiology

## 2020-04-13 ENCOUNTER — Other Ambulatory Visit: Payer: Self-pay

## 2020-04-13 VITALS — BP 142/78 | HR 65 | Ht 61.5 in | Wt 113.6 lb

## 2020-04-13 DIAGNOSIS — I200 Unstable angina: Secondary | ICD-10-CM

## 2020-04-13 DIAGNOSIS — I4891 Unspecified atrial fibrillation: Secondary | ICD-10-CM | POA: Diagnosis not present

## 2020-04-13 DIAGNOSIS — I9789 Other postprocedural complications and disorders of the circulatory system, not elsewhere classified: Secondary | ICD-10-CM

## 2020-04-13 DIAGNOSIS — I25119 Atherosclerotic heart disease of native coronary artery with unspecified angina pectoris: Secondary | ICD-10-CM

## 2020-04-13 DIAGNOSIS — E782 Mixed hyperlipidemia: Secondary | ICD-10-CM

## 2020-04-13 NOTE — Progress Notes (Signed)
Cardiology Office Note  Date: 04/13/2020   ID: [name], DOB [birth year], MRN [id number]  PCP:  Glenda Chroman, MD  Cardiologist:  Rozann Lesches, MD Electrophysiologist:  None   Chief Complaint  Patient presents with  . Cardiac follow-up    History of Present Illness: [name] is an 84 y.o. female last seen in May.  She presents for a follow-up visit.  Continues to recuperate, generally doing well and gradually increasing her activity.  She has returned to driving.  She is status post CABG with Dr. Servando Snare on April 21 including LIMA to LAD, SVG to distal RCA, and SVG to first diagonal.  We went over her medications.  She has been on amiodarone since late April, no obvious recurrent atrial fibrillation.  She will stop this medication after completing the current bottle.  Past Medical History:  Diagnosis Date  . Anemia   . Anxiety   . Arthritis   . Chronic constipation   . Claudication [facility])    Chronic occlusion of the right limb of aortic stent graft.  . Diverticulosis    Left sided noted on colonoscopy 02/2006  . Ectopic atrial tachycardia ([place])   . Esophageal reflux disease   . Essential hypertension   . Hiatal hernia   . History of TIA (transient ischemic attack)   . Hyperlipidemia   . Lumbar radiculopathy   . Macular degeneration   . Migraines   . Non-obstructive CAD    Status post CABG April 2021 - Dr. Servando Snare  . Penetrating atherosclerotic ulcer of aorta (HCC)    Complex ulcerated plaque of the infrarenal abdominal aorta mild renal artery stenosis on the right side, normal left renal artery catheterization 2009, status post aortic stent graft  . Renal artery stenosis ([place])    a. Right renal artery stent April 2013 - Dr. Fletcher Anon;  b. 03/2014 PTA/stenting of RRA 2/2 ISR: 6x18 Herculink stent.  . Trigeminal neuralgia    Right  . Type 2 diabetes mellitus ([place])     Past Surgical History:  Procedure Laterality Date  . ABDOMINAL AORTAGRAM N/A 02/13/2012     Procedure: ABDOMINAL Maxcine Ham;  Surgeon: Wellington Hampshire, MD;  Location: [place] CATH LAB;  Service: Cardiovascular;  Laterality: N/A;  . ABDOMINAL AORTAGRAM N/A 03/17/2014   Procedure: ABDOMINAL Maxcine Ham;  Surgeon: Wellington Hampshire, MD;  Location: [place] CATH LAB;  Service: Cardiovascular;  Laterality: N/A;  . ABDOMINAL HYSTERECTOMY  1982  . BREAST BIOPSY Left 1990's  . CAROTID ENDARTERECTOMY Left 1993  . COLONOSCOPY    . CORONARY ARTERY BYPASS GRAFT N/A 03/02/2020   Procedure: CORONARY ARTERY BYPASS GRAFTING (CABG)x 3, LIMA TO LAD, SVG TO DIAGONAL, SVG TO RCA, WITH OPEN HARVESTING OF LEFT LOWER GREATER SAPHENOUS VEIN AND RIGHT AND LEFT GREATER SAPHENOUS VEIN HARVESTED ENDOSCOPICALLY;  Surgeon: Grace Isaac, MD;  Location: [facility];  Service: Open Heart Surgery;  Laterality: N/A;  . Endologix powerlink bifurcated     Covered stent graft  . ENDOVEIN HARVEST OF GREATER SAPHENOUS VEIN Bilateral 03/02/2020   Procedure: [place] Of Greater Saphenous Vein;  Surgeon: Grace Isaac, MD;  Location: [place];  Service: Open Heart Surgery;  Laterality: Bilateral;  . LEFT HEART CATH AND CORONARY ANGIOGRAPHY N/A 01/28/2020   Procedure: LEFT HEART CATH AND CORONARY ANGIOGRAPHY;  Surgeon: Nelva Bush, MD;  Location: [facility] CV LAB;  Service: Cardiovascular;  Laterality: N/A;  . LEFT HEART CATHETERIZATION WITH CORONARY ANGIOGRAM N/A 03/17/2014   Procedure: LEFT  HEART CATHETERIZATION WITH CORONARY ANGIOGRAM;  Surgeon: Wellington Hampshire, MD;  Location: [facility] CATH LAB;  Service: Cardiovascular;  Laterality: N/A;  . LUMBAR [place]  . PERCUTANEOUS STENT INTERVENTION Right 03/17/2014   Procedure: PERCUTANEOUS STENT INTERVENTION;  Surgeon: Wellington Hampshire, MD;  Location: [place] CATH LAB;  Service: Cardiovascular;  Laterality: Right;  Renal  . RENAL ANGIOGRAM N/A 02/13/2012   Procedure: RENAL ANGIOGRAM;  Surgeon: Wellington Hampshire, MD;  Location: [facility] CATH LAB;  Service: Cardiovascular;  Laterality: N/A;  .  RENAL ANGIOGRAM N/A 03/17/2014   Procedure: RENAL ANGIOGRAM;  Surgeon: Wellington Hampshire, MD;  Location: [place] CATH LAB;  Service: Cardiovascular;  Laterality: N/A;  . TEE WITHOUT CARDIOVERSION N/A 03/02/2020   Procedure: TRANSESOPHAGEAL ECHOCARDIOGRAM (TEE);  Surgeon: Grace Isaac, MD;  Location: [place];  Service: Open Heart Surgery;  Laterality: N/A;  . TONSILLECTOMY AND ADENOIDECTOMY  1947    Current Outpatient Medications  Medication Sig Dispense Refill  . acetaminophen (TYLENOL) 500 MG tablet Take 500 mg by mouth every 6 (six) hours as needed.    Marland Kitchen amLODipine (NORVASC) 5 MG tablet Take 1 tablet (5 mg total) by mouth daily. 30 tablet 1  . aspirin EC 325 MG EC tablet Take 1 tablet (325 mg total) by mouth daily. 30 tablet 0  . Cholecalciferol (VITAMIN D3) 1000 UNITS CAPS Take 1,000 Units by mouth daily.     . clonazePAM (KLONOPIN) 0.5 MG tablet Take 0.25 mg by mouth 2 (two) times daily as needed for anxiety.     . metoprolol tartrate (LOPRESSOR) 50 MG tablet Take 1 tablet (50 mg total) by mouth 2 (two) times daily. 60 tablet 1  . Multiple Vitamins-Minerals (PRESERVISION/LUTEIN) CAPS Take 1 capsule by mouth 2 (two) times daily.     . pantoprazole (PROTONIX) 40 MG tablet Take 40 mg by mouth daily.    . polyethylene glycol powder (GLYCOLAX/MIRALAX) 17 GM/SCOOP powder Take 17 g by mouth 2 (two) times a week. As needed    . rosuvastatin (CRESTOR) 20 MG tablet Take 1 tablet (20 mg total) by mouth at bedtime. 90 tablet 3   No current facility-administered medications for this visit.   Allergies:  Cefixime, Conjugated estrogens, Morphine and related, Codeine, Iohexol, and Adenosine   ROS:   No palpitations or dizziness.  Physical Exam: VS:  BP (!) 142/78   Pulse 65   Ht 5' 1.5" (1.562 m)   Wt 113 lb 9.6 oz (51.5 kg)   SpO2 98%   BMI 21.12 kg/m , BMI Body mass index is 21.12 kg/m.  Wt Readings from Last 3 Encounters:  04/13/20 113 lb 9.6 oz (51.5 kg)  04/07/20 114 lb (51.7 kg)  03/22/20  114 lb 9.6 oz (52 kg)    General:  Elderly woman, appears comfortable at rest. HEENT: Conjunctiva and lids normal, wearing a mask. Neck: Supple, no elevated JVP or carotid bruits, no thyromegaly. Lungs: Clear to auscultation, nonlabored breathing at rest. Cardiac: Regular rate and rhythm, no S3, soft systolic murmur, no pericardial rub. Extremities: Healing left leg vein harvest site, distal pulses 2+.  ECG:  An ECG dated 03/03/2020 was personally reviewed today and demonstrated:  Sinus rhythm with nonspecific T wave changes.  Recent Labwork: 03/03/2020: Magnesium 2.5 03/05/2020: ALT 25; AST 24 03/07/2020: BUN 16; Creatinine, Ser 0.78; Hemoglobin 12.0; Platelets 234; Potassium 3.9; Sodium 137     Component Value Date/Time   CHOL 130 01/29/2020 0202   TRIG 105 01/29/2020 0202   HDL  38 (L) 01/29/2020 0202   CHOLHDL 3.4 01/29/2020 0202   VLDL 21 01/29/2020 0202   LDLCALC 71 01/29/2020 0202    Other Studies Reviewed Today:  Lower extremity venous Doppler 03/16/2020: FINDINGS: VENOUS  Normal compressibility of the common femoral, superficial femoral, and popliteal veins, as well as the visualized calf veins. Visualized portions of profunda femoral vein and great saphenous vein unremarkable. No filling defects to suggest DVT on grayscale or color Doppler imaging. Doppler waveforms show normal direction of venous flow, normal respiratory plasticity and response to augmentation.  Limited views of the contralateral common femoral vein are unremarkable.  OTHER  None.  Limitations: none  IMPRESSION: Negative.  Assessment and Plan:  1.  Multivessel CAD status post CABG in April as outlined above.  She continues to recuperate, overall doing well.  Continue aspirin, Norvasc, Lopressor, and Crestor.  2.  Postoperative atrial fibrillation.  She has had no obvious recurrences.  Amiodarone will be discontinued after she completes the present bottle.  3.  Essential hypertension,  continue with current regimen and titrate as needed.  Medication Adjustments/Labs and Tests Ordered: Current medicines are reviewed at length with the patient today.  Concerns regarding medicines are outlined above.   Tests Ordered: No orders of the defined types were placed in this encounter.   Medication Changes: No orders of the defined types were placed in this encounter.   Disposition:  Follow up 3 months in the [place] office.  Signed, [name], MD, [facility] 04/13/2020 1:15 PM    [place] [facility] [place], [place],  [address] Phone: [phone]; Fax: [phone]

## 2020-04-13 NOTE — Patient Instructions (Addendum)
Medication Instructions:   Your physician has recommended you make the following change in your medication:   Stop amiodarone  Continue other medications the same  Labwork:  NONE  Testing/Procedures:  NONE  Follow-Up:  Your physician recommends that you schedule a follow-up appointment in: 3 months (office).  Any Other Special Instructions Will Be Listed Below (If Applicable).  If you need a refill on your cardiac medications before your next appointment, please call your pharmacy.

## 2020-04-26 DIAGNOSIS — I471 Supraventricular tachycardia: Secondary | ICD-10-CM | POA: Diagnosis not present

## 2020-04-26 DIAGNOSIS — G4700 Insomnia, unspecified: Secondary | ICD-10-CM | POA: Diagnosis not present

## 2020-04-26 DIAGNOSIS — Z299 Encounter for prophylactic measures, unspecified: Secondary | ICD-10-CM | POA: Diagnosis not present

## 2020-04-26 DIAGNOSIS — F419 Anxiety disorder, unspecified: Secondary | ICD-10-CM | POA: Diagnosis not present

## 2020-04-26 DIAGNOSIS — I10 Essential (primary) hypertension: Secondary | ICD-10-CM | POA: Diagnosis not present

## 2020-04-28 ENCOUNTER — Other Ambulatory Visit: Payer: Self-pay | Admitting: Physician Assistant

## 2020-05-06 ENCOUNTER — Other Ambulatory Visit: Payer: Self-pay | Admitting: Cardiology

## 2020-05-06 NOTE — Telephone Encounter (Signed)
This is a [name] pt, Dr. McDowell 

## 2020-05-11 DIAGNOSIS — I10 Essential (primary) hypertension: Secondary | ICD-10-CM | POA: Diagnosis not present

## 2020-05-12 ENCOUNTER — Ambulatory Visit (INDEPENDENT_AMBULATORY_CARE_PROVIDER_SITE_OTHER): Payer: Self-pay | Admitting: Cardiothoracic Surgery

## 2020-05-12 ENCOUNTER — Other Ambulatory Visit: Payer: Self-pay

## 2020-05-12 VITALS — BP 179/85 | HR 73 | Temp 97.7°F | Resp 20 | Ht 61.5 in | Wt 113.0 lb

## 2020-05-12 DIAGNOSIS — Z951 Presence of aortocoronary bypass graft: Secondary | ICD-10-CM

## 2020-05-12 DIAGNOSIS — I25110 Atherosclerotic heart disease of native coronary artery with unstable angina pectoris: Secondary | ICD-10-CM

## 2020-05-12 DIAGNOSIS — Z09 Encounter for follow-up examination after completed treatment for conditions other than malignant neoplasm: Secondary | ICD-10-CM

## 2020-05-12 NOTE — Progress Notes (Signed)
[address]       [place] [address]             [phone]      Felix M Marovich [facility] Medical Record #[id number] Date of Birth: 1936/09/15  Referring: [name], MD Primary Care: Glenda Chroman, MD Primary Cardiologist: Rozann Lesches, MD   Chief Complaint:   POST OP FOLLOW UP OPERATIVE REPORT DATE OF PROCEDURE:  03/02/2020 PREOPERATIVE DIAGNOSES:  Unstable angina with severe 2-vessel coronary artery disease. POSTOPERATIVE DIAGNOSES:  Unstable angina with severe 2-vessel coronary artery disease. SURGICAL PROCEDURE:  Coronary artery bypass grafting x3 with the left internal mammary artery to the left anterior descending coronary artery, reverse saphenous vein graft to the distal right coronary artery, reverse saphenous vein graft to the first  diagonal coronary artery with endoscopic vein harvesting, right and left greater saphenous veins, thigh open vein harvesting, left greater saphenous vein lower leg.  History of Present Illness:     Patient continues to progress following coronary artery bypass surgery 2 months ago.  She notes she has been increasing his physical activity.  She did not wish to go to cardiac rehab.  She has had no recurrent angina or evidence of congestive heart failure.  She is now off amiodarone for perioperative atrial fibrillation.  She notes no recurrent episodes that she is aware of.     Past Medical History:  Diagnosis Date  . Anemia   . Anxiety   . Arthritis   . Chronic constipation   . Claudication [facility])    Chronic occlusion of the right limb of aortic stent graft.  . Diverticulosis    Left sided noted on colonoscopy 02/2006  . Ectopic atrial tachycardia ([place])   . Esophageal reflux disease   . Essential hypertension   . Hiatal hernia   . History of TIA (transient ischemic attack)   . Hyperlipidemia   . Lumbar radiculopathy   . Macular degeneration   . Migraines   . Non-obstructive CAD    Status post  CABG April 2021 - Dr. Servando Snare  . Penetrating atherosclerotic ulcer of aorta (HCC)    Complex ulcerated plaque of the infrarenal abdominal aorta mild renal artery stenosis on the right side, normal left renal artery catheterization 2009, status post aortic stent graft  . Renal artery stenosis ([place])    a. Right renal artery stent April 2013 - Dr. Fletcher Anon;  b. 03/2014 PTA/stenting of RRA 2/2 ISR: 6x18 Herculink stent.  . Trigeminal neuralgia    Right  . Type 2 diabetes mellitus (HCC)      Social History   Tobacco Use  Smoking Status Former Smoker  . Packs/day: 0.20  . Years: 1.00  . Pack years: 0.20  . Types: Cigarettes  . Quit date: 11/13/1979  . Years since quitting: 40.5  Smokeless Tobacco Never Used  Tobacco Comment   "smoked a few months"    Social History   Substance and Sexual Activity  Alcohol Use No  . Alcohol/week: 0.0 standard drinks     Allergies  Allergen Reactions  . Cefixime Shortness Of Breath    Patient is not familiar with this listed allergy  . Conjugated Estrogens Other (See Comments)    REACTION: flushing  . Morphine And Related Other (See Comments)    Morphine injection Neck pain  . Codeine Other (See Comments)    REACTION: dizziness  . Iohexol      Desc: pt has anxiety and  an irregular heartbeat. contrast exacerbates this. pt was very uncomfortable after a 20cc bolus for an miroi. we did w/o iv 11/09 per dr. Kris Hartmann. pt will talk w/ md about this and will probably not get iv contrast in the future at her re, Onset Date: [id number]   . Adenosine Other (See Comments)    Elevated heart rate when doing stress test    Current Outpatient Medications  Medication Sig Dispense Refill  . acetaminophen (TYLENOL) 500 MG tablet Take 500 mg by mouth every 6 (six) hours as needed.    Marland Kitchen amLODipine (NORVASC) 5 MG tablet Take 1 tablet (5 mg total) by mouth daily. 30 tablet 1  . aspirin EC 325 MG EC tablet Take 1 tablet (325 mg total) by mouth daily. 30 tablet 0  .  Cholecalciferol (VITAMIN D3) 1000 UNITS CAPS Take 1,000 Units by mouth daily.     . clonazePAM (KLONOPIN) 0.5 MG tablet Take 0.25 mg by mouth 2 (two) times daily as needed for anxiety.     . metoprolol tartrate (LOPRESSOR) 50 MG tablet TAKE 1 TABLET BY MOUTH TWICE DAILY 180 tablet 1  . Multiple Vitamins-Minerals (PRESERVISION/LUTEIN) CAPS Take 1 capsule by mouth 2 (two) times daily.     . pantoprazole (PROTONIX) 40 MG tablet Take 40 mg by mouth daily.    . polyethylene glycol powder (GLYCOLAX/MIRALAX) 17 GM/SCOOP powder Take 17 g by mouth 2 (two) times a week. As needed    . rosuvastatin (CRESTOR) 20 MG tablet Take 1 tablet (20 mg total) by mouth at bedtime. 90 tablet 3   No current facility-administered medications for this visit.       Physical Exam: BP (!) 179/85   Pulse 73   Temp 97.7 F (36.5 C) (Skin)   Resp 20   Ht 5' 1.5" (1.562 m)   Wt 113 lb (51.3 kg)   SpO2 97% Comment: RA  BMI 21.01 kg/m  General appearance: alert, cooperative and appears stated age Lymph nodes: Cervical, supraclavicular, and axillary nodes normal. Resp: clear to auscultation bilaterally Back: symmetric, no curvature. ROM normal. No CVA tenderness. Cardio: regular rate and rhythm, S1, S2 normal, no murmur, click, rub or gallop GI: soft, non-tender; bowel sounds normal; no masses,  no organomegaly Extremities: extremities normal, atraumatic, no cyanosis or edema and Homans sign is negative, no sign of DVT Neurologic: Grossly normal    Diagnostic Studies & Laboratory data:     Recent Radiology Findings:   No results found.    Recent Lab Findings: Lab Results  Component Value Date   WBC 7.5 03/07/2020   HGB 12.0 03/07/2020   HCT 37.9 03/07/2020   PLT 234 03/07/2020   GLUCOSE 148 (H) 03/07/2020   CHOL 130 01/29/2020   TRIG 105 01/29/2020   HDL 38 (L) 01/29/2020   LDLCALC 71 01/29/2020   ALT 25 03/05/2020   AST 24 03/05/2020   NA 137 03/07/2020   K 3.9 03/07/2020   CL 102 03/07/2020    CREATININE 0.78 03/07/2020   BUN 16 03/07/2020   CO2 28 03/07/2020   TSH 1.644 Test methodology is 3rd generation TSH 12/20/2007   INR 1.4 (H) 03/02/2020   HGBA1C 6.4 (H) 03/01/2020      Assessment / Plan:   #1 status post coronary artery bypass grafting for unstable angina-patient making good progress postoperatively.  Wounds are all well-healed trace edema left lower extremity  #2 postop atrial fib-peers to remaining in sinus rhythm, amiodarone dose discontinued    Plan  to see the patient back as needed.    Medication Changes: No orders of the defined types were placed in this encounter.     Grace Isaac MD      [address] [place], [address] Office [phone]     05/12/2020 10:27 AM

## 2020-05-27 DIAGNOSIS — Z7189 Other specified counseling: Secondary | ICD-10-CM | POA: Diagnosis not present

## 2020-05-27 DIAGNOSIS — I10 Essential (primary) hypertension: Secondary | ICD-10-CM | POA: Diagnosis not present

## 2020-05-27 DIAGNOSIS — Z6821 Body mass index (BMI) 21.0-21.9, adult: Secondary | ICD-10-CM | POA: Diagnosis not present

## 2020-05-27 DIAGNOSIS — R5383 Other fatigue: Secondary | ICD-10-CM | POA: Diagnosis not present

## 2020-05-27 DIAGNOSIS — Z1339 Encounter for screening examination for other mental health and behavioral disorders: Secondary | ICD-10-CM | POA: Diagnosis not present

## 2020-05-27 DIAGNOSIS — H6122 Impacted cerumen, left ear: Secondary | ICD-10-CM | POA: Diagnosis not present

## 2020-05-27 DIAGNOSIS — Z299 Encounter for prophylactic measures, unspecified: Secondary | ICD-10-CM | POA: Diagnosis not present

## 2020-05-27 DIAGNOSIS — Z1331 Encounter for screening for depression: Secondary | ICD-10-CM | POA: Diagnosis not present

## 2020-05-27 DIAGNOSIS — Z0000 Encounter for general adult medical examination without abnormal findings: Secondary | ICD-10-CM | POA: Diagnosis not present

## 2020-05-27 DIAGNOSIS — H6982 Other specified disorders of Eustachian tube, left ear: Secondary | ICD-10-CM | POA: Diagnosis not present

## 2020-05-30 DIAGNOSIS — R5383 Other fatigue: Secondary | ICD-10-CM | POA: Diagnosis not present

## 2020-05-30 DIAGNOSIS — E7800 Pure hypercholesterolemia, unspecified: Secondary | ICD-10-CM | POA: Diagnosis not present

## 2020-05-30 DIAGNOSIS — Z79899 Other long term (current) drug therapy: Secondary | ICD-10-CM | POA: Diagnosis not present

## 2020-06-10 DIAGNOSIS — I10 Essential (primary) hypertension: Secondary | ICD-10-CM | POA: Diagnosis not present

## 2020-06-13 DIAGNOSIS — R350 Frequency of micturition: Secondary | ICD-10-CM | POA: Diagnosis not present

## 2020-06-13 DIAGNOSIS — Z299 Encounter for prophylactic measures, unspecified: Secondary | ICD-10-CM | POA: Diagnosis not present

## 2020-06-13 DIAGNOSIS — J309 Allergic rhinitis, unspecified: Secondary | ICD-10-CM | POA: Diagnosis not present

## 2020-06-13 DIAGNOSIS — I10 Essential (primary) hypertension: Secondary | ICD-10-CM | POA: Diagnosis not present

## 2020-06-13 DIAGNOSIS — N390 Urinary tract infection, site not specified: Secondary | ICD-10-CM | POA: Diagnosis not present

## 2020-06-13 DIAGNOSIS — I471 Supraventricular tachycardia: Secondary | ICD-10-CM | POA: Diagnosis not present

## 2020-07-08 DIAGNOSIS — I471 Supraventricular tachycardia: Secondary | ICD-10-CM | POA: Diagnosis not present

## 2020-07-08 DIAGNOSIS — E1165 Type 2 diabetes mellitus with hyperglycemia: Secondary | ICD-10-CM | POA: Diagnosis not present

## 2020-07-08 DIAGNOSIS — Z299 Encounter for prophylactic measures, unspecified: Secondary | ICD-10-CM | POA: Diagnosis not present

## 2020-07-08 DIAGNOSIS — I25119 Atherosclerotic heart disease of native coronary artery with unspecified angina pectoris: Secondary | ICD-10-CM | POA: Diagnosis not present

## 2020-07-08 DIAGNOSIS — I10 Essential (primary) hypertension: Secondary | ICD-10-CM | POA: Diagnosis not present

## 2020-07-12 DIAGNOSIS — I10 Essential (primary) hypertension: Secondary | ICD-10-CM | POA: Diagnosis not present

## 2020-07-15 ENCOUNTER — Other Ambulatory Visit: Payer: Self-pay

## 2020-07-15 ENCOUNTER — Encounter: Payer: Self-pay | Admitting: Cardiology

## 2020-07-15 ENCOUNTER — Ambulatory Visit (INDEPENDENT_AMBULATORY_CARE_PROVIDER_SITE_OTHER): Payer: Medicare Other | Admitting: Cardiology

## 2020-07-15 VITALS — BP 148/76 | HR 83 | Ht 61.5 in | Wt 113.0 lb

## 2020-07-15 DIAGNOSIS — I200 Unstable angina: Secondary | ICD-10-CM | POA: Diagnosis not present

## 2020-07-15 DIAGNOSIS — I10 Essential (primary) hypertension: Secondary | ICD-10-CM | POA: Diagnosis not present

## 2020-07-15 DIAGNOSIS — I4891 Unspecified atrial fibrillation: Secondary | ICD-10-CM | POA: Diagnosis not present

## 2020-07-15 DIAGNOSIS — I25119 Atherosclerotic heart disease of native coronary artery with unspecified angina pectoris: Secondary | ICD-10-CM | POA: Diagnosis not present

## 2020-07-15 DIAGNOSIS — I9789 Other postprocedural complications and disorders of the circulatory system, not elsewhere classified: Secondary | ICD-10-CM

## 2020-07-15 NOTE — Progress Notes (Signed)
Cardiology Office Note  Date: 07/15/2020   ID: [name], DOB 1935-12-15, MRN [id number]  PCP:  Glenda Chroman, MD  Cardiologist:  Rozann Lesches, MD Electrophysiologist:  None   Chief Complaint  Patient presents with  . Cardiac follow-up    History of Present Illness: [name] is an 84 y.o. female last seen in June.  She presents for a routine visit.  She tells me that she has had some walk-through angina, admits that she has not been as active however with her walking regimen.  She states that she is taking her medications regularly.  She had a follow-up visit with Dr. Servando Snare in July, I reviewed the note.  She does not describe any progressive palpitations to suggest recurring atrial fibrillation.  I reviewed her cardiac regimen which is stable and outlined below.  I also went over her home blood pressure checks which look quite good overall, better than today's.  Past Medical History:  Diagnosis Date  . Anemia   . Anxiety   . Arthritis   . CAD (coronary artery disease)    Status post CABG April 2021 - Dr. Servando Snare  . Chronic constipation   . Claudication [facility])    Chronic occlusion of the right limb of aortic stent graft.  . Diverticulosis    Left sided noted on colonoscopy 02/2006  . Ectopic atrial tachycardia ([place])   . Esophageal reflux disease   . Essential hypertension   . Hiatal hernia   . History of TIA (transient ischemic attack)   . Hyperlipidemia   . Lumbar radiculopathy   . Macular degeneration   . Migraines   . Penetrating atherosclerotic ulcer of aorta (HCC)    Complex ulcerated plaque of the infrarenal abdominal aorta mild renal artery stenosis on the right side, normal left renal artery catheterization 2009, status post aortic stent graft  . Renal artery stenosis ([place])    a. Right renal artery stent April 2013 - Dr. Fletcher Anon;  b. 03/2014 PTA/stenting of RRA 2/2 ISR: 6x18 Herculink stent.  . Trigeminal neuralgia    Right  . Type 2 diabetes  mellitus ([place])     Past Surgical History:  Procedure Laterality Date  . ABDOMINAL AORTAGRAM N/A 02/13/2012   Procedure: ABDOMINAL Maxcine Ham;  Surgeon: Wellington Hampshire, MD;  Location: [place] CATH LAB;  Service: Cardiovascular;  Laterality: N/A;  . ABDOMINAL AORTAGRAM N/A 03/17/2014   Procedure: ABDOMINAL Maxcine Ham;  Surgeon: Wellington Hampshire, MD;  Location: [place] CATH LAB;  Service: Cardiovascular;  Laterality: N/A;  . ABDOMINAL HYSTERECTOMY  1982  . BREAST BIOPSY Left 1990's  . CAROTID ENDARTERECTOMY Left 1993  . COLONOSCOPY    . CORONARY ARTERY BYPASS GRAFT N/A 03/02/2020   Procedure: CORONARY ARTERY BYPASS GRAFTING (CABG)x 3, LIMA TO LAD, SVG TO DIAGONAL, SVG TO RCA, WITH OPEN HARVESTING OF LEFT LOWER GREATER SAPHENOUS VEIN AND RIGHT AND LEFT GREATER SAPHENOUS VEIN HARVESTED ENDOSCOPICALLY;  Surgeon: Grace Isaac, MD;  Location: [place];  Service: Open Heart Surgery;  Laterality: N/A;  . Endologix powerlink bifurcated     Covered stent graft  . ENDOVEIN HARVEST OF GREATER SAPHENOUS VEIN Bilateral 03/02/2020   Procedure: [place] Of Greater Saphenous Vein;  Surgeon: Grace Isaac, MD;  Location: [place];  Service: Open Heart Surgery;  Laterality: Bilateral;  . LEFT HEART CATH AND CORONARY ANGIOGRAPHY N/A 01/28/2020   Procedure: LEFT HEART CATH AND CORONARY ANGIOGRAPHY;  Surgeon: Nelva Bush, MD;  Location: [place] CV LAB;  Service: Cardiovascular;  Laterality: N/A;  . LEFT HEART CATHETERIZATION WITH CORONARY ANGIOGRAM N/A 03/17/2014   Procedure: LEFT HEART CATHETERIZATION WITH CORONARY ANGIOGRAM;  Surgeon: Wellington Hampshire, MD;  Location: [place] CATH LAB;  Service: Cardiovascular;  Laterality: N/A;  . LUMBAR [place]  . PERCUTANEOUS STENT INTERVENTION Right 03/17/2014   Procedure: PERCUTANEOUS STENT INTERVENTION;  Surgeon: Wellington Hampshire, MD;  Location: [place] CATH LAB;  Service: Cardiovascular;  Laterality: Right;  Renal  . RENAL ANGIOGRAM N/A 02/13/2012   Procedure: RENAL  ANGIOGRAM;  Surgeon: Wellington Hampshire, MD;  Location: [place] CATH LAB;  Service: Cardiovascular;  Laterality: N/A;  . RENAL ANGIOGRAM N/A 03/17/2014   Procedure: RENAL ANGIOGRAM;  Surgeon: Wellington Hampshire, MD;  Location: [place] CATH LAB;  Service: Cardiovascular;  Laterality: N/A;  . TEE WITHOUT CARDIOVERSION N/A 03/02/2020   Procedure: TRANSESOPHAGEAL ECHOCARDIOGRAM (TEE);  Surgeon: Grace Isaac, MD;  Location: [place];  Service: Open Heart Surgery;  Laterality: N/A;  . TONSILLECTOMY AND ADENOIDECTOMY  1947    Current Outpatient Medications  Medication Sig Dispense Refill  . acetaminophen (TYLENOL) 500 MG tablet Take 500 mg by mouth every 6 (six) hours as needed.    Marland Kitchen amLODipine (NORVASC) 5 MG tablet Take 1 tablet (5 mg total) by mouth daily. 30 tablet 1  . aspirin EC 325 MG EC tablet Take 1 tablet (325 mg total) by mouth daily. 30 tablet 0  . Cholecalciferol (VITAMIN D3) 1000 UNITS CAPS Take 1,000 Units by mouth daily.     . clonazePAM (KLONOPIN) 0.5 MG tablet Take 0.25 mg by mouth 2 (two) times daily as needed for anxiety.     . metoprolol tartrate (LOPRESSOR) 50 MG tablet TAKE 1 TABLET BY MOUTH TWICE DAILY 180 tablet 1  . Multiple Vitamins-Minerals (PRESERVISION/LUTEIN) CAPS Take 1 capsule by mouth 2 (two) times daily.     . pantoprazole (PROTONIX) 40 MG tablet Take 40 mg by mouth daily.    . polyethylene glycol powder (GLYCOLAX/MIRALAX) 17 GM/SCOOP powder Take 17 g by mouth 2 (two) times a week. As needed    . rosuvastatin (CRESTOR) 20 MG tablet Take 1 tablet (20 mg total) by mouth at bedtime. 90 tablet 3   No current facility-administered medications for this visit.   Allergies:  Cefixime, Conjugated estrogens, Morphine and related, Codeine, Iohexol, and Adenosine   ROS:  No dizziness or syncope.  Physical Exam: VS:  BP (!) 148/76   Pulse 83   Ht 5' 1.5" (1.562 m)   Wt 113 lb (51.3 kg)   SpO2 98%   BMI 21.01 kg/m , BMI Body mass index is 21.01 kg/m.  Wt Readings from Last 3  Encounters:  07/15/20 113 lb (51.3 kg)  05/12/20 113 lb (51.3 kg)  04/13/20 113 lb 9.6 oz (51.5 kg)    General: Elderly woman, appears comfortable at rest. HEENT: Conjunctiva and lids normal, wearing a mask. Neck: Supple, no elevated JVP or carotid bruits, no thyromegaly. Lungs: Clear to auscultation, nonlabored breathing at rest. Cardiac: Regular rate and rhythm, no S3, soft systolic murmur, no pericardial rub. Extremities: No pitting edema, distal pulses 2+.  ECG:  An ECG dated 03/03/2020 was personally reviewed today and demonstrated:  Sinus rhythm with nonspecific T wave changes.  Recent Labwork: 03/03/2020: Magnesium 2.5 03/05/2020: ALT 25; AST 24 03/07/2020: BUN 16; Creatinine, Ser 0.78; Hemoglobin 12.0; Platelets 234; Potassium 3.9; Sodium 137     Component Value Date/Time   CHOL 130 01/29/2020 0202   TRIG 105  01/29/2020 0202   HDL 38 (L) 01/29/2020 0202   CHOLHDL 3.4 01/29/2020 0202   VLDL 21 01/29/2020 0202   LDLCALC 71 01/29/2020 0202    Other Studies Reviewed Today:  No interval cardiac testing for review today.  Assessment and Plan:  1.  Multivessel CAD status post CABG in April.  She is having some walk-through angina at this point, plans to increase regular walking regimen, she has not been as consistent recently.  Blood pressure control looks good at home.  Continue aspirin, Norvasc, Lopressor, and Crestor.  2.  Postoperative atrial fibrillation, no recurrences.  3.  Essential hypertension.  Blood pressure elevated today, but home blood pressure checks look much better.  No changes were made as yet.  Medication Adjustments/Labs and Tests Ordered: Current medicines are reviewed at length with the patient today.  Concerns regarding medicines are outlined above.   Tests Ordered: No orders of the defined types were placed in this encounter.   Medication Changes: No orders of the defined types were placed in this encounter.   Disposition:  Follow up 3 months  in the [place] office.  Signed, [name], MD, [facility] 07/15/2020 4:43 PM    [facility] at [facility] [address] [place], [place] [address] Phone: [phone]; Fax: [phone]

## 2020-07-15 NOTE — Patient Instructions (Signed)
Medication Instructions:  Your physician recommends that you continue on your current medications as directed. Please refer to the Current Medication list given to you today.  *If you need a refill on your cardiac medications before your next appointment, please call your pharmacy*   Lab Work: None today If you have labs (blood work) drawn today and your tests are completely normal, you will receive your results only by: . MyChart Message (if you have MyChart) OR . A paper copy in the mail If you have any lab test that is abnormal or we need to change your treatment, we will call you to review the results.   Testing/Procedures: None today   Follow-Up: At [facility], you and your health needs are our priority.  As part of our continuing mission to provide you with exceptional heart care, we have created designated Provider Care Teams.  These Care Teams include your primary Cardiologist (physician) and Advanced Practice Providers (APPs -  Physician Assistants and Nurse Practitioners) who all work together to provide you with the care you need, when you need it.  We recommend signing up for the patient portal called "MyChart".  Sign up information is provided on this After Visit Summary.  MyChart is used to connect with patients for Virtual Visits (Telemedicine).  Patients are able to view lab/test results, encounter notes, upcoming appointments, etc.  Non-urgent messages can be sent to your provider as well.   To learn more about what you can do with MyChart, go to [email or web address].    Your next appointment:   3 month(s)  The format for your next appointment:   In Person  Provider:   Samuel McDowell, MD   Other Instructions None      Thank you for choosing [facility] !         

## 2020-08-02 DIAGNOSIS — I471 Supraventricular tachycardia: Secondary | ICD-10-CM | POA: Diagnosis not present

## 2020-08-02 DIAGNOSIS — I10 Essential (primary) hypertension: Secondary | ICD-10-CM | POA: Diagnosis not present

## 2020-08-02 DIAGNOSIS — E1165 Type 2 diabetes mellitus with hyperglycemia: Secondary | ICD-10-CM | POA: Diagnosis not present

## 2020-08-02 DIAGNOSIS — I25119 Atherosclerotic heart disease of native coronary artery with unspecified angina pectoris: Secondary | ICD-10-CM | POA: Diagnosis not present

## 2020-08-02 DIAGNOSIS — Z299 Encounter for prophylactic measures, unspecified: Secondary | ICD-10-CM | POA: Diagnosis not present

## 2020-08-02 DIAGNOSIS — R109 Unspecified abdominal pain: Secondary | ICD-10-CM | POA: Diagnosis not present

## 2020-08-09 DIAGNOSIS — R109 Unspecified abdominal pain: Secondary | ICD-10-CM | POA: Diagnosis not present

## 2020-08-10 DIAGNOSIS — I471 Supraventricular tachycardia: Secondary | ICD-10-CM | POA: Diagnosis not present

## 2020-08-10 DIAGNOSIS — K6389 Other specified diseases of intestine: Secondary | ICD-10-CM | POA: Diagnosis not present

## 2020-08-10 DIAGNOSIS — I10 Essential (primary) hypertension: Secondary | ICD-10-CM | POA: Diagnosis not present

## 2020-08-10 DIAGNOSIS — F419 Anxiety disorder, unspecified: Secondary | ICD-10-CM | POA: Diagnosis not present

## 2020-08-10 DIAGNOSIS — J309 Allergic rhinitis, unspecified: Secondary | ICD-10-CM | POA: Diagnosis not present

## 2020-08-10 DIAGNOSIS — Z299 Encounter for prophylactic measures, unspecified: Secondary | ICD-10-CM | POA: Diagnosis not present

## 2020-08-11 DIAGNOSIS — I10 Essential (primary) hypertension: Secondary | ICD-10-CM | POA: Diagnosis not present

## 2020-08-17 ENCOUNTER — Encounter (INDEPENDENT_AMBULATORY_CARE_PROVIDER_SITE_OTHER): Payer: Medicare Other | Admitting: Ophthalmology

## 2020-08-31 ENCOUNTER — Telehealth: Payer: Self-pay | Admitting: *Deleted

## 2020-08-31 DIAGNOSIS — K6389 Other specified diseases of intestine: Secondary | ICD-10-CM | POA: Diagnosis not present

## 2020-08-31 NOTE — Telephone Encounter (Signed)
Patient is returning call.  °

## 2020-08-31 NOTE — Telephone Encounter (Signed)
° °  [facility] Pre-operative Risk Assessment    HEARTCARE STAFF: - Please ensure there is not already an duplicate clearance open for this procedure. - Under Visit Info/Reason for Call, type in Other and utilize the format Clearance MM/DD/YY or Clearance TBD. Do not use dashes or single digits. - If request is for dental extraction, please clarify the # of teeth to be extracted.  Request for surgical clearance:  1. What type of surgery is being performed? COLON/RECTAL SURGERY   2. When is this surgery scheduled? TBD   3. What type of clearance is required (medical clearance vs. Pharmacy clearance to hold med vs. Both)? MEDICAL  4. Are there any medications that need to be held prior to surgery and how long? ASA   5. Practice name and name of physician performing surgery? [facility]; DR. Harrell Gave WHITE   6. What is the office phone number? [phone]   7.   What is the office fax number? [phone] ATTN: JACQUELINE HAGGETT, RMA  8.   Anesthesia type (None, local, MAC, general) ? GENERAL   Julaine Hua 08/31/2020, 12:14 PM  _________________________________________________________________   (provider comments below)

## 2020-08-31 NOTE — Telephone Encounter (Signed)
Left VM

## 2020-09-01 NOTE — Telephone Encounter (Signed)
   Primary Cardiologist: Rozann Lesches, MD  Chart reviewed as part of pre-operative protocol coverage. Because of SHABRIA EGLEY past medical history and time since last visit, she will require a follow-up visit in order to better assess preoperative cardiovascular risk.   Due to abdominal pain, pt underwent CT imaging which revealed a mass in her colon. We received a preop clearance request for "colon/rectal surgery."  Per the patient, she is going to have a colonoscopy first. When I called to screen her, she reported some chest discomfort when carrying in groceries and several episodes of heart racing and palpitations - symptoms have developed since she last saw Dr. Domenic Polite in early September. Given her recent CABG in April of this year complicated by post-op Afib, I think she should be seen ASAP to evaluate chest pain and for possible heart monitor.    Please keep the December appt with Dr. Domenic Polite. Pt will need appt in [facility] ASAP.   Pre-op covering staff: - Please schedule appointment and call patient to inform them. If patient already had an upcoming appointment within acceptable timeframe, please add "pre-op clearance" to the appointment notes so provider is aware. - Please contact requesting surgeon's office via preferred method (i.e, phone, fax) to inform them of need for appointment prior to surgery.  If applicable, this message will also be routed to pharmacy pool and/or primary cardiologist for input on holding anticoagulant/antiplatelet agent as requested below so that this information is available to the clearing provider at time of patient's appointment.   Tami Lin Mala Gibbard, PA  09/01/2020, 9:06 AM

## 2020-09-01 NOTE — Progress Notes (Signed)
Cardiology Office Note  Date: 09/02/2020   ID: [name], [name] 01-18-1936, MRN [id number]  PCP:  Glenda Chroman, MD  Cardiologist:  Rozann Lesches, MD Electrophysiologist:  None   Chief Complaint: Preo op Clearance, CP, Palpitations  History of Present Illness: [name] is a 84 y.o. female with a history of CP, Palpitations, CAD, CABG x 3 on 02/2020, DM2, HTN, HLD, history of ectopic atrial tachycardia..  Last encounter with Dr. Domenic Polite 07/15/2020.  She described she had some walk-through angina.  She admitted she had not been as active with her walking regimen.  She was taking her medications regularly.  She does not describe any progressive palpitations to suggest recurrent atrial fibrillation.  She was continuing her aspirin, Norvasc, Lopressor, and Crestor.  She had no recurrence of atrial fibrillation.  Blood pressure during visit was elevated but home blood pressure checks looked much better.  There were no changes made to medications.  There is a telephone encounter for preop clearance request dated 08/31/2020.  The note describes an intestinal mass on CT scanning and she will be undergoing a colonoscopy soon.  She also had complained of chest pain when carrying groceries and increased episodes of palpitations/tachycardia.   Patient is here for cardiac clearance for upcoming colonoscopy for intestinal mass.  She is status post bypass surgery on April 2021.Dr. Pia Mau performed CABG x3 with LIMA-LAD, SVG to diagonal, SVG to RCA.  Patient states she had some recent chest pain while doing household tasks/sweeping the floor at home.  She states the episode lasted approximately 30 minutes.  Rated in severity 5-6/10.  Denied any radiation to neck, arm, back, jaw.  Denies any associated nausea, vomiting, or diaphoresis.  Also states she has been having palpitations almost every day.  She states heart sometimes feels like it races and sometimes skips beats.  She brings with her a log  of blood pressures and heart rates which seem to average in the range of 329J to 242A systolic and 83M to 19Q diastolic.  Denies any orthostatic symptoms, PND, orthopnea, bleeding, CVA or TIA-like symptoms.  Denies any claudication-like symptoms, DVT or PE-like symptoms, or lower extremity edema   Past Medical History:  Diagnosis Date  . Anemia   . Anxiety   . Arthritis   . CAD (coronary artery disease)    Status post CABG April 2021 - Dr. Servando Snare  . Chronic constipation   . Claudication [facility])    Chronic occlusion of the right limb of aortic stent graft.  . Diverticulosis    Left sided noted on colonoscopy 02/2006  . Ectopic atrial tachycardia ([place])   . Esophageal reflux disease   . Essential hypertension   . Hiatal hernia   . History of TIA (transient ischemic attack)   . Hyperlipidemia   . Lumbar radiculopathy   . Macular degeneration   . Migraines   . Penetrating atherosclerotic ulcer of aorta (HCC)    Complex ulcerated plaque of the infrarenal abdominal aorta mild renal artery stenosis on the right side, normal left renal artery catheterization 2009, status post aortic stent graft  . Renal artery stenosis ([place])    a. Right renal artery stent April 2013 - Dr. Fletcher Anon;  b. 03/2014 PTA/stenting of RRA 2/2 ISR: 6x18 Herculink stent.  . Trigeminal neuralgia    Right  . Type 2 diabetes mellitus ([place])     Past Surgical History:  Procedure Laterality Date  . ABDOMINAL AORTAGRAM N/A 02/13/2012   Procedure: ABDOMINAL  Maxcine Ham;  Surgeon: Wellington Hampshire, MD;  Location: [facility] CATH LAB;  Service: Cardiovascular;  Laterality: N/A;  . ABDOMINAL AORTAGRAM N/A 03/17/2014   Procedure: ABDOMINAL Maxcine Ham;  Surgeon: Wellington Hampshire, MD;  Location: [place] CATH LAB;  Service: Cardiovascular;  Laterality: N/A;  . ABDOMINAL HYSTERECTOMY  1982  . BREAST BIOPSY Left 1990's  . CAROTID ENDARTERECTOMY Left 1993  . COLONOSCOPY    . CORONARY ARTERY BYPASS GRAFT N/A 03/02/2020   Procedure: CORONARY ARTERY  BYPASS GRAFTING (CABG)x 3, LIMA TO LAD, SVG TO DIAGONAL, SVG TO RCA, WITH OPEN HARVESTING OF LEFT LOWER GREATER SAPHENOUS VEIN AND RIGHT AND LEFT GREATER SAPHENOUS VEIN HARVESTED ENDOSCOPICALLY;  Surgeon: Grace Isaac, MD;  Location: [place];  Service: Open Heart Surgery;  Laterality: N/A;  . Endologix powerlink bifurcated     Covered stent graft  . ENDOVEIN HARVEST OF GREATER SAPHENOUS VEIN Bilateral 03/02/2020   Procedure: [place] Of Greater Saphenous Vein;  Surgeon: Grace Isaac, MD;  Location: [place];  Service: Open Heart Surgery;  Laterality: Bilateral;  . LEFT HEART CATH AND CORONARY ANGIOGRAPHY N/A 01/28/2020   Procedure: LEFT HEART CATH AND CORONARY ANGIOGRAPHY;  Surgeon: Nelva Bush, MD;  Location: [place] CV LAB;  Service: Cardiovascular;  Laterality: N/A;  . LEFT HEART CATHETERIZATION WITH CORONARY ANGIOGRAM N/A 03/17/2014   Procedure: LEFT HEART CATHETERIZATION WITH CORONARY ANGIOGRAM;  Surgeon: Wellington Hampshire, MD;  Location: [place] CATH LAB;  Service: Cardiovascular;  Laterality: N/A;  . LUMBAR [place]  . PERCUTANEOUS STENT INTERVENTION Right 03/17/2014   Procedure: PERCUTANEOUS STENT INTERVENTION;  Surgeon: Wellington Hampshire, MD;  Location: [place] CATH LAB;  Service: Cardiovascular;  Laterality: Right;  Renal  . RENAL ANGIOGRAM N/A 02/13/2012   Procedure: RENAL ANGIOGRAM;  Surgeon: Wellington Hampshire, MD;  Location: [place] CATH LAB;  Service: Cardiovascular;  Laterality: N/A;  . RENAL ANGIOGRAM N/A 03/17/2014   Procedure: RENAL ANGIOGRAM;  Surgeon: Wellington Hampshire, MD;  Location: [place] CATH LAB;  Service: Cardiovascular;  Laterality: N/A;  . TEE WITHOUT CARDIOVERSION N/A 03/02/2020   Procedure: TRANSESOPHAGEAL ECHOCARDIOGRAM (TEE);  Surgeon: Grace Isaac, MD;  Location: [place];  Service: Open Heart Surgery;  Laterality: N/A;  . TONSILLECTOMY AND ADENOIDECTOMY  1947    Current Outpatient Medications  Medication Sig Dispense Refill  . acetaminophen (TYLENOL) 500 MG  tablet Take 500 mg by mouth every 6 (six) hours as needed.    Marland Kitchen amLODipine (NORVASC) 5 MG tablet Take 1 tablet (5 mg total) by mouth daily. 30 tablet 1  . aspirin EC 325 MG EC tablet Take 1 tablet (325 mg total) by mouth daily. 30 tablet 0  . Cholecalciferol (VITAMIN D3) 1000 UNITS CAPS Take 1,000 Units by mouth daily.     . clonazePAM (KLONOPIN) 0.5 MG tablet Take 0.25 mg by mouth 2 (two) times daily as needed for anxiety.     . docusate sodium (COLACE) 100 MG capsule Take 100 mg by mouth 2 (two) times daily.    . Lactobacillus (PROBIOTIC ACIDOPHILUS PO) Take by mouth.    . metoprolol tartrate (LOPRESSOR) 50 MG tablet TAKE 1 TABLET BY MOUTH TWICE DAILY 180 tablet 1  . Multiple Vitamins-Minerals (PRESERVISION/LUTEIN) CAPS Take 1 capsule by mouth 2 (two) times daily.     . pantoprazole (PROTONIX) 40 MG tablet Take 40 mg by mouth daily.    . polyethylene glycol powder (GLYCOLAX/MIRALAX) 17 GM/SCOOP powder Take 17 g by mouth 2 (two) times a week. As needed    .  rosuvastatin (CRESTOR) 20 MG tablet Take 1 tablet (20 mg total) by mouth at bedtime. 90 tablet 3  . nitroGLYCERIN (NITROSTAT) 0.4 MG SL tablet Place 1 tablet (0.4 mg total) under the tongue every 5 (five) minutes as needed for chest pain. MORE THAN 2 DOSES GO TO ED 25 tablet 3   No current facility-administered medications for this visit.   Allergies:  Cefixime, Conjugated estrogens, Morphine and related, Codeine, Iohexol, and Adenosine   Social History: The patient  reports that she quit smoking about 40 years ago. Her smoking use included cigarettes. She has a 0.20 pack-year smoking history. She has never used smokeless tobacco. She reports that she does not drink alcohol and does not use drugs.   Family History: The patient's family history includes Coronary artery disease in her mother; Heart attack in her father; Heart disease in her brother, father, and mother; Heart failure in her mother; Hypertension in her brother, father, mother,  sister, and sister; Kidney disease in her sister; Parkinson's disease in her sister; Stroke in her mother, sister, and sister.   ROS:  Please see the history of present illness. Otherwise, complete review of systems is positive for none.  All other systems are reviewed and negative.   Physical Exam: VS:  BP 138/68   Pulse 89   Ht 5' 1.5" (1.562 m)   Wt 112 lb (50.8 kg)   SpO2 98%   BMI 20.82 kg/m , BMI Body mass index is 20.82 kg/m.  Wt Readings from Last 3 Encounters:  09/02/20 112 lb (50.8 kg)  07/15/20 113 lb (51.3 kg)  05/12/20 113 lb (51.3 kg)    General: Patient appears comfortable at rest. Neck: Supple, no elevated JVP or carotid bruits, no thyromegaly. Lungs: Clear to auscultation, nonlabored breathing at rest. Cardiac: Regular rate and rhythm, no S3 or significant systolic murmur, no pericardial rub. Extremities: No pitting edema, distal pulses 2+. Skin: Warm and dry. Musculoskeletal: No kyphosis. Neuropsychiatric: Alert and oriented x3, affect grossly appropriate.  ECG:  An ECG dated 09/02/2020 was personally reviewed today and demonstrated:  Normal sinus rhythm rate of 86, possible left atrial enlargement, ST and T wave abnormality, consider lateral ischemia.  Recent Labwork: 03/03/2020: Magnesium 2.5 03/05/2020: ALT 25; AST 24 03/07/2020: BUN 16; Creatinine, Ser 0.78; Hemoglobin 12.0; Platelets 234; Potassium 3.9; Sodium 137     Component Value Date/Time   CHOL 130 01/29/2020 0202   TRIG 105 01/29/2020 0202   HDL 38 (L) 01/29/2020 0202   CHOLHDL 3.4 01/29/2020 0202   VLDL 21 01/29/2020 0202   LDLCALC 71 01/29/2020 0202    Other Studies Reviewed Today:  Echocardiogram 03/01/2020 1. Left ventricular ejection fraction, by estimation, is 55 to 60%. The left ventricle has normal function. The left ventricle has no regional wall motion abnormalities. There is mild left ventricular hypertrophy of the basal-septal segment. Left ventricular diastolic parameters  are indeterminate. 2. Right ventricular systolic function is normal. The right ventricular size is normal. There is normal pulmonary artery systolic pressure. 3. The mitral valve is normal in structure. Mild mitral valve regurgitation. No evidence of mitral stenosis. 4. The aortic valve is tricuspid. Aortic valve regurgitation is not visualized. Mild to moderate aortic valve sclerosis/calcification is present, without any evidence of aortic stenosis. 5. The inferior vena cava is normal in size with greater than 50% respiratory variability, suggesting right atrial pressure of 3 mmHg. Comparison(s): No significant change from prior study.   01/28/2020 LEFT HEART CATH AND CORONARY ANGIOGRAPHY  Conclusion  Conclusions: 1. Severe two-vessel coronary artery disease, including 70% proximal LAD stenosis involving large D1 branch, as well as heavily calcified 99% mid RCA stenosis with faint left-to-right collaterals.  There is mild diffuse disease involving the LMCA and LCx. 2. Normal left ventricular systolic function and filling pressure.  Recommendations: 1. As the patient experienced angina during the procedure and was found to have significant two-vessel coronary artery disease, we will admit her for medical optimization and cardiac surgery consultation for CABG.  If Ms. Agar is not a surgical candidate, we could consider PCI to the LAD/D1, though she would need to be challenged with a P2Y12 inhibitor first given history of significant epistaxis in the past while on clopidogrel.  I do not believe that the RCA stenosis is treatable at this time from a percutaneous approach, given heavy calcification and severe tortuosity that would make atherectomy difficult and high risk for dissection or perforation. 2. Aggressive secondary prevention.  We will check a lipid panel with AM labs to determine need for escalation of statin therapy. 3. Obtain echocardiogram.  Diagnostic Dominance:  Right    CT Abdomen / Pelvis 08/10/2020 IMPRESSION:  Abnormal appearance of the right colon. There is a right colo  colonic intussusception. Wall appears thickened, and there are  abnormally enlarged adjacent right mesenteric lymph nodes.  Appearance is concerning for possible colonic mass/malignancy with  associated intussusception and mesenteric adenopathy. No evidence of  obstruction.   Large stool burden throughout the colon.   Aortoiliac atherosclerosis. Stent graft in place with apparent  occlusion of the right common iliac limb.   These results were called by telephone at the time of interpretation  on 08/10/2020 at 10:47 am to provider [facility] , who verbally  acknowledged these results    Assessment and Plan:  1. Preoperative clearance   2. Chest pain, unspecified type   3. Palpitations   4. Coronary artery disease involving native coronary artery of native heart without angina pectoris   5. Mixed hyperlipidemia   6. Essential hypertension, benign    1. Preoperative clearance Patient has abdominal mass per CT imaging. Will undergo colonoscopy and possible surgery later.  Recent complaints of chest pain after having three-vessel bypass in April.  Chest pain has typical and atypical features.  Recent complaint of palpitations.  We will give the patient as needed nitroglycerin and order ZIO monitor for palpitations.  Not clear for colonoscopy pending results monitor.  2. Chest pain, unspecified type Recent chest discomfort recently when carrying groceries. She is s/p CABG x 3 on 02/2020.  Recent complaints of chest pain while doing household chores/sweeping the floor.  Chest pain was anterior without radiation to neck, arm, back or jaw.  Denied any associated nausea, vomiting or diaphoresis.  Please order a bottle of nitroglycerin 0.4 mg sublingual as needed for chest pain.  Continue aspirin 325 mg p.o. daily.  3. Palpitations Recent episodes of palpitations and heart  racing.  Please get 7-day ZIO monitor  4. Coronary artery disease involving native coronary artery of native heart without angina pectoris CABG x 3 with LIMA-LAD, SVG to diagonal, SVG to RCA on 03/02/2020.  Cardiac catheterization 01/18/2020 showed severe two-vessel coronary disease including 70% proximal LAD stenosis involving a large D1 branch as well as heavily calcified 99% mid RCA stenosis with faint left-to-right collaterals.  Mild diffuse disease involving LMCA and LAD CX.  Recommendations noted could consider PCI to LAD-D1 though she would need to be challenged with P2 Y 12 inhibitor  given history of epistaxis while on Plavix.  It was believed that RCA stenosis was not treatable from a percutaneous approach given heavy calcification and severe tortuosity that would make atherectomy difficult and high risk for dissection or perforation.  5. Mixed hyperlipidemia Continue Crestor 20 mg daily.Recent lipid panel 01/29/2020: TC 130, TG 105, HDL 38, LDL 71.  6.  Essential hypertension Blood pressure is 138/68 today.  She brings with her a log of blood pressures and heart rates which seem to average in the range of 748O to 707E systolic and 67J to 44B diastolic.  Continue amlodipine 5 mg daily, metoprolol 50 mg p.o. twice daily.  Medication Adjustments/Labs and Tests Ordered: Current medicines are reviewed at length with the patient today.  Concerns regarding medicines are outlined above.   Disposition: Follow-up with Dr Domenic Polite or APP 1 month  Signed, Levell July, NP 09/02/2020 8:54 AM    [facility] [address], [place], [place] [address] Phone: [phone]; Fax: [phone]

## 2020-09-01 NOTE — Telephone Encounter (Signed)
S/w the pt and advised of recommendations per Pre Op team tp will need to be seen for cp as well as pre op clearance. Pt has been scheduled to see Levell July, NP 09/02/20 @ 8 am. Pt is agreeable to plan of care and thanked mer for the help. I will forward notes to Levell July, NP for appt tomorrow. I will remove from the pre op call back pool.

## 2020-09-02 ENCOUNTER — Ambulatory Visit (INDEPENDENT_AMBULATORY_CARE_PROVIDER_SITE_OTHER): Payer: Medicare Other | Admitting: Family Medicine

## 2020-09-02 ENCOUNTER — Encounter: Payer: Self-pay | Admitting: Family Medicine

## 2020-09-02 VITALS — BP 138/68 | HR 89 | Ht 61.5 in | Wt 112.0 lb

## 2020-09-02 DIAGNOSIS — R079 Chest pain, unspecified: Secondary | ICD-10-CM | POA: Diagnosis not present

## 2020-09-02 DIAGNOSIS — I10 Essential (primary) hypertension: Secondary | ICD-10-CM

## 2020-09-02 DIAGNOSIS — E782 Mixed hyperlipidemia: Secondary | ICD-10-CM | POA: Diagnosis not present

## 2020-09-02 DIAGNOSIS — I2510 Atherosclerotic heart disease of native coronary artery without angina pectoris: Secondary | ICD-10-CM | POA: Diagnosis not present

## 2020-09-02 DIAGNOSIS — Z01818 Encounter for other preprocedural examination: Secondary | ICD-10-CM | POA: Diagnosis not present

## 2020-09-02 DIAGNOSIS — R002 Palpitations: Secondary | ICD-10-CM

## 2020-09-02 MED ORDER — NITROGLYCERIN 0.4 MG SL SUBL
0.4000 mg | SUBLINGUAL_TABLET | SUBLINGUAL | 3 refills | Status: DC | PRN
Start: 2020-09-02 — End: 2021-07-19

## 2020-09-02 NOTE — Patient Instructions (Signed)
Your physician recommends that you schedule a follow-up appointment in: [employer], NP  Your physician has recommended you make the following change in your medication:   TAKE NITROGLYCERIN 0.4 MG UNDER THE TONGUE EVERY 5-10 MINS AS NEEDED FOR CHEST PAIN - MORE THAN 2 DOSES WITH NO RELIEF PLEASE GO TO ED   ZIO PATCH FOR 7 DAYS   Thank you for choosing [facility]!!

## 2020-09-05 ENCOUNTER — Encounter: Payer: Self-pay | Admitting: Gastroenterology

## 2020-09-05 ENCOUNTER — Ambulatory Visit (INDEPENDENT_AMBULATORY_CARE_PROVIDER_SITE_OTHER): Payer: Medicare Other | Admitting: Gastroenterology

## 2020-09-05 VITALS — BP 122/80 | HR 74 | Ht 61.5 in | Wt 112.0 lb

## 2020-09-05 DIAGNOSIS — K6389 Other specified diseases of intestine: Secondary | ICD-10-CM | POA: Diagnosis not present

## 2020-09-05 MED ORDER — SUPREP BOWEL PREP KIT 17.5-3.13-1.6 GM/177ML PO SOLN
1.0000 | ORAL | 0 refills | Status: DC
Start: 2020-09-05 — End: 2021-05-24

## 2020-09-05 NOTE — Patient Instructions (Signed)
If you are age 82 or older, your body mass index should be between 23-30. Your Body mass index is 20.82 kg/m. If this is out of the aforementioned range listed, please consider follow up with your Primary Care Provider.  If you are age 56 or younger, your body mass index should be between 19-25. Your Body mass index is 20.82 kg/m. If this is out of the aformentioned range listed, please consider follow up with your Primary Care Provider.   You have been scheduled for a colonoscopy. Please follow written instructions given to you at your visit today.  Please pick up your prep supplies at the pharmacy within the next 1-3 days. If you use inhalers (even only as needed), please bring them with you on the day of your procedure.  Due to recent changes in healthcare laws, you may see the results of your imaging and laboratory studies on MyChart before your provider has had a chance to review them.  We understand that in some cases there may be results that are confusing or concerning to you. Not all laboratory results come back in the same time frame and the provider may be waiting for multiple results in order to interpret others.  Please give [place] 48 hours in order for your provider to thoroughly review all the results before contacting the office for clarification of your results.   Thank you for entrusting me with your care and choosing [facility].  Dr Ardis Hughs

## 2020-09-05 NOTE — H&P (View-Only) (Signed)
HPI: This is a very pleasant 84 year old woman who was referred by Dr. Nadeen Landau for colon mass  She believes she had a colonoscopy 12 to 14 years ago and it was normal.  She tends to have chronic constipation alternating with looser stools.  She takes stool softeners on a regular basis.  For several months she has had intermittent lower abdominal pains.  Never nausea or vomiting, never significant abdominal distention.  She has seen no blood in her stool.  Her weight is down 10 or 15 pounds in the past 6 months or so.  Colon cancer does not run in her family  She had coronary artery bypass grafting 6 months ago  Her abdominal pain led to imaging, see the CT scan below.  This led to surgical evaluation Dr. Annye English who has sent her to me to consider colonoscopy.  Late last week she underwent cardiac clearance evaluation.  I had a nice discussion with Dr. Johnny Bridge about that.  Old Data Reviewed: IMPRESSION of CT AP with IV and oral contrast 07/2020.: Abnormal appearance of the right colon. There is a right colo colonic intussusception. Wall appears thickened, and there are  abnormally enlarged adjacent right mesenteric lymph nodes. Appearance is concerning for possible colonic mass/malignancy with  associated intussusception and mesenteric adenopathy. No evidence of  obstruction.  Large stool burden throughout the colon.  Aortoiliac atherosclerosis. Stent graft in place with apparent  occlusion of the right common iliac limb.   Cbc 02/2020 was normal   Review of systems: Pertinent positive and negative review of systems were noted in the above HPI section. All other review negative.   Past Medical History:  Diagnosis Date  . Anemia   . Anxiety   . Arthritis   . CAD (coronary artery disease)    Status post CABG April 2021 - Dr. Servando Snare  . Chronic constipation   . Claudication [facility])    Chronic occlusion of the right limb of aortic stent graft.  . Diverticulosis     Left sided noted on colonoscopy 02/2006  . Ectopic atrial tachycardia ([place])   . Esophageal reflux disease   . Essential hypertension   . Hiatal hernia   . History of TIA (transient ischemic attack)   . Hyperlipidemia   . Lumbar radiculopathy   . Macular degeneration   . Migraines   . Penetrating atherosclerotic ulcer of aorta (HCC)    Complex ulcerated plaque of the infrarenal abdominal aorta mild renal artery stenosis on the right side, normal left renal artery catheterization 2009, status post aortic stent graft  . Renal artery stenosis ([place])    a. Right renal artery stent April 2013 - Dr. Fletcher Anon;  b. 03/2014 PTA/stenting of RRA 2/2 ISR: 6x18 Herculink stent.  . Trigeminal neuralgia    Right  . Type 2 diabetes mellitus ([place])     Past Surgical History:  Procedure Laterality Date  . ABDOMINAL AORTAGRAM N/A 02/13/2012   Procedure: ABDOMINAL Maxcine Ham;  Surgeon: Wellington Hampshire, MD;  Location: [place] CATH LAB;  Service: Cardiovascular;  Laterality: N/A;  . ABDOMINAL AORTAGRAM N/A 03/17/2014   Procedure: ABDOMINAL Maxcine Ham;  Surgeon: Wellington Hampshire, MD;  Location: [place] CATH LAB;  Service: Cardiovascular;  Laterality: N/A;  . ABDOMINAL HYSTERECTOMY  1982  . BREAST BIOPSY Left 1990's  . CAROTID ENDARTERECTOMY Left 1993  . COLONOSCOPY    . CORONARY ARTERY BYPASS GRAFT N/A 03/02/2020   Procedure: CORONARY ARTERY BYPASS GRAFTING (CABG)x 3, LIMA TO LAD, SVG  TO DIAGONAL, SVG TO RCA, WITH OPEN HARVESTING OF LEFT LOWER GREATER SAPHENOUS VEIN AND RIGHT AND LEFT GREATER SAPHENOUS VEIN HARVESTED ENDOSCOPICALLY;  Surgeon: Grace Isaac, MD;  Location: [place];  Service: Open Heart Surgery;  Laterality: N/A;  . Endologix powerlink bifurcated     Covered stent graft  . ENDOVEIN HARVEST OF GREATER SAPHENOUS VEIN Bilateral 03/02/2020   Procedure: [place] Of Greater Saphenous Vein;  Surgeon: Grace Isaac, MD;  Location: [place];  Service: Open Heart Surgery;  Laterality: Bilateral;  . LEFT HEART  CATH AND CORONARY ANGIOGRAPHY N/A 01/28/2020   Procedure: LEFT HEART CATH AND CORONARY ANGIOGRAPHY;  Surgeon: Nelva Bush, MD;  Location: [place] CV LAB;  Service: Cardiovascular;  Laterality: N/A;  . LEFT HEART CATHETERIZATION WITH CORONARY ANGIOGRAM N/A 03/17/2014   Procedure: LEFT HEART CATHETERIZATION WITH CORONARY ANGIOGRAM;  Surgeon: Wellington Hampshire, MD;  Location: [place] CATH LAB;  Service: Cardiovascular;  Laterality: N/A;  . LUMBAR [place]  . PERCUTANEOUS STENT INTERVENTION Right 03/17/2014   Procedure: PERCUTANEOUS STENT INTERVENTION;  Surgeon: Wellington Hampshire, MD;  Location: [place] CATH LAB;  Service: Cardiovascular;  Laterality: Right;  Renal  . RENAL ANGIOGRAM N/A 02/13/2012   Procedure: RENAL ANGIOGRAM;  Surgeon: Wellington Hampshire, MD;  Location: [place] CATH LAB;  Service: Cardiovascular;  Laterality: N/A;  . RENAL ANGIOGRAM N/A 03/17/2014   Procedure: RENAL ANGIOGRAM;  Surgeon: Wellington Hampshire, MD;  Location: [place] CATH LAB;  Service: Cardiovascular;  Laterality: N/A;  . TEE WITHOUT CARDIOVERSION N/A 03/02/2020   Procedure: TRANSESOPHAGEAL ECHOCARDIOGRAM (TEE);  Surgeon: Grace Isaac, MD;  Location: [place];  Service: Open Heart Surgery;  Laterality: N/A;  . TONSILLECTOMY AND ADENOIDECTOMY  1947    Current Outpatient Medications  Medication Sig Dispense Refill  . acetaminophen (TYLENOL) 500 MG tablet Take 500 mg by mouth every 6 (six) hours as needed.    Marland Kitchen amLODipine (NORVASC) 5 MG tablet Take 1 tablet (5 mg total) by mouth daily. 30 tablet 1  . aspirin EC 325 MG EC tablet Take 1 tablet (325 mg total) by mouth daily. 30 tablet 0  . Cholecalciferol (VITAMIN D3) 1000 UNITS CAPS Take 1,000 Units by mouth daily.     . clonazePAM (KLONOPIN) 0.5 MG tablet Take 0.25 mg by mouth 2 (two) times daily as needed for anxiety.     . docusate sodium (COLACE) 100 MG capsule Take 100 mg by mouth 2 (two) times daily.    . Lactobacillus (PROBIOTIC ACIDOPHILUS PO) Take by mouth.    . metoprolol  tartrate (LOPRESSOR) 50 MG tablet TAKE 1 TABLET BY MOUTH TWICE DAILY 180 tablet 1  . Multiple Vitamins-Minerals (PRESERVISION/LUTEIN) CAPS Take 1 capsule by mouth 2 (two) times daily.     . nitroGLYCERIN (NITROSTAT) 0.4 MG SL tablet Place 1 tablet (0.4 mg total) under the tongue every 5 (five) minutes as needed for chest pain. MORE THAN 2 DOSES GO TO ED 25 tablet 3  . rosuvastatin (CRESTOR) 20 MG tablet Take 1 tablet (20 mg total) by mouth at bedtime. 90 tablet 3  . pantoprazole (PROTONIX) 40 MG tablet Take 40 mg by mouth daily. (Patient not taking: Reported on 09/05/2020)    . polyethylene glycol powder (GLYCOLAX/MIRALAX) 17 GM/SCOOP powder Take 17 g by mouth 2 (two) times a week. As needed (Patient not taking: Reported on 09/05/2020)     No current facility-administered medications for this visit.    Allergies as of 09/05/2020 - Review Complete 09/05/2020  Allergen Reaction Noted  . Cefixime Shortness Of Breath 09/14/2008  . Conjugated estrogens Other (See Comments) 09/14/2008  . Morphine and related Other (See Comments) 09/13/2011  . Codeine Other (See Comments) 09/14/2008  . Iohexol  09/30/2008  . Adenosine Other (See Comments) 09/13/2011    Family History  Problem Relation Age of Onset  . Heart failure Mother   . Coronary artery disease Mother   . Heart disease Mother   . Hypertension Mother   . Stroke Mother   . Heart attack Father   . Heart disease Father   . Hypertension Father   . Heart disease Brother   . Hypertension Sister   . Parkinson's disease Sister   . Kidney disease Sister   . Stroke Sister   . Hypertension Brother   . Stroke Sister   . Hypertension Sister   . Breast cancer Neg Hx     Social History   Socioeconomic History  . Marital status: Widowed    Spouse name: Not on file  . Number of children: 1  . Years of education: 39  . Highest education level: Not on file  Occupational History  . Occupation: Retired    Comment: Scientist, clinical (histocompatibility and immunogenetics) at [employer]  . Smoking status: Former Smoker    Packs/day: 0.20    Years: 1.00    Pack years: 0.20    Types: Cigarettes    Quit date: 11/13/1979    Years since quitting: 40.8  . Smokeless tobacco: Never Used  . Tobacco comment: "smoked a few months"  Vaping Use  . Vaping Use: Never used  Substance and Sexual Activity  . Alcohol use: No    Alcohol/week: 0.0 standard drinks  . Drug use: Never  . Sexual activity: Not on file  Other Topics Concern  . Not on file  Social History Narrative   Drinks 1/2 caf coffee 3-4 cups a day    Social Determinants of Health   Financial Resource Strain:   . Difficulty of Paying Living Expenses: Not on file  Food Insecurity:   . Worried About Charity fundraiser in the Last Year: Not on file  . Ran Out of Food in the Last Year: Not on file  Transportation Needs:   . Lack of Transportation (Medical): Not on file  . Lack of Transportation (Non-Medical): Not on file  Physical Activity:   . Days of Exercise per Week: Not on file  . Minutes of Exercise per Session: Not on file  Stress:   . Feeling of Stress : Not on file  Social Connections:   . Frequency of Communication with Friends and Family: Not on file  . Frequency of Social Gatherings with Friends and Family: Not on file  . Attends Religious Services: Not on file  . Active Member of Clubs or Organizations: Not on file  . Attends Archivist Meetings: Not on file  . Marital Status: Not on file  Intimate Partner Violence:   . Fear of Current or Ex-Partner: Not on file  . Emotionally Abused: Not on file  . Physically Abused: Not on file  . Sexually Abused: Not on file     Physical Exam: BP 122/80   Pulse 74   Ht 5' 1.5" (1.562 m)   Wt 112 lb (50.8 kg)   SpO2 94%   BMI 20.82 kg/m  Constitutional: generally well-appearing Psychiatric: alert and oriented x3 Eyes: extraocular movements intact Mouth: oral pharynx moist, no lesions Neck: supple no  lymphadenopathy Cardiovascular: heart regular rate and rhythm Lungs: clear to auscultation bilaterally Abdomen: soft, nontender, nondistended, no obvious ascites, no peritoneal signs, normal bowel sounds Extremities: no lower extremity edema bilaterally Skin: no lesions on visible extremities   Assessment and plan: 84 y.o. female with intermittent abdominal pains, colon mass in right colon  The mass is symptomatic, intermittently causing volvulus.  These must be partial and intermittent because she certainly has not had anything as dramatic as a bowel obstruction would be.  She understands that she needs colonoscopy and likely will need right colon resection as well.  She was in her cardiology office late last week and the provider that saw her did not think she was clear for colonoscopy without further testing.  I had a very nice discussion with Dr. Johnny Bridge who knows her quite well from a cardiac perspective.  He wants to see her himself in person in the office before making a final decision about further cardiac testing versus going ahead with colonoscopy and treating things medically from a cardiac perspective.  We are going to plan and pick a day for colonoscopy about 3 or 4 weeks from now.  I think that should give ample time for cardiac decision to be made.  If not then we can always push it out another week or 2.  Please see the "Patient Instructions" section for addition details about the plan.   Owens Loffler, MD [facility] 09/05/2020, 3:29 PM  Cc: Glenda Chroman, MD  Total time on date of encounter was 45  minutes (this included time spent preparing to see the patient reviewing records; obtaining and/or reviewing separately obtained history; performing a medically appropriate exam and/or evaluation; counseling and educating the patient and family if present; ordering medications, tests or procedures if applicable; and documenting clinical information in the health  record).

## 2020-09-05 NOTE — Addendum Note (Signed)
Addended by: Julian Hy T on: 09/05/2020 11:53 AM   Modules accepted: Orders

## 2020-09-05 NOTE — Progress Notes (Signed)
HPI: This is a very pleasant 84 year old woman who was referred by Dr. Nadeen Landau for colon mass  She believes she had a colonoscopy 12 to 14 years ago and it was normal.  She tends to have chronic constipation alternating with looser stools.  She takes stool softeners on a regular basis.  For several months she has had intermittent lower abdominal pains.  Never nausea or vomiting, never significant abdominal distention.  She has seen no blood in her stool.  Her weight is down 10 or 15 pounds in the past 6 months or so.  Colon cancer does not run in her family  She had coronary artery bypass grafting 6 months ago  Her abdominal pain led to imaging, see the CT scan below.  This led to surgical evaluation Dr. Annye English who has sent her to me to consider colonoscopy.  Late last week she underwent cardiac clearance evaluation.  I had a nice discussion with Dr. Johnny Bridge about that.  Old Data Reviewed: IMPRESSION of CT AP with IV and oral contrast 07/2020.: Abnormal appearance of the right colon. There is a right colo colonic intussusception. Wall appears thickened, and there are  abnormally enlarged adjacent right mesenteric lymph nodes. Appearance is concerning for possible colonic mass/malignancy with  associated intussusception and mesenteric adenopathy. No evidence of  obstruction.  Large stool burden throughout the colon.  Aortoiliac atherosclerosis. Stent graft in place with apparent  occlusion of the right common iliac limb.   Cbc 02/2020 was normal   Review of systems: Pertinent positive and negative review of systems were noted in the above HPI section. All other review negative.   Past Medical History:  Diagnosis Date  . Anemia   . Anxiety   . Arthritis   . CAD (coronary artery disease)    Status post CABG April 2021 - Dr. Servando Snare  . Chronic constipation   . Claudication [facility])    Chronic occlusion of the right limb of aortic stent graft.  . Diverticulosis     Left sided noted on colonoscopy 02/2006  . Ectopic atrial tachycardia ([place])   . Esophageal reflux disease   . Essential hypertension   . Hiatal hernia   . History of TIA (transient ischemic attack)   . Hyperlipidemia   . Lumbar radiculopathy   . Macular degeneration   . Migraines   . Penetrating atherosclerotic ulcer of aorta (HCC)    Complex ulcerated plaque of the infrarenal abdominal aorta mild renal artery stenosis on the right side, normal left renal artery catheterization 2009, status post aortic stent graft  . Renal artery stenosis ([place])    a. Right renal artery stent April 2013 - Dr. Fletcher Anon;  b. 03/2014 PTA/stenting of RRA 2/2 ISR: 6x18 Herculink stent.  . Trigeminal neuralgia    Right  . Type 2 diabetes mellitus ([place])     Past Surgical History:  Procedure Laterality Date  . ABDOMINAL AORTAGRAM N/A 02/13/2012   Procedure: ABDOMINAL Maxcine Ham;  Surgeon: Wellington Hampshire, MD;  Location: [place] CATH LAB;  Service: Cardiovascular;  Laterality: N/A;  . ABDOMINAL AORTAGRAM N/A 03/17/2014   Procedure: ABDOMINAL Maxcine Ham;  Surgeon: Wellington Hampshire, MD;  Location: [place] CATH LAB;  Service: Cardiovascular;  Laterality: N/A;  . ABDOMINAL HYSTERECTOMY  1982  . BREAST BIOPSY Left 1990's  . CAROTID ENDARTERECTOMY Left 1993  . COLONOSCOPY    . CORONARY ARTERY BYPASS GRAFT N/A 03/02/2020   Procedure: CORONARY ARTERY BYPASS GRAFTING (CABG)x 3, LIMA TO LAD, SVG  TO DIAGONAL, SVG TO RCA, WITH OPEN HARVESTING OF LEFT LOWER GREATER SAPHENOUS VEIN AND RIGHT AND LEFT GREATER SAPHENOUS VEIN HARVESTED ENDOSCOPICALLY;  Surgeon: Grace Isaac, MD;  Location: [place];  Service: Open Heart Surgery;  Laterality: N/A;  . Endologix powerlink bifurcated     Covered stent graft  . ENDOVEIN HARVEST OF GREATER SAPHENOUS VEIN Bilateral 03/02/2020   Procedure: [place] Ropes Of Greater Saphenous Vein;  Surgeon: Grace Isaac, MD;  Location: [place];  Service: Open Heart Surgery;  Laterality: Bilateral;  . LEFT HEART  CATH AND CORONARY ANGIOGRAPHY N/A 01/28/2020   Procedure: LEFT HEART CATH AND CORONARY ANGIOGRAPHY;  Surgeon: Nelva Bush, MD;  Location: [place] CV LAB;  Service: Cardiovascular;  Laterality: N/A;  . LEFT HEART CATHETERIZATION WITH CORONARY ANGIOGRAM N/A 03/17/2014   Procedure: LEFT HEART CATHETERIZATION WITH CORONARY ANGIOGRAM;  Surgeon: Wellington Hampshire, MD;  Location: [place] CATH LAB;  Service: Cardiovascular;  Laterality: N/A;  . LUMBAR [place]  . PERCUTANEOUS STENT INTERVENTION Right 03/17/2014   Procedure: PERCUTANEOUS STENT INTERVENTION;  Surgeon: Wellington Hampshire, MD;  Location: [place] CATH LAB;  Service: Cardiovascular;  Laterality: Right;  Renal  . RENAL ANGIOGRAM N/A 02/13/2012   Procedure: RENAL ANGIOGRAM;  Surgeon: Wellington Hampshire, MD;  Location: [place] CATH LAB;  Service: Cardiovascular;  Laterality: N/A;  . RENAL ANGIOGRAM N/A 03/17/2014   Procedure: RENAL ANGIOGRAM;  Surgeon: Wellington Hampshire, MD;  Location: [place] CATH LAB;  Service: Cardiovascular;  Laterality: N/A;  . TEE WITHOUT CARDIOVERSION N/A 03/02/2020   Procedure: TRANSESOPHAGEAL ECHOCARDIOGRAM (TEE);  Surgeon: Grace Isaac, MD;  Location: [place];  Service: Open Heart Surgery;  Laterality: N/A;  . TONSILLECTOMY AND ADENOIDECTOMY  1947    Current Outpatient Medications  Medication Sig Dispense Refill  . acetaminophen (TYLENOL) 500 MG tablet Take 500 mg by mouth every 6 (six) hours as needed.    Marland Kitchen amLODipine (NORVASC) 5 MG tablet Take 1 tablet (5 mg total) by mouth daily. 30 tablet 1  . aspirin EC 325 MG EC tablet Take 1 tablet (325 mg total) by mouth daily. 30 tablet 0  . Cholecalciferol (VITAMIN D3) 1000 UNITS CAPS Take 1,000 Units by mouth daily.     . clonazePAM (KLONOPIN) 0.5 MG tablet Take 0.25 mg by mouth 2 (two) times daily as needed for anxiety.     . docusate sodium (COLACE) 100 MG capsule Take 100 mg by mouth 2 (two) times daily.    . Lactobacillus (PROBIOTIC ACIDOPHILUS PO) Take by mouth.    . metoprolol  tartrate (LOPRESSOR) 50 MG tablet TAKE 1 TABLET BY MOUTH TWICE DAILY 180 tablet 1  . Multiple Vitamins-Minerals (PRESERVISION/LUTEIN) CAPS Take 1 capsule by mouth 2 (two) times daily.     . nitroGLYCERIN (NITROSTAT) 0.4 MG SL tablet Place 1 tablet (0.4 mg total) under the tongue every 5 (five) minutes as needed for chest pain. MORE THAN 2 DOSES GO TO ED 25 tablet 3  . rosuvastatin (CRESTOR) 20 MG tablet Take 1 tablet (20 mg total) by mouth at bedtime. 90 tablet 3  . pantoprazole (PROTONIX) 40 MG tablet Take 40 mg by mouth daily. (Patient not taking: Reported on 09/05/2020)    . polyethylene glycol powder (GLYCOLAX/MIRALAX) 17 GM/SCOOP powder Take 17 g by mouth 2 (two) times a week. As needed (Patient not taking: Reported on 09/05/2020)     No current facility-administered medications for this visit.    Allergies as of 09/05/2020 - Review Complete 09/05/2020  Allergen Reaction Noted  . Cefixime Shortness Of Breath 09/14/2008  . Conjugated estrogens Other (See Comments) 09/14/2008  . Morphine and related Other (See Comments) 09/13/2011  . Codeine Other (See Comments) 09/14/2008  . Iohexol  09/30/2008  . Adenosine Other (See Comments) 09/13/2011    Family History  Problem Relation Age of Onset  . Heart failure Mother   . Coronary artery disease Mother   . Heart disease Mother   . Hypertension Mother   . Stroke Mother   . Heart attack Father   . Heart disease Father   . Hypertension Father   . Heart disease Brother   . Hypertension Sister   . Parkinson's disease Sister   . Kidney disease Sister   . Stroke Sister   . Hypertension Brother   . Stroke Sister   . Hypertension Sister   . Breast cancer Neg Hx     Social History   Socioeconomic History  . Marital status: Widowed    Spouse name: Not on file  . Number of children: 1  . Years of education: 16  . Highest education level: Not on file  Occupational History  . Occupation: Retired    Comment: Scientist, clinical (histocompatibility and immunogenetics) at [employer]  . Smoking status: Former Smoker    Packs/day: 0.20    Years: 1.00    Pack years: 0.20    Types: Cigarettes    Quit date: 11/13/1979    Years since quitting: 40.8  . Smokeless tobacco: Never Used  . Tobacco comment: "smoked a few months"  Vaping Use  . Vaping Use: Never used  Substance and Sexual Activity  . Alcohol use: No    Alcohol/week: 0.0 standard drinks  . Drug use: Never  . Sexual activity: Not on file  Other Topics Concern  . Not on file  Social History Narrative   Drinks 1/2 caf coffee 3-4 cups a day    Social Determinants of Health   Financial Resource Strain:   . Difficulty of Paying Living Expenses: Not on file  Food Insecurity:   . Worried About Charity fundraiser in the Last Year: Not on file  . Ran Out of Food in the Last Year: Not on file  Transportation Needs:   . Lack of Transportation (Medical): Not on file  . Lack of Transportation (Non-Medical): Not on file  Physical Activity:   . Days of Exercise per Week: Not on file  . Minutes of Exercise per Session: Not on file  Stress:   . Feeling of Stress : Not on file  Social Connections:   . Frequency of Communication with Friends and Family: Not on file  . Frequency of Social Gatherings with Friends and Family: Not on file  . Attends Religious Services: Not on file  . Active Member of Clubs or Organizations: Not on file  . Attends Archivist Meetings: Not on file  . Marital Status: Not on file  Intimate Partner Violence:   . Fear of Current or Ex-Partner: Not on file  . Emotionally Abused: Not on file  . Physically Abused: Not on file  . Sexually Abused: Not on file     Physical Exam: BP 122/80   Pulse 74   Ht 5' 1.5" (1.562 m)   Wt 112 lb (50.8 kg)   SpO2 94%   BMI 20.82 kg/m  Constitutional: generally well-appearing Psychiatric: alert and oriented x3 Eyes: extraocular movements intact Mouth: oral pharynx moist, no lesions Neck: supple no  lymphadenopathy Cardiovascular: heart regular rate and rhythm Lungs: clear to auscultation bilaterally Abdomen: soft, nontender, nondistended, no obvious ascites, no peritoneal signs, normal bowel sounds Extremities: no lower extremity edema bilaterally Skin: no lesions on visible extremities   Assessment and plan: 84 y.o. female with intermittent abdominal pains, colon mass in right colon  The mass is symptomatic, intermittently causing volvulus.  These must be partial and intermittent because she certainly has not had anything as dramatic as a bowel obstruction would be.  She understands that she needs colonoscopy and likely will need right colon resection as well.  She was in her cardiology office late last week and the provider that saw her did not think she was clear for colonoscopy without further testing.  I had a very nice discussion with Dr. Johnny Bridge who knows her quite well from a cardiac perspective.  He wants to see her himself in person in the office before making a final decision about further cardiac testing versus going ahead with colonoscopy and treating things medically from a cardiac perspective.  We are going to plan and pick a day for colonoscopy about 3 or 4 weeks from now.  I think that should give ample time for cardiac decision to be made.  If not then we can always push it out another week or 2.  Please see the "Patient Instructions" section for addition details about the plan.   Owens Loffler, MD [facility] 09/05/2020, 3:29 PM  Cc: Glenda Chroman, MD  Total time on date of encounter was 45  minutes (this included time spent preparing to see the patient reviewing records; obtaining and/or reviewing separately obtained history; performing a medically appropriate exam and/or evaluation; counseling and educating the patient and family if present; ordering medications, tests or procedures if applicable; and documenting clinical information in the health  record).

## 2020-09-06 NOTE — Progress Notes (Signed)
Cardiology Office Note  Date: 09/07/2020   ID: [name], DOB 1936/06/08, MRN [id number]  PCP:  Glenda Chroman, MD  Cardiologist:  Rozann Lesches, MD Electrophysiologist:  None   Chief Complaint  Patient presents with  . Cardiac follow-up    History of Present Illness: [name] is an 84 y.o. female seen recently in clinic by Mr. Leonides Sake NP, I reviewed the note.  She is being considered for colonoscopy and most likely abdominal surgery given abnormal abdominal/pelvic CT done recently in September at [facility] concerning for colonic mass/malignancy.  She was not cleared to proceed by Mr. Leonides Sake NP, patient reporting intermittent chest pain and palpitations.  A Zio patch was ordered.  I spoke with Dr. Ardis Hughs with GI on the phone yesterday regarding the patient's situation.  I had concerns about deferring gastroenterology evaluation any unusual length of time given the potential diagnosis, and asked that the patient come in to see me to discuss her symptoms in more detail.  She tells me that approximately 2 weeks ago she had been doing some chores around her house when she suddenly developed a rapid sense of palpitations associated with chest pain.  She rested and symptoms went away in about 30 minutes.  This is the only time that this is occurred.  Otherwise, she does report intermittent exertional angina depending on level of activity.  She has not used nitroglycerin.  She underwent CABG in April 2021 with LIMA to the LAD, SVG to the RCA, and SVG to the first diagonal.  I reviewed her medications today.  We discussed adjustments in therapy and also follow-up ischemic evaluation mainly to reassess degree of ischemic burden as this may help to guide more definitive risk estimation with upcoming procedures.   Past Medical History:  Diagnosis Date  . Anemia   . Anxiety   . Arthritis   . CAD (coronary artery disease)    Status post CABG April 2021 - Dr. Servando Snare  . Chronic  constipation   . Claudication [facility])    Chronic occlusion of the right limb of aortic stent graft.  . Diverticulosis    Left sided noted on colonoscopy 02/2006  . Ectopic atrial tachycardia ([place])   . Esophageal reflux disease   . Essential hypertension   . Hiatal hernia   . History of TIA (transient ischemic attack)   . Hyperlipidemia   . Lumbar radiculopathy   . Macular degeneration   . Migraines   . Penetrating atherosclerotic ulcer of aorta (HCC)    Complex ulcerated plaque of the infrarenal abdominal aorta mild renal artery stenosis on the right side, normal left renal artery catheterization 2009, status post aortic stent graft  . Renal artery stenosis ([place])    a. Right renal artery stent April 2013 - Dr. Fletcher Anon;  b. 03/2014 PTA/stenting of RRA 2/2 ISR: 6x18 Herculink stent.  . Trigeminal neuralgia    Right  . Type 2 diabetes mellitus ([place])     Past Surgical History:  Procedure Laterality Date  . ABDOMINAL AORTAGRAM N/A 02/13/2012   Procedure: ABDOMINAL Maxcine Ham;  Surgeon: Wellington Hampshire, MD;  Location: [place] CATH LAB;  Service: Cardiovascular;  Laterality: N/A;  . ABDOMINAL AORTAGRAM N/A 03/17/2014   Procedure: ABDOMINAL Maxcine Ham;  Surgeon: Wellington Hampshire, MD;  Location: [place] CATH LAB;  Service: Cardiovascular;  Laterality: N/A;  . ABDOMINAL HYSTERECTOMY  1982  . BREAST BIOPSY Left 1990's  . CAROTID ENDARTERECTOMY Left 1993  . COLONOSCOPY    .  CORONARY ARTERY BYPASS GRAFT N/A 03/02/2020   Procedure: CORONARY ARTERY BYPASS GRAFTING (CABG)x 3, LIMA TO LAD, SVG TO DIAGONAL, SVG TO RCA, WITH OPEN HARVESTING OF LEFT LOWER GREATER SAPHENOUS VEIN AND RIGHT AND LEFT GREATER SAPHENOUS VEIN HARVESTED ENDOSCOPICALLY;  Surgeon: Grace Isaac, MD;  Location: [place];  Service: Open Heart Surgery;  Laterality: N/A;  . Endologix powerlink bifurcated     Covered stent graft  . ENDOVEIN HARVEST OF GREATER SAPHENOUS VEIN Bilateral 03/02/2020   Procedure: [place] Of Greater Saphenous Vein;   Surgeon: Grace Isaac, MD;  Location: [place];  Service: Open Heart Surgery;  Laterality: Bilateral;  . LEFT HEART CATH AND CORONARY ANGIOGRAPHY N/A 01/28/2020   Procedure: LEFT HEART CATH AND CORONARY ANGIOGRAPHY;  Surgeon: Nelva Bush, MD;  Location: [place] CV LAB;  Service: Cardiovascular;  Laterality: N/A;  . LEFT HEART CATHETERIZATION WITH CORONARY ANGIOGRAM N/A 03/17/2014   Procedure: LEFT HEART CATHETERIZATION WITH CORONARY ANGIOGRAM;  Surgeon: Wellington Hampshire, MD;  Location: [place] CATH LAB;  Service: Cardiovascular;  Laterality: N/A;  . LUMBAR [place]  . PERCUTANEOUS STENT INTERVENTION Right 03/17/2014   Procedure: PERCUTANEOUS STENT INTERVENTION;  Surgeon: Wellington Hampshire, MD;  Location: [place] CATH LAB;  Service: Cardiovascular;  Laterality: Right;  Renal  . RENAL ANGIOGRAM N/A 02/13/2012   Procedure: RENAL ANGIOGRAM;  Surgeon: Wellington Hampshire, MD;  Location: [place] CATH LAB;  Service: Cardiovascular;  Laterality: N/A;  . RENAL ANGIOGRAM N/A 03/17/2014   Procedure: RENAL ANGIOGRAM;  Surgeon: Wellington Hampshire, MD;  Location: [place] CATH LAB;  Service: Cardiovascular;  Laterality: N/A;  . TEE WITHOUT CARDIOVERSION N/A 03/02/2020   Procedure: TRANSESOPHAGEAL ECHOCARDIOGRAM (TEE);  Surgeon: Grace Isaac, MD;  Location: [place];  Service: Open Heart Surgery;  Laterality: N/A;  . TONSILLECTOMY AND ADENOIDECTOMY  1947    Current Outpatient Medications  Medication Sig Dispense Refill  . acetaminophen (TYLENOL) 500 MG tablet Take 500 mg by mouth every 6 (six) hours as needed.    Marland Kitchen amLODipine (NORVASC) 5 MG tablet Take 1 tablet (5 mg total) by mouth daily. 30 tablet 1  . aspirin EC 325 MG EC tablet Take 1 tablet (325 mg total) by mouth daily. 30 tablet 0  . Cholecalciferol (VITAMIN D3) 1000 UNITS CAPS Take 1,000 Units by mouth daily.     . clonazePAM (KLONOPIN) 0.5 MG tablet Take 0.25 mg by mouth 2 (two) times daily as needed for anxiety.     . docusate sodium (COLACE) 100 MG capsule  Take 100 mg by mouth 2 (two) times daily.    . Lactobacillus (PROBIOTIC ACIDOPHILUS PO) Take by mouth.    . metoprolol tartrate (LOPRESSOR) 50 MG tablet Take 1.5 tablets (75 mg total) by mouth 2 (two) times daily. 270 tablet 1  . Multiple Vitamins-Minerals (PRESERVISION/LUTEIN) CAPS Take 1 capsule by mouth 2 (two) times daily.     . Na Sulfate-K Sulfate-Mg Sulf (SUPREP BOWEL PREP KIT) 17.5-3.13-1.6 GM/177ML SOLN Take 1 kit by mouth as directed. 324 mL 0  . nitroGLYCERIN (NITROSTAT) 0.4 MG SL tablet Place 1 tablet (0.4 mg total) under the tongue every 5 (five) minutes as needed for chest pain. MORE THAN 2 DOSES GO TO ED 25 tablet 3  . pantoprazole (PROTONIX) 40 MG tablet Take 40 mg by mouth daily.     . polyethylene glycol powder (GLYCOLAX/MIRALAX) 17 GM/SCOOP powder Take 17 g by mouth 2 (two) times a week. As needed    . rosuvastatin (CRESTOR) 20  MG tablet Take 1 tablet (20 mg total) by mouth at bedtime. 90 tablet 3  . isosorbide mononitrate (IMDUR) 30 MG 24 hr tablet Take 0.5 tablets (15 mg total) by mouth daily. 45 tablet 1   No current facility-administered medications for this visit.   Allergies:  Cefixime, Conjugated estrogens, Morphine and related, Codeine, Iohexol, and Adenosine   ROS: No syncope.  Physical Exam: VS:  BP (!) 150/82   Pulse 100   Ht 5' 1.5" (1.562 m)   Wt 112 lb (50.8 kg)   SpO2 94%   BMI 20.82 kg/m , BMI Body mass index is 20.82 kg/m.  Wt Readings from Last 3 Encounters:  09/07/20 112 lb (50.8 kg)  09/05/20 112 lb (50.8 kg)  09/02/20 112 lb (50.8 kg)    General: Elderly woman. Appears comfortable at rest. HEENT: Conjunctiva and lids normal, wearing a mask. Neck: Supple, no elevated JVP or carotid bruits, no thyromegaly. Lungs: Clear to auscultation, nonlabored breathing at rest. Cardiac: Regular rate and rhythm, no S3, soft systolic murmur, no pericardial rub. Extremities: No pitting edema, distal pulses 2+.  ECG:  An ECG dated 03/09/2020 was personally  reviewed today and demonstrated:  Sinus rhythm with diffuse ST-T wave abnormalities, global subendocardial ischemia not excluded.  Recent Labwork: 03/03/2020: Magnesium 2.5 03/05/2020: ALT 25; AST 24 03/07/2020: BUN 16; Creatinine, Ser 0.78; Hemoglobin 12.0; Platelets 234; Potassium 3.9; Sodium 137     Component Value Date/Time   CHOL 130 01/29/2020 0202   TRIG 105 01/29/2020 0202   HDL 38 (L) 01/29/2020 0202   CHOLHDL 3.4 01/29/2020 0202   VLDL 21 01/29/2020 0202   LDLCALC 71 01/29/2020 0202    Other Studies Reviewed Today:  Echocardiogram 03/01/2020: 1. Left ventricular ejection fraction, by estimation, is 55 to 60%. The  left ventricle has normal function. The left ventricle has no regional  wall motion abnormalities. There is mild left ventricular hypertrophy of  the basal-septal segment. Left  ventricular diastolic parameters are indeterminate.  2. Right ventricular systolic function is normal. The right ventricular  size is normal. There is normal pulmonary artery systolic pressure.  3. The mitral valve is normal in structure. Mild mitral valve  regurgitation. No evidence of mitral stenosis.  4. The aortic valve is tricuspid. Aortic valve regurgitation is not  visualized. Mild to moderate aortic valve sclerosis/calcification is  present, without any evidence of aortic stenosis.  5. The inferior vena cava is normal in size with greater than 50%  respiratory variability, suggesting right atrial pressure of 3 mmHg.   Abdominal and pelvic CT 08/10/2020: FINDINGS:  Lower chest: Heart is mildly enlarged. Aortic atherosclerosis. No  acute abnormality.   Hepatobiliary: No focal hepatic abnormality. Gallbladder  unremarkable.   Pancreas: No focal abnormality or ductal dilatation.   Spleen: No focal abnormality. Normal size.   Adrenals/Urinary Tract: Small scattered hypodensitiesin the kidneys  compatible with small cysts. No hydronephrosis. Adrenal glands and  urinary  bladder unremarkable.   Stomach/Bowel: Large stool burden throughout the colon. There is a  right colonic intussusception noted. There appears to be wall  thickening in this area. Abnormal adjacent mesenteric lymph nodes  are noted. Appearance is concerning for possible colonic mass with  associated colo colonic intussusception. Stomach and small bowel  decompressed, unremarkable. Appendix is normal.   Vascular/Lymphatic: Heavily calcified aorta and branch vessels.  Stent graft seen in the distal aorta extending into the common iliac  arteries. The right common iliac limb appears occluded. As noted  above.  There are prominent mesenteric lymph nodes adjacent to the  right: Measuring up to 11 mm short axis diameter.   Reproductive: Uterus and adnexa unremarkable. No mass.   Other: No free fluid or free air.   Musculoskeletal: No acute bony abnormality.   IMPRESSION:  Abnormal appearance of the right colon. There is a right colo  colonic intussusception. Wall appears thickened, and there are  abnormally enlarged adjacent right mesenteric lymph nodes.  Appearance is concerning for possible colonic mass/malignancy with  associated intussusception and mesenteric adenopathy. No evidence of obstruction.   Large stool burden throughout the colon.   Aortoiliac atherosclerosis. Stent graft in place with apparent  occlusion of the right common iliac limb.   Assessment and Plan:  1.  Preprocedure cardiac evaluation in an 84 year old woman with CAD status post CABG in April of this year, hypertension, PAD, hyperlipidemia, atrial tachycardia, and postoperative atrial fibrillation.  She has a recently documented right colonic mass with intussusception by CT imaging that is concerning for malignancy and she is being considered for diagnostic colonoscopy and most likely abdominal surgery under general anesthesia.  She has had exertional angina symptoms on medical therapy, also an episode of  palpitations described above.  We have discussed the situation and reviewed her medications in detail today.  Plan is to increase Lopressor to 75 mg twice daily, add Imdur beginning at 15 mg daily.  Continue aspirin, Norvasc, and Crestor at present doses.  We will schedule her for a Lexiscan Myoview to evaluate overall ischemic burden.  Unless there are high risk features, I anticipate that she should be able to proceed with colonoscopy as a next step and then onto abdominal surgery if necessary at overall intermediate perioperative cardiac risk.  If she has high risk features, this will have to be more carefully considered, although putting her through a cardiac catheterization and potential PCI at this point would require dual antiplatelet therapy and put off further diagnostic assessment of her colon for a minimum of 6 months which is clearly not optimal in the case of suspected malignancy.  2.  Episode of palpitations as discussed above.  She does have a history of atrial tachycardia and also postoperative atrial fibrillation.  She does not report any recurring symptoms at this point, would continue observation on increased dose of beta-blocker.  Hold off on further monitoring for now unless symptoms escalate.  Medication Adjustments/Labs and Tests Ordered: Current medicines are reviewed at length with the patient today.  Concerns regarding medicines are outlined above.   Tests Ordered: Orders Placed This Encounter  Procedures  . NM Myocar Multi W/Spect W/Wall Motion / EF    Medication Changes: Meds ordered this encounter  Medications  . isosorbide mononitrate (IMDUR) 30 MG 24 hr tablet    Sig: Take 0.5 tablets (15 mg total) by mouth daily.    Dispense:  45 tablet    Refill:  1    09/07/2020 NEW  . metoprolol tartrate (LOPRESSOR) 50 MG tablet    Sig: Take 1.5 tablets (75 mg total) by mouth 2 (two) times daily.    Dispense:  270 tablet    Refill:  1    09/07/2020 dose increase     Disposition:  Follow up test results with further recommendations.  Signed, [name], MD, [facility] 09/07/2020 10:21 AM    [facility] [place], [place], [place] [address] Phone: [phone]; Fax: [phone]

## 2020-09-07 ENCOUNTER — Encounter: Payer: Self-pay | Admitting: Cardiology

## 2020-09-07 ENCOUNTER — Encounter: Payer: Self-pay | Admitting: *Deleted

## 2020-09-07 ENCOUNTER — Ambulatory Visit (INDEPENDENT_AMBULATORY_CARE_PROVIDER_SITE_OTHER): Payer: Medicare Other | Admitting: Cardiology

## 2020-09-07 VITALS — BP 150/82 | HR 100 | Ht 61.5 in | Wt 112.0 lb

## 2020-09-07 DIAGNOSIS — Z01810 Encounter for preprocedural cardiovascular examination: Secondary | ICD-10-CM | POA: Diagnosis not present

## 2020-09-07 DIAGNOSIS — I25119 Atherosclerotic heart disease of native coronary artery with unspecified angina pectoris: Secondary | ICD-10-CM | POA: Diagnosis not present

## 2020-09-07 DIAGNOSIS — Z8719 Personal history of other diseases of the digestive system: Secondary | ICD-10-CM | POA: Diagnosis not present

## 2020-09-07 DIAGNOSIS — R002 Palpitations: Secondary | ICD-10-CM | POA: Diagnosis not present

## 2020-09-07 DIAGNOSIS — Q676 Pectus excavatum: Secondary | ICD-10-CM | POA: Diagnosis not present

## 2020-09-07 DIAGNOSIS — I700 Atherosclerosis of aorta: Secondary | ICD-10-CM | POA: Diagnosis not present

## 2020-09-07 DIAGNOSIS — K6389 Other specified diseases of intestine: Secondary | ICD-10-CM | POA: Diagnosis not present

## 2020-09-07 DIAGNOSIS — I200 Unstable angina: Secondary | ICD-10-CM | POA: Diagnosis not present

## 2020-09-07 DIAGNOSIS — R9389 Abnormal findings on diagnostic imaging of other specified body structures: Secondary | ICD-10-CM | POA: Diagnosis not present

## 2020-09-07 MED ORDER — METOPROLOL TARTRATE 50 MG PO TABS: 75.0000 mg | ORAL_TABLET | Freq: Two times a day (BID) | ORAL | 1 refills | Status: DC

## 2020-09-07 MED ORDER — ISOSORBIDE MONONITRATE ER 30 MG PO TB24
15.0000 mg | ORAL_TABLET | Freq: Every day | ORAL | 1 refills | Status: DC
Start: 2020-09-07 — End: 2021-03-03

## 2020-09-07 NOTE — Patient Instructions (Addendum)
Medication Instructions:   Your physician has recommended you make the following change in your medication:   Increase metoprolol tartrate to 75 mg by mouth twice daily  Start isosorbide mononitrate 15 mg by mouth daily in the evening  Continue other medications the same  Labwork:  [place]  Testing/Procedures:  Your physician has requested that you have a lexiscan myoview. For further information please visit [email or web address]. Please follow instruction sheet, as given. Your heart monitor (ZIO) has been canceled. Please mail back to company.  Follow-Up:  Your physician recommends that you schedule a follow-up appointment in: as planned-may change  Any Other Special Instructions Will Be Listed Below (If Applicable).  If you need a refill on your cardiac medications before your next appointment, please call your pharmacy.

## 2020-09-08 ENCOUNTER — Telehealth: Payer: Self-pay | Admitting: *Deleted

## 2020-09-08 MED ORDER — METOPROLOL TARTRATE 50 MG PO TABS: 50.0000 mg | ORAL_TABLET | Freq: Two times a day (BID) | ORAL | 1 refills | Status: DC

## 2020-09-08 NOTE — Telephone Encounter (Signed)
Patient informed and verbalized understanding of plan. 

## 2020-09-08 NOTE — Telephone Encounter (Signed)
Reports feeling like a zombie and worn down after taking 75 mg lopressor last night. Reports taking lopressor 50 mg this morning. Reports that she has not started imdur 15 mg. Reports having a headache today and says it feels different from her usual headache rated 8/10. Took tylenol and says it helped some. 09/08/2020 before medications, BP 142/82 & HR not checked. After medications this morning, BP 109/60 & HR 68. At 11:50 am, BP 126/64 & HR 70. Denies worsening chest pain or sob. Advised that message would be sent to provider for recommendations and if symptoms get worse, to go to the ED for an evaluation.

## 2020-09-08 NOTE — Telephone Encounter (Signed)
For now we can go back to the original dose of Lopressor.  I would see if she could take the Imdur 15 mg once in the evening as an antianginal.  I see that she is set up for stress testing tomorrow and hopefully this will provide [place] more information.

## 2020-09-09 ENCOUNTER — Encounter (HOSPITAL_COMMUNITY): Payer: Self-pay

## 2020-09-09 ENCOUNTER — Other Ambulatory Visit: Payer: Self-pay

## 2020-09-09 ENCOUNTER — Encounter (HOSPITAL_BASED_OUTPATIENT_CLINIC_OR_DEPARTMENT_OTHER)
Admission: RE | Admit: 2020-09-09 | Discharge: 2020-09-09 | Disposition: A | Discharge status: Home / Self Care | Payer: Medicare Other | Source: Ambulatory Visit | Attending: Cardiology | Admitting: Cardiology

## 2020-09-09 ENCOUNTER — Encounter (HOSPITAL_COMMUNITY)
Admission: RE | Admit: 2020-09-09 | Discharge: 2020-09-09 | Disposition: A | Discharge status: Home / Self Care | Payer: Medicare Other | Source: Ambulatory Visit | Attending: Cardiology | Admitting: Cardiology

## 2020-09-09 DIAGNOSIS — I25119 Atherosclerotic heart disease of native coronary artery with unspecified angina pectoris: Secondary | ICD-10-CM | POA: Insufficient documentation

## 2020-09-09 DIAGNOSIS — Z01810 Encounter for preprocedural cardiovascular examination: Secondary | ICD-10-CM | POA: Insufficient documentation

## 2020-09-09 LAB — NM MYOCAR MULTI W/SPECT W/WALL MOTION / EF
LV dias vol: 64 mL (ref 46–106)
LV sys vol: 20 mL
Peak HR: 113 {beats}/min
RATE: 0.42
Rest HR: 74 {beats}/min
SDS: 0
SRS: 1
SSS: 1
TID: 0.89

## 2020-09-09 MED ORDER — SODIUM CHLORIDE FLUSH 0.9 % IV SOLN
INTRAVENOUS | Status: AC
  Administered 2020-09-09: 10 mL via INTRAVENOUS
  Filled 2020-09-09: qty 10

## 2020-09-09 MED ORDER — TECHNETIUM TC 99M TETROFOSMIN IV KIT
30.0000 | PACK | Freq: Once | INTRAVENOUS | Status: AC | PRN
  Administered 2020-09-09: 33 via INTRAVENOUS

## 2020-09-09 MED ORDER — TECHNETIUM TC 99M TETROFOSMIN IV KIT
10.0000 | PACK | Freq: Once | INTRAVENOUS | Status: AC | PRN
  Administered 2020-09-09: 11 via INTRAVENOUS

## 2020-09-09 MED ORDER — REGADENOSON 0.4 MG/5ML IV SOLN
INTRAVENOUS | Status: AC
  Administered 2020-09-09: 0.4 mg via INTRAVENOUS
  Filled 2020-09-09: qty 5

## 2020-09-10 DIAGNOSIS — I10 Essential (primary) hypertension: Secondary | ICD-10-CM | POA: Diagnosis not present

## 2020-09-12 ENCOUNTER — Other Ambulatory Visit (HOSPITAL_COMMUNITY): Payer: Medicare Other

## 2020-09-12 ENCOUNTER — Telehealth: Payer: Self-pay | Admitting: *Deleted

## 2020-09-12 ENCOUNTER — Other Ambulatory Visit: Payer: Self-pay

## 2020-09-12 DIAGNOSIS — K6389 Other specified diseases of intestine: Secondary | ICD-10-CM

## 2020-09-12 NOTE — Telephone Encounter (Signed)
-----   Message from [name], MD sent at 09/11/2020  7:55 PM EDT ----- Results reviewed.  Please let her know that the stress test was overall low risk, small inferior wall scar but no major ischemic territories and ejection fraction normal.  Although I do suspect she is having angina symptoms, would continue medical therapy, Imdur was recently added, she did not tolerate up titration of beta-blocker.  She should be able to proceed with planned colonoscopy at overall intermediate periprocedural cardiac risk.  Important to pursue diagnosis of colon mass given suspected malignancy, to better understand management strategies and whether or not she may need to have subsequent surgery.

## 2020-09-12 NOTE — Telephone Encounter (Signed)
Pt voiced understanding

## 2020-09-13 ENCOUNTER — Other Ambulatory Visit (HOSPITAL_COMMUNITY)
Admission: RE | Admit: 2020-09-13 | Discharge: 2020-09-13 | Disposition: A | Discharge status: Home / Self Care | Payer: Medicare Other | Source: Ambulatory Visit | Attending: Gastroenterology | Admitting: Gastroenterology

## 2020-09-13 ENCOUNTER — Telehealth: Payer: Self-pay | Admitting: Gastroenterology

## 2020-09-13 DIAGNOSIS — Z20822 Contact with and (suspected) exposure to covid-19: Secondary | ICD-10-CM | POA: Diagnosis not present

## 2020-09-13 DIAGNOSIS — Z01812 Encounter for preprocedural laboratory examination: Secondary | ICD-10-CM | POA: Diagnosis not present

## 2020-09-13 LAB — SARS CORONAVIRUS 2 (TAT 6-24 HRS): SARS Coronavirus 2: NEGATIVE

## 2020-09-13 NOTE — Telephone Encounter (Signed)
The pt asked if she needed to stop ASA prior to upcoming procedure. She has been advised that she can continue to take as prescribed. She will call back with any further questions.

## 2020-09-15 ENCOUNTER — Encounter (HOSPITAL_COMMUNITY)
Admission: RE | Disposition: A | Discharge status: Home / Self Care | Payer: Self-pay | Source: Home / Self Care | Attending: Gastroenterology

## 2020-09-15 ENCOUNTER — Other Ambulatory Visit: Payer: Self-pay

## 2020-09-15 ENCOUNTER — Encounter (HOSPITAL_COMMUNITY): Payer: Self-pay | Admitting: Gastroenterology

## 2020-09-15 ENCOUNTER — Telehealth: Payer: Self-pay | Admitting: Gastroenterology

## 2020-09-15 ENCOUNTER — Ambulatory Visit (HOSPITAL_COMMUNITY): Payer: Medicare Other | Admitting: Certified Registered Nurse Anesthetist

## 2020-09-15 ENCOUNTER — Ambulatory Visit (HOSPITAL_COMMUNITY)
Admission: RE | Admit: 2020-09-15 | Discharge: 2020-09-15 | Disposition: A | Discharge status: Home / Self Care | Payer: Medicare Other | Attending: Gastroenterology | Admitting: Gastroenterology

## 2020-09-15 DIAGNOSIS — K648 Other hemorrhoids: Secondary | ICD-10-CM | POA: Diagnosis not present

## 2020-09-15 DIAGNOSIS — K561 Intussusception: Secondary | ICD-10-CM | POA: Insufficient documentation

## 2020-09-15 DIAGNOSIS — I10 Essential (primary) hypertension: Secondary | ICD-10-CM | POA: Diagnosis not present

## 2020-09-15 DIAGNOSIS — K5730 Diverticulosis of large intestine without perforation or abscess without bleeding: Secondary | ICD-10-CM | POA: Diagnosis not present

## 2020-09-15 DIAGNOSIS — K644 Residual hemorrhoidal skin tags: Secondary | ICD-10-CM | POA: Insufficient documentation

## 2020-09-15 DIAGNOSIS — K5909 Other constipation: Secondary | ICD-10-CM | POA: Insufficient documentation

## 2020-09-15 DIAGNOSIS — C184 Malignant neoplasm of transverse colon: Secondary | ICD-10-CM

## 2020-09-15 DIAGNOSIS — I2510 Atherosclerotic heart disease of native coronary artery without angina pectoris: Secondary | ICD-10-CM | POA: Diagnosis not present

## 2020-09-15 DIAGNOSIS — Z951 Presence of aortocoronary bypass graft: Secondary | ICD-10-CM | POA: Diagnosis not present

## 2020-09-15 DIAGNOSIS — Z87891 Personal history of nicotine dependence: Secondary | ICD-10-CM | POA: Insufficient documentation

## 2020-09-15 DIAGNOSIS — Z8673 Personal history of transient ischemic attack (TIA), and cerebral infarction without residual deficits: Secondary | ICD-10-CM | POA: Diagnosis not present

## 2020-09-15 DIAGNOSIS — C188 Malignant neoplasm of overlapping sites of colon: Secondary | ICD-10-CM | POA: Insufficient documentation

## 2020-09-15 DIAGNOSIS — C182 Malignant neoplasm of ascending colon: Secondary | ICD-10-CM | POA: Diagnosis not present

## 2020-09-15 DIAGNOSIS — K5669 Other partial intestinal obstruction: Secondary | ICD-10-CM

## 2020-09-15 DIAGNOSIS — K6389 Other specified diseases of intestine: Secondary | ICD-10-CM

## 2020-09-15 HISTORY — PX: SCLEROTHERAPY: SHX6841

## 2020-09-15 HISTORY — PX: COLONOSCOPY WITH PROPOFOL: SHX5780

## 2020-09-15 HISTORY — PX: BIOPSY: SHX5522

## 2020-09-15 SURGERY — COLONOSCOPY WITH PROPOFOL
Anesthesia: Monitor Anesthesia Care

## 2020-09-15 MED ORDER — SODIUM CHLORIDE 0.9 % IV SOLN: INTRAVENOUS | Status: DC

## 2020-09-15 MED ORDER — LACTATED RINGERS IV SOLN: INTRAVENOUS | Status: DC

## 2020-09-15 MED ORDER — PROPOFOL 500 MG/50ML IV EMUL
INTRAVENOUS | Status: DC | PRN
  Administered 2020-09-15 (×2): 25 mg via INTRAVENOUS

## 2020-09-15 MED ORDER — PROPOFOL 500 MG/50ML IV EMUL
INTRAVENOUS | Status: DC | PRN
  Administered 2020-09-15: 100 ug/kg/min via INTRAVENOUS

## 2020-09-15 MED ORDER — SPOT INK MARKER SYRINGE KIT
PACK | SUBMUCOSAL | Status: DC | PRN
  Administered 2020-09-15: 4 mL via SUBMUCOSAL

## 2020-09-15 MED ORDER — SPOT INK MARKER SYRINGE KIT
PACK | SUBMUCOSAL | Status: AC
  Filled 2020-09-15: qty 5

## 2020-09-15 MED ORDER — PROPOFOL 1000 MG/100ML IV EMUL
INTRAVENOUS | Status: AC
  Filled 2020-09-15: qty 100

## 2020-09-15 SURGICAL SUPPLY — 21 items

## 2020-09-15 NOTE — Anesthesia Postprocedure Evaluation (Signed)
Anesthesia Post Note  Patient: [name]  Procedure(s) Performed: COLONOSCOPY WITH PROPOFOL (N/A ) BIOPSY SCLEROTHERAPY     Patient location during evaluation: PACU Anesthesia Type: MAC Level of consciousness: awake and alert Pain management: pain level controlled Vital Signs Assessment: post-procedure vital signs reviewed and stable Respiratory status: spontaneous breathing, nonlabored ventilation, respiratory function stable and patient connected to nasal cannula oxygen Cardiovascular status: stable and blood pressure returned to baseline Postop Assessment: no apparent nausea or vomiting Anesthetic complications: no   No complications documented.  Last Vitals:  Vitals:   09/15/20 0820 09/15/20 0830  BP: (!) 149/71 (!) 144/59  Pulse: 84 74  Resp: 18 13  Temp:    SpO2: 98% 98%    Last Pain:  Vitals:   09/15/20 0830  TempSrc:   PainSc: 0-No pain                 Genelda Roark DAVID

## 2020-09-15 NOTE — Anesthesia Preprocedure Evaluation (Signed)
Anesthesia Evaluation  Patient identified by MRN, date of birth, ID band Patient awake    Reviewed: Allergy & Precautions, NPO status , Patient's Chart, lab work & pertinent test results  Airway Mallampati: I  TM Distance: >3 FB Neck ROM: Full    Dental   Pulmonary former smoker,    Pulmonary exam normal        Cardiovascular hypertension, Pt. on medications + CAD and + CABG  Normal cardiovascular exam     Neuro/Psych Anxiety    GI/Hepatic GERD  Medicated and Controlled,  Endo/Other  diabetes, Type 2  Renal/GU      Musculoskeletal   Abdominal   Peds  Hematology   Anesthesia Other Findings   Reproductive/Obstetrics                             Anesthesia Physical Anesthesia Plan  ASA: III  Anesthesia Plan: MAC   Post-op Pain Management:    Induction: Intravenous  PONV Risk Score and Plan: 2 and Treatment may vary due to age or medical condition  Airway Management Planned: Nasal Cannula  Additional Equipment:   Intra-op Plan:   Post-operative Plan:   Informed Consent: I have reviewed the patients History and Physical, chart, labs and discussed the procedure including the risks, benefits and alternatives for the proposed anesthesia with the patient or authorized representative who has indicated his/her understanding and acceptance.       Plan Discussed with: CRNA and Surgeon  Anesthesia Plan Comments:         Anesthesia Quick Evaluation

## 2020-09-15 NOTE — Interval H&P Note (Signed)
History and Physical Interval Note:  09/15/2020 7:04 AM  [name]  has presented today for surgery, with the diagnosis of colon mass.  The various methods of treatment have been discussed with the patient and family. After consideration of risks, benefits and other options for treatment, the patient has consented to  Procedure(s): COLONOSCOPY WITH PROPOFOL (N/A) as a surgical intervention.  The patient's history has been reviewed, patient examined, no change in status, stable for surgery.  I have reviewed the patient's chart and labs.  Questions were answered to the patient's satisfaction.     [name]

## 2020-09-15 NOTE — Telephone Encounter (Signed)
Colonoscopy report and images reviewed.  Obstructing mass which was nonreversible.  This was biopsied and tattoo placed.  No snare polypectomy performed.  Based on obstructive nature of this lesion and the patient's subsequent development of abdominal pain, chills, fever, tachycardia, I recommend going to the ER for expedited evaluation, to include imaging (likely CT), labs, exam.  I will CC Dr. Henrene Pastor who is on-call for our service tonight.

## 2020-09-15 NOTE — Telephone Encounter (Signed)
Pt's daughter is requesting a call back from a nurse to advise what to do, pt had a colonoscopy done at [facility] today but pt is experiencing a fever, called would like to know if this is normal and what she needs to do.

## 2020-09-15 NOTE — Discharge Instructions (Signed)

## 2020-09-15 NOTE — Transfer of Care (Signed)
Immediate Anesthesia Transfer of Care Note  Patient: [name]  Procedure(s) Performed: COLONOSCOPY WITH PROPOFOL (N/A ) BIOPSY SCLEROTHERAPY  Patient Location: PACU  Anesthesia Type:MAC  Level of Consciousness: sedated, patient cooperative and responds to stimulation  Airway & Oxygen Therapy: Patient Spontanous Breathing and Patient connected to face mask oxygen  Post-op Assessment: Report given to RN and Post -op Vital signs reviewed and stable  Post vital signs: Reviewed and stable  Last Vitals:  Vitals Value Taken Time  BP    Temp    Pulse 62 09/15/20 0808  Resp 17 09/15/20 0808  SpO2 100 % 09/15/20 0808  Vitals shown include unvalidated device data.  Last Pain:  Vitals:   09/15/20 0656  TempSrc: Oral  PainSc: 0-No pain         Complications: No complications documented.

## 2020-09-15 NOTE — Telephone Encounter (Signed)
I spoke with the pt's daughter and she states she will take the pt to [facility] ED for evaluation.

## 2020-09-15 NOTE — Telephone Encounter (Signed)
Dr Bryan Lemma you are DOD this afternoon and Dr Ardis Hughs is off can you please review?

## 2020-09-15 NOTE — Telephone Encounter (Signed)
Dr Ardis Hughs the pt had procedure at the hospital this morning and around 2:30 pm she began to feel "bad" with chills and abd pain temp of 102 F orally she took 650 mg of tylenol and her temp is now 100.6 F orally.  Her heart is  increased at 114.   She says this is not normal for her.  Please advise

## 2020-09-15 NOTE — Op Note (Signed)
[facility] Patient Name: [name] Procedure Date: 09/15/2020 MRN: [id number] Attending MD: [name] , MD Date of Birth: 16-Sep-1936 CSN: [id number] Age: 84 Admit Type: Outpatient Procedure:                Colonoscopy Indications:              right colon intussusception with possible colon                            tumor as lead point on recent CT scan Providers:                [name], MD, Angus Seller, Elspeth Cho Tech., Technician, Lesia Sago,                            Technician, Herbie Drape, CRNA Referring MD:             Annye English, MD Medicines:                Monitored Anesthesia Care Complications:            No immediate complications. Estimated blood loss:                            None. Estimated Blood Loss:     Estimated blood loss: none. Procedure:                Pre-Anesthesia Assessment:                           - Prior to the procedure, a History and Physical                            was performed, and patient medications and                            allergies were reviewed. The patient's tolerance of                            previous anesthesia was also reviewed. The risks                            and benefits of the procedure and the sedation                            options and risks were discussed with the patient.                            All questions were answered, and informed consent                            was obtained. Prior Anticoagulants: The patient has  taken no previous anticoagulant or antiplatelet                            agents. ASA Grade Assessment: IV - A patient with                            severe systemic disease that is a constant threat                            to life. After reviewing the risks and benefits,                            the patient was deemed in satisfactory condition to                             undergo the procedure.                           After obtaining informed consent, the colonoscope                            was passed under direct vision. Throughout the                            procedure, the patient's blood pressure, pulse, and                            oxygen saturations were monitored continuously. The                            [id number]-[id number] ([id number] Olympus pediatric colonscope                            was introduced through the anus and advanced to the                            the transverse colon. The colonoscopy was performed                            without difficulty. The patient tolerated the                            procedure well. The quality of the bowel                            preparation was good. The rectum was photographed. Scope In: 7:40:56 AM Scope Out: 8:00:35 AM Scope Withdrawal Time: 0 hours 15 minutes 5 seconds  Total Procedure Duration: 0 hours 19 minutes 39 seconds  Findings:      There was a large, spherical, ulcerated, partially necrotic tumor in the       transverse/right colon. This was clearly malignant. I was only able to       inspect the distal aspect of the tumor as I could not manuever the scope  more proximally however the tumor does not seem to be cifcumferential. I       biopsied the tumor extensively and then labeled the mucosa at the distal       edge with two submucosal injections of Spot.      Multiple small and large-mouthed diverticula were found in the left       colon.      External and internal hemorrhoids were found. The hemorrhoids were small.      The exam was otherwise without abnormality on direct and retroflexion       views.      This was an incomplete examination, see above. Impression:               - There was a large, spherical, ulcerated,                            partially necrotic tumor in the transverse/right                            colon. This was clearly malignant. I was only able                             to inspect the distal aspect of the tumor as I                            could not manuever the scope more proximally                            however the tumor does not seem to be                            cifcumferential. I biopsied the tumor extensively                            and then labeled the mucosa at the distal edge with                            two submucosal injections of Spot.                           - Diverticulosis in the left colon.                           - External and internal hemorrhoids.                           - The examination was otherwise normal on direct                            and retroflexion views. Moderate Sedation:      Not Applicable - Patient had care per Anesthesia. Recommendation:           - Patient has a contact number available for  emergencies. The signs and symptoms of potential                            delayed complications were discussed with the                            patient. Return to normal activities tomorrow.                            Written discharge instructions were provided to the                            patient.                           - Resume previous diet.                           - Continue present medications.                           - Await pathology results. Procedure Code(s):        --- Professional ---                           [phone], 103, Colonoscopy, flexible; with directed                            submucosal injection(s), any substance                           56433, 65, Colonoscopy, flexible; with biopsy,                            single or multiple Diagnosis Code(s):        --- Professional ---                           C18.4, Malignant neoplasm of transverse colon                           K56.690, Other partial intestinal obstruction                           K64.8, Other hemorrhoids                           K57.30,  Diverticulosis of large intestine without                            perforation or abscess without bleeding                           R93.3, Abnormal findings on diagnostic imaging of                            other parts of digestive tract CPT copyright 2019 American  Medical Association. All rights reserved. The codes documented in this report are preliminary and upon coder review may  be revised to meet current compliance requirements. [name], MD 09/15/2020 8:10:08 AM This report has been signed electronically. Number of Addenda: 0

## 2020-09-16 ENCOUNTER — Encounter (HOSPITAL_COMMUNITY): Payer: Self-pay | Admitting: Gastroenterology

## 2020-09-16 NOTE — Telephone Encounter (Signed)
[name], can you please check on her this morning.  I do not think she ever went to the emergency room from my review of epic.  Vito, Thanks for covering her on my half day off.

## 2020-09-16 NOTE — Telephone Encounter (Signed)
I spoke with the pt and she tells me that she did not want to go to the ED last night as asked.  She did say she feels better today and her temp has dropped to 98.1 F orally , 156/78 BP , 85 HR .  She will call if she does not continue to improve

## 2020-09-16 NOTE — Telephone Encounter (Signed)
   Primary Cardiologist: Rozann Lesches, MD  Chart reviewed as part of pre-operative protocol coverage. Patient was recently seen by Dr. Domenic Polite on 09/07/2020 for pre-op evaluation at which time patient reported one episode of palpitations with associated chest pain as well as intermittent exertional angina depending on level of activity.  Per Dr. Myles Gip note: Preprocedure cardiac evaluation in an 84 year old woman with CAD status post CABG in April of this year, hypertension, PAD, hyperlipidemia, atrial tachycardia, and postoperative atrial fibrillation.  She has a recently documented right colonic mass with intussusception by CT imaging that is concerning for malignancy and she is being considered for diagnostic colonoscopy and most likely abdominal surgery under general anesthesia.  She has had exertional angina symptoms on medical therapy, also an episode of palpitations described above.  We have discussed the situation and reviewed her medications in detail today.  Plan is to increase Lopressor to 75 mg twice daily, add Imdur beginning at 15 mg daily.  Continue aspirin, Norvasc, and Crestor at present doses.  We will schedule her for a Lexiscan Myoview to evaluate overall ischemic burden.  Unless there are high risk features, I anticipate that she should be able to proceed with colonoscopy as a next step and then onto abdominal surgery if necessary at overall intermediate perioperative cardiac risk.  If she has high risk features, this will have to be more carefully considered, although putting her through a cardiac catheterization and potential PCI at this point would require dual antiplatelet therapy and put off further diagnostic assessment of her colon for a minimum of 6 months which is clearly not optimal in the case of suspected malignancy."  Lexiscan Myoview on 09/09/2020 was low risk with findings consistent with prior small inferior MI but no ischemia. Therefore, per Dr. Myles Gip note  above, patient Ok to proceed with surgery at overall intermediate perioperative cardiac risk.  Regarding Aspirin therapy, we typically recommend continuation of Aspirin throughout the perioperative period.  However, if the surgeon feels that cessation of Aspirin is required in the perioperative period, it may be stopped 5-7 days prior to surgery with a plan to resume it as soon as felt to be feasible from a surgical standpoint in the post-operative period.  I will route this recommendation to the requesting party via Epic fax function and remove from pre-op pool.  Please call with questions.  Darreld Mclean, PA-C 09/16/2020, 9:56 AM

## 2020-09-16 NOTE — Telephone Encounter (Signed)
New message    [facility] called would like to have clearance completed.

## 2020-09-19 LAB — SURGICAL PATHOLOGY

## 2020-09-26 DIAGNOSIS — C189 Malignant neoplasm of colon, unspecified: Secondary | ICD-10-CM | POA: Diagnosis not present

## 2020-09-26 DIAGNOSIS — K6389 Other specified diseases of intestine: Secondary | ICD-10-CM | POA: Diagnosis not present

## 2020-09-28 ENCOUNTER — Telehealth: Payer: Self-pay | Admitting: Cardiology

## 2020-09-28 NOTE — Telephone Encounter (Signed)
Reports palpitations are about the same when reported at last visit. Reports this morning after moving around, she felt palpitations and HR was 105 & BP 170/84. Reports taking her medications and all symptoms settled down, with HR 73 & BP 122/67. Reports her HR has been averaging in the 80's. Denies, dizziness, chest pain or sob.  Reports that symptoms  (feeling like a zombie and run down) reported when lopressor was decreased to 50 mg BID on 09/08/2020 aren't any better. Advised that increased dose of lopressor 75 mg BID probably didn't cause it. Advised to restart lopressor 75 mg BID for palpitations. Patient requested a sooner appointment with Domenic Polite prior to surgery to discuss symptoms. Appointment arranged by Delfino Lovett for 10/03/2020 @1 :20 pm.  Verbalized understanding of plan.

## 2020-09-28 NOTE — Telephone Encounter (Signed)
Patient c/o Palpitations:  High priority if patient c/o lightheadedness, shortness of breath, or chest pain  1) How long have you had palpitations/irregular HR/ Afib? Are you having the symptoms now? NO -   2) Are you currently experiencing lightheadedness, SOB or CP? No   3) Do you have a history of afib (atrial fibrillation) or irregular heart rhythm? yes  4) Have you checked your BP or HR? (document readings if available): 170/84 HR 105 (BEOFRE MEDICATIONS)  LATER  122/67 HR 73 10:10am  5) Are you experiencing any other symptoms?   FATIGUE

## 2020-09-29 DIAGNOSIS — Z23 Encounter for immunization: Secondary | ICD-10-CM | POA: Diagnosis not present

## 2020-10-03 ENCOUNTER — Encounter: Payer: Self-pay | Admitting: Cardiology

## 2020-10-03 ENCOUNTER — Ambulatory Visit (INDEPENDENT_AMBULATORY_CARE_PROVIDER_SITE_OTHER): Payer: Medicare Other | Admitting: Cardiology

## 2020-10-03 VITALS — BP 128/74 | HR 84 | Ht 61.5 in | Wt 113.0 lb

## 2020-10-03 DIAGNOSIS — R002 Palpitations: Secondary | ICD-10-CM | POA: Diagnosis not present

## 2020-10-03 DIAGNOSIS — I25119 Atherosclerotic heart disease of native coronary artery with unspecified angina pectoris: Secondary | ICD-10-CM

## 2020-10-03 DIAGNOSIS — I200 Unstable angina: Secondary | ICD-10-CM | POA: Diagnosis not present

## 2020-10-03 MED ORDER — METOPROLOL TARTRATE 50 MG PO TABS
50.0000 mg | ORAL_TABLET | Freq: Three times a day (TID) | ORAL | 1 refills | Status: DC
Start: 2020-10-03 — End: 2020-10-13

## 2020-10-03 NOTE — Progress Notes (Signed)
Cardiology Office Note  Date: 10/03/2020   ID: [name], [name] 06/11/1936, MRN [id number]  PCP:  Glenda Chroman, MD  Cardiologist:  Rozann Lesches, MD Electrophysiologist:  None   Chief Complaint  Patient presents with  . Cardiac follow-up    History of Present Illness: [name] is an 84 y.o. female last seen in October.  She presents for a follow-up visit.  She did undergo EGD with Dr. Ardis Hughs earlier in November.  She had evidence of tumor in the transverse/right colon that was biopsied.  She is now scheduled for laparoscopic right hemicolectomy with Dr. Dema Severin in December.  Follow-up Lexiscan Myoview in October was low risk demonstrating small inferior infarct scar and normal LVEF with no active ischemia.  We have continued medical therapy.  She presents reporting intermittent sense of palpitations, also fatigue.  We had increased her Lopressor to 75 mg twice daily, but she feels less energy on this dose.  We talked about perhaps spreading it out to 50 mg 3 times a day instead.  I personally reviewed her ECG today which shows normal sinus rhythm with left atrial enlargement and nonspecific ST-T changes, heart rate is in the 80s which is better than at last check.  Past Medical History:  Diagnosis Date  . Anemia   . Anxiety   . Arthritis   . CAD (coronary artery disease)    Status post CABG April 2021 - Dr. Servando Snare  . Chronic constipation   . Claudication [facility])    Chronic occlusion of the right limb of aortic stent graft.  . Diverticulosis    Left sided noted on colonoscopy 02/2006  . Ectopic atrial tachycardia ([place])   . Esophageal reflux disease   . Essential hypertension   . Hiatal hernia   . History of TIA (transient ischemic attack)   . Hyperlipidemia   . Lumbar radiculopathy   . Macular degeneration   . Migraines   . Penetrating atherosclerotic ulcer of aorta (HCC)    Complex ulcerated plaque of the infrarenal abdominal aorta mild renal artery  stenosis on the right side, normal left renal artery catheterization 2009, status post aortic stent graft  . Renal artery stenosis ([place])    a. Right renal artery stent April 2013 - Dr. Fletcher Anon;  b. 03/2014 PTA/stenting of RRA 2/2 ISR: 6x18 Herculink stent.  . Trigeminal neuralgia    Right  . Type 2 diabetes mellitus ([place])     Past Surgical History:  Procedure Laterality Date  . ABDOMINAL AORTAGRAM N/A 02/13/2012   Procedure: ABDOMINAL Maxcine Ham;  Surgeon: Wellington Hampshire, MD;  Location: [place] CATH LAB;  Service: Cardiovascular;  Laterality: N/A;  . ABDOMINAL AORTAGRAM N/A 03/17/2014   Procedure: ABDOMINAL Maxcine Ham;  Surgeon: Wellington Hampshire, MD;  Location: [place] CATH LAB;  Service: Cardiovascular;  Laterality: N/A;  . ABDOMINAL HYSTERECTOMY  1982  . BIOPSY  09/15/2020   Procedure: BIOPSY;  Surgeon: [name], MD;  Location: [facility] ENDOSCOPY;  Service: Endoscopy;;  . BREAST BIOPSY Left 1990's  . CAROTID ENDARTERECTOMY Left 1993  . COLONOSCOPY    . COLONOSCOPY WITH PROPOFOL N/A 09/15/2020   Procedure: COLONOSCOPY WITH PROPOFOL;  Surgeon: [name], MD;  Location: [facility] ENDOSCOPY;  Service: Endoscopy;  Laterality: N/A;  . CORONARY ARTERY BYPASS GRAFT N/A 03/02/2020   Procedure: CORONARY ARTERY BYPASS GRAFTING (CABG)x 3, LIMA TO LAD, SVG TO DIAGONAL, SVG TO RCA, WITH OPEN HARVESTING OF LEFT LOWER GREATER SAPHENOUS VEIN AND RIGHT AND LEFT GREATER  SAPHENOUS VEIN HARVESTED ENDOSCOPICALLY;  Surgeon: Grace Isaac, MD;  Location: [place];  Service: Open Heart Surgery;  Laterality: N/A;  . Endologix powerlink bifurcated     Covered stent graft  . ENDOVEIN HARVEST OF GREATER SAPHENOUS VEIN Bilateral 03/02/2020   Procedure: [place] Of Greater Saphenous Vein;  Surgeon: Grace Isaac, MD;  Location: [place];  Service: Open Heart Surgery;  Laterality: Bilateral;  . LEFT HEART CATH AND CORONARY ANGIOGRAPHY N/A 01/28/2020   Procedure: LEFT HEART CATH AND CORONARY ANGIOGRAPHY;  Surgeon: Nelva Bush, MD;  Location: [place] CV LAB;  Service: Cardiovascular;  Laterality: N/A;  . LEFT HEART CATHETERIZATION WITH CORONARY ANGIOGRAM N/A 03/17/2014   Procedure: LEFT HEART CATHETERIZATION WITH CORONARY ANGIOGRAM;  Surgeon: Wellington Hampshire, MD;  Location: [place] CATH LAB;  Service: Cardiovascular;  Laterality: N/A;  . LUMBAR [place]  . PERCUTANEOUS STENT INTERVENTION Right 03/17/2014   Procedure: PERCUTANEOUS STENT INTERVENTION;  Surgeon: Wellington Hampshire, MD;  Location: [place] CATH LAB;  Service: Cardiovascular;  Laterality: Right;  Renal  . RENAL ANGIOGRAM N/A 02/13/2012   Procedure: RENAL ANGIOGRAM;  Surgeon: Wellington Hampshire, MD;  Location: [place] CATH LAB;  Service: Cardiovascular;  Laterality: N/A;  . RENAL ANGIOGRAM N/A 03/17/2014   Procedure: RENAL ANGIOGRAM;  Surgeon: Wellington Hampshire, MD;  Location: [place] CATH LAB;  Service: Cardiovascular;  Laterality: N/A;  . SCLEROTHERAPY  09/15/2020   Procedure: SCLEROTHERAPY;  Surgeon: [name], MD;  Location: [facility];  Service: Endoscopy;;  . TEE WITHOUT CARDIOVERSION N/A 03/02/2020   Procedure: TRANSESOPHAGEAL ECHOCARDIOGRAM (TEE);  Surgeon: Grace Isaac, MD;  Location: [place];  Service: Open Heart Surgery;  Laterality: N/A;  . TONSILLECTOMY AND ADENOIDECTOMY  1947    Current Outpatient Medications  Medication Sig Dispense Refill  . acetaminophen (TYLENOL) 500 MG tablet Take 500 mg by mouth every 6 (six) hours as needed for mild pain or moderate pain.     Marland Kitchen amLODipine (NORVASC) 5 MG tablet Take 1 tablet (5 mg total) by mouth daily. 30 tablet 1  . aspirin EC 325 MG EC tablet Take 1 tablet (325 mg total) by mouth daily. 30 tablet 0  . Cholecalciferol (VITAMIN D3) 1000 UNITS CAPS Take 1,000 Units by mouth daily.     . clonazePAM (KLONOPIN) 0.5 MG tablet Take 0.25 mg by mouth at bedtime.     . docusate sodium (COLACE) 100 MG capsule Take 100 mg by mouth at bedtime.     . isosorbide mononitrate (IMDUR) 30 MG 24 hr tablet Take 0.5  tablets (15 mg total) by mouth daily. (Patient taking differently: Take 15 mg by mouth at bedtime. ) 45 tablet 1  . Lactobacillus (PROBIOTIC ACIDOPHILUS PO) Take 1 tablet by mouth daily.     . metoprolol tartrate (LOPRESSOR) 50 MG tablet Take 1 tablet (50 mg total) by mouth 3 (three) times daily. 270 tablet 1  . Multiple Vitamins-Minerals (PRESERVISION/LUTEIN) CAPS Take 1 capsule by mouth 2 (two) times daily.     . Na Sulfate-K Sulfate-Mg Sulf (SUPREP BOWEL PREP KIT) 17.5-3.13-1.6 GM/177ML SOLN Take 1 kit by mouth as directed. 324 mL 0  . nitroGLYCERIN (NITROSTAT) 0.4 MG SL tablet Place 1 tablet (0.4 mg total) under the tongue every 5 (five) minutes as needed for chest pain. MORE THAN 2 DOSES GO TO ED 25 tablet 3  . pantoprazole (PROTONIX) 40 MG tablet Take 40 mg by mouth daily.     . rosuvastatin (CRESTOR) 20 MG tablet  Take 1 tablet (20 mg total) by mouth at bedtime. 90 tablet 3   No current facility-administered medications for this visit.   Allergies:  Cefixime, Conjugated estrogens, Morphine and related, Codeine, Iohexol, and Adenosine   ROS: Intermittent abdominal pain.  Physical Exam: VS:  BP 128/74   Pulse 84   Ht 5' 1.5" (1.562 m)   Wt 113 lb (51.3 kg)   SpO2 96%   BMI 21.01 kg/m , BMI Body mass index is 21.01 kg/m.  Wt Readings from Last 3 Encounters:  10/03/20 113 lb (51.3 kg)  09/15/20 111 lb (50.3 kg)  09/07/20 112 lb (50.8 kg)    General: Elderly woman, appears comfortable at rest. HEENT: Conjunctiva and lids normal, wearing a mask. Neck: Supple, no elevated JVP or carotid bruits, no thyromegaly. Lungs: Clear to auscultation, nonlabored breathing at rest. Cardiac: Regular rate and rhythm, no S3, soft systolic murmur, no pericardial rub. Abdomen: Soft, nontender, bowel sounds present. Extremities: No pitting edema.  ECG:  An ECG dated 03/09/2020 was personally reviewed today and demonstrated:  Normal sinus rhythm with diffuse nonspecific ST-T wave  abnormalities.  Recent Labwork: 03/03/2020: Magnesium 2.5 03/05/2020: ALT 25; AST 24 03/07/2020: BUN 16; Creatinine, Ser 0.78; Hemoglobin 12.0; Platelets 234; Potassium 3.9; Sodium 137     Component Value Date/Time   CHOL 130 01/29/2020 0202   TRIG 105 01/29/2020 0202   HDL 38 (L) 01/29/2020 0202   CHOLHDL 3.4 01/29/2020 0202   VLDL 21 01/29/2020 0202   LDLCALC 71 01/29/2020 0202    Other Studies Reviewed Today:  Echocardiogram 03/01/2020: 1. Left ventricular ejection fraction, by estimation, is 55 to 60%. The  left ventricle has normal function. The left ventricle has no regional  wall motion abnormalities. There is mild left ventricular hypertrophy of  the basal-septal segment. Left  ventricular diastolic parameters are indeterminate.  2. Right ventricular systolic function is normal. The right ventricular  size is normal. There is normal pulmonary artery systolic pressure.  3. The mitral valve is normal in structure. Mild mitral valve  regurgitation. No evidence of mitral stenosis.  4. The aortic valve is tricuspid. Aortic valve regurgitation is not  visualized. Mild to moderate aortic valve sclerosis/calcification is  present, without any evidence of aortic stenosis.  5. The inferior vena cava is normal in size with greater than 50%  respiratory variability, suggesting right atrial pressure of 3 mmHg.   Lexiscan Myoview 09/09/2020:  There was no ST segment deviation noted during stress.  Findings consistent with prior small inferior myocardial infarction.  This is a low risk study. There is no current ischemia.  The left ventricular ejection fraction is hyperdynamic (>65%).  Assessment and Plan:  1.  Multivessel CAD status post CABG in April 2021.  No active angina at this time.  ECG reviewed today.  She continues on aspirin, Norvasc, Imdur, Lopressor, and Crestor.  2.  Colon cancer with plan for laparoscopic right hemicolectomy with Dr. Dema Severin in December.  She  is overall intermediate perioperative cardiac risk.  Myoview done recently showed small inferior infarct scar with no active ischemia and LVEF is normal.  3.  Intermittent sense of palpitations.  She is in sinus rhythm today by ECG.  Change Lopressor to 50 mg 3 times daily.  Medication Adjustments/Labs and Tests Ordered: Current medicines are reviewed at length with the patient today.  Concerns regarding medicines are outlined above.   Tests Ordered: No orders of the defined types were placed in this encounter.   Medication  Changes: Meds ordered this encounter  Medications  . metoprolol tartrate (LOPRESSOR) 50 MG tablet    Sig: Take 1 tablet (50 mg total) by mouth 3 (three) times daily.    Dispense:  270 tablet    Refill:  1    10/03/2020 dose increase    Disposition:  Follow up in January 2022.  Signed, [name], MD, [facility] 10/03/2020 1:52 PM    [facility], [place], [place] [address] Phone: [phone]; Fax: [phone]

## 2020-10-03 NOTE — Patient Instructions (Signed)
Medication Instructions:   Your physician has recommended you make the following change in your medication:   Change metoprolol tartrate to 50 mg by mouth three times daily (morning, 1 pm, evening)  Continue other medications the same  Labwork:  None  Testing/Procedures:  None  Follow-Up:  Your physician recommends that you schedule a follow-up appointment in: January 2022  Any Other Special Instructions Will Be Listed Below (If Applicable).  If you need a refill on your cardiac medications before your next appointment, please call your pharmacy.

## 2020-10-03 NOTE — Addendum Note (Signed)
Addended by: Merlene Laughter on: 10/03/2020 04:49 PM   Modules accepted: Orders

## 2020-10-11 DIAGNOSIS — I10 Essential (primary) hypertension: Secondary | ICD-10-CM | POA: Diagnosis not present

## 2020-10-13 ENCOUNTER — Telehealth: Payer: Self-pay | Admitting: Cardiology

## 2020-10-13 MED ORDER — METOPROLOL TARTRATE 50 MG PO TABS
25.0000 mg | ORAL_TABLET | Freq: Every morning | ORAL | 1 refills | Status: DC
Start: 2020-10-13 — End: 2021-03-23

## 2020-10-13 NOTE — Telephone Encounter (Signed)
Patient called wanting to know if she could change the way she takes her metoprolol tartrate (LOPRESSOR) 50 MG tablet [[id number]]   States the morning dose makes her feel really bad and drags her down, she can only sit down or lay down. The afternoon and evening doses do not bother her as bad.   Stated she does get short of breath in the mornings- feels like it "cuts her breath off"  Please call [phone]

## 2020-10-13 NOTE — Telephone Encounter (Signed)
Reports feeling drained and weak all the time especially in the mornings and is unsure if lopressor is causing this. Reports symptoms improve later in the day. Reports prior to taking morning medications, her heart feels like its racing. Reports after taking lopressor 50 mg in the morning around 8:30 am - 9:00 am, she feels so drained she has to sit or lie down. Reports BP today before taking meds was 127/66 & HR 89. Reports BP and HR has been averaging around 128/65 HR 79 & 119/61 HR 71. Confirmed lopressor 50 mg TID. Reports HR has been better and ranging in the 70's since taking lopressor 50 mg TID. Denies chest pain or SOB. Reports taking 25 mg lopressor this morning and symptoms did not improve. Patient is requesting to decrease lopressor to 1/2 dose (25 mg) in the mornings since she is not feeling symptoms later in the day after taking lopressor.

## 2020-10-13 NOTE — Telephone Encounter (Signed)
Patient informed and verbalized understanding of plan. 

## 2020-10-13 NOTE — Telephone Encounter (Signed)
Thank you for the update.  Yes, we can make that change.

## 2020-10-14 DIAGNOSIS — Z299 Encounter for prophylactic measures, unspecified: Secondary | ICD-10-CM | POA: Diagnosis not present

## 2020-10-14 DIAGNOSIS — E1129 Type 2 diabetes mellitus with other diabetic kidney complication: Secondary | ICD-10-CM | POA: Diagnosis not present

## 2020-10-14 DIAGNOSIS — E1165 Type 2 diabetes mellitus with hyperglycemia: Secondary | ICD-10-CM | POA: Diagnosis not present

## 2020-10-14 DIAGNOSIS — I10 Essential (primary) hypertension: Secondary | ICD-10-CM | POA: Diagnosis not present

## 2020-10-14 DIAGNOSIS — J329 Chronic sinusitis, unspecified: Secondary | ICD-10-CM | POA: Diagnosis not present

## 2020-10-18 ENCOUNTER — Encounter: Payer: Medicare Other | Admitting: Gastroenterology

## 2020-10-19 ENCOUNTER — Ambulatory Visit: Payer: Self-pay | Admitting: Surgery

## 2020-10-19 NOTE — Progress Notes (Addendum)
COVID Vaccine Completed:  x2 Date COVID Vaccine completed:  Unsure of dates COVID vaccine manufacturer: [place]   PCP - Jerene Bears, MD Cardiologist - Rozann Lesches, MD.  Last [place] 10-03-20  Chest x-ray - 04-07-20 in Epic EKG - 10-03-20 in Epic Stress Test - 09-09-20 in Epic ECHO - 03-02-20 in Epic Cardiac Cath - 01-28-20 in Epic Pacemaker/ICD device last checked:  Sleep Study - 10+ years ago, unsure of results CPAP - No  Fasting Blood Sugar - 100 in the mornings, 200 range in the evening Checks Blood Sugar  - 1 time a day  Blood Thinner Instructions: Aspirin Instructions:  ASA 325 mg.  Pt states that she is stop a week prior to surgery Last Dose:  Anesthesia review:  CAD, atrial tachycardia, ulcer of aorta, AAA, renal artery stenosis,  HTN.  S/P CABG.  PTT 58 and hemoglobin 9.3 preop labs.  Patient denies shortness of breath, fever, cough and chest pain at PAT appointment.  Pt able to climb a flight of stairs and perform ADLs without assistance.   Patient verbalized understanding of instructions that were given to them at the PAT appointment. Patient was also instructed that they will need to review over the PAT instructions again at home before surgery.

## 2020-10-19 NOTE — Patient Instructions (Addendum)
DUE TO COVID-19 ONLY ONE VISITOR IS ALLOWED TO COME WITH YOU AND STAY IN THE WAITING ROOM ONLY DURING PRE OP AND PROCEDURE.   IF YOU WILL BE ADMITTED INTO THE HOSPITAL YOU ARE ALLOWED ONE SUPPORT PERSON DURING VISITATION HOURS ONLY (10AM -8PM)   . The support person may change daily. . The support person must pass our screening, gel in and out, and wear a mask at all times, including in the patient's room. . Patients must also wear a mask when staff or their support person are in the room.   COVID SWAB TESTING MUST BE COMPLETED ON:  Tuesday, 10-25-20 @ 11:00 AM   [address] [place], [place] [address]  (Must self quarantine after testing. Follow instructions on handout.)        Your procedure is scheduled on:  Friday, 10-28-20   Report to [facility] Main  Entrance   Report to admitting at 1:00 PM   Call this number if you have problems the morning of surgery [phone]    Clear liquid diet day of prep to prevent dehydration   Do not eat food :After Midnight.   May have liquids until 12:00 PM day of surgery  CLEAR LIQUID DIET  Foods Allowed                                                                     Foods Excluded  Water, Black Coffee and tea, regular and decaf             liquids that you cannot  Plain Jell-O in any flavor  (No red)                                    see through such as: Fruit ices (not with fruit pulp)                                      milk, soups, orange juice              Iced Popsicles (No red)                                      All solid food                                   Apple juices Sports drinks like Gatorade (No red) Lightly seasoned clear broth or consume(fat free) Sugar, honey syrup  Sample Menu Breakfast                                Lunch                                     Supper Cranberry juice  Beef broth                            Chicken broth Jell-O                                      Grape juice                           Apple juice Coffee or tea                        Jell-O                                      Popsicle                                                Coffee or tea                        Coffee or tea      Drink 2 Ensure drinks the night before surgery.  Complete one Ensure drink the morning of surgery 3 hours prior to scheduled  Surgery at 12:00 PM.   Oral Hygiene is also important to reduce your risk of infection.                                    Remember - BRUSH YOUR TEETH THE MORNING OF SURGERY WITH YOUR REGULAR TOOTHPASTE   Do NOT smoke after Midnight   Take these medicines the morning of surgery with A SIP OF WATER:  Imdur, Metoprolol                               You may not have any metal on your body including hair pins, jewelry, and body piercings             Do not wear make-up, lotions, powders, perfumes/cologne, or deodorant             Do not wear nail polish.  Do not shave  48 hours prior to surgery.     Do not bring valuables to the hospital. [place].   Contacts, dentures or bridgework may not be worn into surgery.   Bring small overnight bag day of surgery.      Special Instructions: Bring a copy of your healthcare power of attorney and living will documents         the day of surgery if you haven't scanned them in before.              Please read over the following fact sheets you were given: IF YOU HAVE QUESTIONS ABOUT YOUR PRE OP INSTRUCTIONS PLEASE CALL [phone]   [facility] - Preparing for Surgery Before surgery, you can play an important role.  Because skin is not sterile, your skin needs to be as free of germs as possible.  You can reduce the number of germs on your skin by washing with CHG (chlorahexidine gluconate) soap before surgery.  CHG is an antiseptic cleaner which kills germs and bonds with the skin to continue killing germs even after washing. Please DO NOT use if  you have an allergy to CHG or antibacterial soaps.  If your skin becomes reddened/irritated stop using the CHG and inform your nurse when you arrive at Short Stay. Do not shave (including legs and underarms) for at least 48 hours prior to the first CHG shower.  You may shave your face/neck.  Please follow these instructions carefully:  1.  Shower with CHG Soap the night before surgery and the  morning of surgery.  2.  If you choose to wash your hair, wash your hair first as usual with your normal  shampoo.  3.  After you shampoo, rinse your hair and body thoroughly to remove the shampoo.                             4.  Use CHG as you would any other liquid soap.  You can apply chg directly to the skin and wash.  Gently with a scrungie or clean washcloth.  5.  Apply the CHG Soap to your body ONLY FROM THE NECK DOWN.   Do   not use on face/ open                           Wound or open sores. Avoid contact with eyes, ears mouth and   genitals (private parts).                       Wash face,  Genitals (private parts) with your normal soap.             6.  Wash thoroughly, paying special attention to the area where your    surgery  will be performed.  7.  Thoroughly rinse your body with warm water from the neck down.  8.  DO NOT shower/wash with your normal soap after using and rinsing off the CHG Soap.                9.  Pat yourself dry with a clean towel.            10.  Wear clean pajamas.            11.  Place clean sheets on your bed the night of your first shower and do not  sleep with pets. Day of Surgery : Do not apply any lotions/deodorants the morning of surgery.  Please wear clean clothes to the hospital/surgery center.  FAILURE TO FOLLOW THESE INSTRUCTIONS MAY RESULT IN THE CANCELLATION OF YOUR SURGERY  PATIENT SIGNATURE_________________________________  NURSE  SIGNATURE__________________________________  ________________________________________________________________________  WHAT IS A BLOOD TRANSFUSION? Blood Transfusion Information  A transfusion is the replacement of blood or some of its parts. Blood is made up of multiple cells which provide different functions.  Red blood cells carry oxygen and are used for blood loss replacement.  White blood cells fight against infection.  Platelets control bleeding.  Plasma helps clot blood.  Other blood products are available for specialized needs, such as hemophilia or other clotting disorders. BEFORE THE TRANSFUSION  Who gives blood for transfusions?   Healthy volunteers who are fully evaluated to make sure their blood is safe. This  is blood bank blood. Transfusion therapy is the safest it has ever been in the practice of medicine. Before blood is taken from a donor, a complete history is taken to make sure that person has no history of diseases nor engages in risky social behavior (examples are intravenous drug use or sexual activity with multiple partners). The donor's travel history is screened to minimize risk of transmitting infections, such as malaria. The donated blood is tested for signs of infectious diseases, such as HIV and hepatitis. The blood is then tested to be sure it is compatible with you in order to minimize the chance of a transfusion reaction. If you or a relative donates blood, this is often done in anticipation of surgery and is not appropriate for emergency situations. It takes many days to process the donated blood. RISKS AND COMPLICATIONS Although transfusion therapy is very safe and saves many lives, the main dangers of transfusion include:   Getting an infectious disease.  Developing a transfusion reaction. This is an allergic reaction to something in the blood you were given. Every precaution is taken to prevent this. The decision to have a blood transfusion has been  considered carefully by your caregiver before blood is given. Blood is not given unless the benefits outweigh the risks. AFTER THE TRANSFUSION  Right after receiving a blood transfusion, you will usually feel much better and more energetic. This is especially true if your red blood cells have gotten low (anemic). The transfusion raises the level of the red blood cells which carry oxygen, and this usually causes an energy increase.  The nurse administering the transfusion will monitor you carefully for complications. HOME CARE INSTRUCTIONS  No special instructions are needed after a transfusion. You may find your energy is better. Speak with your caregiver about any limitations on activity for underlying diseases you may have. SEEK MEDICAL CARE IF:   Your condition is not improving after your transfusion.  You develop redness or irritation at the intravenous (IV) site. SEEK IMMEDIATE MEDICAL CARE IF:  Any of the following symptoms occur over the next 12 hours:  Shaking chills.  You have a temperature by mouth above 102 F (38.9 C), not controlled by medicine.  Chest, back, or muscle pain.  People around you feel you are not acting correctly or are confused.  Shortness of breath or difficulty breathing.  Dizziness and fainting.  You get a rash or develop hives.  You have a decrease in urine output.  Your urine turns a dark color or changes to pink, red, or brown. Any of the following symptoms occur over the next 10 days:  You have a temperature by mouth above 102 F (38.9 C), not controlled by medicine.  Shortness of breath.  Weakness after normal activity.  The white part of the eye turns yellow (jaundice).  You have a decrease in the amount of urine or are urinating less often.  Your urine turns a dark color or changes to pink, red, or brown. Document Released: 10/26/2000 Document Revised: 01/21/2012 Document Reviewed: 06/14/2008 ExitCare Patient Information 2014  [employer].  _______________________________________________________________________   Incentive Spirometer  An incentive spirometer is a tool that can help keep your lungs clear and active. This tool measures how well you are filling your lungs with each breath. Taking long deep breaths may help reverse or decrease the chance of developing breathing (pulmonary) problems (especially infection) following:  A long period of time when you are unable to move or be active. BEFORE  THE PROCEDURE   If the spirometer includes an indicator to show your best effort, your nurse or respiratory therapist will set it to a desired goal.  If possible, sit up straight or lean slightly forward. Try not to slouch.  Hold the incentive spirometer in an upright position. INSTRUCTIONS FOR USE  1. Sit on the edge of your bed if possible, or sit up as far as you can in bed or on a chair. 2. Hold the incentive spirometer in an upright position. 3. Breathe out normally. 4. Place the mouthpiece in your mouth and seal your lips tightly around it. 5. Breathe in slowly and as deeply as possible, raising the piston or the ball toward the top of the column. 6. Hold your breath for 3-5 seconds or for as long as possible. Allow the piston or ball to fall to the bottom of the column. 7. Remove the mouthpiece from your mouth and breathe out normally. 8. Rest for a few seconds and repeat Steps 1 through 7 at least 10 times every 1-2 hours when you are awake. Take your time and take a few normal breaths between deep breaths. 9. The spirometer may include an indicator to show your best effort. Use the indicator as a goal to work toward during each repetition. 10. After each set of 10 deep breaths, practice coughing to be sure your lungs are clear. If you have an incision (the cut made at the time of surgery), support your incision when coughing by placing a pillow or rolled up towels firmly against it. Once you are able to  get out of bed, walk around indoors and cough well. You may stop using the incentive spirometer when instructed by your caregiver.  RISKS AND COMPLICATIONS  Take your time so you do not get dizzy or light-headed.  If you are in pain, you may need to take or ask for pain medication before doing incentive spirometry. It is harder to take a deep breath if you are having pain. AFTER USE  Rest and breathe slowly and easily.  It can be helpful to keep track of a log of your progress. Your caregiver can provide you with a simple table to help with this. If you are using the spirometer at home, follow these instructions: [place] IF:   You are having difficultly using the spirometer.  You have trouble using the spirometer as often as instructed.  Your pain medication is not giving enough relief while using the spirometer.  You develop fever of 100.5 F (38.1 C) or higher. SEEK IMMEDIATE MEDICAL CARE IF:   You cough up bloody sputum that had not been present before.  You develop fever of 102 F (38.9 C) or greater.  You develop worsening pain at or near the incision site. MAKE SURE YOU:   Understand these instructions.  Will watch your condition.  Will get help right away if you are not doing well or get worse. Document Released: 03/11/2007 Document Revised: 01/21/2012 Document Reviewed: 05/12/2007 [facility] Patient Information 2014 [place], [place].   ________________________________________________________________________

## 2020-10-19 NOTE — H&P (Signed)
CC: Referred by PCP for 'colon mass'  HPI: [name] is a very pleasant 65yoF with hx of HTN, pre-DM, CAD (s/p CABG 67moago) whom had CT abdomen/pelvis performed by PCP 08/10/20 that showed abnormal appearance of the right colon with a colocolonic intussusception type appearance and thickened wall with abnormally enlarged mesenteric nodes in the right colon mesentery. Appearance was read as being concerning for possible mass/malignancy no evidence of obstruction. This was performed for a multi-month history of some crampy vague abdominal discomforts that are intermittent and recurrent various locations throughout her abdomen including in both upper and lower quadrants. She denies any history of nausea/vomiting or significant abdominal bloating. She denies any blood in her stool. She did have a CABG performed 02/2020 with Dr. GServando Snare With the CT scan results she was referred to our office. She has not had a colonoscopy completed as of yet. A referral was placed for this now 3 weeks ago.  Her last colonoscopy was 4/11/207 with a Dr. WSammuel Cooper She was found to have left-sided diverticulosis. She believes she may have had polyps removed. She is recommended to have a repeat colonoscopy in 2017. She declined to have this performed multiple times as she did not want to go through the procedure.  She denies any abdominal pain currently. She denies any nausea/vomiting. She reports she does have regular bowel movements. She has a temperature history of constipation and take stool softeners for. She does not believe any of her symptoms have changed. She denies fevers/chills.  INTERVAL HX She underwent colonoscopy with Dr. JArdis Hughs11/4/21 which demonstrated a large spherical ulcerated partially necrotic tumor in the transverse/right colon. He is unable to traverse the lesion per se. He was able to tattoo distal to this. Biopsies were obtained and demonstrated invasive adenocarcinoma with loss of MLH1  and PMS2. Hypermethylation/BRAF/MSI underway. [facility] of metastatic disease in the chest. She obtained preoperative clearance with an intermediate risk assessment from Dr. MDomenic Polite her cardiologist. She has been doing well. She denies any complaints at this time including abdominal pain, bloating, nausea/vomiting.  PMH: HTN, pre-DM, CAD (s/p CABG 659mogo)  PSH: Hysterectomy - open - 1980s. Denies any other abdominal surgical hx.  FHx: Denies FHx of colorectal, breast, endometrial, ovarian or cervical cancer  Social: Denies use of tobacco/EtOH/drugs.  ROS: A comprehensive 10 system review of systems was completed with the patient and pertinent findings as noted above.  The patient is a [age]ear old female.   Allergies ([name]; 09/26/2020 1:42 PM) Cefixime *CEPHALOSPORINS*  Shortness of breath. Estrogens Conjugated *ESTROGENS*  Morphine Sulfate (Concentrate) *ANALGESICS - OPIOID*  Codeine Sulfate *ANALGESICS - OPIOID*  Adenosine *CHEMICALS*  Allergies Reconciled   Medication History ([name]; 09/26/2020 1:43 PM) amLODIPine Besylate (5MG Tablet, Oral) Active. Aspirin EC (325MG Tablet DR, Oral) Active. clonazePAM (0.5MG Tablet, Oral) Active. Metoprolol Tartrate (50MG Tablet, Oral) Active. Pantoprazole Sodium (40MG Tablet DR, Oral) Active. Polyethylene Glycol 3350 (17GM/SCOOP Powder, Oral) Active. Rosuvastatin Calcium (20MG Tablet, Oral) Active. Imdur (30MG Tablet ER 24HR, Oral) Active. Medications Reconciled    Review of Systems (CHarrell Gave. [name]; 09/26/2020 2:15 PM) General Present- Chills. Not Present- Appetite Loss, Fatigue, Fever, Night Sweats, Weight Gain and Weight Loss. Skin Not Present- Change in Wart/Mole, Dryness, Hives, Jaundice, New Lesions, Non-Healing Wounds, Rash and Ulcer. HEENT Present- Wears glasses/contact lenses. Not Present-  Earache, Hearing Loss, Hoarseness, Nose Bleed, Oral Ulcers, Ringing in the  Ears, Seasonal Allergies, Sinus Pain, Sore Throat, Visual Disturbances and Yellow Eyes. Breast Not Present- Breast Mass, Breast Pain, Nipple Discharge and Skin Changes. Cardiovascular Present- Palpitations. Not Present- Chest Pain, Difficulty Breathing Lying Down, Leg Cramps, Rapid Heart Rate, Shortness of Breath and Swelling of Extremities. Gastrointestinal Present- Hemorrhoids. Not Present- Abdominal Pain, Bloody Stool, Change in Bowel Habits, Chronic diarrhea, Constipation, Difficulty Swallowing, Excessive gas, Gets full quickly at meals, Indigestion, Nausea, Rectal Pain and Vomiting. Female Genitourinary Present- Frequency. Not Present- Nocturia, Painful Urination, Pelvic Pain and Urgency. Musculoskeletal Present- Back Pain. Not Present- Joint Pain, Joint Stiffness, Muscle Pain, Muscle Weakness and Swelling of Extremities. Endocrine Present- Cold Intolerance. Not Present- Excessive Hunger, Hair Changes, Heat Intolerance, Hot flashes and New Diabetes. Hematology Not Present- Blood Thinners, Easy Bruising, Excessive bleeding, Gland problems, HIV and Persistent Infections.  Vitals [name]; 09/26/2020 1:43 PM) 09/26/2020 1:43 PM Weight: 113.4 lb Height: 61in Body Surface Area: 1.48 m Body Mass Index: 21.43 kg/m  Temp.: 98.38F (Temporal)  Pulse: 78 (Regular)  BP: 142/78(Sitting, Left Arm, Standard)       Physical Exam [name]; 09/26/2020 2:15 PM) The physical exam findings are as follows: Note: Constitutional: No acute distress; conversant; wearing mask Eyes: Moist conjunctiva; no lid lag; anicteric sclerae; pupils equal and round Neck: Trachea midline Lungs: Normal respiratory effort; no tactile fremitus CV: rrr; no pitting edema GI: Abdomen soft, nontender, nondistended; no palpable hepatosplenomegaly. No clearly palpable masses although habitus may hinder this  somewhat. MSK: Normal gait; no clubbing/cyanosis Psychiatric: Appropriate affect; alert and oriented 3    Assessment & Plan [name]; 09/26/2020 2:20 PM) MASS OF COLON (W73.71) Story: Ms. Kreh is a very pleasant 34yoF with hx of HTN, pre-DM, CAD (s/p CABG 35moago) - found to have mass near hepatic flexure and colo-colonic lead point intussusception 07/2020. [place] 08/10/20 - mass in right colon with colo-colonic intussusception; no evidence of 'obstruction' CT Chest 08/2020 - no evidence of metastatic disease in the chest Colonoscopy 09/15/20 - Dr. JArdis Hughs- mass in proximal transverse colon, adenocarcinoma; loss of MLH1/PMS2 - additional testing pending Impression: -CBC, CMP, CEA -Cardiac clearance obtained from Dr. MDomenic Polite- intermediate risk -We also reviewed ER warnings in interim with her and her daughter -The anatomy and physiology of the GI tract was discussed at length with the patient. The pathophysiology of colon cancer was discussed at length with associated pictures. Overall treatment options reviewed including things from no further treatment to chemotherapy to surgery - with surgery offering only potential for 'curative intent.' -We have reviewed laparoscopic right hemicolectomy; scenarios where open surgery may be necessary; possible adhesiolysis based on intraoperative findings. -The planned procedures, material risks (including, but not limited to, pain, bleeding, infection, scarring, need for blood transfusion, damage to surrounding structures- blood vessels/nerves/viscus/organs, damage to ureter, urine leak, leak from anastomosis, need for additional procedures, worsening of pre-existing medical conditions, need for stoma which may be permanent, hernia, recurrence, DVT/PE, pneumonia, heart attack, stroke, death) benefits and alternatives to surgery were discussed at length. The patient's and her daughter's questions were answered to  their satisfaction, they voiced understanding and elected to proceed with surgery. Additionally, we discussed typical postoperative expectations and the recovery process.  This patient encounter took 49 minutes today to perform the following: take history, perform exam, review outside records, interpret imaging, counsel the patient on their diagnosis and document encounter, findings & plan in the EHR  Signed by CSharon Mt  Charlea Nardo, MD (09/26/2020 2:21 PM)

## 2020-10-20 ENCOUNTER — Encounter (HOSPITAL_COMMUNITY)
Admission: RE | Admit: 2020-10-20 | Discharge: 2020-10-20 | Disposition: A | Discharge status: Home / Self Care | Payer: Medicare Other | Source: Ambulatory Visit | Attending: Surgery | Admitting: Surgery

## 2020-10-20 ENCOUNTER — Encounter (HOSPITAL_COMMUNITY): Payer: Self-pay

## 2020-10-20 ENCOUNTER — Other Ambulatory Visit: Payer: Self-pay

## 2020-10-20 DIAGNOSIS — Z01812 Encounter for preprocedural laboratory examination: Secondary | ICD-10-CM | POA: Insufficient documentation

## 2020-10-20 DIAGNOSIS — I2510 Atherosclerotic heart disease of native coronary artery without angina pectoris: Secondary | ICD-10-CM | POA: Insufficient documentation

## 2020-10-20 DIAGNOSIS — C189 Malignant neoplasm of colon, unspecified: Secondary | ICD-10-CM | POA: Diagnosis not present

## 2020-10-20 DIAGNOSIS — Z87891 Personal history of nicotine dependence: Secondary | ICD-10-CM | POA: Insufficient documentation

## 2020-10-20 DIAGNOSIS — Z79899 Other long term (current) drug therapy: Secondary | ICD-10-CM | POA: Diagnosis not present

## 2020-10-20 DIAGNOSIS — Z951 Presence of aortocoronary bypass graft: Secondary | ICD-10-CM | POA: Insufficient documentation

## 2020-10-20 DIAGNOSIS — E119 Type 2 diabetes mellitus without complications: Secondary | ICD-10-CM | POA: Diagnosis not present

## 2020-10-20 HISTORY — DX: Malignant neoplasm of colon, unspecified: C18.9

## 2020-10-20 LAB — CBC WITH DIFFERENTIAL/PLATELET
Abs Immature Granulocytes: 0.03 10*3/uL (ref 0.00–0.07)
Basophils Absolute: 0 10*3/uL (ref 0.0–0.1)
Basophils Relative: 0 %
Eosinophils Absolute: 0.2 10*3/uL (ref 0.0–0.5)
Eosinophils Relative: 3 %
HCT: 32.2 % — ABNORMAL LOW (ref 36.0–46.0)
Hemoglobin: 9.3 g/dL — ABNORMAL LOW (ref 12.0–15.0)
Immature Granulocytes: 0 %
Lymphocytes Relative: 20 %
Lymphs Abs: 1.6 10*3/uL (ref 0.7–4.0)
MCH: 21.4 pg — ABNORMAL LOW (ref 26.0–34.0)
MCHC: 28.9 g/dL — ABNORMAL LOW (ref 30.0–36.0)
MCV: 74 fL — ABNORMAL LOW (ref 80.0–100.0)
Monocytes Absolute: 0.8 10*3/uL (ref 0.1–1.0)
Monocytes Relative: 10 %
Neutro Abs: 5.4 10*3/uL (ref 1.7–7.7)
Neutrophils Relative %: 67 %
Platelets: 387 10*3/uL (ref 150–400)
RBC: 4.35 MIL/uL (ref 3.87–5.11)
RDW: 16.9 % — ABNORMAL HIGH (ref 11.5–15.5)
WBC: 8 10*3/uL (ref 4.0–10.5)
nRBC: 0 % (ref 0.0–0.2)

## 2020-10-20 LAB — GLUCOSE, CAPILLARY: Glucose-Capillary: 134 mg/dL — ABNORMAL HIGH (ref 70–99)

## 2020-10-20 LAB — COMPREHENSIVE METABOLIC PANEL
ALT: 26 U/L (ref 0–44)
AST: 24 U/L (ref 15–41)
Albumin: 3.7 g/dL (ref 3.5–5.0)
Alkaline Phosphatase: 117 U/L (ref 38–126)
Anion gap: 8 (ref 5–15)
BUN: 16 mg/dL (ref 8–23)
CO2: 25 mmol/L (ref 22–32)
Calcium: 10 mg/dL (ref 8.9–10.3)
Chloride: 102 mmol/L (ref 98–111)
Creatinine, Ser: 0.67 mg/dL (ref 0.44–1.00)
GFR, Estimated: >60 mL/min (ref >60)
Glucose, Bld: 115 mg/dL — ABNORMAL HIGH (ref 70–99)
Potassium: 4.5 mmol/L (ref 3.5–5.1)
Sodium: 135 mmol/L (ref 135–145)
Total Bilirubin: 0.5 mg/dL (ref 0.3–1.2)
Total Protein: 6.9 g/dL (ref 6.5–8.1)

## 2020-10-20 LAB — PROTIME-INR
INR: 1 (ref 0.8–1.2)
Prothrombin Time: 13 seconds (ref 11.4–15.2)

## 2020-10-20 LAB — HEMOGLOBIN A1C
Hgb A1c MFr Bld: 7.3 % — ABNORMAL HIGH (ref 4.8–5.6)
Mean Plasma Glucose: 162.81 mg/dL

## 2020-10-20 LAB — APTT: aPTT: 58 seconds — ABNORMAL HIGH (ref 24–36)

## 2020-10-20 NOTE — Progress Notes (Addendum)
CBC with Diff, PTT and A1c sent to Dr. Dema Severin to review.

## 2020-10-24 NOTE — Anesthesia Preprocedure Evaluation (Addendum)
Anesthesia Evaluation  Patient identified by MRN, date of birth, ID band Patient awake    Reviewed: Allergy & Precautions, NPO status , Patient's Chart, lab work & pertinent test results  History of Anesthesia Complications Negative for: history of anesthetic complications  Airway Mallampati: II  TM Distance: >3 FB Neck ROM: Full    Dental  (+) Teeth Intact   Pulmonary neg pulmonary ROS, former smoker,    Pulmonary exam normal        Cardiovascular hypertension, + CAD, + CABG (02/2020) and + Peripheral Vascular Disease  Normal cardiovascular exam     Neuro/Psych negative neurological ROS  negative psych ROS   GI/Hepatic Neg liver ROS, hiatal hernia, GERD  ,  Endo/Other  diabetes, Type 2  Renal/GU negative Renal ROS  negative genitourinary   Musculoskeletal  (+) Arthritis ,   Abdominal   Peds  Hematology  (+) anemia ,   Anesthesia Other Findings   Echo 03/01/2020  1. Left ventricular ejection fraction, by estimation, is 55 to 60%. The  left ventricle has normal function. The left ventricle has no regional  wall motion abnormalities. There is mild left ventricular hypertrophy of  the basal-septal segment. Left  ventricular diastolic parameters are indeterminate.  2. Right ventricular systolic function is normal. The right ventricular  size is normal. There is normal pulmonary artery systolic pressure.  3. The mitral valve is normal in structure. Mild mitral valve  regurgitation. No evidence of mitral stenosis.  4. The aortic valve is tricuspid. Aortic valve regurgitation is not  visualized. Mild to moderate aortic valve sclerosis/calcification is  present, without any evidence of aortic stenosis.  5. The inferior vena cava is normal in size with greater than 50%  respiratory variability, suggesting right atrial pressure of 3 mmHg.   Stress test 09/09/20:   There was no ST segment deviation noted during  stress.  Findings consistent with prior small inferior myocardial infarction.  This is a low risk study. There is no current ischemia.  The left ventricular ejection fraction is hyperdynamic (>65%).   Pt last seen by cardiology 10/03/20. Per OV note, "Colon cancer with plan for laparoscopic right hemicolectomy with Dr. Dema Severin in December. She is overall intermediate perioperative cardiac risk. Myoview done recently showed small inferior infarct scar with no active ischemia and LVEF is normal."  Reproductive/Obstetrics                          Anesthesia Physical Anesthesia Plan  ASA: III  Anesthesia Plan: General   Post-op Pain Management:    Induction: Intravenous  PONV Risk Score and Plan: 3 and Ondansetron, Dexamethasone, Treatment may vary due to age or medical condition and Midazolam  Airway Management Planned: Oral ETT  Additional Equipment: None  Intra-op Plan:   Post-operative Plan: Extubation in OR  Informed Consent: I have reviewed the patients History and Physical, chart, labs and discussed the procedure including the risks, benefits and alternatives for the proposed anesthesia with the patient or authorized representative who has indicated his/her understanding and acceptance.     Dental advisory given  Plan Discussed with:   Anesthesia Plan Comments: (See PAT note 10/20/2020, Konrad Felix, PA-C)       Anesthesia Quick Evaluation

## 2020-10-24 NOTE — Progress Notes (Signed)
Anesthesia Chart Review   Case: [id number] Date/Time: 10/28/20 1445   Procedure: LAPAROSCOPIC RIGHT HEMI COLECTOMY, POSSIBLE ADHESIOLYSIS (Right )   Anesthesia type: General   Pre-op diagnosis: colon cancer   Location: [place] [facility]   Surgeons: Ileana Roup, MD      DISCUSSION:84 y.o. former smoker with h/o DM II, CAD (CABG 02/2020), colon cancer scheduled for above procedure 10/28/2020 with Dr. Nadeen Landau.   Pt last seen by cardiology 10/03/20. Per OV note, "Colon cancer with plan for laparoscopic right hemicolectomy with Dr. Dema Severin in December.  She is overall intermediate perioperative cardiac risk.  Myoview done recently showed small inferior infarct scar with no active ischemia and LVEF is normal."  Anticipate pt can proceed with planned procedure barring acute status change.   VS: BP (!) 145/73   Pulse 74   Temp 36.7 C (Oral)   Resp 18   Ht 5' 1.5" (1.562 m)   Wt 50.5 kg   SpO2 100%   BMI 20.70 kg/m   PROVIDERS: Glenda Chroman, MD is PCP   Rozann Lesches, MD is Cardiologist  LABS: Labs reviewed: Acceptable for surgery. (all labs ordered are listed, but only abnormal results are displayed)  Labs Reviewed  HEMOGLOBIN A1C - Abnormal; Notable for the following components:      Result Value   Hgb A1c MFr Bld 7.3 (*)    All other components within normal limits  CBC WITH DIFFERENTIAL/PLATELET - Abnormal; Notable for the following components:   Hemoglobin 9.3 (*)    HCT 32.2 (*)    MCV 74.0 (*)    MCH 21.4 (*)    MCHC 28.9 (*)    RDW 16.9 (*)    All other components within normal limits  COMPREHENSIVE METABOLIC PANEL - Abnormal; Notable for the following components:   Glucose, Bld 115 (*)    All other components within normal limits  APTT - Abnormal; Notable for the following components:   aPTT 58 (*)    All other components within normal limits  GLUCOSE, CAPILLARY - Abnormal; Notable for the following components:   Glucose-Capillary 134 (*)     All other components within normal limits  PROTIME-INR  TYPE AND SCREEN     IMAGES:   EKG: 10/03/20 Rate 81 bpm  NSR Possible left atrial enlargement Nonspecific ST and T wave abnormality   CV: Echo 03/01/2020 IMPRESSIONS    1. Left ventricular ejection fraction, by estimation, is 55 to 60%. The  left ventricle has normal function. The left ventricle has no regional  wall motion abnormalities. There is mild left ventricular hypertrophy of  the basal-septal segment. Left  ventricular diastolic parameters are indeterminate.  2. Right ventricular systolic function is normal. The right ventricular  size is normal. There is normal pulmonary artery systolic pressure.  3. The mitral valve is normal in structure. Mild mitral valve  regurgitation. No evidence of mitral stenosis.  4. The aortic valve is tricuspid. Aortic valve regurgitation is not  visualized. Mild to moderate aortic valve sclerosis/calcification is  present, without any evidence of aortic stenosis.  5. The inferior vena cava is normal in size with greater than 50%  respiratory variability, suggesting right atrial pressure of 3 mmHg.   Comparison(s): No significant change from prior study.  Past Medical History:  Diagnosis Date  . Anemia   . Anxiety   . Arthritis   . CAD (coronary artery disease)    Status post CABG April 2021 - Dr.  Gerhardt  . Chronic constipation   . Claudication [facility])    Chronic occlusion of the right limb of aortic stent graft.  . Colon cancer ([place])   . Diverticulosis    Left sided noted on colonoscopy 02/2006  . Ectopic atrial tachycardia ([place])   . Esophageal reflux disease   . Essential hypertension   . Hiatal hernia   . History of TIA (transient ischemic attack)   . Hyperlipidemia   . Lumbar radiculopathy   . Macular degeneration   . Migraines   . Penetrating atherosclerotic ulcer of aorta ([facility])    Complex ulcerated plaque of the infrarenal abdominal aorta mild renal artery  stenosis on the right side, normal left renal artery catheterization 2009, status post aortic stent graft  . Renal artery stenosis ([place])    a. Right renal artery stent April 2013 - Dr. Fletcher Anon;  b. 03/2014 PTA/stenting of RRA 2/2 ISR: 6x18 Herculink stent.  . Trigeminal neuralgia    Right  . Type 2 diabetes mellitus ([place])     Past Surgical History:  Procedure Laterality Date  . ABDOMINAL AORTAGRAM N/A 02/13/2012   Procedure: ABDOMINAL Maxcine Ham;  Surgeon: Wellington Hampshire, MD;  Location: [place] CATH LAB;  Service: Cardiovascular;  Laterality: N/A;  . ABDOMINAL AORTAGRAM N/A 03/17/2014   Procedure: ABDOMINAL Maxcine Ham;  Surgeon: Wellington Hampshire, MD;  Location: [place] CATH LAB;  Service: Cardiovascular;  Laterality: N/A;  . ABDOMINAL HYSTERECTOMY  1982  . BIOPSY  09/15/2020   Procedure: BIOPSY;  Surgeon: [name], MD;  Location: [facility];  Service: Endoscopy;;  . BREAST BIOPSY Left 1990's  . CAROTID ENDARTERECTOMY Left 1993  . COLONOSCOPY    . COLONOSCOPY WITH PROPOFOL N/A 09/15/2020   Procedure: COLONOSCOPY WITH PROPOFOL;  Surgeon: [name], MD;  Location: [facility];  Service: Endoscopy;  Laterality: N/A;  . CORONARY ARTERY BYPASS GRAFT N/A 03/02/2020   Procedure: CORONARY ARTERY BYPASS GRAFTING (CABG)x 3, LIMA TO LAD, SVG TO DIAGONAL, SVG TO RCA, WITH OPEN HARVESTING OF LEFT LOWER GREATER SAPHENOUS VEIN AND RIGHT AND LEFT GREATER SAPHENOUS VEIN HARVESTED ENDOSCOPICALLY;  Surgeon: Grace Isaac, MD;  Location: [place];  Service: Open Heart Surgery;  Laterality: N/A;  . Endologix powerlink bifurcated     Covered stent graft  . ENDOVEIN HARVEST OF GREATER SAPHENOUS VEIN Bilateral 03/02/2020   Procedure: [place] Of Greater Saphenous Vein;  Surgeon: Grace Isaac, MD;  Location: [place];  Service: Open Heart Surgery;  Laterality: Bilateral;  . LEFT HEART CATH AND CORONARY ANGIOGRAPHY N/A 01/28/2020   Procedure: LEFT HEART CATH AND CORONARY ANGIOGRAPHY;  Surgeon: Nelva Bush, MD;  Location: [place] CV LAB;  Service: Cardiovascular;  Laterality: N/A;  . LEFT HEART CATHETERIZATION WITH CORONARY ANGIOGRAM N/A 03/17/2014   Procedure: LEFT HEART CATHETERIZATION WITH CORONARY ANGIOGRAM;  Surgeon: Wellington Hampshire, MD;  Location: [place] CATH LAB;  Service: Cardiovascular;  Laterality: N/A;  . LUMBAR [place]  . PERCUTANEOUS STENT INTERVENTION Right 03/17/2014   Procedure: PERCUTANEOUS STENT INTERVENTION;  Surgeon: Wellington Hampshire, MD;  Location: [place] CATH LAB;  Service: Cardiovascular;  Laterality: Right;  Renal  . RENAL ANGIOGRAM N/A 02/13/2012   Procedure: RENAL ANGIOGRAM;  Surgeon: Wellington Hampshire, MD;  Location: [place] CATH LAB;  Service: Cardiovascular;  Laterality: N/A;  . RENAL ANGIOGRAM N/A 03/17/2014   Procedure: RENAL ANGIOGRAM;  Surgeon: Wellington Hampshire, MD;  Location: [place] CATH LAB;  Service: Cardiovascular;  Laterality: N/A;  . SCLEROTHERAPY  09/15/2020  Procedure: SCLEROTHERAPY;  Surgeon: [name], MD;  [facility];  Service: Endoscopy;;  . TEE WITHOUT CARDIOVERSION N/A 03/02/2020   Procedure: TRANSESOPHAGEAL ECHOCARDIOGRAM (TEE);  Surgeon: Grace Isaac, MD;  Location: [place];  Service: Open Heart Surgery;  Laterality: N/A;  . TONSILLECTOMY AND ADENOIDECTOMY  1947    MEDICATIONS: . acetaminophen (TYLENOL) 500 MG tablet  . amLODipine (NORVASC) 5 MG tablet  . aspirin EC 325 MG EC tablet  . Cholecalciferol (VITAMIN D3) 1000 UNITS CAPS  . clonazePAM (KLONOPIN) 0.5 MG tablet  . docusate sodium (COLACE) 100 MG capsule  . isosorbide mononitrate (IMDUR) 30 MG 24 hr tablet  . Lactobacillus (PROBIOTIC ACIDOPHILUS PO)  . metoprolol tartrate (LOPRESSOR) 50 MG tablet  . metroNIDAZOLE (FLAGYL) 500 MG tablet  . Multiple Vitamins-Minerals (PRESERVISION AREDS 2 PO)  . Na Sulfate-K Sulfate-Mg Sulf (SUPREP BOWEL PREP KIT) 17.5-3.13-1.6 GM/177ML SOLN  . neomycin (MYCIFRADIN) 500 MG tablet  . nitroGLYCERIN (NITROSTAT) 0.4 MG SL tablet  .  pantoprazole (PROTONIX) 40 MG tablet  . rosuvastatin (CRESTOR) 20 MG tablet   No current facility-administered medications for this encounter.     [name] Felix, PA-C [facility] [phone]

## 2020-10-25 ENCOUNTER — Other Ambulatory Visit (HOSPITAL_COMMUNITY)
Admission: RE | Admit: 2020-10-25 | Discharge: 2020-10-25 | Disposition: A | Discharge status: Home / Self Care | Payer: Medicare Other | Source: Ambulatory Visit | Attending: Surgery | Admitting: Surgery

## 2020-10-25 DIAGNOSIS — Z20822 Contact with and (suspected) exposure to covid-19: Secondary | ICD-10-CM | POA: Insufficient documentation

## 2020-10-25 DIAGNOSIS — Z01812 Encounter for preprocedural laboratory examination: Secondary | ICD-10-CM | POA: Insufficient documentation

## 2020-10-25 LAB — SARS CORONAVIRUS 2 (TAT 6-24 HRS): SARS Coronavirus 2: NEGATIVE

## 2020-10-26 ENCOUNTER — Ambulatory Visit: Payer: Medicare Other | Admitting: Cardiology

## 2020-10-26 ENCOUNTER — Telehealth: Payer: Self-pay | Admitting: Cardiology

## 2020-10-26 MED ORDER — AMLODIPINE BESYLATE 5 MG PO TABS
5.0000 mg | ORAL_TABLET | Freq: Every day | ORAL | 3 refills | Status: DC
Start: 2020-10-26 — End: 2021-10-16

## 2020-10-26 NOTE — Telephone Encounter (Signed)
*  STAT* If patient is at the pharmacy, call can be transferred to refill team.   1. Which medications need to be refilled? (please list name of each medication and dose if known) Generic Norvasc 5mg   2. Which pharmacy/location (including street and city if local pharmacy) is medication to be sent to? [facility]   3. Do they need a 30 day or 90 day supply? 90 day supply

## 2020-10-27 MED ORDER — GENTAMICIN SULFATE 40 MG/ML IJ SOLN
5.0000 mg/kg | INTRAVENOUS | Status: AC
  Administered 2020-10-28: 15:00:00 250 mg via INTRAVENOUS
  Filled 2020-10-27: qty 6.25

## 2020-10-27 MED ORDER — BUPIVACAINE LIPOSOME 1.3 % IJ SUSP
20.0000 mL | INTRAMUSCULAR | Status: DC
  Filled 2020-10-27: qty 20

## 2020-10-28 ENCOUNTER — Encounter (HOSPITAL_COMMUNITY)
Admission: RE | Disposition: A | Discharge status: Home / Self Care | Payer: Self-pay | Source: Home / Self Care | Attending: Surgery

## 2020-10-28 ENCOUNTER — Inpatient Hospital Stay (HOSPITAL_COMMUNITY): Payer: Medicare Other | Admitting: Physician Assistant

## 2020-10-28 ENCOUNTER — Other Ambulatory Visit: Payer: Self-pay

## 2020-10-28 ENCOUNTER — Inpatient Hospital Stay (HOSPITAL_COMMUNITY)
Admission: RE | Admit: 2020-10-28 | Discharge: 2020-11-01 | DRG: 330 | Disposition: A | Discharge status: Home / Self Care | Payer: Medicare Other | Attending: Surgery | Admitting: Surgery

## 2020-10-28 ENCOUNTER — Encounter (HOSPITAL_COMMUNITY): Payer: Self-pay | Admitting: Surgery

## 2020-10-28 ENCOUNTER — Inpatient Hospital Stay (HOSPITAL_COMMUNITY): Payer: Medicare Other | Admitting: Anesthesiology

## 2020-10-28 DIAGNOSIS — F419 Anxiety disorder, unspecified: Secondary | ICD-10-CM | POA: Diagnosis not present

## 2020-10-28 DIAGNOSIS — K561 Intussusception: Secondary | ICD-10-CM | POA: Diagnosis not present

## 2020-10-28 DIAGNOSIS — Z885 Allergy status to narcotic agent status: Secondary | ICD-10-CM | POA: Diagnosis not present

## 2020-10-28 DIAGNOSIS — K219 Gastro-esophageal reflux disease without esophagitis: Secondary | ICD-10-CM | POA: Diagnosis present

## 2020-10-28 DIAGNOSIS — Z888 Allergy status to other drugs, medicaments and biological substances status: Secondary | ICD-10-CM | POA: Diagnosis not present

## 2020-10-28 DIAGNOSIS — C189 Malignant neoplasm of colon, unspecified: Secondary | ICD-10-CM | POA: Diagnosis not present

## 2020-10-28 DIAGNOSIS — Z8673 Personal history of transient ischemic attack (TIA), and cerebral infarction without residual deficits: Secondary | ICD-10-CM | POA: Diagnosis not present

## 2020-10-28 DIAGNOSIS — K388 Other specified diseases of appendix: Secondary | ICD-10-CM | POA: Diagnosis not present

## 2020-10-28 DIAGNOSIS — Z823 Family history of stroke: Secondary | ICD-10-CM | POA: Diagnosis not present

## 2020-10-28 DIAGNOSIS — E785 Hyperlipidemia, unspecified: Secondary | ICD-10-CM | POA: Diagnosis not present

## 2020-10-28 DIAGNOSIS — I10 Essential (primary) hypertension: Secondary | ICD-10-CM | POA: Diagnosis present

## 2020-10-28 DIAGNOSIS — Z841 Family history of disorders of kidney and ureter: Secondary | ICD-10-CM | POA: Diagnosis not present

## 2020-10-28 DIAGNOSIS — C772 Secondary and unspecified malignant neoplasm of intra-abdominal lymph nodes: Secondary | ICD-10-CM | POA: Diagnosis not present

## 2020-10-28 DIAGNOSIS — I2510 Atherosclerotic heart disease of native coronary artery without angina pectoris: Secondary | ICD-10-CM | POA: Diagnosis present

## 2020-10-28 DIAGNOSIS — Z87891 Personal history of nicotine dependence: Secondary | ICD-10-CM

## 2020-10-28 DIAGNOSIS — I714 Abdominal aortic aneurysm, without rupture: Secondary | ICD-10-CM | POA: Diagnosis not present

## 2020-10-28 DIAGNOSIS — Z820 Family history of epilepsy and other diseases of the nervous system: Secondary | ICD-10-CM | POA: Diagnosis not present

## 2020-10-28 DIAGNOSIS — Z9049 Acquired absence of other specified parts of digestive tract: Secondary | ICD-10-CM

## 2020-10-28 DIAGNOSIS — Z20822 Contact with and (suspected) exposure to covid-19: Secondary | ICD-10-CM | POA: Diagnosis present

## 2020-10-28 DIAGNOSIS — C182 Malignant neoplasm of ascending colon: Secondary | ICD-10-CM | POA: Diagnosis not present

## 2020-10-28 DIAGNOSIS — Z8249 Family history of ischemic heart disease and other diseases of the circulatory system: Secondary | ICD-10-CM | POA: Diagnosis not present

## 2020-10-28 DIAGNOSIS — E1151 Type 2 diabetes mellitus with diabetic peripheral angiopathy without gangrene: Secondary | ICD-10-CM | POA: Diagnosis not present

## 2020-10-28 DIAGNOSIS — Z951 Presence of aortocoronary bypass graft: Secondary | ICD-10-CM | POA: Diagnosis not present

## 2020-10-28 HISTORY — PX: LAPAROSCOPIC RIGHT HEMI COLECTOMY: SHX5926

## 2020-10-28 LAB — CBC
HCT: 30.7 % — ABNORMAL LOW (ref 36.0–46.0)
Hemoglobin: 9.1 g/dL — ABNORMAL LOW (ref 12.0–15.0)
MCH: 21.7 pg — ABNORMAL LOW (ref 26.0–34.0)
MCHC: 29.6 g/dL — ABNORMAL LOW (ref 30.0–36.0)
MCV: 73.1 fL — ABNORMAL LOW (ref 80.0–100.0)
Platelets: 319 10*3/uL (ref 150–400)
RBC: 4.2 MIL/uL (ref 3.87–5.11)
RDW: 16.8 % — ABNORMAL HIGH (ref 11.5–15.5)
WBC: 5.6 10*3/uL (ref 4.0–10.5)
nRBC: 0 % (ref 0.0–0.2)

## 2020-10-28 LAB — GLUCOSE, CAPILLARY: Glucose-Capillary: 103 mg/dL — ABNORMAL HIGH (ref 70–99)

## 2020-10-28 LAB — TYPE AND SCREEN
ABO/RH(D): O NEG
Antibody Screen: NEGATIVE

## 2020-10-28 SURGERY — LAPAROSCOPIC RIGHT HEMI COLECTOMY
Anesthesia: General | Site: Abdomen | Laterality: Right

## 2020-10-28 MED ORDER — CHLORHEXIDINE GLUCONATE CLOTH 2 % EX PADS: 6.0000 | MEDICATED_PAD | Freq: Once | CUTANEOUS | Status: DC

## 2020-10-28 MED ORDER — MIDAZOLAM HCL 5 MG/5ML IJ SOLN
INTRAMUSCULAR | Status: DC | PRN
  Administered 2020-10-28: .5 mg via INTRAVENOUS

## 2020-10-28 MED ORDER — SIMETHICONE 80 MG PO CHEW: 40.0000 mg | CHEWABLE_TABLET | Freq: Four times a day (QID) | ORAL | Status: DC | PRN

## 2020-10-28 MED ORDER — POLYETHYLENE GLYCOL 3350 17 GM/SCOOP PO POWD: 1.0000 | Freq: Once | ORAL | Status: DC

## 2020-10-28 MED ORDER — METOPROLOL TARTRATE 5 MG/5ML IV SOLN
INTRAVENOUS | Status: AC
  Filled 2020-10-28: qty 5

## 2020-10-28 MED ORDER — CHLORHEXIDINE GLUCONATE 0.12 % MT SOLN
15.0000 mL | Freq: Once | OROMUCOSAL | Status: AC
  Administered 2020-10-28: 14:00:00 15 mL via OROMUCOSAL

## 2020-10-28 MED ORDER — KETAMINE HCL 10 MG/ML IJ SOLN
INTRAMUSCULAR | Status: DC | PRN
  Administered 2020-10-28: 20 mg via INTRAVENOUS

## 2020-10-28 MED ORDER — TRAMADOL HCL 50 MG PO TABS: 50.0000 mg | ORAL_TABLET | Freq: Four times a day (QID) | ORAL | 0 refills | Status: DC | PRN

## 2020-10-28 MED ORDER — NITROGLYCERIN 0.4 MG SL SUBL: 0.4000 mg | SUBLINGUAL_TABLET | SUBLINGUAL | Status: DC | PRN

## 2020-10-28 MED ORDER — ORAL CARE MOUTH RINSE: 15.0000 mL | Freq: Once | OROMUCOSAL | Status: AC

## 2020-10-28 MED ORDER — AMLODIPINE BESYLATE 5 MG PO TABS
5.0000 mg | ORAL_TABLET | Freq: Every day | ORAL | Status: DC
  Administered 2020-10-29 – 2020-10-31 (×3): 5 mg via ORAL
  Filled 2020-10-28 (×4): qty 1

## 2020-10-28 MED ORDER — BUPIVACAINE-EPINEPHRINE (PF) 0.25% -1:200000 IJ SOLN
INTRAMUSCULAR | Status: AC
  Filled 2020-10-28: qty 30

## 2020-10-28 MED ORDER — BISACODYL 5 MG PO TBEC: 20.0000 mg | DELAYED_RELEASE_TABLET | Freq: Once | ORAL | Status: DC

## 2020-10-28 MED ORDER — ACETAMINOPHEN 500 MG PO TABS
1000.0000 mg | ORAL_TABLET | ORAL | Status: AC
  Administered 2020-10-28: 14:00:00 1000 mg via ORAL
  Filled 2020-10-28: qty 2

## 2020-10-28 MED ORDER — ISOSORBIDE MONONITRATE ER 30 MG PO TB24
15.0000 mg | ORAL_TABLET | Freq: Every day | ORAL | Status: DC
  Administered 2020-10-29 – 2020-11-01 (×4): 15 mg via ORAL
  Filled 2020-10-28 (×5): qty 1

## 2020-10-28 MED ORDER — ROSUVASTATIN CALCIUM 20 MG PO TABS
20.0000 mg | ORAL_TABLET | Freq: Every day | ORAL | Status: DC
  Administered 2020-10-28 – 2020-10-31 (×4): 20 mg via ORAL
  Filled 2020-10-28 (×4): qty 1

## 2020-10-28 MED ORDER — DEXAMETHASONE SODIUM PHOSPHATE 10 MG/ML IJ SOLN
INTRAMUSCULAR | Status: AC
  Filled 2020-10-28: qty 1

## 2020-10-28 MED ORDER — PROPOFOL 10 MG/ML IV BOLUS
INTRAVENOUS | Status: DC | PRN
  Administered 2020-10-28: 20 mg via INTRAVENOUS
  Administered 2020-10-28: 50 mg via INTRAVENOUS

## 2020-10-28 MED ORDER — LACTATED RINGERS IR SOLN
Status: DC | PRN
  Administered 2020-10-28: 1000 mL

## 2020-10-28 MED ORDER — CLINDAMYCIN PHOSPHATE 900 MG/50ML IV SOLN
900.0000 mg | INTRAVENOUS | Status: AC
  Administered 2020-10-28: 16:00:00 900 mg via INTRAVENOUS
  Filled 2020-10-28: qty 50

## 2020-10-28 MED ORDER — ALBUMIN HUMAN 5 % IV SOLN
INTRAVENOUS | Status: AC
  Filled 2020-10-28: qty 250

## 2020-10-28 MED ORDER — MIDAZOLAM HCL 2 MG/2ML IJ SOLN
INTRAMUSCULAR | Status: AC
  Filled 2020-10-28: qty 2

## 2020-10-28 MED ORDER — ONDANSETRON HCL 4 MG/2ML IJ SOLN
INTRAMUSCULAR | Status: AC
  Filled 2020-10-28: qty 2

## 2020-10-28 MED ORDER — TRAMADOL HCL 50 MG PO TABS
50.0000 mg | ORAL_TABLET | Freq: Four times a day (QID) | ORAL | Status: DC | PRN
  Administered 2020-10-29 – 2020-10-31 (×4): 50 mg via ORAL
  Filled 2020-10-28 (×6): qty 1

## 2020-10-28 MED ORDER — PHENYLEPHRINE HCL-NACL 10-0.9 MG/250ML-% IV SOLN
INTRAVENOUS | Status: DC | PRN
  Administered 2020-10-28: 25 ug/min via INTRAVENOUS

## 2020-10-28 MED ORDER — OXYCODONE HCL 5 MG/5ML PO SOLN: 5.0000 mg | Freq: Once | ORAL | Status: DC | PRN

## 2020-10-28 MED ORDER — ACETAMINOPHEN 500 MG PO TABS
1000.0000 mg | ORAL_TABLET | Freq: Four times a day (QID) | ORAL | Status: DC
  Administered 2020-10-28 – 2020-11-01 (×11): 1000 mg via ORAL
  Filled 2020-10-28 (×13): qty 2

## 2020-10-28 MED ORDER — METOPROLOL TARTRATE 5 MG/5ML IV SOLN
INTRAVENOUS | Status: DC | PRN
  Administered 2020-10-28 (×3): 1 mg via INTRAVENOUS

## 2020-10-28 MED ORDER — ALVIMOPAN 12 MG PO CAPS
12.0000 mg | ORAL_CAPSULE | Freq: Two times a day (BID) | ORAL | Status: DC
  Administered 2020-10-29 – 2020-10-30 (×3): 12 mg via ORAL
  Filled 2020-10-28 (×3): qty 1

## 2020-10-28 MED ORDER — LACTATED RINGERS IV SOLN: INTRAVENOUS | Status: DC

## 2020-10-28 MED ORDER — FENTANYL CITRATE (PF) 250 MCG/5ML IJ SOLN
INTRAMUSCULAR | Status: AC
  Filled 2020-10-28: qty 5

## 2020-10-28 MED ORDER — PHENYLEPHRINE HCL (PRESSORS) 10 MG/ML IV SOLN
INTRAVENOUS | Status: DC | PRN
  Administered 2020-10-28: 80 ug via INTRAVENOUS

## 2020-10-28 MED ORDER — PHENYLEPHRINE HCL (PRESSORS) 10 MG/ML IV SOLN
INTRAVENOUS | Status: AC
  Filled 2020-10-28: qty 1

## 2020-10-28 MED ORDER — ENSURE SURGERY PO LIQD
237.0000 mL | Freq: Two times a day (BID) | ORAL | Status: DC
  Administered 2020-10-29: 12:00:00 237 mL via ORAL

## 2020-10-28 MED ORDER — ONDANSETRON HCL 4 MG/2ML IJ SOLN
4.0000 mg | Freq: Four times a day (QID) | INTRAMUSCULAR | Status: DC | PRN
  Administered 2020-10-28 – 2020-10-30 (×2): 4 mg via INTRAVENOUS
  Filled 2020-10-28 (×2): qty 2

## 2020-10-28 MED ORDER — BUPIVACAINE-EPINEPHRINE 0.25% -1:200000 IJ SOLN
INTRAMUSCULAR | Status: DC | PRN
  Administered 2020-10-28: 30 mL

## 2020-10-28 MED ORDER — EPHEDRINE 5 MG/ML INJ
INTRAVENOUS | Status: AC
  Filled 2020-10-28: qty 10

## 2020-10-28 MED ORDER — METRONIDAZOLE 500 MG PO TABS: 1000.0000 mg | ORAL_TABLET | ORAL | Status: DC

## 2020-10-28 MED ORDER — ENSURE PRE-SURGERY PO LIQD
592.0000 mL | Freq: Once | ORAL | Status: DC
  Filled 2020-10-28: qty 592

## 2020-10-28 MED ORDER — AMISULPRIDE (ANTIEMETIC) 5 MG/2ML IV SOLN: 10.0000 mg | Freq: Once | INTRAVENOUS | Status: DC | PRN

## 2020-10-28 MED ORDER — HEPARIN SODIUM (PORCINE) 5000 UNIT/ML IJ SOLN
5000.0000 [IU] | Freq: Three times a day (TID) | INTRAMUSCULAR | Status: DC
  Administered 2020-10-29 – 2020-11-01 (×10): 5000 [IU] via SUBCUTANEOUS
  Filled 2020-10-28 (×11): qty 1

## 2020-10-28 MED ORDER — FENTANYL CITRATE (PF) 100 MCG/2ML IJ SOLN
INTRAMUSCULAR | Status: DC | PRN
  Administered 2020-10-28: 50 ug via INTRAVENOUS
  Administered 2020-10-28: 100 ug via INTRAVENOUS
  Administered 2020-10-28: 50 ug via INTRAVENOUS

## 2020-10-28 MED ORDER — PANTOPRAZOLE SODIUM 40 MG PO TBEC
40.0000 mg | DELAYED_RELEASE_TABLET | Freq: Every day | ORAL | Status: DC
  Administered 2020-10-29 – 2020-11-01 (×4): 40 mg via ORAL
  Filled 2020-10-28 (×4): qty 1

## 2020-10-28 MED ORDER — DIPHENHYDRAMINE HCL 50 MG/ML IJ SOLN: 12.5000 mg | Freq: Four times a day (QID) | INTRAMUSCULAR | Status: DC | PRN

## 2020-10-28 MED ORDER — DEXAMETHASONE SODIUM PHOSPHATE 10 MG/ML IJ SOLN
INTRAMUSCULAR | Status: DC | PRN
  Administered 2020-10-28: 5 mg via INTRAVENOUS

## 2020-10-28 MED ORDER — DIPHENHYDRAMINE HCL 12.5 MG/5ML PO ELIX: 12.5000 mg | ORAL_SOLUTION | Freq: Four times a day (QID) | ORAL | Status: DC | PRN

## 2020-10-28 MED ORDER — IBUPROFEN 200 MG PO TABS
400.0000 mg | ORAL_TABLET | Freq: Four times a day (QID) | ORAL | Status: DC | PRN
  Administered 2020-10-30 – 2020-11-01 (×3): 400 mg via ORAL
  Filled 2020-10-28 (×4): qty 2

## 2020-10-28 MED ORDER — ALBUMIN HUMAN 5 % IV SOLN: INTRAVENOUS | Status: DC | PRN

## 2020-10-28 MED ORDER — ALUM & MAG HYDROXIDE-SIMETH 200-200-20 MG/5ML PO SUSP: 30.0000 mL | Freq: Four times a day (QID) | ORAL | Status: DC | PRN

## 2020-10-28 MED ORDER — LIDOCAINE HCL (PF) 2 % IJ SOLN
INTRAMUSCULAR | Status: AC
  Filled 2020-10-28: qty 5

## 2020-10-28 MED ORDER — ONDANSETRON HCL 4 MG/2ML IJ SOLN: 4.0000 mg | Freq: Once | INTRAMUSCULAR | Status: DC | PRN

## 2020-10-28 MED ORDER — ONDANSETRON HCL 4 MG PO TABS: 4.0000 mg | ORAL_TABLET | Freq: Four times a day (QID) | ORAL | Status: DC | PRN

## 2020-10-28 MED ORDER — LIDOCAINE 2% (20 MG/ML) 5 ML SYRINGE
INTRAMUSCULAR | Status: DC | PRN
  Administered 2020-10-28: 50 mg via INTRAVENOUS

## 2020-10-28 MED ORDER — FENTANYL CITRATE (PF) 100 MCG/2ML IJ SOLN: 25.0000 ug | INTRAMUSCULAR | Status: DC | PRN

## 2020-10-28 MED ORDER — OXYCODONE HCL 5 MG PO TABS: 5.0000 mg | ORAL_TABLET | Freq: Once | ORAL | Status: DC | PRN

## 2020-10-28 MED ORDER — HEPARIN SODIUM (PORCINE) 5000 UNIT/ML IJ SOLN
5000.0000 [IU] | Freq: Once | INTRAMUSCULAR | Status: AC
  Administered 2020-10-28: 14:00:00 5000 [IU] via SUBCUTANEOUS
  Filled 2020-10-28: qty 1

## 2020-10-28 MED ORDER — ROCURONIUM BROMIDE 10 MG/ML (PF) SYRINGE
PREFILLED_SYRINGE | INTRAVENOUS | Status: AC
  Filled 2020-10-28: qty 10

## 2020-10-28 MED ORDER — BUPIVACAINE LIPOSOME 1.3 % IJ SUSP
INTRAMUSCULAR | Status: DC | PRN
  Administered 2020-10-28: 20 mL

## 2020-10-28 MED ORDER — SUCCINYLCHOLINE CHLORIDE 200 MG/10ML IV SOSY
PREFILLED_SYRINGE | INTRAVENOUS | Status: AC
  Filled 2020-10-28: qty 10

## 2020-10-28 MED ORDER — NEOMYCIN SULFATE 500 MG PO TABS: 1000.0000 mg | ORAL_TABLET | ORAL | Status: DC

## 2020-10-28 MED ORDER — LABETALOL HCL 5 MG/ML IV SOLN
INTRAVENOUS | Status: AC
  Filled 2020-10-28: qty 4

## 2020-10-28 MED ORDER — ONDANSETRON HCL 4 MG/2ML IJ SOLN
INTRAMUSCULAR | Status: DC | PRN
Start: 2020-10-28 — End: 2020-10-28
  Administered 2020-10-28: 4 mg via INTRAVENOUS

## 2020-10-28 MED ORDER — SUGAMMADEX SODIUM 500 MG/5ML IV SOLN
INTRAVENOUS | Status: AC
  Filled 2020-10-28: qty 5

## 2020-10-28 MED ORDER — ALVIMOPAN 12 MG PO CAPS
12.0000 mg | ORAL_CAPSULE | ORAL | Status: AC
  Administered 2020-10-28: 14:00:00 12 mg via ORAL
  Filled 2020-10-28: qty 1

## 2020-10-28 MED ORDER — 0.9 % SODIUM CHLORIDE (POUR BTL) OPTIME
TOPICAL | Status: DC | PRN
  Administered 2020-10-28: 16:00:00 2000 mL

## 2020-10-28 MED ORDER — LIDOCAINE 20MG/ML (2%) 15 ML SYRINGE OPTIME
INTRAMUSCULAR | Status: DC | PRN
  Administered 2020-10-28: 1 mg/kg/h via INTRAVENOUS

## 2020-10-28 MED ORDER — PROPOFOL 10 MG/ML IV BOLUS
INTRAVENOUS | Status: AC
  Filled 2020-10-28: qty 20

## 2020-10-28 MED ORDER — METOPROLOL TARTRATE 25 MG PO TABS
50.0000 mg | ORAL_TABLET | Freq: Two times a day (BID) | ORAL | Status: DC
  Administered 2020-10-28 – 2020-10-31 (×7): 50 mg via ORAL
  Filled 2020-10-28 (×7): qty 2

## 2020-10-28 MED ORDER — METOPROLOL TARTRATE 25 MG PO TABS
25.0000 mg | ORAL_TABLET | Freq: Every day | ORAL | Status: DC
  Administered 2020-10-29 – 2020-11-01 (×4): 25 mg via ORAL
  Filled 2020-10-28 (×4): qty 1

## 2020-10-28 MED ORDER — PHENYLEPHRINE 40 MCG/ML (10ML) SYRINGE FOR IV PUSH (FOR BLOOD PRESSURE SUPPORT)
PREFILLED_SYRINGE | INTRAVENOUS | Status: AC
  Filled 2020-10-28: qty 10

## 2020-10-28 MED ORDER — ROCURONIUM BROMIDE 10 MG/ML (PF) SYRINGE
PREFILLED_SYRINGE | INTRAVENOUS | Status: DC | PRN
  Administered 2020-10-28: 50 mg via INTRAVENOUS

## 2020-10-28 MED ORDER — ENSURE PRE-SURGERY PO LIQD
296.0000 mL | Freq: Once | ORAL | Status: DC
  Filled 2020-10-28: qty 296

## 2020-10-28 MED ORDER — CLONAZEPAM 0.125 MG PO TBDP
0.2500 mg | ORAL_TABLET | Freq: Every evening | ORAL | Status: DC | PRN
  Administered 2020-10-29 – 2020-10-30 (×2): 0.25 mg via ORAL
  Administered 2020-10-31: 22:00:00 0.125 mg via ORAL
  Filled 2020-10-28 (×3): qty 2

## 2020-10-28 MED FILL — traMADol HCL 50 MG TABS: 50 | 5 days supply | Qty: 15 | Fill #0

## 2020-10-28 SURGICAL SUPPLY — 57 items
APPLIER CLIP ROT 10 11.4 M/L (STAPLE)
BLADE CLIPPER SURG (BLADE) IMPLANT
CABLE HIGH FREQUENCY MONO STRZ (ELECTRODE) ×4 IMPLANT
CELLS DAT CNTRL 66122 CELL SVR (MISCELLANEOUS) IMPLANT
CHLORAPREP W/TINT 26 (MISCELLANEOUS) ×2 IMPLANT
CLIP APPLIE ROT 10 11.4 M/L (STAPLE) IMPLANT
COVER WAND RF STERILE (DRAPES) IMPLANT
DECANTER SPIKE VIAL GLASS SM (MISCELLANEOUS) ×2 IMPLANT
DISSECTOR BLUNT TIP ENDO 5MM (MISCELLANEOUS) IMPLANT
DRSG OPSITE POSTOP 4X6 (GAUZE/BANDAGES/DRESSINGS) ×2 IMPLANT
ELECT REM PT RETURN 15FT ADLT (MISCELLANEOUS) ×2 IMPLANT
GAUZE SPONGE 4X4 12PLY STRL (GAUZE/BANDAGES/DRESSINGS) ×2 IMPLANT
GLOVE BIO SURGEON STRL SZ7.5 (GLOVE) ×4 IMPLANT
GLOVE ECLIPSE 8.0 STRL XLNG CF (GLOVE) ×4 IMPLANT
GOWN STRL REUS W/TWL XL LVL3 (GOWN DISPOSABLE) ×8 IMPLANT
KIT TURNOVER KIT A (KITS) IMPLANT
LIGASURE IMPACT 36 18CM CVD LR (INSTRUMENTS) IMPLANT
NS IRRIG 1000ML POUR BTL (IV SOLUTION) ×2 IMPLANT
PACK COLON (CUSTOM PROCEDURE TRAY) ×2 IMPLANT
PAD POSITIONING PINK XL (MISCELLANEOUS) IMPLANT
PENCIL SMOKE EVACUATOR (MISCELLANEOUS) IMPLANT
PROTECTOR NERVE ULNAR (MISCELLANEOUS) IMPLANT
RELOAD PROXIMATE 75MM BLUE (ENDOMECHANICALS) ×2 IMPLANT
RTRCTR WOUND ALEXIS 18CM MED (MISCELLANEOUS)
SCISSORS LAP 5X35 DISP (ENDOMECHANICALS) ×2 IMPLANT
SEALER TISSUE G2 STRG ARTC 35C (ENDOMECHANICALS) IMPLANT
SET IRRIG TUBING LAPAROSCOPIC (IRRIGATION / IRRIGATOR) IMPLANT
SET TUBE SMOKE EVAC HIGH FLOW (TUBING) ×2 IMPLANT
SHEARS HARMONIC ACE PLUS 36CM (ENDOMECHANICALS) IMPLANT
SLEEVE ADV FIXATION 5X100MM (TROCAR) ×6 IMPLANT
SLEEVE ENDOPATH XCEL 5M (ENDOMECHANICALS) ×2 IMPLANT
STAPLER 90 3.5 STAND SLIM (STAPLE) ×2
STAPLER 90 3.5 STD SLIM (STAPLE) ×1 IMPLANT
STAPLER PROXIMATE 75MM BLUE (STAPLE) ×2 IMPLANT
STAPLER VISISTAT 35W (STAPLE) ×2 IMPLANT
SUT MNCRL AB 4-0 PS2 18 (SUTURE) ×2 IMPLANT
SUT PDS AB 1 CT1 27 (SUTURE) ×4 IMPLANT
SUT PROLENE 2 0 CT2 30 (SUTURE) IMPLANT
SUT PROLENE 2 0 KS (SUTURE) IMPLANT
SUT SILK 2 0 (SUTURE) ×2
SUT SILK 2 0 SH CR/8 (SUTURE) ×2 IMPLANT
SUT SILK 2-0 18XBRD TIE 12 (SUTURE) ×1 IMPLANT
SUT SILK 3 0 (SUTURE) ×2
SUT SILK 3 0 SH CR/8 (SUTURE) ×4 IMPLANT
SUT SILK 3-0 18XBRD TIE 12 (SUTURE) ×1 IMPLANT
SUT VICRYL 0 UR6 27IN ABS (SUTURE) ×2 IMPLANT
SYS LAPSCP GELPORT 120MM (MISCELLANEOUS)
SYSTEM LAPSCP GELPORT 120MM (MISCELLANEOUS) IMPLANT
TAPE CLOTH 4X10 WHT NS (GAUZE/BANDAGES/DRESSINGS) IMPLANT
TOWEL OR 17X26 10 PK STRL BLUE (TOWEL DISPOSABLE) ×2 IMPLANT
TRAY FOLEY MTR SLVR 14FR STAT (SET/KITS/TRAYS/PACK) ×2 IMPLANT
TRAY FOLEY MTR SLVR 16FR STAT (SET/KITS/TRAYS/PACK) IMPLANT
TROCAR ADV FIXATION 5X100MM (TROCAR) ×2 IMPLANT
TROCAR BALLN 12MMX100 BLUNT (TROCAR) ×2 IMPLANT
TROCAR XCEL NON-BLD 11X100MML (ENDOMECHANICALS) IMPLANT
TUBING CONNECTING 10 (TUBING) ×4 IMPLANT
YANKAUER SUCT BULB TIP NO VENT (SUCTIONS) ×2 IMPLANT

## 2020-10-28 NOTE — Transfer of Care (Addendum)
Immediate Anesthesia Transfer of Care Note  Patient: [name]  Procedure(s) Performed: LAPAROSCOPIC RIGHT HEMI COLECTOMY, (Right Abdomen)  Patient Location: PACU  Anesthesia Type:General  Level of Consciousness: awake, alert , oriented and patient cooperative  Airway & Oxygen Therapy: Patient Spontanous Breathing and Patient connected to face mask oxygen  Post-op Assessment: Report given to RN, Post -op Vital signs reviewed and stable and Patient moving all extremities X 4  Post vital signs: stable  Last Vitals:  Vitals Value Taken Time  BP    Temp    Pulse    Resp    SpO2      Last Pain:  Vitals:   10/28/20 1406  TempSrc:   PainSc: 0-No pain      Patients Stated Pain Goal: 3 ([birth year] 4259)  Complications: No complications documented.

## 2020-10-28 NOTE — Discharge Instructions (Signed)
POST OP INSTRUCTIONS AFTER COLON SURGERY  1. DIET: Be sure to include lots of fluids daily to stay hydrated - 64oz of water per day (8, 8 oz glasses).  Avoid fast food or heavy meals for the first couple of weeks as your are more likely to get nauseated. Avoid raw/uncooked fruits or vegetables for the first 4 weeks (its ok to have these if they are blended into smoothie form). If you have fruits/vegetables, make sure they are cooked until soft enough to mash on the roof of your mouth and chew your food well. Otherwise, diet as tolerated.  2. Take your usually prescribed home medications unless otherwise directed.  3. PAIN CONTROL: a. Pain is best controlled by a usual combination of three different methods TOGETHER: i. Ice/Heat ii. Over the counter pain medication iii. Prescription pain medication b. Most patients will experience some swelling and bruising around the surgical site.  Ice packs or heating pads (30-60 minutes up to 6 times a day) will help. Some people prefer to use ice alone, heat alone, alternating between ice & heat.  Experiment to what works for you.  Swelling and bruising can take several weeks to resolve.   c. It is helpful to take an over-the-counter pain medication regularly for the first few weeks: i. Ibuprofen (Motrin/Advil) - 200mg tabs - take 3 tabs (600mg) every 6 hours as needed for pain (unless you have been directed previously to avoid NSAIDs/ibuprofen) ii. Acetaminophen (Tylenol) - you may take 650mg every 6 hours as needed. You can take this with motrin as they act differently on the body. If you are taking a narcotic pain medication that has acetaminophen in it, do not take over the counter tylenol at the same time. iii. NOTE: You may take both of these medications together - most patients  find it most helpful when alternating between the two (i.e. Ibuprofen at 6am, tylenol at 9am, ibuprofen at 12pm ...) d. A  prescription for pain medication should be given to you  upon discharge.  Take your pain medication as prescribed if your pain is not adequatly controlled with the over-the-counter pain reliefs mentioned above.  4. Avoid getting constipated.  Between the surgery and the pain medications, it is common to experience some constipation.  Increasing fluid intake and taking a fiber supplement (such as Metamucil, Citrucel, FiberCon, MiraLax, etc) 1-2 times a day regularly will usually help prevent this problem from occurring.  A mild laxative (prune juice, Milk of Magnesia, MiraLax, etc) should be taken according to package directions if there are no bowel movements after 48 hours.    5. Dressing: Your incisions are covered in Dermabond which is like sterile superglue for the skin. This will come off on it's own in a couple weeks. It is waterproof and you may bathe normally starting the day after your surgery in a shower. Avoid baths/pools/lakes/oceans until your wounds have fully healed.  6. ACTIVITIES as tolerated:   a. Avoid heavy lifting (>10lbs or 1 gallon of milk) for the next 6 weeks. b. You may resume regular daily activities as tolerated--such as daily self-care, walking, climbing stairs--gradually increasing activities as tolerated.  If you can walk 30 minutes without difficulty, it is safe to try more intense activity such as jogging, treadmill, bicycling, low-impact aerobics.  c. DO NOT PUSH THROUGH PAIN.  Let pain be your guide: If it hurts to do something, don't do it. d. You may drive when you are no longer taking prescription pain medication, you   can comfortably wear a seatbelt, and you can safely maneuver your car and apply brakes.  7. FOLLOW UP in our office a. Please call [facility] at [phone] to set up an appointment to see your surgeon in the office for a follow-up appointment approximately 2 weeks after your surgery. b. Make sure that you call for this appointment the day you arrive home to insure a convenient appointment time.  9. If you  have disability or family leave forms that need to be completed, you may have them completed by your primary care physician's office; for return to work instructions, please ask our office staff and they will be happy to assist you in obtaining this documentation   When to call us [phone]: 1. Poor pain control 2. Reactions / problems with new medications (rash/itching, etc)  3. Fever over 101.5 F (38.5 C) 4. Inability to urinate 5. Nausea/vomiting 6. Worsening swelling or bruising 7. Continued bleeding from incision. 8. Increased pain, redness, or drainage from the incision  The clinic staff is available to answer your questions during regular business hours (8:30am-5pm).  Please don't hesitate to call and ask to speak to one of our nurses for clinical concerns.   A surgeon from [facility] is always on call at the hospitals   If you have a medical emergency, go to the nearest emergency room or call 911.  [facility] [address], [address], [place], [place]  [address] MAIN: [phone] FAX: [phone] [email or web address] 

## 2020-10-28 NOTE — H&P (Signed)
CC: Here today for surgery - colon cancer  HPI: [name] is a very pleasant 66yoF with hx of HTN, pre-DM, CAD (s/p CABG 66moago) whom had CT abdomen/pelvis performed by PCP 08/10/20 that showed abnormal appearance of the right colon with a colocolonic intussusception type appearance and thickened wall with abnormally enlarged mesenteric nodes in the right colon mesentery. Appearance was read as being concerning for possible mass/malignancy no evidence of obstruction. This was performed for a multi-month history of some crampy vague abdominal discomforts that are intermittent and recurrent various locations throughout her abdomen including in both upper and lower quadrants. She denies any history of nausea/vomiting or significant abdominal bloating. She denies any blood in her stool. She did have a CABG performed 02/2020 with Dr. GServando Snare With the CT scan results she was referred to our office.   Her last colonoscopy was 4/11/207 with a Dr. WSammuel Cooper She was found to have left-sided diverticulosis. She believes she may have had polyps removed. She is recommended to have a repeat colonoscopy in 2017. She declined to have this performed multiple times as she did not want to go through the procedure.  She denies any abdominal pain currently. She denies any nausea/vomiting. She reports she does have regular bowel movements. She has a temperature history of constipation and take stool softeners for. She does not believe any of her symptoms have changed. She denies fevers/chills.  She subsequently underwent colonoscopy with Dr. JArdis Hughs11/4/21 which demonstrated a large spherical ulcerated partially necrotic tumor in the transverse/right colon. He was unable to traverse the lesion per se. He was able to tattoo distal to this. Biopsies were obtained and demonstrated invasive adenocarcinoma with loss of MLH1 and PMS2. Hypermethylation/BRAF/MSI underway. [facility] [place] evidence of metastatic disease in the chest. She obtained preoperative clearance with an intermediate risk assessment from Dr. MDomenic Polite her cardiologist. She has been doing well. She denies any complaints at this time including abdominal pain, bloating, nausea/vomiting.  PMH: HTN, pre-DM, CAD (s/p CABG 658mogo)  PSH: Hysterectomy - open - 1980s. Denies any other abdominal surgical hx.  FHx: Denies FHx of colorectal, breast, endometrial, ovarian or cervical cancer  Social: Denies use of tobacco/EtOH/drugs.  ROS: A comprehensive 10 system review of systems was completed with the patient and pertinent findings as noted above.  Past Medical History:  Diagnosis Date  . Anemia   . Anxiety   . Arthritis   . CAD (coronary artery disease)    Status post CABG April 2021 - Dr. GeServando Snare. Chronic constipation   . [facility]   Chronic occlusion of the right limb of aortic stent graft.  . Colon cancer ([place]  . Diverticulosis    Left sided noted on colonoscopy 02/2006  . Ectopic atrial tachycardia ([facility]  . Esophageal reflux disease   . Essential hypertension   . Hiatal hernia   . History of TIA (transient ischemic attack)   . Hyperlipidemia   . Lumbar radiculopathy   . Macular degeneration   . Migraines   . Penetrating atherosclerotic ulcer of aorta (HCC)    Complex ulcerated plaque of the infrarenal abdominal aorta mild renal artery stenosis on the right side, normal left renal artery catheterization 2009, status post aortic stent graft  . Renal artery stenosis (HCIgnacio   a. Right renal artery stent April 2013 - Dr. ArFletcher Anon b. 03/2014 PTA/stenting of RRA 2/2 ISR: 6x18  Herculink stent.  . Trigeminal neuralgia    Right  . Type 2 diabetes mellitus ([place])     Past Surgical History:  Procedure Laterality Date  . ABDOMINAL AORTAGRAM N/A 02/13/2012   Procedure: ABDOMINAL Maxcine Ham;  Surgeon: Wellington Hampshire, MD;  Location: [place] CATH LAB;  Service:  Cardiovascular;  Laterality: N/A;  . ABDOMINAL AORTAGRAM N/A 03/17/2014   Procedure: ABDOMINAL Maxcine Ham;  Surgeon: Wellington Hampshire, MD;  Location: [place] CATH LAB;  Service: Cardiovascular;  Laterality: N/A;  . ABDOMINAL HYSTERECTOMY  1982  . BIOPSY  09/15/2020   Procedure: BIOPSY;  Surgeon: [name], MD;  [facility]: [facility];  Service: Endoscopy;;  . BREAST BIOPSY Left 1990's  . CAROTID ENDARTERECTOMY Left 1993  . COLONOSCOPY    . COLONOSCOPY WITH PROPOFOL N/A 09/15/2020   Procedure: COLONOSCOPY WITH PROPOFOL;  Surgeon: [name], MD;  Location: [facility];  Service: Endoscopy;  Laterality: N/A;  . CORONARY ARTERY BYPASS GRAFT N/A 03/02/2020   Procedure: CORONARY ARTERY BYPASS GRAFTING (CABG)x 3, LIMA TO LAD, SVG TO DIAGONAL, SVG TO RCA, WITH OPEN HARVESTING OF LEFT LOWER GREATER SAPHENOUS VEIN AND RIGHT AND LEFT GREATER SAPHENOUS VEIN HARVESTED ENDOSCOPICALLY;  Surgeon: Grace Isaac, MD;  Location: [place];  Service: Open Heart Surgery;  Laterality: N/A;  . Endologix powerlink bifurcated     Covered stent graft  . ENDOVEIN HARVEST OF GREATER SAPHENOUS VEIN Bilateral 03/02/2020   Procedure: [place] Of Greater Saphenous Vein;  Surgeon: Grace Isaac, MD;  Location: [place];  Service: Open Heart Surgery;  Laterality: Bilateral;  . LEFT HEART CATH AND CORONARY ANGIOGRAPHY N/A 01/28/2020   Procedure: LEFT HEART CATH AND CORONARY ANGIOGRAPHY;  Surgeon: Nelva Bush, MD;  Location: [place] CV LAB;  Service: Cardiovascular;  Laterality: N/A;  . LEFT HEART CATHETERIZATION WITH CORONARY ANGIOGRAM N/A 03/17/2014   Procedure: LEFT HEART CATHETERIZATION WITH CORONARY ANGIOGRAM;  Surgeon: Wellington Hampshire, MD;  Location: [place] CATH LAB;  Service: Cardiovascular;  Laterality: N/A;  . LUMBAR [place]  . PERCUTANEOUS STENT INTERVENTION Right 03/17/2014   Procedure: PERCUTANEOUS STENT INTERVENTION;  Surgeon: Wellington Hampshire, MD;  Location: [place] CATH LAB;  Service:  Cardiovascular;  Laterality: Right;  Renal  . RENAL ANGIOGRAM N/A 02/13/2012   Procedure: RENAL ANGIOGRAM;  Surgeon: Wellington Hampshire, MD;  Location: [place] CATH LAB;  Service: Cardiovascular;  Laterality: N/A;  . RENAL ANGIOGRAM N/A 03/17/2014   Procedure: RENAL ANGIOGRAM;  Surgeon: Wellington Hampshire, MD;  Location: [place] CATH LAB;  Service: Cardiovascular;  Laterality: N/A;  . SCLEROTHERAPY  09/15/2020   Procedure: SCLEROTHERAPY;  Surgeon: [name], MD;  [facility];  Service: Endoscopy;;  . TEE WITHOUT CARDIOVERSION N/A 03/02/2020   Procedure: TRANSESOPHAGEAL ECHOCARDIOGRAM (TEE);  Surgeon: Grace Isaac, MD;  Location: [place];  Service: Open Heart Surgery;  Laterality: N/A;  . TONSILLECTOMY AND ADENOIDECTOMY  1947    Family History  Problem Relation Age of Onset  . Heart failure Mother   . Coronary artery disease Mother   . Heart disease Mother   . Hypertension Mother   . Stroke Mother   . Heart attack Father   . Heart disease Father   . Hypertension Father   . Heart disease Brother   . Hypertension Sister   . Parkinson's disease Sister   . Kidney disease Sister   . Stroke Sister   . Hypertension Brother   . Stroke Sister   . Hypertension Sister   .  Breast cancer Neg Hx     Social:  reports that she quit smoking about 40 years ago. Her smoking use included cigarettes. She has a 0.20 pack-year smoking history. She has never used smokeless tobacco. She reports that she does not drink alcohol and does not use drugs.  Allergies:  Allergies  Allergen Reactions  . Cefixime Shortness Of Breath    Patient is not familiar with this listed allergy  . Conjugated Estrogens Other (See Comments)    REACTION: flushing  . Morphine And Related Other (See Comments)    Morphine injection Neck pain  . Codeine Other (See Comments)    REACTION: dizziness  . Iohexol      Desc: pt has anxiety and an irregular heartbeat. contrast exacerbates this. pt was very uncomfortable after a  20cc bolus for an miroi. we did w/o iv 11/09 per dr. Kris Hartmann. pt will talk w/ md about this and will probably not get iv contrast in the future at her re, Onset Date: [id number]   . Adenosine Other (See Comments)    Elevated heart rate when doing stress test    Medications: I have reviewed the patient's current medications.  Results for orders placed or performed during the hospital encounter of 10/28/20 (from the past 48 hour(s))  Glucose, capillary     Status: Abnormal   Collection Time: 10/28/20  1:18 PM  Result Value Ref Range   Glucose-Capillary 103 (H) 70 - 99 mg/dL    Comment: Glucose reference range applies only to samples taken after fasting for at least 8 hours.   Comment 1 Notify RN    Comment 2 Document in Chart   CBC per protocol     Status: Abnormal   Collection Time: 10/28/20  1:49 PM  Result Value Ref Range   WBC 5.6 4.0 - 10.5 K/uL   RBC 4.20 3.87 - 5.11 MIL/uL   Hemoglobin 9.1 (L) 12.0 - 15.0 g/dL   HCT 30.7 (L) 36.0 - 46.0 %   MCV 73.1 (L) 80.0 - 100.0 fL   MCH 21.7 (L) 26.0 - 34.0 pg   MCHC 29.6 (L) 30.0 - 36.0 g/dL   RDW 16.8 (H) 11.5 - 15.5 %   Platelets 319 150 - 400 K/uL   nRBC 0.0 0.0 - 0.2 %    Comment: Performed at [facility], [place] [address], [place], [place] [address]    No results found.  ROS - all of the below systems have been reviewed with the patient and positives are indicated with bold text General: chills, fever or night sweats Eyes: blurry vision or double vision ENT: epistaxis or sore throat Allergy/Immunology: itchy/watery eyes or nasal congestion Hematologic/Lymphatic: bleeding problems, blood clots or swollen lymph nodes Endocrine: temperature intolerance or unexpected weight changes Breast: new or changing breast lumps or nipple discharge Resp: cough, shortness of breath, or wheezing CV: chest pain or dyspnea on exertion GI: as per HPI GU: dysuria, trouble voiding, or hematuria MSK: joint pain or joint  stiffness Neuro: TIA or stroke symptoms Derm: pruritus and skin lesion changes Psych: anxiety and depression  PE Blood pressure (!) 162/76, pulse 86, temperature 97.8 F (36.6 C), temperature source Oral, resp. rate 15, height 5' 1.5" (1.562 m), weight 50.5 kg, SpO2 98 %. Constitutional: NAD; conversant; wearing mask Eyes: Moist conjunctiva; no lid lag; anicteric Lungs: Normal respiratory effort CV: RRR; no pitting edema GI: Abd soft, NT/ND MSK: Normal range of motion of extremities; no clubbing/cyanosis Psychiatric: Appropriate affect; alert  and oriented x3   Results for orders placed or performed during the hospital encounter of 10/28/20 (from the past 48 hour(s))  Glucose, capillary     Status: Abnormal   Collection Time: 10/28/20  1:18 PM  Result Value Ref Range   Glucose-Capillary 103 (H) 70 - 99 mg/dL    Comment: Glucose reference range applies only to samples taken after fasting for at least 8 hours.   Comment 1 Notify RN    Comment 2 Document in Chart   CBC per protocol     Status: Abnormal   Collection Time: 10/28/20  1:49 PM  Result Value Ref Range   WBC 5.6 4.0 - 10.5 K/uL   RBC 4.20 3.87 - 5.11 MIL/uL   Hemoglobin 9.1 (L) 12.0 - 15.0 g/dL   HCT 30.7 (L) 36.0 - 46.0 %   MCV 73.1 (L) 80.0 - 100.0 fL   MCH 21.7 (L) 26.0 - 34.0 pg   MCHC 29.6 (L) 30.0 - 36.0 g/dL   RDW 16.8 (H) 11.5 - 15.5 %   Platelets 319 150 - 400 K/uL   nRBC 0.0 0.0 - 0.2 %    Comment: Performed at [facility], [place] [address] [place], [place] [address]    No results found.   A/P: Ms. Kolodziej is a very pleasant 18yoF with hx of HTN, pre-DM, CAD (s/p CABG 36moago) - found to have mass near hepatic flexure and colo-colonic lead point intussusception 07/2020. Referred to [place].  CT A/P 08/10/20 - mass in right colon with colo-colonic intussusception; no evidence of 'obstruction'  CT Chest 08/2020 - no evidence of metastatic disease in the chest Colonoscopy 09/15/20  - Dr. JArdis Hughs- mass in proximal transverse colon, adenocarcinoma; loss of MLH1/PMS2 - additional testing pending  -Cardiac clearance obtained from Dr. MDomenic Polite- intermediate risk -The anatomy and physiology of the GI tract was discussed at length with the patient. The pathophysiology of colon cancer was discussed as well. Overall treatment options reviewed including things from no further treatment to chemotherapy to surgery - with surgery offering only potential for 'curative intent.' -We have reviewed laparoscopic right hemicolectomy; scenarios where open surgery may be necessary; possible adhesiolysis based on intraoperative findings. -The planned procedures, material risks (including, but not limited to, pain, bleeding, infection, scarring, need for blood transfusion, damage to surrounding structures- blood vessels/nerves/viscus/organs, damage to ureter, urine leak, leak from anastomosis, need for additional procedures, worsening of pre-existing medical conditions, need for stoma which may be permanent, hernia, recurrence, DVT/PE, pneumonia, heart attack, stroke, death) benefits and alternatives to surgery were discussed at length. The patient's and her daughter's questions were answered to their satisfaction, they voiced understanding and elected to proceed with surgery. Additionally, we discussed typical postoperative expectations and the recovery process.  CSharon Mt WDema Severin M.D. [facility] Use [email or web address] to contact on call provider

## 2020-10-28 NOTE — Op Note (Signed)
PATIENT: [name]  84 y.o. female  Patient Care Team: Glenda Chroman, MD as PCP - General (Internal Medicine) [name], MD as PCP - Cardiology (Cardiology) Verner Chol, MD as Consulting Physician (Sports Medicine) Grace Isaac, MD as Consulting Physician (Cardiothoracic Surgery)  PREOP DIAGNOSIS: Colon cancer  POSTOP DIAGNOSIS: Same  PROCEDURE: Laparoscopic right hemicolectomy  SURGEON: Sharon Mt. Wanona Stare, MD  ASSISTANT: Leighton Ruff, MD  ANESTHESIA: General endotracheal  EBL: 25 mL Total I/O In: 1250 [I.V.:1000; IV Piggyback:250] Out: 750 [Urine:700; Blood:50]  DRAINS: None  SPECIMEN: Right colon (including appendix, terminal ileum)  COUNTS: Sponge, needle and instrument counts were reported correct x2  FINDINGS: Mass at proximal transverse colon with tattoo just distal. Normal appearing liver and peritoneal surfaces. Right hemicolectomy carried out including the right branch of the middle colic pedicle.  There is some enlarged lymph nodes along the colon where the mass is located.  All apparent pathologic nodes were included with the specimen.   NARRATIVE:  The patient was identified & brought into the operating room, placed supine on the operating table and SCDs were applied to the lower extremities. General endotracheal anesthesia was induced. The patient was positioned supine with arms tucked.  Pressure points were then evaluated and padded.  Antibiotics were administered. A foley catheter was placed under sterile conditions. Hair in the region of planned surgery was clipped. The abdomen was prepped and draped in a sterile fashion. A timeout was performed confirming our patient and plan.   Beginning with the extraction port, a supraumbilical incision was made and carried down to the midline fascia. This was then incised with electrocautery. The peritoneum was identified and elevated between clamps and carefully opened sharply. A small Alexis  wound protector with a cap and associated port was then placed. The abdomen was insufflated to 15 mmHg with CO2. A laparoscope was placed and camera inspection revealed no evidence of injury. Bilateral TAP blocks were then performed under laparoscopic visualization using a mixture of 0.25% marcaine with epinepherine + Exparel. 3 additional 5 mm ports were then placed under direct laparoscopic visualization - two in the left hemiabdomen and one in the right abdomen. The abdomen was surveyed. The liver and peritoneum appeared normal.  There were no signs of metastatic disease.  Tattoo visualized in the proximal transverse colon and just proximal to this was an evident mass with some mushrooming consistent with the known chronic intussusception.  The patient was positioned in trendelenburg with left side down.  She was relatively thin with a thin associated mesentery.  A lateral to medial approach was therefore selected.  The cecum was grasped and retracted medially.  The terminal ileum was carefully mobilized off of the underlying retroperitoneal structures.  The cecum and ascending colon were then mobilized by incising the Namya Voges line of T[place]t.  This was done all the way up to the level of the hepatic flexure.  The associated mesocolon was reflected medially.  The duodenum was identified and protected free of injury.  This was carefully dissected away from the mesocolon.  She was then repositioned in reverse Trendelenburg.  The transverse colon was retracted caudad.  The omentum was retracted anteriorly.  The lesser sac was gained midway down the transverse colon and worked over towards the hepatic flexure.  The transverse colon and associated mesocolon was fully mobilized.  The hepatic flexure was fully mobilized.  The duodenum was also seen from this approach and protected free of injury.  At this  point, there is more than adequate reach of the appendix into the left upper quadrant.  The base the mesentery would  easily reach up into our extraction site.  Attention was then turned to the extracorporeal portion of the procedure.  Towels were placed around the field.  The specimen was delivered through the wound protector.  The terminal ileum was identified.  A window was created the mesentery and the TI was divided using a linear cutting 75 mm GIA blue load stapler.  A Babcock was placed on the terminal ileum to maintain orientation.  The distal point of transection was then identified.  This was on the midportion of the transverse colon and included the right branch of the middle colic pedicle.  There were some enlarged lymph nodes along the location where the mass was and these would all be well within our planned area of resection.  A window was created the mesentery and the colon was then divided with a 75 mm blue load stapler.  The intervening mesentery including this right branch of the middle colic was then divided using the Enseal device.  The ileocolic pedicle was taken with this device as well.  The mesentery was inspected and noted to be hemostatic.  There was 1 small area of oozing that was subsequently identified that was controlled with 3-0 silk suture.  The specimen was passed off.   Attention was turned to creating the anastomosis. The terminal ileum and transverse colon were inspected for orientation to ensure no twisting nor bowel included in the mesenteric defect. An anastomosis was created between the terminal ileum and the transverse colon using a 75 mm GIA blue load stapler. The staple line was inspected and noted to be hemostatic.  The common enterotomy channel was closed using a TA 90 blue load stapler. Hemostasis was achieved at the staple line using 3-0 silk U-stitches. 3-0 silk sutures were used to imbricate the corners of the staple line as well.  A 2-0 silk suture was placed securing the "crotch" of the anastomosis. The anastomosis was palpated and noted to be widely patent. This was then  placed back into the abdomen and covered with omentum. The abdomen was then irrigated with sterile saline and hemostasis verified. The omentum was then brought down over the anastomosis. The wound protector cap was replaced and CO2 reinsufflated. The laparoscopic ports were removed under direct visualization and the sites noted to be hemostatic. The Alexis wound protector was removed, counts were reported correct, and we switched to clean instruments, gowns and drapes.   The fascia was then closed using two running #1 PDS sutures tied centrally.  The skin of all incision sites was closed with 4-0 monocryl subcuticular suture. Dermabond was placed on the port sites and a sterile dressing was placed over the abdominal incision. All counts were reported correct x2. The patient was then awakened from anesthesia, extubated and transferred to a stretcher for transport to PACU in satisfactory condition.   DISPOSITION: PACU in satisfactory condition

## 2020-10-28 NOTE — Anesthesia Procedure Notes (Signed)
Procedure Name: Intubation Date/Time: 10/28/2020 3:25 PM Performed by: Lissa Morales, CRNA Pre-anesthesia Checklist: Patient identified, Emergency Drugs available, Suction available and Patient being monitored Patient Re-evaluated:Patient Re-evaluated prior to induction Oxygen Delivery Method: Circle system utilized Preoxygenation: Pre-oxygenation with 100% oxygen Induction Type: IV induction Ventilation: Mask ventilation without difficulty Laryngoscope Size: Mac and 4 Grade View: Grade II Tube type: Oral Tube size: 7.0 mm Number of attempts: 1 Airway Equipment and Method: Stylet and Oral airway Placement Confirmation: ETT inserted through vocal cords under direct vision,  positive ETCO2 and breath sounds checked- equal and bilateral Secured at: 21 cm Tube secured with: Tape Dental Injury: Teeth and Oropharynx as per pre-operative assessment

## 2020-10-29 ENCOUNTER — Other Ambulatory Visit: Payer: Self-pay

## 2020-10-29 LAB — BASIC METABOLIC PANEL
Anion gap: 12 (ref 5–15)
BUN: 8 mg/dL (ref 8–23)
CO2: 27 mmol/L (ref 22–32)
Calcium: 9.7 mg/dL (ref 8.9–10.3)
Chloride: 93 mmol/L — ABNORMAL LOW (ref 98–111)
Creatinine, Ser: 0.65 mg/dL (ref 0.44–1.00)
GFR, Estimated: >60 mL/min (ref >60)
Glucose, Bld: 135 mg/dL — ABNORMAL HIGH (ref 70–99)
Potassium: 3 mmol/L — ABNORMAL LOW (ref 3.5–5.1)
Sodium: 132 mmol/L — ABNORMAL LOW (ref 135–145)

## 2020-10-29 LAB — CBC
HCT: 27.7 % — ABNORMAL LOW (ref 36.0–46.0)
Hemoglobin: 8.3 g/dL — ABNORMAL LOW (ref 12.0–15.0)
MCH: 21.3 pg — ABNORMAL LOW (ref 26.0–34.0)
MCHC: 30 g/dL (ref 30.0–36.0)
MCV: 71.2 fL — ABNORMAL LOW (ref 80.0–100.0)
Platelets: 309 10*3/uL (ref 150–400)
RBC: 3.89 MIL/uL (ref 3.87–5.11)
RDW: 16.6 % — ABNORMAL HIGH (ref 11.5–15.5)
WBC: 7.3 10*3/uL (ref 4.0–10.5)
nRBC: 0 % (ref 0.0–0.2)

## 2020-10-29 NOTE — Progress Notes (Signed)
1 Day Post-Op   Subjective/Chief Complaint: Doing well  Does have some gas and shoulder pain but was walking which helped.   Objective: Vital signs in last 24 hours: Temp:  [97.8 F (36.6 C)-99.1 F (37.3 C)] 97.9 F (36.6 C) (12/18 1018) Pulse Rate:  [73-97] 73 (12/18 1018) Resp:  [15-20] 16 (12/18 1018) BP: (133-173)/(58-89) 136/58 (12/18 1018) SpO2:  [97 %-100 %] 99 % (12/18 1018) Weight:  [50.5 kg] 50.5 kg (12/17 1406)    Intake/Output from previous day: 12/17 0701 - 12/18 0700 In: 2546.8 [P.O.:720; I.V.:1576.8; IV Piggyback:250] Out: 2620 [Urine:4000; Blood:50] Intake/Output this shift: No intake/output data recorded.  Incision/Wound: Soft nontender.  Incision clean dry intact with minimal drainage on dressing which is serous.  No rebound or guarding.  Lab Results:  Recent Labs    10/28/20 1349 10/29/20 0524  WBC 5.6 7.3  HGB 9.1* 8.3*  HCT 30.7* 27.7*  PLT 319 309   BMET Recent Labs    10/29/20 0524  NA 132*  K 3.0*  CL 93*  CO2 27  GLUCOSE 135*  BUN 8  CREATININE 0.65  CALCIUM 9.7   PT/INR No results for input(s): LABPROT, INR in the last 72 hours. ABG No results for input(s): PHART, HCO3 in the last 72 hours.  Invalid input(s): PCO2, PO2  Studies/Results: No results found.  Anti-infectives: Anti-infectives (From admission, onward)   Start     Dose/Rate Route Frequency Ordered Stop   10/28/20 1400  neomycin (MYCIFRADIN) tablet 1,000 mg  Status:  Discontinued       "And" Linked Group Details   1,000 mg Oral 3 times per day 10/28/20 1314 10/28/20 1318   10/28/20 1400  metroNIDAZOLE (FLAGYL) tablet 1,000 mg  Status:  Discontinued       "And" Linked Group Details   1,000 mg Oral 3 times per day 10/28/20 1314 10/28/20 1318   10/28/20 1330  clindamycin (CLEOCIN) IVPB 900 mg        900 mg 100 mL/hr over 30 Minutes Intravenous On call to O.R. 10/28/20 1314 10/28/20 1531   10/28/20 0600  gentamicin (GARAMYCIN) 250 mg in dextrose 5 % 100 mL IVPB         5 mg/kg  50.5 kg 106.3 mL/hr over 60 Minutes Intravenous On call to O.R. 10/27/20 1029 10/28/20 1516      Assessment/Plan: s/p Procedure(s): LAPAROSCOPIC RIGHT HEMI COLECTOMY, (Right) Continue ambulation  Continue to follow the postoperative colorectal protocol  Overall looks good and doing well  LOS: 1 day    Marcello Moores A Caius Silbernagel 10/29/2020

## 2020-10-30 LAB — CBC
HCT: 25.6 % — ABNORMAL LOW (ref 36.0–46.0)
Hemoglobin: 7.8 g/dL — ABNORMAL LOW (ref 12.0–15.0)
MCH: 21.9 pg — ABNORMAL LOW (ref 26.0–34.0)
MCHC: 30.5 g/dL (ref 30.0–36.0)
MCV: 71.9 fL — ABNORMAL LOW (ref 80.0–100.0)
Platelets: 332 10*3/uL (ref 150–400)
RBC: 3.56 MIL/uL — ABNORMAL LOW (ref 3.87–5.11)
RDW: 16.5 % — ABNORMAL HIGH (ref 11.5–15.5)
WBC: 6.2 10*3/uL (ref 4.0–10.5)
nRBC: 0 % (ref 0.0–0.2)

## 2020-10-30 LAB — BASIC METABOLIC PANEL
Anion gap: 11 (ref 5–15)
BUN: 11 mg/dL (ref 8–23)
CO2: 23 mmol/L (ref 22–32)
Calcium: 9.4 mg/dL (ref 8.9–10.3)
Chloride: 92 mmol/L — ABNORMAL LOW (ref 98–111)
Creatinine, Ser: 0.84 mg/dL (ref 0.44–1.00)
GFR, Estimated: >60 mL/min (ref >60)
Glucose, Bld: 112 mg/dL — ABNORMAL HIGH (ref 70–99)
Potassium: 3.2 mmol/L — ABNORMAL LOW (ref 3.5–5.1)
Sodium: 126 mmol/L — ABNORMAL LOW (ref 135–145)

## 2020-10-30 NOTE — Progress Notes (Signed)
2 Days Post-Op   Subjective/Chief Complaint: Doing well  Had a bowel movement.  Wants more food   Objective: Vital signs in last 24 hours: Temp:  [97.6 F (36.4 C)-98.4 F (36.9 C)] 98.4 F (36.9 C) (12/19 0454) Pulse Rate:  [63-68] 67 (12/19 0454) Resp:  [16-18] 16 (12/19 0454) BP: (129-155)/(67-75) 129/70 (12/19 0454) SpO2:  [97 %-100 %] 97 % (12/19 0454) Weight:  [49.2 kg] 49.2 kg (12/19 0500)    Intake/Output from previous day: 12/18 0701 - 12/19 0700 In: 240 [P.O.:240] Out: 450 [Urine:450] Intake/Output this shift: No intake/output data recorded.   Incision/Wound: Soft nontender.  Incision clean dry intact with minimal drainage on dressing which is serous.  No rebound or guarding.  Lab Results:  Recent Labs    10/29/20 0524 10/30/20 0517  WBC 7.3 6.2  HGB 8.3* 7.8*  HCT 27.7* 25.6*  PLT 309 332   BMET Recent Labs    10/29/20 0524 10/30/20 0517  NA 132* 126*  K 3.0* 3.2*  CL 93* 92*  CO2 27 23  GLUCOSE 135* 112*  BUN 8 11  CREATININE 0.65 0.84  CALCIUM 9.7 9.4   PT/INR No results for input(s): LABPROT, INR in the last 72 hours. ABG No results for input(s): PHART, HCO3 in the last 72 hours.  Invalid input(s): PCO2, PO2  Studies/Results: No results found.  Anti-infectives: Anti-infectives (From admission, onward)   Start     Dose/Rate Route Frequency Ordered Stop   10/28/20 1400  neomycin (MYCIFRADIN) tablet 1,000 mg  Status:  Discontinued       "And" Linked Group Details   1,000 mg Oral 3 times per day 10/28/20 1314 10/28/20 1318   10/28/20 1400  metroNIDAZOLE (FLAGYL) tablet 1,000 mg  Status:  Discontinued       "And" Linked Group Details   1,000 mg Oral 3 times per day 10/28/20 1314 10/28/20 1318   10/28/20 1330  clindamycin (CLEOCIN) IVPB 900 mg        900 mg 100 mL/hr over 30 Minutes Intravenous On call to O.R. 10/28/20 1314 10/28/20 1531   10/28/20 0600  gentamicin (GARAMYCIN) 250 mg in dextrose 5 % 100 mL IVPB        5 mg/kg   50.5 kg 106.3 mL/hr over 60 Minutes Intravenous On call to O.R. 10/27/20 1029 10/28/20 1516      Assessment/Plan: s/p Procedure(s): LAPAROSCOPIC RIGHT HEMI COLECTOMY, (Right) Advance diet  Ambulate  Home Monday   n LOS: 2 days    Marcello Moores A Jaythan Hinely 10/30/2020

## 2020-10-30 NOTE — Anesthesia Postprocedure Evaluation (Signed)
Anesthesia Post Note  Patient: [name]  Procedure(s) Performed: LAPAROSCOPIC RIGHT HEMI COLECTOMY, (Right Abdomen)     Patient location during evaluation: PACU Anesthesia Type: General Level of consciousness: awake and alert Pain management: pain level controlled Vital Signs Assessment: post-procedure vital signs reviewed and stable Respiratory status: spontaneous breathing, nonlabored ventilation, respiratory function stable and patient connected to nasal cannula oxygen Cardiovascular status: blood pressure returned to baseline and stable Postop Assessment: no apparent nausea or vomiting Anesthetic complications: no   No complications documented.  Last Vitals:  Vitals:   10/29/20 2118 10/30/20 0454  BP: (!) 151/75 129/70  Pulse: 64 67  Resp: 18 16  Temp: 36.8 C 36.9 C  SpO2: 99% 97%    Last Pain:  Vitals:   10/30/20 0454  TempSrc: Oral  PainSc:                  Lidia Collum

## 2020-10-30 NOTE — Progress Notes (Signed)
Pharmacy Brief Note - Alvimopan (Entereg)  The standing order set for alvimopan (Entereg) now includes an automatic order to discontinue the drug after the patient has had a bowel movement. The change was approved by the [place] and the Medical Executive Committee.   This patient has had bowel movements documented by nursing. Therefore, alvimopan has been discontinued. If there are questions, please contact the pharmacy at [phone]  Thank you-  Peggyann Juba, PharmD, BCPS 10/30/2020 10:52 AM

## 2020-10-31 ENCOUNTER — Encounter (HOSPITAL_COMMUNITY): Payer: Self-pay | Admitting: Surgery

## 2020-10-31 LAB — BASIC METABOLIC PANEL
Anion gap: 8 (ref 5–15)
BUN: 15 mg/dL (ref 8–23)
CO2: 28 mmol/L (ref 22–32)
Calcium: 9.3 mg/dL (ref 8.9–10.3)
Chloride: 92 mmol/L — ABNORMAL LOW (ref 98–111)
Creatinine, Ser: 0.96 mg/dL (ref 0.44–1.00)
GFR, Estimated: 59 mL/min — ABNORMAL LOW (ref >60)
Glucose, Bld: 94 mg/dL (ref 70–99)
Potassium: 3.2 mmol/L — ABNORMAL LOW (ref 3.5–5.1)
Sodium: 128 mmol/L — ABNORMAL LOW (ref 135–145)

## 2020-10-31 LAB — CBC
HCT: 26.3 % — ABNORMAL LOW (ref 36.0–46.0)
Hemoglobin: 7.9 g/dL — ABNORMAL LOW (ref 12.0–15.0)
MCH: 21.4 pg — ABNORMAL LOW (ref 26.0–34.0)
MCHC: 30 g/dL (ref 30.0–36.0)
MCV: 71.1 fL — ABNORMAL LOW (ref 80.0–100.0)
Platelets: 331 10*3/uL (ref 150–400)
RBC: 3.7 MIL/uL — ABNORMAL LOW (ref 3.87–5.11)
RDW: 16.3 % — ABNORMAL HIGH (ref 11.5–15.5)
WBC: 5.8 10*3/uL (ref 4.0–10.5)
nRBC: 0 % (ref 0.0–0.2)

## 2020-10-31 MED ORDER — SODIUM CHLORIDE 0.9 % IV SOLN: INTRAVENOUS | Status: DC

## 2020-10-31 MED ORDER — POTASSIUM CHLORIDE CRYS ER 20 MEQ PO TBCR
40.0000 meq | EXTENDED_RELEASE_TABLET | Freq: Once | ORAL | Status: AC
  Administered 2020-10-31: 10:00:00 40 meq via ORAL
  Filled 2020-10-31: qty 2

## 2020-10-31 NOTE — Care Management Important Message (Signed)
Important Message  Patient Details IM Letter given to the Patient. Name: [name] MRN: [id number] Date of Birth: [birth year]   Medicare Important Message Given:  Yes     Kerin Salen 10/31/2020, 3:20 PM

## 2020-10-31 NOTE — Evaluation (Signed)
Physical Therapy Evaluation Patient Details Name: [name] MRN: [id number] DOB: 1936/04/15 Today's Date: 10/31/2020   History of Present Illness  67yoF with hx of HTN, pre-DM, CAD (s/p CABG 37mo ago) whom had CT abdomen/pelvis performed by PCP 08/10/20 that showed abnormal appearance of the right colon with a colocolonic intussusception type appearance and thickened wall with abnormally enlarged mesenteric nodes in the right colon mesentery.  Clinical Impression  Patient evaluated by Physical Therapy with no further acute PT needs identified. All education has been completed and the patient has no further questions.  [name] is doing well. She is amb on her own in the hallway with the iv pole without difficulty. She walked with me without IV pole support, mild drifting however no overt LOB. She is not having difficulty with bed mobility which sometimes warrants the need for continued PT, however pt is mod I.   See below for any follow-up Physical Therapy or equipment needs. PT is signing off. Thank you for this referral.     Follow Up Recommendations No PT follow up    Equipment Recommendations  None recommended by PT    Recommendations for Other Services       Precautions / Restrictions Restrictions Weight Bearing Restrictions: No      Mobility  Bed Mobility Overal bed mobility: Needs Assistance Bed Mobility: Supine to Sit;Sit to Supine     Supine to sit: Supervision;Modified independent (Device/Increase time) Sit to supine: Supervision;Modified independent (Device/Increase time)   General bed mobility comments: for line management safety, pt managing her own lines end of session without difficulty    Transfers   Equipment used: None Transfers: Sit to/from Stand Sit to Stand: Supervision;Modified independent (Device/Increase time)         General transfer comment: for safety initially  Ambulation/Gait Ambulation/Gait assistance: Supervision Gait Distance  (Feet): 250 Feet Assistive device: None Gait Pattern/deviations: Step-through pattern;Drifts right/left     General Gait Details: slight drifting, no overt LOB  Stairs            Wheelchair Mobility    Modified Rankin (Stroke Patients Only)       Balance Overall balance assessment: Needs assistance   Sitting balance-Leahy Scale: Good       Standing balance-Leahy Scale: Fair Standing balance comment: not tested to mod perturbations for Good rating             High level balance activites: Backward walking;Direction changes;Turns;Head turns High Level Balance Comments: slight unsteadiness, drifting with head turns, no LOB             Pertinent Vitals/Pain Pain Assessment: Faces Pain Score: 4  Faces Pain Scale: Hurts a little bit Pain Location: abd Pain Descriptors / Indicators: Discomfort;Sore Pain Intervention(s): Limited activity within patient's tolerance;Monitored during session    [name] expects to be discharged to:: Private residence Living Arrangements: Alone Available Help at Discharge: Family;Available PRN/intermittently Type of Home: House Home Access: Stairs to enter   [employer] of Steps: 4 Home Layout: One level Home Equipment: Walker - 2 wheels      Prior Function Level of Independence: Independent               Hand Dominance        Extremity/Trunk Assessment   Upper Extremity Assessment Upper Extremity Assessment: Overall WFL for tasks assessed    Lower Extremity Assessment Lower Extremity Assessment: Overall WFL for tasks assessed       Communication   Communication:  No difficulties  Cognition Arousal/Alertness: Awake/alert Behavior During Therapy: WFL for tasks assessed/performed Overall Cognitive Status: Within Functional Limits for tasks assessed                                        General Comments      Exercises     Assessment/Plan    PT  Assessment Patent does not need any further PT services  PT Problem List         PT Treatment Interventions      PT Goals (Current goals can be found in the Care Plan section)  Acute Rehab PT Goals Patient Stated Goal: home soon PT Goal Formulation: All assessment and education complete, DC therapy    Frequency     Barriers to discharge        Co-evaluation               AM-PAC PT "6 Clicks" Mobility  Outcome Measure Help needed turning from your back to your side while in a flat bed without using bedrails?: None Help needed moving from lying on your back to sitting on the side of a flat bed without using bedrails?: None Help needed moving to and from a bed to a chair (including a wheelchair)?: None Help needed standing up from a chair using your arms (e.g., wheelchair or bedside chair)?: None Help needed to walk in hospital room?: None Help needed climbing 3-5 steps with a railing? : A Little 6 Click Score: 23    End of Session   Activity Tolerance: Patient tolerated treatment well Patient left: in bed;with call bell/phone within reach   PT Visit Diagnosis: Unsteadiness on feet (R26.81)    Time: 4235-3614 PT Time Calculation (min) (ACUTE ONLY): 18 min   Charges:   PT Evaluation $PT Eval Low Complexity: [place] [address] [place], PT  Acute Rehab Dept ([place]) [phone] Pager [phone]  10/31/2020   [facility] 10/31/2020, 5:14 PM

## 2020-10-31 NOTE — Progress Notes (Addendum)
Subjective No acute events. Tolerating soft diet without n/v. Denies flatus/BM yet. Soreness but controlled.  Objective: Vital signs in last 24 hours: Temp:  [97.6 F (36.4 C)-98.3 F (36.8 C)] 97.6 F (36.4 C) (12/20 0500) Pulse Rate:  [62-72] 62 (12/20 0500) Resp:  [14-18] 16 (12/20 0500) BP: (114-159)/(57-82) 116/57 (12/20 0500) SpO2:  [98 %-100 %] 98 % (12/20 0500) Last BM Date: 10/28/20  Intake/Output from previous day: 12/19 0701 - 12/20 0700 In: 240 [P.O.:240] Out: 500 [Urine:500] Intake/Output this shift: No intake/output data recorded.  Gen: NAD, comfortable CV: RRR Pulm: Normal work of breathing Abd: Soft, very soft, nondistended; incisions c/d/i without erythema or drainage Ext: SCDs in place  Lab Results: CBC  Recent Labs    10/30/20 0517 10/31/20 0442  WBC 6.2 5.8  HGB 7.8* 7.9*  HCT 25.6* 26.3*  PLT 332 331   BMET Recent Labs    10/30/20 0517 10/31/20 0442  NA 126* 128*  K 3.2* 3.2*  CL 92* 92*  CO2 23 28  GLUCOSE 112* 94  BUN 11 15  CREATININE 0.84 0.96  CALCIUM 9.4 9.3   PT/INR No results for input(s): LABPROT, INR in the last 72 hours. ABG No results for input(s): PHART, HCO3 in the last 72 hours.  Invalid input(s): PCO2, PO2  Studies/Results:  Anti-infectives: Anti-infectives (From admission, onward)   Start     Dose/Rate Route Frequency Ordered Stop   10/28/20 1400  neomycin (MYCIFRADIN) tablet 1,000 mg  Status:  Discontinued       "And" Linked Group Details   1,000 mg Oral 3 times per day 10/28/20 1314 10/28/20 1318   10/28/20 1400  metroNIDAZOLE (FLAGYL) tablet 1,000 mg  Status:  Discontinued       "And" Linked Group Details   1,000 mg Oral 3 times per day 10/28/20 1314 10/28/20 1318   10/28/20 1330  clindamycin (CLEOCIN) IVPB 900 mg        900 mg 100 mL/hr over 30 Minutes Intravenous On call to O.R. 10/28/20 1314 10/28/20 1531   10/28/20 0600  gentamicin (GARAMYCIN) 250 mg in dextrose 5 % 100 mL IVPB        5 mg/kg   50.5 kg 106.3 mL/hr over 60 Minutes Intravenous On call to O.R. 10/27/20 1029 10/28/20 1516       Assessment/Plan: Patient Active Problem List   Diagnosis Date Noted  . S/P right hemicolectomy 10/28/2020  . Coronary artery disease 03/02/2020  . S/P CABG (coronary artery bypass graft) 03/02/2020  . Intermediate stage nonexudative age-related macular degeneration of both eyes 02/16/2020  . Serous detachment of retinal pigment epithelium of left eye 02/16/2020  . Pseudophakia 02/16/2020  . Posterior vitreous detachment of both eyes 02/16/2020  . Accelerating angina ([place]) 01/28/2020  . Headache 02/07/2016  . RAS (renal artery stenosis) ([place]) 03/17/2014  . AAA (abdominal aortic aneurysm) ([place]) 11/28/2012  . Atherosclerosis of renal artery ([place]) 11/28/2012  . Observation for suspected cardiovascular disease 11/28/2012  . Essential (primary) hypertension 11/28/2012  . Peripheral blood vessel disorder ([place]) 11/28/2012  . Renal artery stenosis ([place])   . Essential hypertension, benign   . Claudication ([place])   . Penetrating atherosclerotic ulcer of aorta ([place])   . Carotid artery disease ([place]) 02/27/2011  . Arterial disease ([place]) 02/27/2011  . DYSLIPIDEMIA 04/03/2010  . HLD (hyperlipidemia) 04/03/2010  . Atherosclerosis of native arteries of extremity with intermittent claudication ([place]) 08/29/2009  . CAD (coronary artery disease), native coronary artery 09/14/2008  . Ectopic atrial tachycardia (  [place]) 09/14/2008  . Benign hypertensive heart disease 09/14/2008  . Chronic tension type headache 09/14/2008  . Generalized anxiety disorder 09/14/2008  . Fast heart beat 09/14/2008   s/p Procedure(s): LAPAROSCOPIC RIGHT HEMI COLECTOMY, 10/28/2020  -Recovering well; she has NOT had BM since surgery or flatus by her account; entereg was discontinued yesterday however -Hypokalemia - replace with 40 mEq PO Kdur -Hyponatremia, mild but improving, NS @ 50 cc/hr -PPx: SQH, SCDs -Dispo: Possible  discharge tomorrow pending bowel function; she is requesting home health aide which given her surgery and age seems appropriate to me as well - TOC consult placed for this   LOS: 3 days   Sharon Mt. Dema Severin, M.D. [facility] Use [email or web address] to contact on call provider

## 2020-11-01 LAB — SURGICAL PATHOLOGY

## 2020-11-01 NOTE — Progress Notes (Signed)
Subjective No acute events. Tolerating soft diet without n/v. Soreness resolved. No abdominal pain at present. Having flatus and BMs  Objective: Vital signs in last 24 hours: Temp:  [98.3 F (36.8 C)-98.8 F (37.1 C)] 98.8 F (37.1 C) (12/21 0655) Pulse Rate:  [66-79] 79 (12/21 0655) Resp:  [18] 18 (12/21 0655) BP: (114-132)/(56-64) 132/64 (12/21 0655) SpO2:  [98 %] 98 % (12/21 0655) Last BM Date: 10/31/20  Intake/Output from previous day: 12/20 0701 - 12/21 0700 In: 1911.2 [P.O.:960; I.V.:951.2] Out: 500 [Urine:500] Intake/Output this shift: No intake/output data recorded.  Gen: NAD, comfortable CV: RRR Pulm: Normal work of breathing Abd: Soft, remains very soft, nondistended; incisions c/d/i without erythema or drainage Ext: SCDs in place  Lab Results: CBC  Recent Labs    10/30/20 0517 10/31/20 0442  WBC 6.2 5.8  HGB 7.8* 7.9*  HCT 25.6* 26.3*  PLT 332 331   BMET Recent Labs    10/30/20 0517 10/31/20 0442  NA 126* 128*  K 3.2* 3.2*  CL 92* 92*  CO2 23 28  GLUCOSE 112* 94  BUN 11 15  CREATININE 0.84 0.96  CALCIUM 9.4 9.3   PT/INR No results for input(s): LABPROT, INR in the last 72 hours. ABG No results for input(s): PHART, HCO3 in the last 72 hours.  Invalid input(s): PCO2, PO2  Studies/Results:  Anti-infectives: Anti-infectives (From admission, onward)   Start     Dose/Rate Route Frequency Ordered Stop   10/28/20 1400  neomycin (MYCIFRADIN) tablet 1,000 mg  Status:  Discontinued       "And" Linked Group Details   1,000 mg Oral 3 times per day 10/28/20 1314 10/28/20 1318   10/28/20 1400  metroNIDAZOLE (FLAGYL) tablet 1,000 mg  Status:  Discontinued       "And" Linked Group Details   1,000 mg Oral 3 times per day 10/28/20 1314 10/28/20 1318   10/28/20 1330  clindamycin (CLEOCIN) IVPB 900 mg        900 mg 100 mL/hr over 30 Minutes Intravenous On call to O.R. 10/28/20 1314 10/28/20 1531   10/28/20 0600  gentamicin (GARAMYCIN) 250 mg in dextrose  5 % 100 mL IVPB        5 mg/kg  50.5 kg 106.3 mL/hr over 60 Minutes Intravenous On call to O.R. 10/27/20 1029 10/28/20 1516       Assessment/Plan: Patient Active Problem List   Diagnosis Date Noted  . S/P right hemicolectomy 10/28/2020  . Coronary artery disease 03/02/2020  . S/P CABG (coronary artery bypass graft) 03/02/2020  . Intermediate stage nonexudative age-related macular degeneration of both eyes 02/16/2020  . Serous detachment of retinal pigment epithelium of left eye 02/16/2020  . Pseudophakia 02/16/2020  . Posterior vitreous detachment of both eyes 02/16/2020  . Accelerating angina ([place]) 01/28/2020  . Headache 02/07/2016  . RAS (renal artery stenosis) ([place]) 03/17/2014  . AAA (abdominal aortic aneurysm) ([place]) 11/28/2012  . Atherosclerosis of renal artery ([place]) 11/28/2012  . Observation for suspected cardiovascular disease 11/28/2012  . Essential (primary) hypertension 11/28/2012  . Peripheral blood vessel disorder ([place]) 11/28/2012  . Renal artery stenosis ([place])   . Essential hypertension, benign   . Claudication ([place])   . Penetrating atherosclerotic ulcer of aorta ([place])   . Carotid artery disease ([place]) 02/27/2011  . Arterial disease ([place]) 02/27/2011  . DYSLIPIDEMIA 04/03/2010  . HLD (hyperlipidemia) 04/03/2010  . Atherosclerosis of native arteries of extremity with intermittent claudication ([place]) 08/29/2009  . CAD (coronary artery disease), native coronary artery  09/14/2008  . Ectopic atrial tachycardia ([place]) 09/14/2008  . Benign hypertensive heart disease 09/14/2008  . Chronic tension type headache 09/14/2008  . Generalized anxiety disorder 09/14/2008  . Fast heart beat 09/14/2008   s/p Procedure(s): LAPAROSCOPIC RIGHT HEMI COLECTOMY, 10/28/2020  -Recovering well with return of bowel function -Comfortable with and stable for discahrge home today -PPx: SQH, SCDs -Dispo: She worked with PT yesterday and states she does not want home health aides at this time.  She is comfortable caring for herself at this point.   LOS: 4 days   Sharon Mt. Dema Severin, M.D. [facility] Use [email or web address] to contact on call provider

## 2020-11-01 NOTE — Progress Notes (Signed)
D/C instructions given, denies questions. IV removed. Instructed not to submerge incisions in water and to come back with any s/s of infection. Educated on s/s of infection.

## 2020-11-01 NOTE — Discharge Summary (Signed)
Patient ID: [name] MRN: [id number] DOB/AGE: 05-26-1936 84 y.o.  Admit date: 10/28/2020 Discharge date: 11/01/2020  Discharge Diagnoses Patient Active Problem List   Diagnosis Date Noted  . S/P right hemicolectomy 10/28/2020  . Coronary artery disease 03/02/2020  . S/P CABG (coronary artery bypass graft) 03/02/2020  . Intermediate stage nonexudative age-related macular degeneration of both eyes 02/16/2020  . Serous detachment of retinal pigment epithelium of left eye 02/16/2020  . Pseudophakia 02/16/2020  . Posterior vitreous detachment of both eyes 02/16/2020  . Accelerating angina ([place]) 01/28/2020  . Headache 02/07/2016  . RAS (renal artery stenosis) ([place]) 03/17/2014  . AAA (abdominal aortic aneurysm) ([place]) 11/28/2012  . Atherosclerosis of renal artery ([place]) 11/28/2012  . Observation for suspected cardiovascular disease 11/28/2012  . Essential (primary) hypertension 11/28/2012  . Peripheral blood vessel disorder ([place]) 11/28/2012  . Renal artery stenosis ([place])   . Essential hypertension, benign   . Claudication ([place])   . Penetrating atherosclerotic ulcer of aorta ([place])   . Carotid artery disease ([place]) 02/27/2011  . Arterial disease ([place]) 02/27/2011  . DYSLIPIDEMIA 04/03/2010  . HLD (hyperlipidemia) 04/03/2010  . Atherosclerosis of native arteries of extremity with intermittent claudication ([place]) 08/29/2009  . CAD (coronary artery disease), native coronary artery 09/14/2008  . Ectopic atrial tachycardia ([place]) 09/14/2008  . Benign hypertensive heart disease 09/14/2008  . Chronic tension type headache 09/14/2008  . Generalized anxiety disorder 09/14/2008  . Fast heart beat 09/14/2008    Procedures OR 10/28/20 - laparoscopic right hemicolectomy  Hospital Course: She was admitted postoperatively and recovered appropriately. On 11/01/20, she was having spontaneous bowel function, tolerating soft diet without difficulty, ambulating, and pain well controlled on oral  analgesics. She is comfortable with and stable for discharge home. Postoperative expectations have been reviewed and all of her questions answered. Follow-up is being arranged.    Allergies as of 11/01/2020      Reactions   Cefixime Shortness Of Breath   Patient is not familiar with this listed allergy   Conjugated Estrogens Other (See Comments)   REACTION: flushing   Morphine And Related Other (See Comments)   Morphine injection Neck pain   Codeine Other (See Comments)   REACTION: dizziness   Iohexol     Desc: pt has anxiety and an irregular heartbeat. contrast exacerbates this. pt was very uncomfortable after a 20cc bolus for an miroi. we did w/o iv 11/09 per dr. Kris Hartmann. pt will talk w/ md about this and will probably not get iv contrast in the future at her re, Onset Date: [id number]   Adenosine Other (See Comments)   Elevated heart rate when doing stress test      Medication List    STOP taking these medications   metroNIDAZOLE 500 MG tablet Commonly known as: FLAGYL   neomycin 500 MG tablet Commonly known as: MYCIFRADIN     TAKE these medications   acetaminophen 500 MG tablet Commonly known as: TYLENOL Take 500 mg by mouth every 6 (six) hours as needed for mild pain or moderate pain.   amLODipine 5 MG tablet Commonly known as: NORVASC Take 1 tablet (5 mg total) by mouth at bedtime.   aspirin 325 MG EC tablet Take 1 tablet (325 mg total) by mouth daily.   clonazePAM 0.5 MG tablet Commonly known as: KLONOPIN Take 0.25 mg by mouth at bedtime.   docusate sodium 100 MG capsule Commonly known as: COLACE Take 100 mg by mouth at bedtime.   isosorbide mononitrate 30 MG 24  hr tablet Commonly known as: IMDUR Take 0.5 tablets (15 mg total) by mouth daily. What changed: when to take this   metoprolol tartrate 50 MG tablet Commonly known as: LOPRESSOR Take 0.5 tablets (25 mg total) by mouth in the morning. & 50 mg at lunch and in the evening What changed:   how much  to take  when to take this  additional instructions   nitroGLYCERIN 0.4 MG SL tablet Commonly known as: NITROSTAT Place 1 tablet (0.4 mg total) under the tongue every 5 (five) minutes as needed for chest pain. MORE THAN 2 DOSES GO TO ED What changed: when to take this   pantoprazole 40 MG tablet Commonly known as: PROTONIX Take 40 mg by mouth daily as needed (indigestion/heartburn.).   PRESERVISION AREDS 2 PO Take 1 tablet by mouth in the morning and at bedtime.   PROBIOTIC ACIDOPHILUS PO Take 1 tablet by mouth daily with lunch.   rosuvastatin 20 MG tablet Commonly known as: CRESTOR Take 1 tablet (20 mg total) by mouth at bedtime.   Suprep Bowel Prep Kit 17.5-3.13-1.6 GM/177ML Soln Generic drug: Na Sulfate-K Sulfate-Mg Sulf Take 1 kit by mouth as directed.   traMADol 50 MG tablet Commonly known as: Ultram Take 1 tablet (50 mg total) by mouth every 6 (six) hours as needed for up to 5 days (severe pain not controlled with tylenol/ibuprofen first).   Vitamin D3 25 MCG (1000 UT) Caps Take 1,000 Units by mouth daily.         Follow-up Information    Ileana Roup, MD Follow up in 2 week(s).   Specialty: General Surgery Contact information: [place] [address] [phone]               Miriam Kestler M. Dema Severin, M.D. [facility]

## 2020-11-01 NOTE — Evaluation (Signed)
Occupational Therapy Evaluation Patient Details Name: [name] MRN: [id number] DOB: 04-16-1936 Today's Date: 11/01/2020    History of Present Illness 80yoF with hx of HTN, pre-DM, CAD (s/p CABG 66mo ago) whom had CT abdomen/pelvis performed by PCP 08/10/20 that showed abnormal appearance of the right colon with a colocolonic intussusception type appearance and thickened wall with abnormally enlarged mesenteric nodes in the right colon mesentery.   Clinical Impression   Patient evaluated by Occupational Therapy with no further acute OT needs identified. All education has been completed and the patient has no further questions.  See below for any follow-up Occupational Therapy or equipment needs. OT is signing off. Thank you for this referral.     Follow Up Recommendations  No OT follow up    Equipment Recommendations       Recommendations for Other Services       Precautions / Restrictions Precautions Precautions: None Restrictions Weight Bearing Restrictions: No      Mobility Bed Mobility               General bed mobility comments: Pt up in recliner.    Transfers Overall transfer level: Modified independent   Transfers: Sit to/from Stand Sit to Stand: Modified independent (Device/Increase time)         General transfer comment: Increased time due to incisional/abdominal pain, but no assistance.  Pt completed 30 Second Sit to Stand test with score of 10 indicating a very low fall risk.    Balance Overall balance assessment: Independent           Standing balance-Leahy Scale: Good Standing balance comment: Good per 30 Second Sit to Stand test score of 10 based on pt's age and gender.                           ADL either performed or assessed with clinical judgement   ADL Overall ADL's : Independent;At baseline;Modified independent                                             Vision   Vision Assessment?: No  apparent visual deficits     Perception     Praxis      Pertinent Vitals/Pain Pain Assessment: 0-10 Pain Score: 5  Pain Location: Incisional area near waist band of pt's pants.  Recommended loose fitting clothing or loose overhead dresses and pt reports that she has these at home and will change asap. Pain Descriptors / Indicators: Discomfort;Sore Pain Intervention(s): Limited activity within patient's tolerance;Monitored during session     Hand Dominance Right   Extremity/Trunk Assessment Upper Extremity Assessment Upper Extremity Assessment: Overall WFL for tasks assessed   Lower Extremity Assessment Lower Extremity Assessment: Overall WFL for tasks assessed   Cervical / Trunk Assessment Cervical / Trunk Assessment: Normal   Communication Communication Communication: No difficulties   Cognition Arousal/Alertness: Awake/alert Behavior During Therapy: WFL for tasks assessed/performed Overall Cognitive Status: Within Functional Limits for tasks assessed                                 General Comments: Pleasant and cooperative.   General Comments       Exercises  Pt educated on kitchen safety with lifting limitation post-op. Recommended family setting commonly used  household items within easy reach in kitchen to minimize unnecessary bending, possible use of long reacher, fall prevention strategies at home.    Shoulder Instructions      Home Living Family/patient expects to be discharged to:: Private residence Living Arrangements: Alone Available Help at Discharge: Family;Available PRN/intermittently (Daughter PRN) Type of Home: House Home Access: Stairs to enter [employer] of Steps: 4 Entrance Stairs-Rails: Right Home Layout: One level     Bathroom Shower/Tub: Occupational psychologist: Standard     Home Equipment: Environmental consultant - 2 wheels;Walker - standard          Prior Functioning/Environment Level of Independence:  Independent                 OT Problem List:        OT Treatment/Interventions:      OT Goals(Current goals can be found in the care plan section) Acute Rehab OT Goals Patient Stated Goal: Home today OT Goal Formulation: With patient Potential to Achieve Goals: Good  OT Frequency:     Barriers to D/C:            Co-evaluation              AM-PAC OT "6 Clicks" Daily Activity     Outcome Measure Help from another person eating meals?: None Help from another person taking care of personal grooming?: None Help from another person toileting, which includes using toliet, bedpan, or urinal?: None Help from another person bathing (including washing, rinsing, drying)?: None Help from another person to put on and taking off regular upper body clothing?: None Help from another person to put on and taking off regular lower body clothing?: None 6 Click Score: 24   End of Session    Activity Tolerance: Patient tolerated treatment well Patient left: in chair  OT Visit Diagnosis: Pain Pain - part of body:  (abdomen)                Time: 1030-1045 OT Time Calculation (min): 15 min Charges:  OT General Charges $OT Visit: 1 Visit OT Evaluation $OT Eval Low Complexity: 1 Low  Atiya Yera, OT Acute Rehab Services Office: [phone] 11/01/2020  Julien Girt 11/01/2020, 1:02 PM

## 2020-11-02 ENCOUNTER — Telehealth: Payer: Self-pay

## 2020-11-02 NOTE — Telephone Encounter (Signed)
Chart review for Tumor Board

## 2020-11-07 ENCOUNTER — Other Ambulatory Visit: Payer: Self-pay | Admitting: *Deleted

## 2020-11-07 NOTE — Patient Outreach (Signed)
[facility]) Care Management  11/07/2020  [name] Aug 02, 1936 [id number]   EMMI Red Flag Alert General   Day:4 Date : 11/06/20 Reason : Scheduled follow up ? No   Inpatient Hospital Admission at [facility] 12/17-12/21/21 Dx: Right hemicolectomy   Outreach attempt #1 Subjective: Successful outreach call to patient , explaining reason for the call EMMI automatic call , she recalls receiving.  Addressed EMMI Red Alert : Scheduled follow up, patient reporting scheduling post discharge visit with surgeon on 11/19/19.  Patient reports that she has doing good after surgery. She reports tolerating diet, slowly appetite and intake improving.She states her incision area looks good, no signs of redness. She reports that her pain is managed well.  She states that he bowel are moving.  She reports having support from her daughter that visit daily. Patient reports completing ADL's, simple meals. Her daughter will assist with transportation  Patient denies any other concerns at this time.   Plan Will plan case closure , EMMI red alert addressed no other care management needs identified.    Joylene Draft, RN, BSN  [facility] Management Coordinator  [phone]- Mobile [phone]- Toll Free Main Office

## 2020-11-09 ENCOUNTER — Other Ambulatory Visit: Payer: Self-pay

## 2020-11-09 NOTE — Progress Notes (Signed)
The proposed treatment discussed in conference is for discussion purposes only and is not a binding recommendation.  The patients have not been physically examined, or presented with their treatment options.  Therefore, final treatment plans cannot be decided.   Message has been sent to Dr. Ellin Saba and his navigator regarding Atomic Study.

## 2020-11-10 DIAGNOSIS — I10 Essential (primary) hypertension: Secondary | ICD-10-CM | POA: Diagnosis not present

## 2020-11-16 DIAGNOSIS — C189 Malignant neoplasm of colon, unspecified: Secondary | ICD-10-CM | POA: Diagnosis not present

## 2020-11-16 DIAGNOSIS — I10 Essential (primary) hypertension: Secondary | ICD-10-CM | POA: Diagnosis not present

## 2020-11-16 DIAGNOSIS — G4700 Insomnia, unspecified: Secondary | ICD-10-CM | POA: Diagnosis not present

## 2020-11-16 DIAGNOSIS — Z87891 Personal history of nicotine dependence: Secondary | ICD-10-CM | POA: Diagnosis not present

## 2020-11-16 DIAGNOSIS — E1165 Type 2 diabetes mellitus with hyperglycemia: Secondary | ICD-10-CM | POA: Diagnosis not present

## 2020-11-16 DIAGNOSIS — Z299 Encounter for prophylactic measures, unspecified: Secondary | ICD-10-CM | POA: Diagnosis not present

## 2020-11-22 ENCOUNTER — Inpatient Hospital Stay (HOSPITAL_COMMUNITY): Payer: Medicare Other | Attending: Hematology | Admitting: Hematology

## 2020-11-22 ENCOUNTER — Encounter (HOSPITAL_COMMUNITY): Payer: Self-pay | Admitting: Hematology

## 2020-11-22 ENCOUNTER — Other Ambulatory Visit: Payer: Self-pay

## 2020-11-22 ENCOUNTER — Encounter (HOSPITAL_COMMUNITY): Payer: Self-pay

## 2020-11-22 ENCOUNTER — Inpatient Hospital Stay (HOSPITAL_COMMUNITY): Payer: Medicare Other

## 2020-11-22 VITALS — BP 161/65 | HR 86 | Temp 97.2°F | Resp 17 | Ht 61.5 in | Wt 110.8 lb

## 2020-11-22 DIAGNOSIS — D509 Iron deficiency anemia, unspecified: Secondary | ICD-10-CM | POA: Diagnosis not present

## 2020-11-22 DIAGNOSIS — C182 Malignant neoplasm of ascending colon: Secondary | ICD-10-CM | POA: Insufficient documentation

## 2020-11-22 LAB — CBC WITH DIFFERENTIAL/PLATELET
Abs Immature Granulocytes: 0.02 10*3/uL (ref 0.00–0.07)
Basophils Absolute: 0 10*3/uL (ref 0.0–0.1)
Basophils Relative: 0 %
Eosinophils Absolute: 0.1 10*3/uL (ref 0.0–0.5)
Eosinophils Relative: 3 %
HCT: 27.5 % — ABNORMAL LOW (ref 36.0–46.0)
Hemoglobin: 7.8 g/dL — ABNORMAL LOW (ref 12.0–15.0)
Immature Granulocytes: 0 %
Lymphocytes Relative: 20 %
Lymphs Abs: 0.9 10*3/uL (ref 0.7–4.0)
MCH: 21.1 pg — ABNORMAL LOW (ref 26.0–34.0)
MCHC: 28.4 g/dL — ABNORMAL LOW (ref 30.0–36.0)
MCV: 74.5 fL — ABNORMAL LOW (ref 80.0–100.0)
Monocytes Absolute: 0.3 10*3/uL (ref 0.1–1.0)
Monocytes Relative: 7 %
Neutro Abs: 3.3 10*3/uL (ref 1.7–7.7)
Neutrophils Relative %: 70 %
Platelets: 259 10*3/uL (ref 150–400)
RBC: 3.69 MIL/uL — ABNORMAL LOW (ref 3.87–5.11)
RDW: 17.3 % — ABNORMAL HIGH (ref 11.5–15.5)
WBC: 4.7 10*3/uL (ref 4.0–10.5)
nRBC: 0 % (ref 0.0–0.2)

## 2020-11-22 LAB — COMPREHENSIVE METABOLIC PANEL
ALT: 18 U/L (ref 0–44)
AST: 20 U/L (ref 15–41)
Albumin: 4 g/dL (ref 3.5–5.0)
Alkaline Phosphatase: 94 U/L (ref 38–126)
Anion gap: 5 (ref 5–15)
BUN: 22 mg/dL (ref 8–23)
CO2: 27 mmol/L (ref 22–32)
Calcium: 9.6 mg/dL (ref 8.9–10.3)
Chloride: 104 mmol/L (ref 98–111)
Creatinine, Ser: 0.87 mg/dL (ref 0.44–1.00)
GFR, Estimated: >60 mL/min (ref >60)
Glucose, Bld: 125 mg/dL — ABNORMAL HIGH (ref 70–99)
Potassium: 3.8 mmol/L (ref 3.5–5.1)
Sodium: 136 mmol/L (ref 135–145)
Total Bilirubin: 0.4 mg/dL (ref 0.3–1.2)
Total Protein: 6.7 g/dL (ref 6.5–8.1)

## 2020-11-22 LAB — IRON AND TIBC
Iron: 16 ug/dL — ABNORMAL LOW (ref 28–170)
Saturation Ratios: 3 % — ABNORMAL LOW (ref 10.4–31.8)
TIBC: 558 ug/dL — ABNORMAL HIGH (ref 250–450)
UIBC: 542 ug/dL

## 2020-11-22 LAB — VITAMIN B12: Vitamin B-12: 461 pg/mL (ref 180–914)

## 2020-11-22 LAB — FERRITIN: Ferritin: 11 ng/mL (ref 11–307)

## 2020-11-22 LAB — FOLATE: Folate: 10.9 ng/mL (ref >5.9)

## 2020-11-22 LAB — MAGNESIUM: Magnesium: 2.2 mg/dL (ref 1.7–2.4)

## 2020-11-22 NOTE — Progress Notes (Signed)
I met with patient during initial visit with Dr. Delton Coombes today. I introduced myself and explained my role in the patient's care. I provided my contact information and encouraged the patient and daughter to call with any questions or concerns. I have also provided the patient and her daughter with written information of proposed treatment regimens as discussed by Dr. Delton Coombes.

## 2020-11-22 NOTE — Patient Instructions (Signed)
[facility] Discharge Instructions  You were seen and examined today by Dr. Delton Coombes. Dr. Delton Coombes is a medical oncologist, meaning he specializes in the treatment of cancer diagnoses with medication. Dr. Delton Coombes discussed your past medical history, family history of cancer and the events that led to you being here today.  You have been diagnosed with Stage IIIC Colon Cancer, which was removed during surgery. This is because the cancer has spread to your lymph nodes. This increases your risk of the cancer recurring.   Typically, chemotherapy treatment is recommended for at least 6 months to prevent the cancer from recurring. The typical treatment is FOLFOX (5FU and Oxaliplatin), it is given once every 14 days for 12 treatment cycles. This would require a Port-A-Cath placement. The 5FU is given through an ambulatory pump over about 48 hours, the Oxaliplatin is given in the clinic. Typical side effects include decreased blood counts, fatigue, nausea, vomiting, diarrhea, cold sensitivity, peripheral neuropathy (neuropathy has a 80-90% occurrence rate). There is a rare occurrence of hand/foot skin reaction.   The 5FU could be given in the form of a pill that you would take twice daily for 14 days on/7 days off. The pill has a decreased incidence of nausea and vomiting but can still cause diarrhea and hand/foot skin reaction.  Treatment would need to be for at least 6 months (12 cycles no matter what).  There is a clinical trial available that adds immunotherapy to FOLFOX. Immunotherapy tells your body to recognize cancer and respond to it. Clinical trials do give random patients placebos.  Without treatment, cancer has a 70% chance of cancer recurrence. With or without treatment, we would check your lab work (tumor marker) every 3 months and a CT of your abdomen and pelvis every 6 months for at least two years.  Dr. Delton Coombes has recommended a baseline tumor  marker today. Your hemoglobin was also low, so we will recheck that along with your iron levels. We will also repeat a CT of your abdomen and pelvis so that we can establish a post-surgical baseline.  We will meet with you in about 2 weeks to discuss labs, imaging and your chosen treatment path.  Please begin taking a daily iron supplement. This can be bought over the counter. Take an over the counter stool softener should you experience constipation related to the iron supplements.   Thank you for choosing [facility] at [facility] to provide your oncology and hematology care.  To afford each patient quality time with our provider, please arrive at least 15 minutes before your scheduled appointment time.   If you have a lab appointment with the [facility] please come in thru the Main Entrance and check in at the main information desk.  You need to re-schedule your appointment should you arrive 10 or more minutes late.  We strive to give you quality time with our providers, and arriving late affects you and other patients whose appointments are after yours.  Also, if you no show three or more times for appointments you may be dismissed from the clinic at the providers discretion.     Again, thank you for choosing [facility].  Our hope is that these requests will decrease the amount of time that you wait before being seen by our physicians.       _____________________________________________________________  Should you have questions after your visit to [facility], please contact our  office at [phone] and follow the prompts.  Our office hours are 8:00 a.m. and 4:30 p.m. Monday - Friday.  Please note that voicemails left after 4:00 p.m. may not be returned until the following business day.  We are closed weekends and major holidays.  You do have access to a nurse 24-7, just call the main number to the clinic [phone] and do not press any  options, hold on the line and a nurse will answer the phone.    For prescription refill requests, have your pharmacy contact our office and allow 72 hours.    Due to Covid, you will need to wear a mask upon entering the hospital. If you do not have a mask, a mask will be given to you at the Main Entrance upon arrival. For doctor visits, patients may have 1 support person age 71 or older with them. For treatment visits, patients can not have anyone with them due to social distancing guidelines and our immunocompromised population.

## 2020-11-22 NOTE — Progress Notes (Signed)
[facility] [address] [place] [address]   CLINIC:  Medical Oncology/Hematology  CONSULT NOTE  Patient Care Team: Glenda Chroman, MD as PCP - General (Internal Medicine) [name], MD as PCP - Cardiology (Cardiology) Verner Chol, MD as Consulting Physician (Sports Medicine) Grace Isaac, MD as Consulting Physician (Cardiothoracic Surgery)  CHIEF COMPLAINTS/PURPOSE OF CONSULTATION:  Evaluation of adenocarcinoma of ascending colon  HISTORY OF PRESENTING ILLNESS:  [name] 85 y.o. female is here because of evaluation of adenocarcinoma of ascending colon, at the request of Dr. Nadeen Landau from [facility]. She had a laparoscopic right hemicolectomy on 10/28/2020 with Dr. Nadeen Landau. She also had a triple CABG on 02/2020 with Dr. Servando Snare.  Today she is accompanied by [name], her daughter, and she reports feeling okay. She denies prior history of cancer. She has controlled her DM with her diabetes for about 20 years; she reports having some intermittent numbness in her right index and middle and left index fingers since her colon surgery when it is cold. She denies having balance issues or problem with her gait. She reports that she started having pain in her abdomen since fall of 2020 whenever she ate and was initially placed on antibiotics, but then a CT scan was done. Her appetite has improved since her surgery. She was never started on iron tablets and she never took iron tablets prior. She has a history of TIA, but no CVA. She is open to undergoing chemo for her treatment.  Her maternal aunt had ovarian cancer. She lives at home by herself and she does all of her chores and ADL's and continue to drive. She used to work as a Environmental manager.  MEDICAL HISTORY:  Past Medical History:  Diagnosis Date  . Anemia   . Anxiety   . Arthritis   . CAD (coronary artery disease)    Status post CABG April 2021  - Dr. Servando Snare  . Chronic constipation   . Claudication [facility])    Chronic occlusion of the right limb of aortic stent graft.  . Colon cancer ([place])   . Diverticulosis    Left sided noted on colonoscopy 02/2006  . Ectopic atrial tachycardia ([place])   . Esophageal reflux disease   . Essential hypertension   . Hiatal hernia   . History of TIA (transient ischemic attack)   . Hyperlipidemia   . Lumbar radiculopathy   . Macular degeneration   . Migraines   . Penetrating atherosclerotic ulcer of aorta ([facility])    Complex ulcerated plaque of the infrarenal abdominal aorta mild renal artery stenosis on the right side, normal left renal artery catheterization 2009, status post aortic stent graft  . Renal artery stenosis ([place])    a. Right renal artery stent April 2013 - Dr. Fletcher Anon;  b. 03/2014 PTA/stenting of RRA 2/2 ISR: 6x18 Herculink stent.  . Trigeminal neuralgia    Right  . Type 2 diabetes mellitus ([place])     SURGICAL HISTORY: Past Surgical History:  Procedure Laterality Date  . ABDOMINAL AORTAGRAM N/A 02/13/2012   Procedure: ABDOMINAL Maxcine Ham;  Surgeon: Wellington Hampshire, MD;  Location: [place] CATH LAB;  Service: Cardiovascular;  Laterality: N/A;  . ABDOMINAL AORTAGRAM N/A 03/17/2014   Procedure: ABDOMINAL Maxcine Ham;  Surgeon: Wellington Hampshire, MD;  Location: [place] CATH LAB;  Service: Cardiovascular;  Laterality: N/A;  . ABDOMINAL HYSTERECTOMY  1982  . BIOPSY  09/15/2020   Procedure: BIOPSY;  Surgeon: [name], MD;  Location: [facility];  Service: Endoscopy;;  . BREAST BIOPSY Left 1990's  . CAROTID ENDARTERECTOMY Left 1993  . COLONOSCOPY    . COLONOSCOPY WITH PROPOFOL N/A 09/15/2020   Procedure: COLONOSCOPY WITH PROPOFOL;  Surgeon: [name], MD;  [facility]: [facility];  Service: Endoscopy;  Laterality: N/A;  . CORONARY ARTERY BYPASS GRAFT N/A 03/02/2020   Procedure: CORONARY ARTERY BYPASS GRAFTING (CABG)x 3, LIMA TO LAD, SVG TO DIAGONAL, SVG TO RCA, WITH OPEN HARVESTING OF LEFT LOWER  GREATER SAPHENOUS VEIN AND RIGHT AND LEFT GREATER SAPHENOUS VEIN HARVESTED ENDOSCOPICALLY;  Surgeon: Grace Isaac, MD;  Location: [facility];  Service: Open Heart Surgery;  Laterality: N/A;  . Endologix powerlink bifurcated     Covered stent graft  . ENDOVEIN HARVEST OF GREATER SAPHENOUS VEIN Bilateral 03/02/2020   Procedure: [place] Of Greater Saphenous Vein;  Surgeon: Grace Isaac, MD;  Location: [place];  Service: Open Heart Surgery;  Laterality: Bilateral;  . LAPAROSCOPIC RIGHT HEMI COLECTOMY Right 10/28/2020   Procedure: LAPAROSCOPIC RIGHT HEMI COLECTOMY,;  Surgeon: Ileana Roup, MD;  [facility];  Service: General;  Laterality: Right;  . LEFT HEART CATH AND CORONARY ANGIOGRAPHY N/A 01/28/2020   Procedure: LEFT HEART CATH AND CORONARY ANGIOGRAPHY;  Surgeon: Nelva Bush, MD;  Location: [place] CV LAB;  Service: Cardiovascular;  Laterality: N/A;  . LEFT HEART CATHETERIZATION WITH CORONARY ANGIOGRAM N/A 03/17/2014   Procedure: LEFT HEART CATHETERIZATION WITH CORONARY ANGIOGRAM;  Surgeon: Wellington Hampshire, MD;  Location: [place] CATH LAB;  Service: Cardiovascular;  Laterality: N/A;  . LUMBAR [place]  . PERCUTANEOUS STENT INTERVENTION Right 03/17/2014   Procedure: PERCUTANEOUS STENT INTERVENTION;  Surgeon: Wellington Hampshire, MD;  Location: [place] CATH LAB;  Service: Cardiovascular;  Laterality: Right;  Renal  . RENAL ANGIOGRAM N/A 02/13/2012   Procedure: RENAL ANGIOGRAM;  Surgeon: Wellington Hampshire, MD;  Location: [place] CATH LAB;  Service: Cardiovascular;  Laterality: N/A;  . RENAL ANGIOGRAM N/A 03/17/2014   Procedure: RENAL ANGIOGRAM;  Surgeon: Wellington Hampshire, MD;  Location: [place] CATH LAB;  Service: Cardiovascular;  Laterality: N/A;  . SCLEROTHERAPY  09/15/2020   Procedure: SCLEROTHERAPY;  Surgeon: [name], MD;  [facility];  Service: Endoscopy;;  . TEE WITHOUT CARDIOVERSION N/A 03/02/2020   Procedure: TRANSESOPHAGEAL ECHOCARDIOGRAM (TEE);  Surgeon:  Grace Isaac, MD;  Location: [facility];  Service: Open Heart Surgery;  Laterality: N/A;  . TONSILLECTOMY AND ADENOIDECTOMY  1947    SOCIAL HISTORY: Social History   Socioeconomic History  . Marital status: Widowed    Spouse name: Not on file  . Number of children: 1  . Years of education: 43  . Highest education level: Not on file  Occupational History  . Occupation: Retired    Comment: Scientist, clinical (histocompatibility and immunogenetics) at [employer]  . Smoking status: Former Smoker    Packs/day: 0.20    Years: 1.00    Pack years: 0.20    Types: Cigarettes    Quit date: 11/13/1979    Years since quitting: 41.0  . Smokeless tobacco: Never Used  . Tobacco comment: "smoked a few months"  Vaping Use  . Vaping Use: Never used  Substance and Sexual Activity  . Alcohol use: No    Alcohol/week: 0.0 standard drinks  . Drug use: Never  . Sexual activity: Not on file    Comment: Hysterectomy  Other Topics Concern  . Not on file  Social History Narrative  Drinks 1/2 caf coffee 3-4 cups a day    Social Determinants of Health   Financial Resource Strain: Low Risk   . Difficulty of Paying Living Expenses: Not hard at all  Food Insecurity: No Food Insecurity  . Worried About Charity fundraiser in the Last Year: Never true  . Ran Out of Food in the Last Year: Never true  Transportation Needs: No Transportation Needs  . Lack of Transportation (Medical): No  . Lack of Transportation (Non-Medical): No  Physical Activity: Inactive  . Days of Exercise per Week: 0 days  . Minutes of Exercise per Session: 0 min  Stress: Stress Concern Present  . Feeling of Stress : To some extent  Social Connections: Moderately Isolated  . Frequency of Communication with Friends and Family: Three times a week  . Frequency of Social Gatherings with Friends and Family: Three times a week  . Attends Religious Services: More than 4 times per year  . Active Member of Clubs or Organizations: No  . Attends Archivist  Meetings: Never  . Marital Status: Widowed  Intimate Partner Violence: Not At Risk  . Fear of Current or Ex-Partner: No  . Emotionally Abused: No  . Physically Abused: No  . Sexually Abused: No    FAMILY HISTORY: Family History  Problem Relation Age of Onset  . Heart failure Mother   . Coronary artery disease Mother   . Heart disease Mother   . Hypertension Mother   . Stroke Mother   . Heart attack Father   . Heart disease Father   . Hypertension Father   . Heart disease Brother   . Hypertension Sister   . Parkinson's disease Sister   . Kidney disease Sister   . Stroke Sister   . Hypertension Brother   . Stroke Sister   . Hypertension Sister   . Breast cancer Neg Hx     ALLERGIES:  is allergic to cefixime, conjugated estrogens, morphine and related, codeine, iohexol, and adenosine.  MEDICATIONS:  Current Outpatient Medications  Medication Sig Dispense Refill  . acetaminophen (TYLENOL) 500 MG tablet Take 500 mg by mouth every 6 (six) hours as needed for mild pain or moderate pain.     Marland Kitchen amLODipine (NORVASC) 5 MG tablet Take 1 tablet (5 mg total) by mouth at bedtime. 90 tablet 3  . aspirin EC 325 MG EC tablet Take 1 tablet (325 mg total) by mouth daily. 30 tablet 0  . Cholecalciferol (VITAMIN D3) 1000 UNITS CAPS Take 1,000 Units by mouth daily.     . clonazePAM (KLONOPIN) 0.5 MG tablet Take 0.25 mg by mouth at bedtime.    . docusate sodium (COLACE) 100 MG capsule Take 100 mg by mouth at bedtime.     . isosorbide mononitrate (IMDUR) 30 MG 24 hr tablet Take 0.5 tablets (15 mg total) by mouth daily. (Patient taking differently: Take 15 mg by mouth daily at 8 pm.) 45 tablet 1  . Lactobacillus (PROBIOTIC ACIDOPHILUS PO) Take 1 tablet by mouth daily with lunch.     . metoprolol tartrate (LOPRESSOR) 50 MG tablet Take 0.5 tablets (25 mg total) by mouth in the morning. & 50 mg at lunch and in the evening (Patient taking differently: Take 25-50 mg by mouth See admin instructions.  Take 0.5 tablet (25 mg) by mouth in the morning, take 1 tablet (50 mg) by mouth at 1500 & take 1 tablet (50 mg) by mouth at bedtime.) 135 tablet 1  . Multiple  Vitamins-Minerals (PRESERVISION AREDS 2 PO) Take 1 tablet by mouth in the morning and at bedtime.    . Na Sulfate-K Sulfate-Mg Sulf (SUPREP BOWEL PREP KIT) 17.5-3.13-1.6 GM/177ML SOLN Take 1 kit by mouth as directed. 324 mL 0  . nitroGLYCERIN (NITROSTAT) 0.4 MG SL tablet Place 1 tablet (0.4 mg total) under the tongue every 5 (five) minutes as needed for chest pain. MORE THAN 2 DOSES GO TO ED (Patient taking differently: Place 0.4 mg under the tongue every 5 (five) minutes x 3 doses as needed for chest pain. MORE THAN 2 DOSES GO TO ED) 25 tablet 3  . pantoprazole (PROTONIX) 40 MG tablet Take 40 mg by mouth daily as needed (indigestion/heartburn.).     Marland Kitchen rosuvastatin (CRESTOR) 20 MG tablet Take 1 tablet (20 mg total) by mouth at bedtime. 90 tablet 3   No current facility-administered medications for this visit.    REVIEW OF SYSTEMS:   Review of Systems  Constitutional: Positive for appetite change (75%) and fatigue (25%).  Respiratory: Positive for shortness of breath (w/ exertion).   Cardiovascular: Positive for chest pain (intermittent), leg swelling (feet swelling) and palpitations.  Musculoskeletal: Negative for gait problem.  Neurological: Positive for headaches and numbness (intermittent in index & middle of R hand, index of L hand). Negative for gait problem.  Psychiatric/Behavioral: The patient is nervous/anxious.   All other systems reviewed and are negative.    PHYSICAL EXAMINATION: ECOG PERFORMANCE STATUS: 1 - Symptomatic but completely ambulatory  Vitals:   11/22/20 1338  BP: (!) 161/65  Pulse: 86  Resp: 17  Temp: (!) 97.2 F (36.2 C)  SpO2: 100%   Filed Weights   11/22/20 1338  Weight: 110 lb 12.8 oz (50.3 kg)   Physical Exam Vitals reviewed.  Constitutional:      Appearance: Normal appearance.   Cardiovascular:     Rate and Rhythm: Normal rate and regular rhythm.     Pulses: Normal pulses.     Heart sounds: Normal heart sounds.  Pulmonary:     Effort: Pulmonary effort is normal.     Breath sounds: Normal breath sounds.  Chest:  Breasts:     Right: No supraclavicular adenopathy.     Left: No supraclavicular adenopathy.    Abdominal:     Palpations: Abdomen is soft. There is no hepatomegaly, splenomegaly or mass.     Tenderness: There is abdominal tenderness (over midline incision).     Hernia: No hernia is present.  Musculoskeletal:     Right lower leg: Edema (trace) present.     Left lower leg: Edema (trace) present.  Lymphadenopathy:     Cervical: No cervical adenopathy.     Upper Body:     Right upper body: No supraclavicular adenopathy.     Left upper body: No supraclavicular adenopathy.     Lower Body: No right inguinal adenopathy. No left inguinal adenopathy.  Neurological:     General: No focal deficit present.     Mental Status: She is alert and oriented to person, place, and time.  Psychiatric:        Mood and Affect: Mood normal.        Behavior: Behavior normal.      LABORATORY DATA:  I have reviewed the data as listed CBC Latest Ref Rng & Units 10/31/2020 10/30/2020 10/29/2020  WBC 4.0 - 10.5 K/uL 5.8 6.2 7.3  Hemoglobin 12.0 - 15.0 g/dL 7.9(L) 7.8(L) 8.3(L)  Hematocrit 36.0 - 46.0 % 26.3(L) 25.6(L) 27.7(L)  Platelets  150 - 400 K/uL 331 332 309   CMP Latest Ref Rng & Units 10/31/2020 10/30/2020 10/29/2020  Glucose 70 - 99 mg/dL 94 112(H) 135(H)  BUN 8 - 23 mg/dL 15 11 8   Creatinine 0.44 - 1.00 mg/dL 0.96 0.84 0.65  Sodium 135 - 145 mmol/L 128(L) 126(L) 132(L)  Potassium 3.5 - 5.1 mmol/L 3.2(L) 3.2(L) 3.0(L)  Chloride 98 - 111 mmol/L 92(L) 92(L) 93(L)  CO2 22 - 32 mmol/L 28 23 27   Calcium 8.9 - 10.3 mg/dL 9.3 9.4 9.7  Total Protein 6.5 - 8.1 g/dL - - -  Total Bilirubin 0.3 - 1.2 mg/dL - - -  Alkaline Phos 38 - 126 U/L - - -  AST 15 - 41 U/L  - - -  ALT 0 - 44 U/L - - -   Surgical pathology ([id number]) on 10/28/2020: Right colon colectomy: invasive moderate to poorly differentiated adenocarcinoma, spanning 10 cm.  RADIOGRAPHIC STUDIES: I have personally reviewed the radiological images as listed and agreed with the findings in the report. No results found.  ASSESSMENT:  1.  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 - CT AP with contrast on 08/10/2020 with abnormal appearance of right colon with colocolonic intussusception with wall thickening and abnormally enlarged adjacent right mesenteric lymph nodes. - CT chest with contrast on 09/07/2020 with no evidence of metastatic disease in the chest. -Colonoscopy on 09/15/2020-large spherical, ulcerated, partially necrotic tumor in the transverse/right colon.  Scope could not be maneuvered more proximally to the tumor. - Right hemicolectomy on 10/28/2020 by Dr. Dema Severin. - Pathology shows invasive moderately to poorly differentiated carcinoma involving the mid ascending colon, spanning 10 cm, tumor invades through muscularis propria into pericolorectal tissue, margins negative.  9/39 lymph nodes involved.  Lymphovascular invasion present.  PT3PN2B. - Loss of MLH1 and PMS2. - BRAF V600 E mutation positive.  MSI-high.  2.  Social/family history: - She is accompanied by her daughter today.  Lives by herself and is independent of ADLs and IADLs.  Worked as a Catering manager. - Maternal aunt had ovarian cancer.  No other malignancies.   PLAN:  1.  Stage IIIc (PT3PN2B) adenocarcinoma of ascending colon: - Discussed pathology report in detail. - Also discussed prognosis of stage III colon cancer.  Discussed high incidence of recurrence given involvement of 9 lymph nodes. - As her presurgical scans were done in September at outside hospital, I have recommended restaging CT scan of the abdomen and pelvis. - We have  discussed adjuvant chemotherapy with FOLFOX based regimen.  We also discussed single agent chemotherapy with Xeloda for a total of 6 months. - We also discussed about the availability of alliance clinical trial with modified FOLFOX 6 with or without Atezolizumab for MSI high patients. - We will obtain a baseline CEA level today.  I did not recommend genetic testing as BRAF mutation was positive. - Patient and her daughter would like to think about further plan and let us know at next visit.  2.  Microcytic anemia: - Hemoglobin on 10/31/2020 was 7.9 with MCV of 71. - We will check her hemoglobin today along with ferritin and iron panel.  We will also check for Y23 and folic acid. - Recommend starting iron tablet daily.  Will consider parenteral iron therapy if she is planning to take adjuvant chemotherapy for quick improvement in her hemoglobin.   All questions were answered. The patient knows to call the clinic with any problems,  questions or concerns.   Derek Jack, MD, 11/22/20 1:57 PM  [facility] [phone]   I, Milinda Antis, am acting as a scribe for Dr. Sanda Linger.  I, Derek Jack MD, have reviewed the above documentation for accuracy and completeness, and I agree with the above.

## 2020-11-23 LAB — CEA: CEA: 7.6 ng/mL — ABNORMAL HIGH (ref 0.0–4.7)

## 2020-11-24 ENCOUNTER — Other Ambulatory Visit (HOSPITAL_COMMUNITY): Payer: Self-pay

## 2020-11-24 MED ORDER — PROCHLORPERAZINE MALEATE 10 MG PO TABS: 10.0000 mg | ORAL_TABLET | Freq: Four times a day (QID) | ORAL | 0 refills | Status: DC | PRN

## 2020-11-25 ENCOUNTER — Encounter: Payer: Self-pay | Admitting: Cardiology

## 2020-11-25 ENCOUNTER — Ambulatory Visit (INDEPENDENT_AMBULATORY_CARE_PROVIDER_SITE_OTHER): Payer: Medicare Other | Admitting: Cardiology

## 2020-11-25 VITALS — BP 134/62 | HR 68 | Ht 61.5 in | Wt 110.0 lb

## 2020-11-25 DIAGNOSIS — I25119 Atherosclerotic heart disease of native coronary artery with unspecified angina pectoris: Secondary | ICD-10-CM | POA: Diagnosis not present

## 2020-11-25 DIAGNOSIS — D500 Iron deficiency anemia secondary to blood loss (chronic): Secondary | ICD-10-CM

## 2020-11-25 NOTE — Patient Instructions (Addendum)
Medication Instructions:  Continue all current medications.   Labwork: none  Testing/Procedures: none  Follow-Up: 6 months   Any Other Special Instructions Will Be Listed Below (If Applicable).   If you need a refill on your cardiac medications before your next appointment, please call your pharmacy.  

## 2020-11-25 NOTE — Progress Notes (Signed)
Cardiology Office Note  Date: 11/25/2020   ID: [name], DOB [birth year], MRN [id number]  PCP:  Glenda Chroman, MD  Cardiologist:  Rozann Lesches, MD Electrophysiologist:  None   Chief Complaint  Patient presents with  . Cardiac follow-up    History of Present Illness: [name] is an 85 y.o. female last seen in November 2021.  She presents for a follow-up visit. She underwent laparoscopic right hemicolectomy in December 2021, no obvious perioperative cardiac events.  I reviewed the hospital records, she has also had follow-up with Dr. Delton Coombes for further discussion regarding potential chemotherapy options.  She has not decided as yet whether she will pursue further treatment.  From a cardiac perspective, she does report intermittent angina symptoms, although relatively stable on current regimen which is outlined below.  We did discuss reducing aspirin to 81 mg daily at this point.  Past Medical History:  Diagnosis Date  . Anemia   . Anxiety   . Arthritis   . CAD (coronary artery disease)    Status post CABG April 2021 - Dr. Servando Snare  . Chronic constipation   . Claudication [facility])    Chronic occlusion of the right limb of aortic stent graft.  . Colon cancer ([place])   . Diverticulosis    Left sided noted on colonoscopy 02/2006  . Ectopic atrial tachycardia ([place])   . Esophageal reflux disease   . Essential hypertension   . Hiatal hernia   . History of TIA (transient ischemic attack)   . Hyperlipidemia   . Lumbar radiculopathy   . Macular degeneration   . Migraines   . Penetrating atherosclerotic ulcer of aorta (H[facility])    Complex ulcerated plaque of the infrarenal abdominal aorta mild renal artery stenosis on the right side, normal left renal artery catheterization 2009, status post aortic stent graft  . Renal artery stenosis ([place])    a. Right renal artery stent April 2013 - Dr. Fletcher Anon;  b. 03/2014 PTA/stenting of RRA 2/2 ISR: 6x18 Herculink stent.  . Trigeminal  neuralgia    Right  . Type 2 diabetes mellitus ([place])     Past Surgical History:  Procedure Laterality Date  . ABDOMINAL AORTAGRAM N/A 02/13/2012   Procedure: ABDOMINAL Maxcine Ham;  Surgeon: Wellington Hampshire, MD;  Location: [place] CATH LAB;  Service: Cardiovascular;  Laterality: N/A;  . ABDOMINAL AORTAGRAM N/A 03/17/2014   Procedure: ABDOMINAL Maxcine Ham;  Surgeon: Wellington Hampshire, MD;  Location: [place] CATH LAB;  Service: Cardiovascular;  Laterality: N/A;  . ABDOMINAL HYSTERECTOMY  1982  . BIOPSY  09/15/2020   Procedure: BIOPSY;  Surgeon: [name], MD;  Location: [facility]OS[facility];  Service: Endoscopy;;  . BREAST BIOPSY Left 1990's  . CAROTID ENDARTERECTOMY Left 1993  . COLONOSCOPY    . COLONOSCOPY WITH PROPOFOL N/A 09/15/2020   Procedure: COLONOSCOPY WITH PROPOFOL;  Surgeon: [name], MD;  Location: [facility] ENDOS[facility];  Service: Endoscopy;  Laterality: N/A;  . CORONARY ARTERY BYPASS GRAFT N/A 03/02/2020   Procedure: CORONARY ARTERY BYPASS GRAFTING (CABG)x 3, LIMA TO LAD, SVG TO DIAGONAL, SVG TO RCA, WITH OPEN HARVESTING OF LEFT LOWER GREATER SAPHENOUS VEIN AND RIGHT AND LEFT GREATER SAPHENOUS VEIN HARVESTED ENDOSCOPICALLY;  Surgeon: Grace Isaac, MD;  Location: [place];  Service: Open Heart Surgery;  Laterality: N/A;  . Endologix powerlink bifurcated     Covered stent graft  . ENDOVEIN HARVEST OF GREATER SAPHENOUS VEIN Bilateral 03/02/2020   Procedure: [place] Of Greater Saphenous Vein;  Surgeon:  Grace Isaac, MD;  Location: [place];  Service: Open Heart Surgery;  Laterality: Bilateral;  . LAPAROSCOPIC RIGHT HEMI COLECTOMY Right 10/28/2020   Procedure: LAPAROSCOPIC RIGHT HEMI COLECTOMY,;  Surgeon: Ileana Roup, MD;  Location: [facility] ORS;  Service: General;  Laterality: Right;  . LEFT HEART CATH AND CORONARY ANGIOGRAPHY N/A 01/28/2020   Procedure: LEFT HEART CATH AND CORONARY ANGIOGRAPHY;  Surgeon: Nelva Bush, MD;  Location: [place] CV LAB;  Service: Cardiovascular;   Laterality: N/A;  . LEFT HEART CATHETERIZATION WITH CORONARY ANGIOGRAM N/A 03/17/2014   Procedure: LEFT HEART CATHETERIZATION WITH CORONARY ANGIOGRAM;  Surgeon: Wellington Hampshire, MD;  Location: [facility] CATH LAB;  Service: Cardiovascular;  Laterality: N/A;  . LUMBAR [place]  . PERCUTANEOUS STENT INTERVENTION Right 03/17/2014   Procedure: PERCUTANEOUS STENT INTERVENTION;  Surgeon: Wellington Hampshire, MD;  Location: [place] CATH LAB;  Service: Cardiovascular;  Laterality: Right;  Renal  . RENAL ANGIOGRAM N/A 02/13/2012   Procedure: RENAL ANGIOGRAM;  Surgeon: Wellington Hampshire, MD;  Location: [facility] [place] CATH LAB;  Service: Cardiovascular;  Laterality: N/A;  . RENAL ANGIOGRAM N/A 03/17/2014   Procedure: RENAL ANGIOGRAM;  Surgeon: Wellington Hampshire, MD;  Location: [place] CATH LAB;  Service: Cardiovascular;  Laterality: N/A;  . SCLEROTHERAPY  09/15/2020   Procedure: SCLEROTHERAPY;  Surgeon: [name], MD;  [facility];  Service: Endoscopy;;  . TEE WITHOUT CARDIOVERSION N/A 03/02/2020   Procedure: TRANSESOPHAGEAL ECHOCARDIOGRAM (TEE);  Surgeon: Grace Isaac, MD;  Location: [place];  Service: Open Heart Surgery;  Laterality: N/A;  . TONSILLECTOMY AND ADENOIDECTOMY  1947    Current Outpatient Medications  Medication Sig Dispense Refill  . acetaminophen (TYLENOL) 500 MG tablet Take 500 mg by mouth every 6 (six) hours as needed for mild pain or moderate pain.     Marland Kitchen amLODipine (NORVASC) 5 MG tablet Take 1 tablet (5 mg total) by mouth at bedtime. 90 tablet 3  . aspirin EC 325 MG EC tablet Take 1 tablet (325 mg total) by mouth daily. 30 tablet 0  . Cholecalciferol (VITAMIN D3) 1000 UNITS CAPS Take 1,000 Units by mouth daily.     . clonazePAM (KLONOPIN) 0.5 MG tablet Take 0.25 mg by mouth at bedtime.    . docusate sodium (COLACE) 100 MG capsule Take 100 mg by mouth at bedtime.     . isosorbide mononitrate (IMDUR) 30 MG 24 hr tablet Take 0.5 tablets (15 mg total) by mouth daily. (Patient taking differently:  Take 15 mg by mouth daily at 8 pm.) 45 tablet 1  . Lactobacillus (PROBIOTIC ACIDOPHILUS PO) Take 1 tablet by mouth daily with lunch.     . metoprolol tartrate (LOPRESSOR) 50 MG tablet Take 0.5 tablets (25 mg total) by mouth in the morning. & 50 mg at lunch and in the evening (Patient taking differently: Take 25-50 mg by mouth See admin instructions. Take 0.5 tablet (25 mg) by mouth in the morning, take 1 tablet (50 mg) by mouth at 1500 & take 1 tablet (50 mg) by mouth at bedtime.) 135 tablet 1  . Multiple Vitamins-Minerals (PRESERVISION AREDS 2 PO) Take 1 tablet by mouth in the morning and at bedtime.    . Na Sulfate-K Sulfate-Mg Sulf (SUPREP BOWEL PREP KIT) 17.5-3.13-1.6 GM/177ML SOLN Take 1 kit by mouth as directed. 324 mL 0  . nitroGLYCERIN (NITROSTAT) 0.4 MG SL tablet Place 1 tablet (0.4 mg total) under the tongue every 5 (five) minutes as needed for chest pain. MORE THAN 2  DOSES GO TO ED (Patient taking differently: Place 0.4 mg under the tongue every 5 (five) minutes x 3 doses as needed for chest pain. MORE THAN 2 DOSES GO TO ED) 25 tablet 3  . pantoprazole (PROTONIX) 40 MG tablet Take 40 mg by mouth daily as needed (indigestion/heartburn.).     Marland Kitchen prochlorperazine (COMPAZINE) 10 MG tablet Take 1 tablet (10 mg total) by mouth every 6 (six) hours as needed for nausea or vomiting. 30 tablet 0  . rosuvastatin (CRESTOR) 20 MG tablet Take 1 tablet (20 mg total) by mouth at bedtime. 90 tablet 3   No current facility-administered medications for this visit.   Allergies:  Cefixime, Conjugated estrogens, Morphine and related, Codeine, Iohexol, and Adenosine   ROS: No syncope.  Physical Exam: VS:  BP 134/62   Pulse 68   Ht 5' 1.5" (1.562 m)   Wt 110 lb (49.9 kg)   SpO2 99%   BMI 20.45 kg/m , BMI Body mass index is 20.45 kg/m.  Wt Readings from Last 3 Encounters:  11/25/20 110 lb (49.9 kg)  11/22/20 110 lb 12.8 oz (50.3 kg)  10/30/20 108 lb 7.5 oz (49.2 kg)    General: Elderly woman, appears  comfortable at rest. HEENT: Conjunctiva and lids normal, wearing a mask. Neck: Supple, no elevated JVP or carotid bruits, no thyromegaly. Lungs: Clear to auscultation, nonlabored breathing at rest. Cardiac: Regular rate and rhythm, no S3 or significant systolic murmur. Extremities: No pitting edema.  ECG:  An ECG dated 10/03/2020 was personally reviewed today and demonstrated:  Sinus rhythm with left atrial enlargement and nonspecific ST-T changes.  Recent Labwork: 11/22/2020: ALT 18; AST 20; BUN 22; Creatinine, Ser 0.87; Hemoglobin 7.8; Magnesium 2.2; Platelets 259; Potassium 3.8; Sodium 136     Component Value Date/Time   CHOL 130 01/29/2020 0202   TRIG 105 01/29/2020 0202   HDL 38 (L) 01/29/2020 0202   CHOLHDL 3.4 01/29/2020 0202   VLDL 21 01/29/2020 0202   [place] 71 01/29/2020 0202    Other Studies Reviewed Today:  Echocardiogram 03/01/2020: 1. Left ventricular ejection fraction, by estimation, is 55 to 60%. The  left ventricle has normal function. The left ventricle has no regional  wall motion abnormalities. There is mild left ventricular hypertrophy of  the basal-septal segment. Left  ventricular diastolic parameters are indeterminate.  2. Right ventricular systolic function is normal. The right ventricular  size is normal. There is normal pulmonary artery systolic pressure.  3. The mitral valve is normal in structure. Mild mitral valve  regurgitation. No evidence of mitral stenosis.  4. The aortic valve is tricuspid. Aortic valve regurgitation is not  visualized. Mild to moderate aortic valve sclerosis/calcification is  present, without any evidence of aortic stenosis.  5. The inferior vena cava is normal in size with greater than 50%  respiratory variability, suggesting right atrial pressure of 3 mmHg.   Lexiscan Myoview 09/09/2020:  There was no ST segment deviation noted during stress.  Findings consistent with prior small inferior myocardial  infarction.  This is a low risk study. There is no current ischemia.  The left ventricular ejection fraction is hyperdynamic (>65%).  Assessment and Plan:  1. Multivessel CAD status post CABG in April 2021.  She reports intermittent angina symptoms, plan is to continue medical therapy.  She is on aspirin, Norvasc, Imdur, Lopressor, and Crestor.  Follow-up Myoview in October 2021 was low risk.  2. Adenocarcinoma of the colon, stage IIIc now status post laparoscopic right hemicolectomy and  following with Dr. Delton Coombes for further discussion regarding potential adjuvant chemotherapy.  3. Iron deficiency anemia, on iron supplements.  Recent hemoglobin 7.8.  Medication Adjustments/Labs and Tests Ordered: Current medicines are reviewed at length with the patient today.  Concerns regarding medicines are outlined above.   Tests Ordered: No orders of the defined types were placed in this encounter.   Medication Changes: No orders of the defined types were placed in this encounter.   Disposition:  Follow up 6 months in the [place] office.  Signed, [name], MD, [facility] 11/25/2020 12:05 PM    [facility] [place], [place], [place] [address] Phone: [phone]; Fax: [phone]

## 2020-11-30 ENCOUNTER — Ambulatory Visit (HOSPITAL_COMMUNITY)
Admission: RE | Admit: 2020-11-30 | Discharge: 2020-11-30 | Disposition: A | Discharge status: Home / Self Care | Payer: Medicare Other | Source: Ambulatory Visit | Attending: Hematology | Admitting: Hematology

## 2020-11-30 ENCOUNTER — Other Ambulatory Visit: Payer: Self-pay

## 2020-11-30 DIAGNOSIS — C189 Malignant neoplasm of colon, unspecified: Secondary | ICD-10-CM | POA: Diagnosis not present

## 2020-11-30 DIAGNOSIS — C182 Malignant neoplasm of ascending colon: Secondary | ICD-10-CM | POA: Diagnosis not present

## 2020-11-30 DIAGNOSIS — N281 Cyst of kidney, acquired: Secondary | ICD-10-CM | POA: Diagnosis not present

## 2020-11-30 MED ORDER — IOHEXOL 300 MG/ML  SOLN
75.0000 mL | Freq: Once | INTRAMUSCULAR | Status: AC | PRN
  Administered 2020-11-30: 75 mL via INTRAVENOUS

## 2020-12-01 ENCOUNTER — Inpatient Hospital Stay (HOSPITAL_BASED_OUTPATIENT_CLINIC_OR_DEPARTMENT_OTHER): Payer: Medicare Other | Admitting: Hematology

## 2020-12-01 ENCOUNTER — Encounter (HOSPITAL_COMMUNITY): Payer: Self-pay | Admitting: Hematology

## 2020-12-01 VITALS — BP 160/66 | HR 74 | Temp 96.8°F | Resp 16 | Wt 110.1 lb

## 2020-12-01 DIAGNOSIS — C182 Malignant neoplasm of ascending colon: Secondary | ICD-10-CM

## 2020-12-01 DIAGNOSIS — H6980 Other specified disorders of Eustachian tube, unspecified ear: Secondary | ICD-10-CM | POA: Insufficient documentation

## 2020-12-01 DIAGNOSIS — D509 Iron deficiency anemia, unspecified: Secondary | ICD-10-CM | POA: Diagnosis not present

## 2020-12-01 DIAGNOSIS — E1165 Type 2 diabetes mellitus with hyperglycemia: Secondary | ICD-10-CM | POA: Insufficient documentation

## 2020-12-01 DIAGNOSIS — Z6822 Body mass index (BMI) 22.0-22.9, adult: Secondary | ICD-10-CM | POA: Insufficient documentation

## 2020-12-01 NOTE — Progress Notes (Signed)
[place], [place] [address]   CLINIC:  Medical Oncology/Hematology  PCP:  Glenda Chroman, MD [address] / [place] [address] [phone]   REASON FOR VISIT:  Follow-up for right colon cancer  PRIOR THERAPY: Right hemicolectomy on 10/28/2020  NGS Results: Loss of MLH1 and PMS2, BRAF V 600 E mutation positive, MSI--high  CURRENT THERAPY: Iron tablet QD  BRIEF ONCOLOGIC HISTORY:  Oncology History   No history exists.    CANCER STAGING: Cancer Staging No matching staging information was found for the patient.  INTERVAL HISTORY:  [name], a 85 y.o. female, returns for routine follow-up of her right colon cancer. [name] was last seen on 11/22/2020.   Today she is accompanied by her daughter and she reports feeling okay. She started taking 1 iron tablet on 1/12 and reports that she developed constipation on 1/19 for which she started taking Colace. She has a chronic history of constipation. She reports that her energy levels and palpitations have improved since she started taking iron. She denies having COPD, but reports having angina occasionally for which she has NTG but has never taken it since the angina resolves after a few minutes. Her angina is staying stable.   REVIEW OF SYSTEMS:  Review of Systems  Constitutional: Positive for fatigue (75%). Negative for appetite change.  Cardiovascular: Positive for chest pain (occasional angina) and palpitations (improving w/ iron).  Gastrointestinal: Positive for constipation (on Colace).  All other systems reviewed and are negative.   PAST MEDICAL/SURGICAL HISTORY:  Past Medical History:  Diagnosis Date  . Anemia   . Anxiety   . Arthritis   . CAD (coronary artery disease)    Status post CABG April 2021 - Dr. Servando Snare  . Chronic constipation   . Claudication [facility])    Chronic occlusion of the right limb of aortic stent graft.  . Colon cancer ([place])   . Diverticulosis     Left sided noted on colonoscopy 02/2006  . Ectopic atrial tachycardia ([place])   . Esophageal reflux disease   . Essential hypertension   . Hiatal hernia   . History of TIA (transient ischemic attack)   . Hyperlipidemia   . Lumbar radiculopathy   . Macular degeneration   . Migraines   . Penetrating atherosclerotic ulcer of aorta (HCC)    Complex ulcerated plaque of the infrarenal abdominal aorta mild renal artery stenosis on the right side, normal left renal artery catheterization 2009, status post aortic stent graft  . Renal artery stenosis ([place])    a. Right renal artery stent April 2013 - Dr. Fletcher Anon;  b. 03/2014 PTA/stenting of RRA 2/2 ISR: 6x18 Herculink stent.  . Trigeminal neuralgia    Right  . Type 2 diabetes mellitus ([place])    Past Surgical History:  Procedure Laterality Date  . ABDOMINAL AORTAGRAM N/A 02/13/2012   Procedure: ABDOMINAL Maxcine Ham;  Surgeon: Wellington Hampshire, MD;  Location: [place] CATH LAB;  Service: Cardiovascular;  Laterality: N/A;  . ABDOMINAL AORTAGRAM N/A 03/17/2014   Procedure: ABDOMINAL Maxcine Ham;  Surgeon: Wellington Hampshire, MD;  Location: [place] CATH LAB;  Service: Cardiovascular;  Laterality: N/A;  . ABDOMINAL HYSTERECTOMY  1982  . BIOPSY  09/15/2020   Procedure: BIOPSY;  Surgeon: [name], MD;  [facility] ENDOSCOPY;  Service: Endoscopy;;  . BREAST BIOPSY Left 1990's  . CAROTID ENDARTERECTOMY Left 1993  . COLONOSCOPY    . COLONOSCOPY WITH PROPOFOL N/A 09/15/2020  Procedure: COLONOSCOPY WITH PROPOFOL;  Surgeon: [name], MD;  [facility] ENDOS[facility];  Service: Endoscopy;  Laterality: N/A;  . CORONARY ARTERY BYPASS GRAFT N/A 03/02/2020   Procedure: CORONARY ARTERY BYPASS GRAFTING (CABG)x 3, LIMA TO LAD, SVG TO DIAGONAL, SVG TO RCA, WITH OPEN HARVESTING OF LEFT LOWER GREATER SAPHENOUS VEIN AND RIGHT AND LEFT GREATER SAPHENOUS VEIN HARVESTED ENDOSCOPICALLY;  Surgeon: Grace Isaac, MD;  Location: [facility];  Service: Open Heart Surgery;  Laterality: N/A;  .  Endologix powerlink bifurcated     Covered stent graft  . ENDOVEIN HARVEST OF GREATER SAPHENOUS VEIN Bilateral 03/02/2020   Procedure: [place] Of Greater Saphenous Vein;  Surgeon: Grace Isaac, MD;  Location: [place];  Service: Open Heart Surgery;  Laterality: Bilateral;  . LAPAROSCOPIC RIGHT HEMI COLECTOMY Right 10/28/2020   Procedure: LAPAROSCOPIC RIGHT HEMI COLECTOMY,;  Surgeon: Ileana Roup, MD;  [facility] ORS;  Service: General;  Laterality: Right;  . LEFT HEART CATH AND CORONARY ANGIOGRAPHY N/A 01/28/2020   Procedure: LEFT HEART CATH AND CORONARY ANGIOGRAPHY;  Surgeon: Nelva Bush, MD;  Location: [facility] CV LAB;  Service: Cardiovascular;  Laterality: N/A;  . LEFT HEART CATHETERIZATION WITH CORONARY ANGIOGRAM N/A 03/17/2014   Procedure: LEFT HEART CATHETERIZATION WITH CORONARY ANGIOGRAM;  Surgeon: Wellington Hampshire, MD;  Location: [facility] CATH LAB;  Service: Cardiovascular;  Laterality: N/A;  . LUMBAR [place]  . PERCUTANEOUS STENT INTERVENTION Right 03/17/2014   Procedure: PERCUTANEOUS STENT INTERVENTION;  Surgeon: Wellington Hampshire, MD;  Location: [place] CATH LAB;  Service: Cardiovascular;  Laterality: Right;  Renal  . RENAL ANGIOGRAM N/A 02/13/2012   Procedure: RENAL ANGIOGRAM;  Surgeon: Wellington Hampshire, MD;  Location: [place] CATH LAB;  Service: Cardiovascular;  Laterality: N/A;  . RENAL ANGIOGRAM N/A 03/17/2014   Procedure: RENAL ANGIOGRAM;  Surgeon: Wellington Hampshire, MD;  Location: [place] CATH LAB;  Service: Cardiovascular;  Laterality: N/A;  . SCLEROTHERAPY  09/15/2020   Procedure: SCLEROTHERAPY;  Surgeon: [name], MD;  [facility];  Service: Endoscopy;;  . TEE WITHOUT CARDIOVERSION N/A 03/02/2020   Procedure: TRANSESOPHAGEAL ECHOCARDIOGRAM (TEE);  Surgeon: Grace Isaac, MD;  Location: [place];  Service: Open Heart Surgery;  Laterality: N/A;  . TONSILLECTOMY AND ADENOIDECTOMY  1947    SOCIAL HISTORY:  Social History   Socioeconomic History   . Marital status: Widowed    Spouse name: Not on file  . Number of children: 1  . Years of education: 46  . Highest education level: Not on file  Occupational History  . Occupation: Retired    Comment: Scientist, clinical (histocompatibility and immunogenetics) at [employer]  . Smoking status: Former Smoker    Packs/day: 0.20    Years: 1.00    Pack years: 0.20    Types: Cigarettes    Quit date: 11/13/1979    Years since quitting: 41.0  . Smokeless tobacco: Never Used  . Tobacco comment: "smoked a few months"  Vaping Use  . Vaping Use: Never used  Substance and Sexual Activity  . Alcohol use: No    Alcohol/week: 0.0 standard drinks  . Drug use: Never  . Sexual activity: Not on file    Comment: Hysterectomy  Other Topics Concern  . Not on file  Social History Narrative   Drinks 1/2 caf coffee 3-4 cups a day    Social Determinants of Health   Financial Resource Strain: Low Risk   . Difficulty of Paying Living Expenses: Not hard at all  Food Insecurity: No Food Insecurity  . Worried About Charity fundraiser in the Last Year: Never true  . Ran Out of Food in the Last Year: Never true  Transportation Needs: No Transportation Needs  . Lack of Transportation (Medical): No  . Lack of Transportation (Non-Medical): No  Physical Activity: Inactive  . Days of Exercise per Week: 0 days  . Minutes of Exercise per Session: 0 min  Stress: Stress Concern Present  . Feeling of Stress : To some extent  Social Connections: Moderately Isolated  . Frequency of Communication with Friends and Family: Three times a week  . Frequency of Social Gatherings with Friends and Family: Three times a week  . Attends Religious Services: More than 4 times per year  . Active Member of Clubs or Organizations: No  . Attends Archivist Meetings: Never  . Marital Status: Widowed  Intimate Partner Violence: Not At Risk  . Fear of Current or Ex-Partner: No  . Emotionally Abused: No  . Physically Abused: No  . Sexually Abused: No     FAMILY HISTORY:  Family History  Problem Relation Age of Onset  . Heart failure Mother   . Coronary artery disease Mother   . Heart disease Mother   . Hypertension Mother   . Stroke Mother   . Heart attack Father   . Heart disease Father   . Hypertension Father   . Heart disease Brother   . Hypertension Sister   . Parkinson's disease Sister   . Kidney disease Sister   . Stroke Sister   . Hypertension Brother   . Stroke Sister   . Hypertension Sister   . Breast cancer Neg Hx     CURRENT MEDICATIONS:  Current Outpatient Medications  Medication Sig Dispense Refill  . acetaminophen (TYLENOL) 500 MG tablet Take 500 mg by mouth every 6 (six) hours as needed for mild pain or moderate pain.     Marland Kitchen amLODipine (NORVASC) 5 MG tablet Take 1 tablet (5 mg total) by mouth at bedtime. 90 tablet 3  . aspirin EC 325 MG EC tablet Take 1 tablet (325 mg total) by mouth daily. 30 tablet 0  . Cholecalciferol (VITAMIN D3) 1000 UNITS CAPS Take 1,000 Units by mouth daily.     . clonazePAM (KLONOPIN) 0.5 MG tablet Take 0.25 mg by mouth at bedtime.    . docusate sodium (COLACE) 100 MG capsule Take 100 mg by mouth at bedtime.     . isosorbide mononitrate (IMDUR) 30 MG 24 hr tablet Take 0.5 tablets (15 mg total) by mouth daily. (Patient taking differently: Take 15 mg by mouth daily at 8 pm.) 45 tablet 1  . Lactobacillus (PROBIOTIC ACIDOPHILUS PO) Take 1 tablet by mouth daily with lunch.     . metoprolol tartrate (LOPRESSOR) 50 MG tablet Take 0.5 tablets (25 mg total) by mouth in the morning. & 50 mg at lunch and in the evening (Patient taking differently: Take 25-50 mg by mouth See admin instructions. Take 0.5 tablet (25 mg) by mouth in the morning, take 1 tablet (50 mg) by mouth at 1500 & take 1 tablet (50 mg) by mouth at bedtime.) 135 tablet 1  . Multiple Vitamins-Minerals (PRESERVISION AREDS 2 PO) Take 1 tablet by mouth in the morning and at bedtime.    . Na Sulfate-K Sulfate-Mg Sulf (SUPREP BOWEL PREP  KIT) 17.5-3.13-1.6 GM/177ML SOLN Take 1 kit by mouth as directed. 324 mL 0  . nitroGLYCERIN (NITROSTAT) 0.4 MG SL tablet  Place 1 tablet (0.4 mg total) under the tongue every 5 (five) minutes as needed for chest pain. MORE THAN 2 DOSES GO TO ED (Patient taking differently: Place 0.4 mg under the tongue every 5 (five) minutes x 3 doses as needed for chest pain. MORE THAN 2 DOSES GO TO ED) 25 tablet 3  . pantoprazole (PROTONIX) 40 MG tablet Take 40 mg by mouth daily as needed (indigestion/heartburn.).     Marland Kitchen prochlorperazine (COMPAZINE) 10 MG tablet Take 1 tablet (10 mg total) by mouth every 6 (six) hours as needed for nausea or vomiting. 30 tablet 0  . rosuvastatin (CRESTOR) 20 MG tablet Take 1 tablet (20 mg total) by mouth at bedtime. 90 tablet 3   No current facility-administered medications for this visit.    ALLERGIES:  Allergies  Allergen Reactions  . Cefixime Shortness Of Breath    Patient is not familiar with this listed allergy  . Conjugated Estrogens Other (See Comments)    REACTION: flushing  . Morphine And Related Other (See Comments)    Morphine injection Neck pain  . Codeine Other (See Comments)    REACTION: dizziness  . Iohexol      Desc: pt has anxiety and an irregular heartbeat. contrast exacerbates this. pt was very uncomfortable after a 20cc bolus for an miroi. we did w/o iv 11/09 per dr. Kris Hartmann. pt will talk w/ md about this and will probably not get iv contrast in the future at her re, Onset Date: [id number] Multi CT's with contrast done at [facility] without pre meds, Heart Cath without pre med documented 2021. CT A/P with contrast without premeds 11/2020 without issues    . Adenosine Other (See Comments)    Elevated heart rate when doing stress test    PHYSICAL EXAM:  Performance status (ECOG): 1 - Symptomatic but completely ambulatory  Vitals:   12/01/20 1122  BP: (!) 160/66  Pulse: 74  Resp: 16  Temp: (!) 96.8 F (36 C)  SpO2: 100%   Wt Readings from Last 3  Encounters:  12/01/20 110 lb 1.6 oz (49.9 kg)  11/25/20 110 lb (49.9 kg)  11/22/20 110 lb 12.8 oz (50.3 kg)   Physical Exam Vitals reviewed.  Constitutional:      Appearance: Normal appearance.  Neurological:     General: No focal deficit present.     Mental Status: She is alert and oriented to person, place, and time.  Psychiatric:        Mood and Affect: Mood normal.        Behavior: Behavior normal.      LABORATORY DATA:  I have reviewed the labs as listed.  CBC Latest Ref Rng & Units 11/22/2020 10/31/2020 10/30/2020  WBC 4.0 - 10.5 K/uL 4.7 5.8 6.2  Hemoglobin 12.0 - 15.0 g/dL 7.8(L) 7.9(L) 7.8(L)  Hematocrit 36.0 - 46.0 % 27.5(L) 26.3(L) 25.6(L)  Platelets 150 - 400 K/uL 259 331 332   CMP Latest Ref Rng & Units 11/22/2020 10/31/2020 10/30/2020  Glucose 70 - 99 mg/dL 125(H) 94 112(H)  BUN 8 - 23 mg/dL 22 15 11   Creatinine 0.44 - 1.00 mg/dL 0.87 0.96 0.84  Sodium 135 - 145 mmol/L 136 128(L) 126(L)  Potassium 3.5 - 5.1 mmol/L 3.8 3.2(L) 3.2(L)  Chloride 98 - 111 mmol/L 104 92(L) 92(L)  CO2 22 - 32 mmol/L 27 28 23   Calcium 8.9 - 10.3 mg/dL 9.6 9.3 9.4  Total Protein 6.5 - 8.1 g/dL 6.7 - -  Total Bilirubin 0.3 - 1.2  mg/dL 0.4 - -  Alkaline Phos 38 - 126 U/L 94 - -  AST 15 - 41 U/L 20 - -  ALT 0 - 44 U/L 18 - -   Lab Results  Component Value Date   CEA1 7.6 (H) 11/22/2020   Lab Results  Component Value Date   TIBC 558 (H) 11/22/2020   FERRITIN 11 11/22/2020   IRONPCTSAT 3 (L) 11/22/2020    DIAGNOSTIC IMAGING:  I have independently reviewed the scans and discussed with the patient. CT Abdomen Pelvis W Contrast  Result Date: 11/30/2020 CLINICAL DATA:  History of stage III adenocarcinoma of the ascending colon status post right hemicolectomy on 10/28/2020, chemo/immunotherapy under workup. EXAM: CT ABDOMEN AND PELVIS WITH CONTRAST TECHNIQUE: Multidetector CT imaging of the abdomen and pelvis was performed using the standard protocol following bolus administration of  intravenous contrast. CONTRAST:  90m OMNIPAQUE IOHEXOL 300 MG/ML  SOLN COMPARISON:  CT abdomen pelvis August 09, 2020 and chest CT September 07, 2020, CT abdomen and pelvis June 02, 2012 and September 30, 2008 FINDINGS: Lower chest: Prior median sternotomy and CABG. No acute abnormality. Hepatobiliary: No focal liver abnormality is seen. No gallstones, gallbladder wall thickening, or biliary dilatation. Pancreas: Unremarkable. No pancreatic ductal dilatation or surrounding inflammatory changes. Spleen: Normal in size without focal abnormality. Adrenals/Urinary Tract: Bilateral adrenal glands are unremarkable. Cortical 2 cm right renal cyst. Additionally there are bilateral small scattered hypodensities in the kidneys which are too small to characterize but most consistent with cysts. Small focus of calcification along the left interpolar cortical rim (series 2, image 24), likely sequela of prior insult. No hydronephrosis. No nephrolithiasis. Urinary bladder is distended without evidence of wall thickening. Stomach/Bowel: Postsurgical changes of right hemicolectomy with anastomosis for ascending colonic mass resection. Stomach is predominantly decompressed. No evidence of small-bowel obstruction, wall thickening or acute inflammation. Moderate volume of formed stool throughout the colon. No suspicious colonic wall thickening or inflammation. Vascular/Lymphatic: Extensive aortic and branch vessel atherosclerosis. Stent graft in the distal aorta extending into the bilateral common iliacs, with similar apparent occlusion of the right common iliac limb. No pathologically enlarged abdominopelvic lymph nodes. Reproductive: Status post hysterectomy. No adnexal masses. Other: No ascites, peritoneal nodularity or pneumoperitoneum. Musculoskeletal: Multilevel degenerative changes spine. No suspicious lytic or blastic lesion of bone. IMPRESSION: 1. Postsurgical changes of right hemicolectomy with anastomosis for ascending  colonic mass resection. No evidence of recurrent or metastatic disease within the abdomen or pelvis. 2. Extensive aortic and branch vessel atherosclerosis with stent graft in the distal aorta extending into the bilateral common iliacs, with similar apparent occlusion of the right common iliac limb. 3. Moderate volume of formed stool throughout the colon. 4. Aortic atherosclerosis. Aortic Atherosclerosis (ICD10-I70.0). Electronically Signed   By: JDahlia BailiffMD   On: 11/30/2020 16:14     ASSESSMENT:  1.  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 - CT AP with contrast on 08/10/2020 with abnormal appearance of right colon with colocolonic intussusception with wall thickening and abnormally enlarged adjacent right mesenteric lymph nodes. - CT chest with contrast on 09/07/2020 with no evidence of metastatic disease in the chest. -Colonoscopy on 09/15/2020-large spherical, ulcerated, partially necrotic tumor in the transverse/right colon.  Scope could not be maneuvered more proximally to the tumor. - Right hemicolectomy on 10/28/2020 by Dr. WDema Severin - Pathology shows invasive moderately to poorly differentiated carcinoma involving the mid ascending colon, spanning 10 cm, tumor invades through muscularis  propria into pericolorectal tissue, margins negative.  9/39 lymph nodes involved.  Lymphovascular invasion present.  PT3PN2B. - Loss of MLH1 and PMS2. - BRAF V600 E mutation positive.  MSI-high.  2.  Social/family history: - She is accompanied by her daughter today.  Lives by herself and is independent of ADLs and IADLs.  Worked as a Catering manager. - Maternal aunt had ovarian cancer.  No other malignancies.   PLAN:  1.  Stage IIIc (PT3PN2B) adenocarcinoma of ascending colon: - Her CEA is mildly elevated at 7.6. - We reviewed results of CTAP from 11/30/2020 which showed postsurgical changes of right hemicolectomy with  anastomosis.  No recurrent or metastatic disease was seen. - She has high risk stage III disease with involvement of 9 lymph nodes. - I have recommended adjuvant chemotherapy with single agent capecitabine versus FOLFOX. - She was concerned about side effects and chose for close surveillance. - As her CEA was mildly elevated, we have also discussed option of checking PET scan at this time versus repeating CEA in  2 months.  If the CEA at that time does not improve, will consider PET CT scan.  She is agreeable to the plan.  2.  Microcytic anemia: - Her iron panel showed severe low ferritin. - She started taking iron tablet twice daily on 11/24/2019.  She reported constipation yesterday. - She was told to take stool softener.  She was told to cut back iron to once daily. - I plan to repeat her labs in 4 weeks.  If there is no improvement in her iron and CBC, will consider parenteral iron therapy.   Orders placed this encounter:  No orders of the defined types were placed in this encounter.    Derek Jack, MD [place] [phone]   I, Milinda Antis, am acting as a scribe for Dr. Sanda Linger.  I, Derek Jack MD, have reviewed the above documentation for accuracy and completeness, and I agree with the above.

## 2020-12-01 NOTE — Patient Instructions (Signed)
[facility] [place] at [facility] Discharge Instructions  You were seen today by Dr. Delton Coombes. He went over your recent results and scans. Start taking the iron tablet every other day. Dr. Delton Coombes will see you back in 1 month for labs and follow up.   Thank you for choosing [place] at [facility] to provide your oncology and hematology care.  To afford each patient quality time with our provider, please arrive at least 15 minutes before your scheduled appointment time.   If you have a lab appointment with the [facility] please come in thru the Main Entrance and check in at the main information desk  You need to re-schedule your appointment should you arrive 10 or more minutes late.  We strive to give you quality time with our providers, and arriving late affects you and other patients whose appointments are after yours.  Also, if you no show three or more times for appointments you may be dismissed from the clinic at the providers discretion.     Again, thank you for choosing [facility].  Our hope is that these requests will decrease the amount of time that you wait before being seen by our physicians.       _____________________________________________________________  Should you have questions after your visit to [facility], please contact our office at [phone] between the hours of 8:00 a.m. and 4:30 p.m.  Voicemails left after 4:00 p.m. will not be returned until the following business day.  For prescription refill requests, have your pharmacy contact our office and allow 72 hours.    Cancer Center Support Programs:   > Cancer Support Group  2nd Tuesday of the month 1pm-2pm, Journey Room

## 2020-12-12 DIAGNOSIS — I10 Essential (primary) hypertension: Secondary | ICD-10-CM | POA: Diagnosis not present

## 2020-12-29 ENCOUNTER — Inpatient Hospital Stay (HOSPITAL_COMMUNITY): Payer: Medicare Other | Attending: Hematology

## 2020-12-29 ENCOUNTER — Other Ambulatory Visit: Payer: Self-pay

## 2020-12-29 DIAGNOSIS — C182 Malignant neoplasm of ascending colon: Secondary | ICD-10-CM

## 2020-12-29 DIAGNOSIS — D509 Iron deficiency anemia, unspecified: Secondary | ICD-10-CM | POA: Insufficient documentation

## 2020-12-29 LAB — CBC WITH DIFFERENTIAL/PLATELET
Abs Immature Granulocytes: 0.01 10*3/uL (ref 0.00–0.07)
Basophils Absolute: 0 10*3/uL (ref 0.0–0.1)
Basophils Relative: 1 %
Eosinophils Absolute: 0.2 10*3/uL (ref 0.0–0.5)
Eosinophils Relative: 4 %
HCT: 45.7 % (ref 36.0–46.0)
Hemoglobin: 13.4 g/dL (ref 12.0–15.0)
Immature Granulocytes: 0 %
Lymphocytes Relative: 30 %
Lymphs Abs: 1.3 10*3/uL (ref 0.7–4.0)
MCH: 24.5 pg — ABNORMAL LOW (ref 26.0–34.0)
MCHC: 29.3 g/dL — ABNORMAL LOW (ref 30.0–36.0)
MCV: 83.7 fL (ref 80.0–100.0)
Monocytes Absolute: 0.4 10*3/uL (ref 0.1–1.0)
Monocytes Relative: 10 %
Neutro Abs: 2.4 10*3/uL (ref 1.7–7.7)
Neutrophils Relative %: 55 %
Platelets: 217 10*3/uL (ref 150–400)
RBC: 5.46 MIL/uL — ABNORMAL HIGH (ref 3.87–5.11)
RDW: 24.1 % — ABNORMAL HIGH (ref 11.5–15.5)
WBC: 4.2 10*3/uL (ref 4.0–10.5)
nRBC: 0 % (ref 0.0–0.2)

## 2020-12-29 LAB — IRON AND TIBC
Iron: 101 ug/dL (ref 28–170)
Saturation Ratios: 20 % (ref 10.4–31.8)
TIBC: 512 ug/dL — ABNORMAL HIGH (ref 250–450)
UIBC: 411 ug/dL

## 2020-12-29 LAB — RETICULOCYTES
Immature Retic Fract: 12.1 % (ref 2.3–15.9)
RBC.: 5.38 MIL/uL — ABNORMAL HIGH (ref 3.87–5.11)
Retic Count, Absolute: 42.5 10*3/uL (ref 19.0–186.0)
Retic Ct Pct: 0.8 % (ref 0.4–3.1)

## 2020-12-29 LAB — FERRITIN: Ferritin: 45 ng/mL (ref 11–307)

## 2020-12-30 LAB — CEA: CEA: 2.5 ng/mL (ref 0.0–4.7)

## 2021-01-03 ENCOUNTER — Other Ambulatory Visit: Payer: Self-pay

## 2021-01-03 ENCOUNTER — Inpatient Hospital Stay (HOSPITAL_BASED_OUTPATIENT_CLINIC_OR_DEPARTMENT_OTHER): Payer: Medicare Other | Admitting: Hematology

## 2021-01-03 VITALS — BP 194/74 | HR 81 | Temp 96.9°F | Resp 16 | Wt 110.2 lb

## 2021-01-03 DIAGNOSIS — D509 Iron deficiency anemia, unspecified: Secondary | ICD-10-CM | POA: Diagnosis not present

## 2021-01-03 DIAGNOSIS — C182 Malignant neoplasm of ascending colon: Secondary | ICD-10-CM | POA: Diagnosis not present

## 2021-01-03 NOTE — Progress Notes (Signed)
[facility], [place] [address]   CLINIC:  Medical Oncology/Hematology  PCP:  Glenda Chroman, MD [address] / [place] [address] [phone]   REASON FOR VISIT:  Follow-up for right colon cancer  PRIOR THERAPY: Right hemicolectomy on 10/28/2020  NGS Results: Loss of MLH1 and PMS2, BRAF V 600 E mutation positive, MSI--high  CURRENT THERAPY: Iron tablet BID  BRIEF ONCOLOGIC HISTORY:  Oncology History   No history exists.    CANCER STAGING: Cancer Staging No matching staging information was found for the patient.  INTERVAL HISTORY:  Ms. MELVA FAUX, a 85 y.o. female, returns for routine follow-up of her right colon cancer. Trinnity was last seen on 12/01/2020.   Today she is accompanied by her daughter and she reports feeling well. She is taking 1 iron tablet BID and tolerating it well; she is also taking stool softener for constipation control and denies having N/V/D. She complains of having LLQ occasionally for which she takes Tylenol; she notes having 1 episode which was so severe it made her nauseous. Her appetite is excellent. She checks her BP at home which is usually 150's/60-70's.   REVIEW OF SYSTEMS:  Review of Systems  Constitutional: Positive for fatigue (90%). Negative for appetite change.  Gastrointestinal: Positive for abdominal pain (6/10 LLQ pain occasionally). Negative for constipation, diarrhea, nausea and vomiting.  Musculoskeletal: Positive for back pain (6/10 back pain).  Neurological: Positive for headaches and numbness (fingertips).  All other systems reviewed and are negative.   PAST MEDICAL/SURGICAL HISTORY:  Past Medical History:  Diagnosis Date  . Anemia   . Anxiety   . Arthritis   . CAD (coronary artery disease)    Status post CABG April 2021 - Dr. Servando Snare  . Chronic constipation   . Claudication [facility])    Chronic occlusion of the right limb of aortic stent graft.  . Colon cancer ([place])   .  Diverticulosis    Left sided noted on colonoscopy 02/2006  . Ectopic atrial tachycardia ([place])   . Esophageal reflux disease   . Essential hypertension   . Hiatal hernia   . History of TIA (transient ischemic attack)   . Hyperlipidemia   . Lumbar radiculopathy   . Macular degeneration   . Migraines   . Penetrating atherosclerotic ulcer of aorta (H[facility])    Complex ulcerated plaque of the infrarenal abdominal aorta mild renal artery stenosis on the right side, normal left renal artery catheterization 2009, status post aortic stent graft  . Renal artery stenosis ([place])    a. Right renal artery stent April 2013 - Dr. Fletcher Anon;  b. 03/2014 PTA/stenting of RRA 2/2 ISR: 6x18 Herculink stent.  . Trigeminal neuralgia    Right  . Type 2 diabetes mellitus ([place])    Past Surgical History:  Procedure Laterality Date  . ABDOMINAL AORTAGRAM N/A 02/13/2012   Procedure: ABDOMINAL Maxcine Ham;  Surgeon: Wellington Hampshire, MD;  Location: [facility] CATH LAB;  Service: Cardiovascular;  Laterality: N/A;  . ABDOMINAL AORTAGRAM N/A 03/17/2014   Procedure: ABDOMINAL Maxcine Ham;  Surgeon: Wellington Hampshire, MD;  Location: [facility] CATH LAB;  Service: Cardiovascular;  Laterality: N/A;  . ABDOMINAL HYSTERECTOMY  1982  . BIOPSY  09/15/2020   Procedure: BIOPSY;  Surgeon: [name], MD;  [facility] ENDOS[facility];  Service: Endoscopy;;  . BREAST BIOPSY Left 1990's  . CAROTID ENDARTERECTOMY Left 1993  . COLONOSCOPY    . COLONOSCOPY WITH PROPOFOL N/A 09/15/2020  Procedure: COLONOSCOPY WITH PROPOFOL;  Surgeon: [name], MD;  [facility];  Service: Endoscopy;  Laterality: N/A;  . CORONARY ARTERY BYPASS GRAFT N/A 03/02/2020   Procedure: CORONARY ARTERY BYPASS GRAFTING (CABG)x 3, LIMA TO LAD, SVG TO DIAGONAL, SVG TO RCA, WITH OPEN HARVESTING OF LEFT LOWER GREATER SAPHENOUS VEIN AND RIGHT AND LEFT GREATER SAPHENOUS VEIN HARVESTED ENDOSCOPICALLY;  Surgeon: Grace Isaac, MD;  Location: [facility];  Service: Open Heart Surgery;   Laterality: N/A;  . Endologix powerlink bifurcated     Covered stent graft  . ENDOVEIN HARVEST OF GREATER SAPHENOUS VEIN Bilateral 03/02/2020   Procedure: [place] Of Greater Saphenous Vein;  Surgeon: Grace Isaac, MD;  Location: [facility];  Service: Open Heart Surgery;  Laterality: Bilateral;  . LAPAROSCOPIC RIGHT HEMI COLECTOMY Right 10/28/2020   Procedure: LAPAROSCOPIC RIGHT HEMI COLECTOMY,;  Surgeon: Ileana Roup, MD;  [facility] OR[facility];  Service: General;  Laterality: Right;  . LEFT HEART CATH AND CORONARY ANGIOGRAPHY N/A 01/28/2020   Procedure: LEFT HEART CATH AND CORONARY ANGIOGRAPHY;  Surgeon: Nelva Bush, MD;  Location: [facility] CV LAB;  Service: Cardiovascular;  Laterality: N/A;  . LEFT HEART CATHETERIZATION WITH CORONARY ANGIOGRAM N/A 03/17/2014   Procedure: LEFT HEART CATHETERIZATION WITH CORONARY ANGIOGRAM;  Surgeon: Wellington Hampshire, MD;  Location: [facility] CATH LAB;  Service: Cardiovascular;  Laterality: N/A;  . LUMBAR [place]  . PERCUTANEOUS STENT INTERVENTION Right 03/17/2014   Procedure: PERCUTANEOUS STENT INTERVENTION;  Surgeon: Wellington Hampshire, MD;  Location: [facility] CATH LAB;  Service: Cardiovascular;  Laterality: Right;  Renal  . RENAL ANGIOGRAM N/A 02/13/2012   Procedure: RENAL ANGIOGRAM;  Surgeon: Wellington Hampshire, MD;  Location: [facility] CATH LAB;  Service: Cardiovascular;  Laterality: N/A;  . RENAL ANGIOGRAM N/A 03/17/2014   Procedure: RENAL ANGIOGRAM;  Surgeon: Wellington Hampshire, MD;  Location: [facility] CATH LAB;  Service: Cardiovascular;  Laterality: N/A;  . SCLEROTHERAPY  09/15/2020   Procedure: SCLEROTHERAPY;  Surgeon: [name], MD;  [facility];  Service: Endoscopy;;  . TEE WITHOUT CARDIOVERSION N/A 03/02/2020   Procedure: TRANSESOPHAGEAL ECHOCARDIOGRAM (TEE);  Surgeon: Grace Isaac, MD;  Location: [facility];  Service: Open Heart Surgery;  Laterality: N/A;  . TONSILLECTOMY AND ADENOIDECTOMY  1947    SOCIAL HISTORY:  Social History    Socioeconomic History  . Marital status: Widowed    Spouse name: Not on file  . Number of children: 1  . Years of education: 17  . Highest education level: Not on file  Occupational History  . Occupation: Retired    Comment: Scientist, clinical (histocompatibility and immunogenetics) at [employer]  . Smoking status: Former Smoker    Packs/day: 0.20    Years: 1.00    Pack years: 0.20    Types: Cigarettes    Quit date: 11/13/1979    Years since quitting: 41.1  . Smokeless tobacco: Never Used  . Tobacco comment: "smoked a few months"  Vaping Use  . Vaping Use: Never used  Substance and Sexual Activity  . Alcohol use: No    Alcohol/week: 0.0 standard drinks  . Drug use: Never  . Sexual activity: Not on file    Comment: Hysterectomy  Other Topics Concern  . Not on file  Social History Narrative   Drinks 1/2 caf coffee 3-4 cups a day    Social Determinants of Health   Financial Resource Strain: Low Risk   . Difficulty of Paying Living Expenses: Not hard at all  Food Insecurity: No Food Insecurity  . Worried About Charity fundraiser in the Last Year: Never true  . Ran Out of Food in the Last Year: Never true  Transportation Needs: No Transportation Needs  . Lack of Transportation (Medical): No  . Lack of Transportation (Non-Medical): No  Physical Activity: Inactive  . Days of Exercise per Week: 0 days  . Minutes of Exercise per Session: 0 min  Stress: Stress Concern Present  . Feeling of Stress : To some extent  Social Connections: Moderately Isolated  . Frequency of Communication with Friends and Family: Three times a week  . Frequency of Social Gatherings with Friends and Family: Three times a week  . Attends Religious Services: More than 4 times per year  . Active Member of Clubs or Organizations: No  . Attends Archivist Meetings: Never  . Marital Status: Widowed  Intimate Partner Violence: Not At Risk  . Fear of Current or Ex-Partner: No  . Emotionally Abused: No  . Physically Abused: No   . Sexually Abused: No    FAMILY HISTORY:  Family History  Problem Relation Age of Onset  . Heart failure Mother   . Coronary artery disease Mother   . Heart disease Mother   . Hypertension Mother   . Stroke Mother   . Heart attack Father   . Heart disease Father   . Hypertension Father   . Heart disease Brother   . Hypertension Sister   . Parkinson's disease Sister   . Kidney disease Sister   . Stroke Sister   . Hypertension Brother   . Stroke Sister   . Hypertension Sister   . Breast cancer Neg Hx     CURRENT MEDICATIONS:  Current Outpatient Medications  Medication Sig Dispense Refill  . acetaminophen (TYLENOL) 500 MG tablet Take 500 mg by mouth every 6 (six) hours as needed for mild pain or moderate pain.     Marland Kitchen amLODipine (NORVASC) 5 MG tablet Take 1 tablet (5 mg total) by mouth at bedtime. 90 tablet 3  . aspirin EC 325 MG EC tablet Take 1 tablet (325 mg total) by mouth daily. 30 tablet 0  . Cholecalciferol (VITAMIN D3) 1000 UNITS CAPS Take 1,000 Units by mouth daily.     . clonazePAM (KLONOPIN) 0.5 MG tablet Take 0.25 mg by mouth at bedtime.    . docusate sodium (COLACE) 100 MG capsule Take 100 mg by mouth at bedtime.     . Lactobacillus (PROBIOTIC ACIDOPHILUS PO) Take 1 tablet by mouth daily with lunch.     . metoprolol tartrate (LOPRESSOR) 50 MG tablet Take 0.5 tablets (25 mg total) by mouth in the morning. & 50 mg at lunch and in the evening (Patient taking differently: Take 25-50 mg by mouth See admin instructions. Take 0.5 tablet (25 mg) by mouth in the morning, take 1 tablet (50 mg) by mouth at 1500 & take 1 tablet (50 mg) by mouth at bedtime.) 135 tablet 1  . Multiple Vitamins-Minerals (PRESERVISION AREDS 2 PO) Take 1 tablet by mouth in the morning and at bedtime.    . Na Sulfate-K Sulfate-Mg Sulf (SUPREP BOWEL PREP KIT) 17.5-3.13-1.6 GM/177ML SOLN Take 1 kit by mouth as directed. 324 mL 0  . pantoprazole (PROTONIX) 40 MG tablet Take 40 mg by mouth daily as needed  (indigestion/heartburn.).     Marland Kitchen prochlorperazine (COMPAZINE) 10 MG tablet Take 1 tablet (10 mg total) by mouth every 6 (six) hours as needed for nausea  or vomiting. 30 tablet 0  . rosuvastatin (CRESTOR) 20 MG tablet Take 1 tablet (20 mg total) by mouth at bedtime. 90 tablet 3  . isosorbide mononitrate (IMDUR) 30 MG 24 hr tablet Take 0.5 tablets (15 mg total) by mouth daily. (Patient taking differently: Take 15 mg by mouth daily at 8 pm.) 45 tablet 1  . nitroGLYCERIN (NITROSTAT) 0.4 MG SL tablet Place 1 tablet (0.4 mg total) under the tongue every 5 (five) minutes as needed for chest pain. MORE THAN 2 DOSES GO TO ED (Patient taking differently: Place 0.4 mg under the tongue every 5 (five) minutes x 3 doses as needed for chest pain. MORE THAN 2 DOSES GO TO ED) 25 tablet 3   No current facility-administered medications for this visit.    ALLERGIES:  Allergies  Allergen Reactions  . Cefixime Shortness Of Breath    Patient is not familiar with this listed allergy  . Conjugated Estrogens Other (See Comments)    REACTION: flushing  . Morphine And Related Other (See Comments)    Morphine injection Neck pain  . Codeine Other (See Comments)    REACTION: dizziness  . Iohexol      Desc: pt has anxiety and an irregular heartbeat. contrast exacerbates this. pt was very uncomfortable after a 20cc bolus for an miroi. we did w/o iv 11/09 per dr. Kris Hartmann. pt will talk w/ md about this and will probably not get iv contrast in the future at her re, Onset Date: [id number] Multi CT's with contrast done at [facility] without pre meds, Heart Cath without pre med documented 2021. CT A/P with contrast without premeds 11/2020 without issues    . Adenosine Other (See Comments)    Elevated heart rate when doing stress test    PHYSICAL EXAM:  Performance status (ECOG): 1 - Symptomatic but completely ambulatory  Vitals:   01/03/21 1039  BP: (!) 194/74  Pulse: 81  Resp: 16  Temp: (!) 96.9 F (36.1 C)  SpO2: 100%   Wt  Readings from Last 3 Encounters:  01/03/21 110 lb 3.2 oz (50 kg)  12/01/20 110 lb 1.6 oz (49.9 kg)  11/25/20 110 lb (49.9 kg)   Physical Exam Vitals reviewed.  Constitutional:      Appearance: Normal appearance.  Cardiovascular:     Rate and Rhythm: Normal rate and regular rhythm.     Pulses: Normal pulses.     Heart sounds: Normal heart sounds.  Pulmonary:     Effort: Pulmonary effort is normal.     Breath sounds: Normal breath sounds.  Abdominal:     Palpations: Abdomen is soft. There is no hepatomegaly, splenomegaly or mass.     Tenderness: There is no abdominal tenderness.     Hernia: No hernia is present.  Musculoskeletal:     Right lower leg: No edema.     Left lower leg: No edema.  Neurological:     General: No focal deficit present.     Mental Status: She is alert and oriented to person, place, and time.  Psychiatric:        Mood and Affect: Mood normal.        Behavior: Behavior normal.      LABORATORY DATA:  I have reviewed the labs as listed.  CBC Latest Ref Rng & Units 12/29/2020 11/22/2020 10/31/2020  WBC 4.0 - 10.5 K/uL 4.2 4.7 5.8  Hemoglobin 12.0 - 15.0 g/dL 13.4 7.8(L) 7.9(L)  Hematocrit 36.0 - 46.0 % 45.7 27.5(L) 26.3(L)  Platelets  150 - 400 K/uL 217 259 331   CMP Latest Ref Rng & Units 11/22/2020 10/31/2020 10/30/2020  Glucose 70 - 99 mg/dL 125(H) 94 112(H)  BUN 8 - 23 mg/dL 22 15 11   Creatinine 0.44 - 1.00 mg/dL 0.87 0.96 0.84  Sodium 135 - 145 mmol/L 136 128(L) 126(L)  Potassium 3.5 - 5.1 mmol/L 3.8 3.2(L) 3.2(L)  Chloride 98 - 111 mmol/L 104 92(L) 92(L)  CO2 22 - 32 mmol/L 27 28 23   Calcium 8.9 - 10.3 mg/dL 9.6 9.3 9.4  Total Protein 6.5 - 8.1 g/dL 6.7 - -  Total Bilirubin 0.3 - 1.2 mg/dL 0.4 - -  Alkaline Phos 38 - 126 U/L 94 - -  AST 15 - 41 U/L 20 - -  ALT 0 - 44 U/L 18 - -   Lab Results  Component Value Date   CEA1 2.5 12/29/2020   CEA1 7.6 (H) 11/22/2020   Lab Results  Component Value Date   TIBC 512 (H) 12/29/2020   TIBC 558 (H)  11/22/2020   FERRITIN 45 12/29/2020   FERRITIN 11 11/22/2020   IRONPCTSAT 20 12/29/2020   IRONPCTSAT 3 (L) 11/22/2020    DIAGNOSTIC IMAGING:  I have independently reviewed the scans and discussed with the patient. No results found.   ASSESSMENT:  1.Stage IIIc (PT3PN2B) adenocarcinoma of ascending colon: -Presentation with abdominal pain for the past few months. -CT AP with contrast on 08/10/2020 with abnormal appearance of right colon with colocolonic intussusception with wall thickening and abnormally enlarged adjacent right mesenteric lymph nodes. -CT chest with contrast on 09/07/2020 with no evidence of metastatic disease in the chest. -Colonoscopy on 09/15/2020-large spherical, ulcerated, partially necrotic tumor in the transverse/right colon. Scope could not be maneuvered more proximally to the tumor. -Right hemicolectomy on 10/28/2020 by Dr. Dema Severin. -Pathology shows invasive moderately to poorly differentiated carcinoma involving the mid ascending colon, spanning 10 cm, tumor invades through muscularis propria into pericolorectal tissue, margins negative. 9/39 lymph nodes involved. Lymphovascular invasion present. PT3PN2B. -Loss of MLH1 and PMS2. -BRAF V600 E mutation positive. MSI-high. -She has refused adjuvant chemotherapy.  2. Social/family history: -She is accompanied by her daughter today. Lives by herself and is independent of ADLs and IADLs. Worked as a Catering manager. -Maternal aunt had ovarian cancer. No other malignancies.   PLAN:  1.Stage IIIc (PT3PN2B) adenocarcinoma of ascending colon: - She does not report any change in bowel habits or bleeding per rectum. -Physical exam today was benign. -CEA was 2.5, reduced from 7.6 in January. -RTC 4 months with repeat CEA and CTAP with contrast.  2. Microcytic anemia: -Her hemoglobin improved to 13.4 from last visit. -She is continuing iron tablet twice daily along with stool softener. -Ferritin  also improved to 45 from 11 previously. -We will likely cut back her iron tablet once a day at next visit.   Orders placed this encounter:  Orders Placed This Encounter  Procedures  . CT Abdomen Pelvis W Contrast  . CBC with Differential/Platelet  . Comprehensive metabolic panel  . Ferritin  . Iron and TIBC  . CEA     Derek Jack, MD [place] [phone]   I, Milinda Antis, am acting as a scribe for Dr. Sanda Linger.  I, Derek Jack MD, have reviewed the above documentation for accuracy and completeness, and I agree with the above.

## 2021-01-03 NOTE — Patient Instructions (Signed)
[place] at [facility] Discharge Instructions  You were seen today by Dr. Delton Coombes. He went over your recent results. Continue taking iron tablets twice daily with the stool softener. You will be scheduled to have a CT scan of your abdomen before your next visit. Dr. Delton Coombes will see you back in 4 months for labs and follow up.   Thank you for choosing [place] at [facility] to provide your oncology and hematology care.  To afford each patient quality time with our provider, please arrive at least 15 minutes before your scheduled appointment time.   If you have a lab appointment with the [facility] please come in thru the Main Entrance and check in at the main information desk  You need to re-schedule your appointment should you arrive 10 or more minutes late.  We strive to give you quality time with our providers, and arriving late affects you and other patients whose appointments are after yours.  Also, if you no show three or more times for appointments you may be dismissed from the clinic at the providers discretion.     Again, thank you for choosing [facility].  Our hope is that these requests will decrease the amount of time that you wait before being seen by our physicians.       _____________________________________________________________  Should you have questions after your visit to [facility], please contact our office at [phone] between the hours of 8:00 a.m. and 4:30 p.m.  Voicemails left after 4:00 p.m. will not be returned until the following business day.  For prescription refill requests, have your pharmacy contact our office and allow 72 hours.    Cancer Center Support Programs:   > Cancer Support Group  2nd Tuesday of the month 1pm-2pm, Journey Room

## 2021-01-09 DIAGNOSIS — I10 Essential (primary) hypertension: Secondary | ICD-10-CM | POA: Diagnosis not present

## 2021-01-15 ENCOUNTER — Other Ambulatory Visit (HOSPITAL_COMMUNITY): Payer: Self-pay | Admitting: Hematology

## 2021-01-15 DIAGNOSIS — C182 Malignant neoplasm of ascending colon: Secondary | ICD-10-CM | POA: Insufficient documentation

## 2021-02-14 ENCOUNTER — Ambulatory Visit (INDEPENDENT_AMBULATORY_CARE_PROVIDER_SITE_OTHER): Payer: Medicare Other | Admitting: Ophthalmology

## 2021-02-14 ENCOUNTER — Encounter (INDEPENDENT_AMBULATORY_CARE_PROVIDER_SITE_OTHER): Payer: Self-pay | Admitting: Ophthalmology

## 2021-02-14 ENCOUNTER — Other Ambulatory Visit: Payer: Self-pay

## 2021-02-14 DIAGNOSIS — H35722 Serous detachment of retinal pigment epithelium, left eye: Secondary | ICD-10-CM

## 2021-02-14 DIAGNOSIS — E1165 Type 2 diabetes mellitus with hyperglycemia: Secondary | ICD-10-CM

## 2021-02-14 DIAGNOSIS — H353132 Nonexudative age-related macular degeneration, bilateral, intermediate dry stage: Secondary | ICD-10-CM

## 2021-02-14 NOTE — Progress Notes (Signed)
02/14/2021     CHIEF COMPLAINT Patient presents for Retina Follow Up (1 Year F/U OU//Pt c/o decreased VA OU with glasses at near and distance gradually over time. Pt reports continued intermittent floaters OU. )   HISTORY OF PRESENT ILLNESS: [name] is a 85 y.o. female who presents to the clinic today for:   HPI    Retina Follow Up    Patient presents with  Dry AMD.  In both eyes.  This started 1 year ago.  Severity is mild.  Duration of 1 year.  Since onset it is gradually worsening. Additional comments: 1 Year F/U OU  Pt c/o decreased VA OU with glasses at near and distance gradually over time. Pt reports continued intermittent floaters OU.        Last edited by Rockie Neighbours, [place] on 02/14/2021 10:33 AM. (History)      Referring physician: Glenda Chroman, MD [place],  [place] [id number]  HISTORICAL INFORMATION:   Selected notes from the MEDICAL RECORD NUMBER    Lab Results  Component Value Date   HGBA1C 7.3 (H) 10/20/2020     CURRENT MEDICATIONS: No current outpatient medications on file. (Ophthalmic Drugs)   No current facility-administered medications for this visit. (Ophthalmic Drugs)   Current Outpatient Medications (Other)  Medication Sig  . acetaminophen (TYLENOL) 500 MG tablet Take 500 mg by mouth every 6 (six) hours as needed for mild pain or moderate pain.   Marland Kitchen amLODipine (NORVASC) 5 MG tablet Take 1 tablet (5 mg total) by mouth at bedtime.  Marland Kitchen aspirin EC 325 MG EC tablet Take 1 tablet (325 mg total) by mouth daily.  . Cholecalciferol (VITAMIN D3) 1000 UNITS CAPS Take 1,000 Units by mouth daily.   . clonazePAM (KLONOPIN) 0.5 MG tablet Take 0.25 mg by mouth at bedtime.  . docusate sodium (COLACE) 100 MG capsule Take 100 mg by mouth at bedtime.   . isosorbide mononitrate (IMDUR) 30 MG 24 hr tablet Take 0.5 tablets (15 mg total) by mouth daily. (Patient taking differently: Take 15 mg by mouth daily at 8 pm.)  . Lactobacillus (PROBIOTIC ACIDOPHILUS PO)  Take 1 tablet by mouth daily with lunch.   . metoprolol tartrate (LOPRESSOR) 50 MG tablet Take 0.5 tablets (25 mg total) by mouth in the morning. & 50 mg at lunch and in the evening (Patient taking differently: Take 25-50 mg by mouth See admin instructions. Take 0.5 tablet (25 mg) by mouth in the morning, take 1 tablet (50 mg) by mouth at 1500 & take 1 tablet (50 mg) by mouth at bedtime.)  . Multiple Vitamins-Minerals (PRESERVISION AREDS 2 PO) Take 1 tablet by mouth in the morning and at bedtime.  . Na Sulfate-K Sulfate-Mg Sulf (SUPREP BOWEL PREP KIT) 17.5-3.13-1.6 GM/177ML SOLN Take 1 kit by mouth as directed.  . nitroGLYCERIN (NITROSTAT) 0.4 MG SL tablet Place 1 tablet (0.4 mg total) under the tongue every 5 (five) minutes as needed for chest pain. MORE THAN 2 DOSES GO TO ED (Patient taking differently: Place 0.4 mg under the tongue every 5 (five) minutes x 3 doses as needed for chest pain. MORE THAN 2 DOSES GO TO ED)  . pantoprazole (PROTONIX) 40 MG tablet Take 40 mg by mouth daily as needed (indigestion/heartburn.).   Marland Kitchen prochlorperazine (COMPAZINE) 10 MG tablet Take 1 tablet (10 mg total) by mouth every 6 (six) hours as needed for nausea or vomiting.  . rosuvastatin (CRESTOR) 20 MG tablet Take 1 tablet (20  mg total) by mouth at bedtime.  . traMADol (ULTRAM) 50 MG tablet TAKE 1 TABLET BY MOUTH EVERY 6 HOURS AS NEEDED FOR UP TO 5 DAYS (SEVERE PAIN NOT CONTROLLED WITH TYLENOL/IBUPROFEN FIRST.   No current facility-administered medications for this visit. (Other)      REVIEW OF SYSTEMS:    ALLERGIES Allergies  Allergen Reactions  . Cefixime Shortness Of Breath    Patient is not familiar with this listed allergy  . Conjugated Estrogens Other (See Comments)    REACTION: flushing  . Morphine And Related Other (See Comments)    Morphine injection Neck pain  . Codeine Other (See Comments)    REACTION: dizziness  . Iohexol      Desc: pt has anxiety and an irregular heartbeat. contrast  exacerbates this. pt was very uncomfortable after a 20cc bolus for an miroi. we did w/o iv 11/09 per dr. Kris Hartmann. pt will talk w/ md about this and will probably not get iv contrast in the future at her re, Onset Date: [id number] Multi CT's with contrast done at [facility] without pre meds, Heart Cath without pre med documented 2021. CT A/P with contrast without premeds 11/2020 without issues    . Adenosine Other (See Comments)    Elevated heart rate when doing stress test    PAST MEDICAL HISTORY Past Medical History:  Diagnosis Date  . Anemia   . Anxiety   . Arthritis   . CAD (coronary artery disease)    Status post CABG April 2021 - Dr. Servando Snare  . Chronic constipation   . Claudication [facility])    Chronic occlusion of the right limb of aortic stent graft.  . Colon cancer ([place])   . Diverticulosis    Left sided noted on colonoscopy 02/2006  . Ectopic atrial tachycardia ([place])   . Esophageal reflux disease   . Essential hypertension   . Hiatal hernia   . History of TIA (transient ischemic attack)   . Hyperlipidemia   . Lumbar radiculopathy   . Macular degeneration   . Migraines   . Penetrating atherosclerotic ulcer of aorta (HCC)    Complex ulcerated plaque of the infrarenal abdominal aorta mild renal artery stenosis on the right side, normal left renal artery catheterization 2009, status post aortic stent graft  . Renal artery stenosis ([place])    a. Right renal artery stent April 2013 - Dr. Fletcher Anon;  b. 03/2014 PTA/stenting of RRA 2/2 ISR: 6x18 Herculink stent.  . Trigeminal neuralgia    Right  . Type 2 diabetes mellitus ([place])    Past Surgical History:  Procedure Laterality Date  . ABDOMINAL AORTAGRAM N/A 02/13/2012   Procedure: ABDOMINAL Maxcine Ham;  Surgeon: Wellington Hampshire, MD;  Location: [facility];  Service: Cardiovascular;  Laterality: N/A;  . ABDOMINAL AORTAGRAM N/A 03/17/2014   Procedure: ABDOMINAL Maxcine Ham;  Surgeon: Wellington Hampshire, MD;  Location: [facility] LAB;  Service: Cardiovascular;   Laterality: N/A;  . ABDOMINAL HYSTERECTOMY  1982  . BIOPSY  09/15/2020   Procedure: BIOPSY;  Surgeon: [name], MD;  Location: [facility];  Service: Endoscopy;;  . BREAST BIOPSY Left 1990's  . CAROTID ENDARTERECTOMY Left 1993  . COLONOSCOPY    . COLONOSCOPY WITH PROPOFOL N/A 09/15/2020   Procedure: COLONOSCOPY WITH PROPOFOL;  Surgeon: [name], MD;  Location: [facility];  Service: Endoscopy;  Laterality: N/A;  . CORONARY ARTERY BYPASS GRAFT N/A 03/02/2020   Procedure: CORONARY ARTERY BYPASS GRAFTING (CABG)x 3, LIMA TO LAD, SVG TO DIAGONAL,  SVG TO RCA, WITH OPEN HARVESTING OF LEFT LOWER GREATER SAPHENOUS VEIN AND RIGHT AND LEFT GREATER SAPHENOUS VEIN HARVESTED ENDOSCOPICALLY;  Surgeon: Grace Isaac, MD;  Location: [facility];  Service: Open Heart Surgery;  Laterality: N/A;  . Endologix powerlink bifurcated     Covered stent graft  . ENDOVEIN HARVEST OF GREATER SAPHENOUS VEIN Bilateral 03/02/2020   Procedure: [place] Of Greater Saphenous Vein;  Surgeon: Grace Isaac, MD;  Location: [facility];  Service: Open Heart Surgery;  Laterality: Bilateral;  . LAPAROSCOPIC RIGHT HEMI COLECTOMY Right 10/28/2020   Procedure: LAPAROSCOPIC RIGHT HEMI COLECTOMY,;  Surgeon: Ileana Roup, MD;  Location: [facility];  Service: General;  Laterality: Right;  . LEFT HEART CATH AND CORONARY ANGIOGRAPHY N/A 01/28/2020   Procedure: LEFT HEART CATH AND CORONARY ANGIOGRAPHY;  Surgeon: Nelva Bush, MD;  Location: [facility];  Service: Cardiovascular;  Laterality: N/A;  . LEFT HEART CATHETERIZATION WITH CORONARY ANGIOGRAM N/A 03/17/2014   Procedure: LEFT HEART CATHETERIZATION WITH CORONARY ANGIOGRAM;  Surgeon: Wellington Hampshire, MD;  Location: [facility] CATH LAB;  Service: Cardiovascular;  Laterality: N/A;  . LUMBAR [place]  . PERCUTANEOUS STENT INTERVENTION Right 03/17/2014   Procedure: PERCUTANEOUS STENT INTERVENTION;  Surgeon: Wellington Hampshire, MD;  Location: [facility] CATH LAB;   Service: Cardiovascular;  Laterality: Right;  Renal  . RENAL ANGIOGRAM N/A 02/13/2012   Procedure: RENAL ANGIOGRAM;  Surgeon: Wellington Hampshire, MD;  Location: [facility] CATH LAB;  Service: Cardiovascular;  Laterality: N/A;  . RENAL ANGIOGRAM N/A 03/17/2014   Procedure: RENAL ANGIOGRAM;  Surgeon: Wellington Hampshire, MD;  Location: [facility] CATH LAB;  Service: Cardiovascular;  Laterality: N/A;  . SCLEROTHERAPY  09/15/2020   Procedure: SCLEROTHERAPY;  Surgeon: [name], MD;  Location: [facility];  Service: Endoscopy;;  . TEE WITHOUT CARDIOVERSION N/A 03/02/2020   Procedure: TRANSESOPHAGEAL ECHOCARDIOGRAM (TEE);  Surgeon: Grace Isaac, MD;  Location: [facility];  Service: Open Heart Surgery;  Laterality: N/A;  . TONSILLECTOMY AND ADENOIDECTOMY  1947    FAMILY HISTORY Family History  Problem Relation Age of Onset  . Heart failure Mother   . Coronary artery disease Mother   . Heart disease Mother   . Hypertension Mother   . Stroke Mother   . Heart attack Father   . Heart disease Father   . Hypertension Father   . Heart disease Brother   . Hypertension Sister   . Parkinson's disease Sister   . Kidney disease Sister   . Stroke Sister   . Hypertension Brother   . Stroke Sister   . Hypertension Sister   . Breast cancer Neg Hx     SOCIAL HISTORY Social History   Tobacco Use  . Smoking status: Former Smoker    Packs/day: 0.20    Years: 1.00    Pack years: 0.20    Types: Cigarettes    Quit date: 11/13/1979    Years since quitting: 41.2  . Smokeless tobacco: Never Used  . Tobacco comment: "smoked a few months"  Vaping Use  . Vaping Use: Never used  Substance Use Topics  . Alcohol use: No    Alcohol/week: 0.0 standard drinks  . Drug use: Never         OPHTHALMIC EXAM:  Base Eye Exam    Visual Acuity (ETDRS)      Right Left   Dist [place] 20/30ecc +1 20/40 +2   Dist ph [place] 20/25ecc -2 20/20 -2  Tonometry (Tonopen, 10:32 AM)      Right Left   Pressure 07 07       Pupils       Dark Light Shape React APD   Right 4 4 Round Sluggish None   Left 3 3 Round Sluggish None       Visual Fields (Counting fingers)      Left Right    Full Full       Extraocular Movement      Right Left    Full Full       Neuro/Psych    Oriented x3: Yes   Mood/Affect: Normal       Dilation    Both eyes: 1.0% Mydriacyl, 2.5% Phenylephrine @ 10:37 AM        Slit Lamp and Fundus Exam    External Exam      Right Left   External Normal Normal       Slit Lamp Exam      Right Left   Lids/Lashes Normal Normal   Conjunctiva/Sclera White and quiet White and quiet   Cornea Clear Clear   Anterior Chamber Deep and quiet Deep and quiet   Iris Round and reactive Round and reactive   Lens Posterior chamber intraocular lens, Open posterior capsule Posterior chamber intraocular lens, Open posterior capsule   Anterior Vitreous Posterior vitreous detachment Posterior vitreous detachment       Fundus Exam      Right Left   Posterior Vitreous Posterior vitreous detachment Posterior vitreous detachment   Disc Normal Normal   C/D Ratio 0.4 0.4   Macula Hard drusen, Age related macular degeneration, Early age related macular degeneration, Mottling, Retinal pigment epithelial mottling, no macular thickening, no hemorrhage Hard drusen, Age related macular degeneration some minor atrophy around the foveal region, Early age related macular degeneration, Mottling, Retinal pigment epithelial mottling, no macular thickening, no hemorrhage   Vessels Normal, no DR Normal, no DR   Periphery Normal Normal          IMAGING AND PROCEDURES  Imaging and Procedures for 02/14/21  OCT, Retina - OU - Both Eyes       Right Eye Quality was good. Scan locations included subfoveal. Central Foveal Thickness: 259. Progression has been stable. Findings include pigment epithelial detachment, no SRF, no IRF, retinal drusen .   Left Eye Quality was good. Scan locations included subfoveal. Central Foveal  Thickness: 258. Progression has been stable. Findings include no IRF, no SRF, retinal drusen .   Notes No signs of active disease.,  Small PED inferotemporal to the fovea OD, no vascularization, no CNVM stable over the last year  OS with small serous looking RPE change subfoveal but no change over the last several years and not active CNVM                ASSESSMENT/PLAN:  Intermediate stage nonexudative age-related macular degeneration of both eyes The nature of age realated macular degeneration (ARMD)is explained as follows: The dry form refers to the progressive loss of normal blood supply to the central vision as a result of a combination of factors which include aging blood supply (arteriosclerosis, hardening of the arteries), genetics, smoking habits, and history of hypertension. Currently, no eye medications or vitamins slow this type of aging effect upon vision, however cessation of smoking and controlling hypertension help slow the disorder. The following analogy helps explain this: I describe the dry form of ARMD like a house of the same age as  your eyes, which shows typical wear and tear of age upon the house structure and appearance. Like the aging house which can fall down structurally, the dry form of ARMD can deteriorate the structure of the macula (center) of the retina, most often gradually and affect central vision tasks such as reading and driving. The wet form of ARMD refers to the development of abnormally growing blood vessels usually near or under the central vision, with potential risk of permanent visual changes or vision losses. This complication of the Dry form of ARMD may be moderately reduced by use of AREDS 2 formula multivitamins daily. I describe the Wet form of ARMD as like the development of a fire in an aging house (DRY ARMD). It may develop suddenly, progress rapidly and be destructive based on where it starts and how big the fire is when found. In the eye, the  house fire analogy refers to the abnormal blood vessels growing destructively near the central vison in the retina, or film of the eye. Halting the growth of blood vessels with laser (hot or cold) or injectable medications is best way "to put the fire out". Many patients will experience a stabilization or even improvement in vision with a treatment course, while others may still face a loss of vision. The use of injectable medications has revolutionized therapy and is currently the only proven therapy to provide the chance of stable or improved acuity from new and recent destructive wet ARMD.    Serous detachment of retinal pigment epithelium of left eye No active disease  Type 2 diabetes mellitus with hyperglycemia (HCC) No detectable diabetic retinopathyThe patient has diabetes without any evidence of retinopathy. The patient advised to maintain good blood glucose control, excellent blood pressure control, and favorable levels of cholesterol, low density lipoprotein, and high density lipoproteins. Follow up in 1 year was recommended. Explained that fluctuations in visual acuity , or "out of focus", may result from large variations of blood sugar control.      ICD-10-CM   1. Intermediate stage nonexudative age-related macular degeneration of both eyes  H35.3132 OCT, Retina - OU - Both Eyes  2. Serous detachment of retinal pigment epithelium of left eye  H35.722   3. Type 2 diabetes mellitus with hyperglycemia, without long-term current use of insulin (HCC)  E11.65     1.  No active ARMD at this time.  We will continue to observe  2.  No detectable diabetic retinopathy  3.  Ophthalmic Meds Ordered this visit:  No orders of the defined types were placed in this encounter.      Return in about 1 year (around 02/14/2022) for DILATE OU, OCT.  There are no Patient Instructions on file for this visit.   Explained the diagnoses, plan, and follow up with the patient and they expressed  understanding.  Patient expressed understanding of the importance of proper follow up care.   Clent Demark Weslie Rasmus M.D. Diseases & Surgery of the Retina and Vitreous Retina & Diabetic [place] 02/14/21     Abbreviations: M myopia (nearsighted); A astigmatism; H hyperopia (farsighted); P presbyopia; Mrx spectacle prescription;  CTL contact lenses; OD right eye; OS left eye; OU both eyes  XT exotropia; ET esotropia; PEK punctate epithelial keratitis; PEE punctate epithelial erosions; DES dry eye syndrome; MGD meibomian gland dysfunction; ATs artificial tears; PFAT's preservative free artificial tears; [place] nuclear sclerotic cataract; PSC posterior subcapsular cataract; ERM epi-retinal membrane; PVD posterior vitreous detachment; RD retinal detachment; DM diabetes mellitus; DR  diabetic retinopathy; NPDR non-proliferative diabetic retinopathy; PDR proliferative diabetic retinopathy; CSME clinically significant macular edema; DME diabetic macular edema; dbh dot blot hemorrhages; CWS cotton wool spot; POAG primary open angle glaucoma; C/D cup-to-disc ratio; HVF humphrey visual field; GVF goldmann visual field; OCT optical coherence tomography; IOP intraocular pressure; BRVO Branch retinal vein occlusion; CRVO central retinal vein occlusion; CRAO central retinal artery occlusion; BRAO branch retinal artery occlusion; RT retinal tear; SB scleral buckle; PPV pars plana vitrectomy; VH Vitreous hemorrhage; PRP panretinal laser photocoagulation; IVK intravitreal kenalog; VMT vitreomacular traction; MH Macular hole;  NVD neovascularization of the disc; NVE neovascularization elsewhere; AREDS age related eye disease study; ARMD age related macular degeneration; POAG primary open angle glaucoma; EBMD epithelial/anterior basement membrane dystrophy; ACIOL anterior chamber intraocular lens; IOL intraocular lens; PCIOL posterior chamber intraocular lens; Phaco/IOL phacoemulsification with intraocular lens placement; [place]  photorefractive keratectomy; LASIK laser assisted in situ keratomileusis; HTN hypertension; DM diabetes mellitus; COPD chronic obstructive pulmonary disease

## 2021-02-14 NOTE — Assessment & Plan Note (Signed)
No active disease 

## 2021-02-14 NOTE — Assessment & Plan Note (Signed)
No detectable diabetic retinopathy  The patient has diabetes without any evidence of retinopathy. The patient advised to maintain good blood glucose control, excellent blood pressure control, and favorable levels of cholesterol, low density lipoprotein, and high density lipoproteins. Follow up in 1 year was recommended. Explained that fluctuations in visual acuity , or "out of focus", may result from large variations of blood sugar control. 

## 2021-02-14 NOTE — Assessment & Plan Note (Signed)
The nature of age realated macular degeneration (ARMD)is explained as follows: The dry form refers to the progressive loss of normal blood supply to the central vision as a result of a combination of factors which include aging blood supply (arteriosclerosis, hardening of the arteries), genetics, smoking habits, and history of hypertension. Currently, no eye medications or vitamins slow this type of aging effect upon vision, however cessation of smoking and controlling hypertension help slow the disorder. The following analogy helps explain this: I describe the dry form of ARMD like a house of the same age as your eyes, which shows typical wear and tear of age upon the house structure and appearance. Like the aging house which can fall down structurally, the dry form of ARMD can deteriorate the structure of the macula (center) of the retina, most often gradually and affect central vision tasks such as reading and driving. The wet form of ARMD refers to the development of abnormally growing blood vessels usually near or under the central vision, with potential risk of permanent visual changes or vision losses. This complication of the Dry form of ARMD may be moderately reduced by use of AREDS 2 formula multivitamins daily. I describe the Wet form of ARMD as like the development of a fire in an aging house (DRY ARMD). It may develop suddenly, progress rapidly and be destructive based on where it starts and how big the fire is when found. In the eye, the house fire analogy refers to the abnormal blood vessels growing destructively near the central vison in the retina, or film of the eye. Halting the growth of blood vessels with laser (hot or cold) or injectable medications is best way "to put the fire out". Many patients will experience a stabilization or even improvement in vision with a treatment course, while others may still face a loss of vision. The use of injectable medications has revolutionized therapy and is  currently the only proven therapy to provide the chance of stable or improved acuity from new and recent destructive wet ARMD. 

## 2021-02-21 DIAGNOSIS — I779 Disorder of arteries and arterioles, unspecified: Secondary | ICD-10-CM | POA: Diagnosis not present

## 2021-02-21 DIAGNOSIS — E1165 Type 2 diabetes mellitus with hyperglycemia: Secondary | ICD-10-CM | POA: Diagnosis not present

## 2021-02-21 DIAGNOSIS — I701 Atherosclerosis of renal artery: Secondary | ICD-10-CM | POA: Diagnosis not present

## 2021-02-21 DIAGNOSIS — C189 Malignant neoplasm of colon, unspecified: Secondary | ICD-10-CM | POA: Diagnosis not present

## 2021-02-21 DIAGNOSIS — I10 Essential (primary) hypertension: Secondary | ICD-10-CM | POA: Diagnosis not present

## 2021-02-21 DIAGNOSIS — Z299 Encounter for prophylactic measures, unspecified: Secondary | ICD-10-CM | POA: Diagnosis not present

## 2021-03-02 ENCOUNTER — Other Ambulatory Visit: Payer: Self-pay | Admitting: Cardiology

## 2021-03-03 ENCOUNTER — Other Ambulatory Visit: Payer: Self-pay | Admitting: Cardiology

## 2021-03-10 DIAGNOSIS — I10 Essential (primary) hypertension: Secondary | ICD-10-CM | POA: Diagnosis not present

## 2021-03-15 DIAGNOSIS — L820 Inflamed seborrheic keratosis: Secondary | ICD-10-CM | POA: Diagnosis not present

## 2021-03-15 DIAGNOSIS — I10 Essential (primary) hypertension: Secondary | ICD-10-CM | POA: Diagnosis not present

## 2021-03-15 DIAGNOSIS — Z299 Encounter for prophylactic measures, unspecified: Secondary | ICD-10-CM | POA: Diagnosis not present

## 2021-03-15 DIAGNOSIS — D485 Neoplasm of uncertain behavior of skin: Secondary | ICD-10-CM | POA: Diagnosis not present

## 2021-03-23 ENCOUNTER — Other Ambulatory Visit: Payer: Self-pay | Admitting: Cardiology

## 2021-04-11 DIAGNOSIS — I10 Essential (primary) hypertension: Secondary | ICD-10-CM | POA: Diagnosis not present

## 2021-04-24 ENCOUNTER — Other Ambulatory Visit (HOSPITAL_COMMUNITY): Payer: Medicare Other

## 2021-04-25 ENCOUNTER — Inpatient Hospital Stay (HOSPITAL_COMMUNITY): Payer: Medicare Other | Attending: Hematology

## 2021-04-25 ENCOUNTER — Ambulatory Visit (HOSPITAL_COMMUNITY)
Admission: RE | Admit: 2021-04-25 | Discharge: 2021-04-25 | Disposition: A | Discharge status: Home / Self Care | Payer: Medicare Other | Source: Ambulatory Visit | Attending: Hematology | Admitting: Hematology

## 2021-04-25 ENCOUNTER — Other Ambulatory Visit: Payer: Self-pay

## 2021-04-25 ENCOUNTER — Telehealth (HOSPITAL_COMMUNITY): Payer: Self-pay | Admitting: *Deleted

## 2021-04-25 DIAGNOSIS — C182 Malignant neoplasm of ascending colon: Secondary | ICD-10-CM

## 2021-04-25 DIAGNOSIS — C189 Malignant neoplasm of colon, unspecified: Secondary | ICD-10-CM | POA: Diagnosis not present

## 2021-04-25 DIAGNOSIS — D509 Iron deficiency anemia, unspecified: Secondary | ICD-10-CM | POA: Insufficient documentation

## 2021-04-25 DIAGNOSIS — N8110 Cystocele, unspecified: Secondary | ICD-10-CM | POA: Diagnosis not present

## 2021-04-25 DIAGNOSIS — N2889 Other specified disorders of kidney and ureter: Secondary | ICD-10-CM | POA: Diagnosis not present

## 2021-04-25 DIAGNOSIS — C19 Malignant neoplasm of rectosigmoid junction: Secondary | ICD-10-CM | POA: Diagnosis not present

## 2021-04-25 LAB — IRON AND TIBC
Iron: 79 ug/dL (ref 28–170)
Saturation Ratios: 20 % (ref 10.4–31.8)
TIBC: 404 ug/dL (ref 250–450)
UIBC: 325 ug/dL

## 2021-04-25 LAB — CBC WITH DIFFERENTIAL/PLATELET
Abs Immature Granulocytes: 0.02 10*3/uL (ref 0.00–0.07)
Basophils Absolute: 0 10*3/uL (ref 0.0–0.1)
Basophils Relative: 0 %
Eosinophils Absolute: 0.1 10*3/uL (ref 0.0–0.5)
Eosinophils Relative: 2 %
HCT: 42.9 % (ref 36.0–46.0)
Hemoglobin: 13.9 g/dL (ref 12.0–15.0)
Immature Granulocytes: 0 %
Lymphocytes Relative: 23 %
Lymphs Abs: 1 10*3/uL (ref 0.7–4.0)
MCH: 28.5 pg (ref 26.0–34.0)
MCHC: 32.4 g/dL (ref 30.0–36.0)
MCV: 88.1 fL (ref 80.0–100.0)
Monocytes Absolute: 0.3 10*3/uL (ref 0.1–1.0)
Monocytes Relative: 7 %
Neutro Abs: 3 10*3/uL (ref 1.7–7.7)
Neutrophils Relative %: 68 %
Platelets: 166 10*3/uL (ref 150–400)
RBC: 4.87 MIL/uL (ref 3.87–5.11)
RDW: 15.3 % (ref 11.5–15.5)
WBC: 4.5 10*3/uL (ref 4.0–10.5)
nRBC: 0 % (ref 0.0–0.2)

## 2021-04-25 LAB — COMPREHENSIVE METABOLIC PANEL
ALT: 29 U/L (ref 0–44)
AST: 26 U/L (ref 15–41)
Albumin: 4.7 g/dL (ref 3.5–5.0)
Alkaline Phosphatase: 86 U/L (ref 38–126)
Anion gap: 9 (ref 5–15)
BUN: 23 mg/dL (ref 8–23)
CO2: 27 mmol/L (ref 22–32)
Calcium: 10.3 mg/dL (ref 8.9–10.3)
Chloride: 100 mmol/L (ref 98–111)
Creatinine, Ser: 0.72 mg/dL (ref 0.44–1.00)
GFR, Estimated: >60 mL/min (ref >60)
Glucose, Bld: 115 mg/dL — ABNORMAL HIGH (ref 70–99)
Potassium: 4.1 mmol/L (ref 3.5–5.1)
Sodium: 136 mmol/L (ref 135–145)
Total Bilirubin: 0.9 mg/dL (ref 0.3–1.2)
Total Protein: 7.3 g/dL (ref 6.5–8.1)

## 2021-04-25 LAB — FERRITIN: Ferritin: 114 ng/mL (ref 11–307)

## 2021-04-25 MED ORDER — IOHEXOL 300 MG/ML  SOLN
100.0000 mL | Freq: Once | INTRAMUSCULAR | Status: AC | PRN
  Administered 2021-04-25: 80 mL via INTRAVENOUS

## 2021-04-25 NOTE — Telephone Encounter (Signed)
[facility] called in results report of Ct scan. Results printed off and given to Dr. Delton Coombes.

## 2021-04-26 LAB — CEA: CEA: 1.9 ng/mL (ref 0.0–4.7)

## 2021-05-02 ENCOUNTER — Inpatient Hospital Stay (HOSPITAL_BASED_OUTPATIENT_CLINIC_OR_DEPARTMENT_OTHER): Payer: Medicare Other | Admitting: Hematology and Oncology

## 2021-05-02 ENCOUNTER — Encounter (HOSPITAL_COMMUNITY): Payer: Self-pay | Admitting: Hematology and Oncology

## 2021-05-02 ENCOUNTER — Other Ambulatory Visit: Payer: Self-pay

## 2021-05-02 VITALS — BP 180/67 | HR 73 | Temp 97.0°F | Resp 18 | Wt 118.6 lb

## 2021-05-02 DIAGNOSIS — D509 Iron deficiency anemia, unspecified: Secondary | ICD-10-CM | POA: Diagnosis not present

## 2021-05-02 DIAGNOSIS — C182 Malignant neoplasm of ascending colon: Secondary | ICD-10-CM | POA: Diagnosis not present

## 2021-05-02 NOTE — Progress Notes (Signed)
[name], [place] [address]   CLINIC:  Medical Oncology/Hematology  PCP:  Glenda Chroman, MD [address] / [place] [address] [phone]   REASON FOR VISIT:  Follow-up for right colon cancer  PRIOR THERAPY: Right hemicolectomy on 10/28/2020  NGS Results: Loss of MLH1 and PMS2, BRAF V 600 E mutation positive, MSI--high  CURRENT THERAPY: continue monitoring  BRIEF ONCOLOGIC HISTORY:  Oncology History  Cancer of ascending colon ([facility])  01/15/2021 Initial Diagnosis   Cancer of ascending colon ([facility])   01/15/2021 Cancer Staging   Staging form: Colon and Rectum, AJCC 8th Edition - Clinical stage from 01/15/2021: Stage IIIC (cT3, cN2b, cM0) - Signed by Derek Jack, MD on 01/15/2021 Stage prefix: Initial diagnosis Microsatellite instability (MSI): Unstable high     CANCER STAGING: Cancer Staging Cancer of ascending colon [facility]) Staging form: Colon and Rectum, AJCC 8th Edition - Clinical stage from 01/15/2021: Stage IIIC (cT3, cN2b, cM0) - Signed by Derek Jack, MD on 01/15/2021  INTERVAL HISTORY:  [name], a 85 y.o. female, returns for routine follow-up of her right colon cancer. [name] was last seen on 01/03/2021.   On exam today [name] is accompanied by her daughter.  She reports that she feels quite good.  Her appetite is good and her energy levels are great.  She reports that she takes about 1 iron pill per day and it does not cause any stomach upset or constipation/diarrhea.  She reports that she does get tired if she exerts herself.  She denies having any issues with shortness of breath.  Her stools are dark but this is likely secondary to the iron therapy.  She otherwise denies any fevers, chills, sweats, nausea, vomiting or diarrhea.  Her weight is been stable.  A full 10 point ROS is listed below.   REVIEW OF SYSTEMS:  Review of Systems  Constitutional:  Positive for fatigue (90%). Negative for appetite  change.  Gastrointestinal:  Positive for abdominal pain (6/10 LLQ pain occasionally). Negative for constipation, diarrhea, nausea and vomiting.  Musculoskeletal:  Positive for back pain (6/10 back pain).  Neurological:  Positive for headaches and numbness (fingertips).  All other systems reviewed and are negative.  PAST MEDICAL/SURGICAL HISTORY:  Past Medical History:  Diagnosis Date   Anemia    Anxiety    Arthritis    CAD (coronary artery disease)    Status post CABG April 2021 - Dr. Servando Snare   Chronic constipation    Claudication [facility])    Chronic occlusion of the right limb of aortic stent graft.   Colon cancer ([facility])    Diverticulosis    Left sided noted on colonoscopy 02/2006   Ectopic atrial tachycardia (H[facility])    Esophageal reflux disease    Essential hypertension    Hiatal hernia    History of TIA (transient ischemic attack)    Hyperlipidemia    Lumbar radiculopathy    Macular degeneration    Migraines    Penetrating atherosclerotic ulcer of aorta ([facility])    Complex ulcerated plaque of the infrarenal abdominal aorta mild renal artery stenosis on the right side, normal left renal artery catheterization 2009, status post aortic stent graft   Renal artery stenosis ([place])    a. Right renal artery stent April 2013 - Dr. Fletcher Anon;  b. 03/2014 PTA/stenting of RRA 2/2 ISR: 6x18 Herculink stent.   Trigeminal neuralgia    Right   Type 2 diabetes mellitus ([facility])  Past Surgical History:  Procedure Laterality Date   ABDOMINAL AORTAGRAM N/A 02/13/2012   Procedure: ABDOMINAL AORTAGRAM;  Surgeon: Wellington Hampshire, MD;  Location: [facility] [place] CATH LAB;  Service: Cardiovascular;  Laterality: N/A;   ABDOMINAL AORTAGRAM N/A 03/17/2014   Procedure: ABDOMINAL Maxcine Ham;  Surgeon: Wellington Hampshire, MD;  Location: [place] CATH LAB;  Service: Cardiovascular;  Laterality: N/A;   ABDOMINAL HYSTERECTOMY  1982   BIOPSY  09/15/2020   Procedure: BIOPSY;  Surgeon: [name], MD;  Location: [facility] ENDOSCOPY;  Service:  Endoscopy;;   BREAST BIOPSY Left 1990's   CAROTID ENDARTERECTOMY Left 1993   COLONOSCOPY     COLONOSCOPY WITH PROPOFOL N/A 09/15/2020   Procedure: COLONOSCOPY WITH PROPOFOL;  Surgeon: [name], MD;  [facility]: [facility] ENDOSCOPY;  Service: Endoscopy;  Laterality: N/A;   CORONARY ARTERY BYPASS GRAFT N/A 03/02/2020   Procedure: CORONARY ARTERY BYPASS GRAFTING (CABG)x 3, LIMA TO LAD, SVG TO DIAGONAL, SVG TO RCA, WITH OPEN HARVESTING OF LEFT LOWER GREATER SAPHENOUS VEIN AND RIGHT AND LEFT GREATER SAPHENOUS VEIN HARVESTED ENDOSCOPICALLY;  Surgeon: Grace Isaac, MD;  Location: [facility];  Service: Open Heart Surgery;  Laterality: N/A;   Endologix powerlink bifurcated     Covered stent graft   ENDOVEIN HARVEST OF GREATER SAPHENOUS VEIN Bilateral 03/02/2020   Procedure: [place] Of Greater Saphenous Vein;  Surgeon: Grace Isaac, MD;  Location: [facility];  Service: Open Heart Surgery;  Laterality: Bilateral;   LAPAROSCOPIC RIGHT HEMI COLECTOMY Right 10/28/2020   Procedure: LAPAROSCOPIC RIGHT HEMI COLECTOMY,;  Surgeon: Ileana Roup, MD;  [facility] ORS;  Service: General;  Laterality: Right;   LEFT HEART CATH AND CORONARY ANGIOGRAPHY N/A 01/28/2020   Procedure: LEFT HEART CATH AND CORONARY ANGIOGRAPHY;  Surgeon: Nelva Bush, MD;  Location: [facility] CV LAB;  Service: Cardiovascular;  Laterality: N/A;   LEFT HEART CATHETERIZATION WITH CORONARY ANGIOGRAM N/A 03/17/2014   Procedure: LEFT HEART CATHETERIZATION WITH CORONARY ANGIOGRAM;  Surgeon: Wellington Hampshire, MD;  Location: [facility] CATH LAB;  Service: Cardiovascular;  Laterality: N/A;   LUMBAR DISC SURGERY  1981   PERCUTANEOUS STENT INTERVENTION Right 03/17/2014   Procedure: PERCUTANEOUS STENT INTERVENTION;  Surgeon: Wellington Hampshire, MD;  Location: [facility] CATH LAB;  Service: Cardiovascular;  Laterality: Right;  Renal   RENAL ANGIOGRAM N/A 02/13/2012   Procedure: RENAL ANGIOGRAM;  Surgeon: Wellington Hampshire, MD;  Location: [facility] CATH LAB;  Service:  Cardiovascular;  Laterality: N/A;   RENAL ANGIOGRAM N/A 03/17/2014   Procedure: RENAL ANGIOGRAM;  Surgeon: Wellington Hampshire, MD;  Location: [facility] CATH LAB;  Service: Cardiovascular;  Laterality: N/A;   SCLEROTHERAPY  09/15/2020   Procedure: SCLEROTHERAPY;  Surgeon: [name], MD;  [facility];  Service: Endoscopy;;   TEE WITHOUT CARDIOVERSION N/A 03/02/2020   Procedure: TRANSESOPHAGEAL ECHOCARDIOGRAM (TEE);  Surgeon: Grace Isaac, MD;  Location: [facility];  Service: Open Heart Surgery;  Laterality: N/A;   TONSILLECTOMY AND ADENOIDECTOMY  1947    SOCIAL HISTORY:  Social History   Socioeconomic History   Marital status: Widowed    Spouse name: Not on file   Number of children: 1   Years of education: 15   Highest education level: Not on file  Occupational History   Occupation: Retired    Comment: Scientist, clinical (histocompatibility and immunogenetics) at [employer] Use   Smoking status: Former    Packs/day: 0.20    Years: 1.00    Pack years: 0.20    Types: Cigarettes    Quit  date: 11/13/1979    Years since quitting: 41.4   Smokeless tobacco: Never   Tobacco comments:    "smoked a few months"  Vaping Use   Vaping Use: Never used  Substance and Sexual Activity   Alcohol use: No    Alcohol/week: 0.0 standard drinks   Drug use: Never   Sexual activity: Not on file    Comment: Hysterectomy  Other Topics Concern   Not on file  Social History Narrative   Drinks 1/2 caf coffee 3-4 cups a day    Social Determinants of Health   Financial Resource Strain: Low Risk    Difficulty of Paying Living Expenses: Not hard at all  Food Insecurity: No Food Insecurity   Worried About Charity fundraiser in the Last Year: Never true   Arboriculturist in the Last Year: Never true  Transportation Needs: No Transportation Needs   Lack of Transportation (Medical): No   Lack of Transportation (Non-Medical): No  Physical Activity: Inactive   Days of Exercise per Week: 0 days   Minutes of Exercise per Session: 0 min   Stress: Stress Concern Present   Feeling of Stress : To some extent  Social Connections: Moderately Isolated   Frequency of Communication with Friends and Family: Three times a week   Frequency of Social Gatherings with Friends and Family: Three times a week   Attends Religious Services: More than 4 times per year   Active Member of Clubs or Organizations: No   Attends Archivist Meetings: Never   Marital Status: Widowed  Human resources officer Violence: Not At Risk   Fear of Current or Ex-Partner: No   Emotionally Abused: No   Physically Abused: No   Sexually Abused: No    FAMILY HISTORY:  Family History  Problem Relation Age of Onset   Heart failure Mother    Coronary artery disease Mother    Heart disease Mother    Hypertension Mother    Stroke Mother    Heart attack Father    Heart disease Father    Hypertension Father    Heart disease Brother    Hypertension Sister    Parkinson's disease Sister    Kidney disease Sister    Stroke Sister    Hypertension Brother    Stroke Sister    Hypertension Sister    Breast cancer Neg Hx     CURRENT MEDICATIONS:  Current Outpatient Medications  Medication Sig Dispense Refill   acetaminophen (TYLENOL) 500 MG tablet Take 500 mg by mouth every 6 (six) hours as needed for mild pain or moderate pain.      amLODipine (NORVASC) 5 MG tablet Take 1 tablet (5 mg total) by mouth at bedtime. 90 tablet 3   aspirin EC 325 MG EC tablet Take 1 tablet (325 mg total) by mouth daily. 30 tablet 0   Cholecalciferol (VITAMIN D3) 1000 UNITS CAPS Take 1,000 Units by mouth daily.      clonazePAM (KLONOPIN) 0.5 MG tablet Take 0.25 mg by mouth at bedtime.     docusate sodium (COLACE) 100 MG capsule Take 100 mg by mouth at bedtime.      isosorbide mononitrate (IMDUR) 30 MG 24 hr tablet TAKE 1/2 TABLET BY MOUTH EVERY DAY 45 tablet 1   metoprolol tartrate (LOPRESSOR) 50 MG tablet Take 0.5-1 tablets (25-50 mg total) by mouth See admin instructions. Take  0.5 tablet (25 mg) by mouth in the morning, take 1 tablet (50 mg) by mouth  at 1500 & take 1 tablet (50 mg) by mouth at bedtime. 270 tablet 1   Multiple Vitamins-Minerals (PRESERVISION AREDS 2 PO) Take 1 tablet by mouth in the morning and at bedtime.     Na Sulfate-K Sulfate-Mg Sulf (SUPREP BOWEL PREP KIT) 17.5-3.13-1.6 GM/177ML SOLN Take 1 kit by mouth as directed. 324 mL 0   pantoprazole (PROTONIX) 40 MG tablet Take 40 mg by mouth daily as needed (indigestion/heartburn.).      rosuvastatin (CRESTOR) 20 MG tablet TAKE 1 TABLET BY MOUTH AT BEDTIME 90 tablet 3   Lactobacillus (PROBIOTIC ACIDOPHILUS PO) Take 1 tablet by mouth daily with lunch.  (Patient not taking: Reported on 05/02/2021)     nitroGLYCERIN (NITROSTAT) 0.4 MG SL tablet Place 1 tablet (0.4 mg total) under the tongue every 5 (five) minutes as needed for chest pain. MORE THAN 2 DOSES GO TO ED (Patient taking differently: Place 0.4 mg under the tongue every 5 (five) minutes x 3 doses as needed for chest pain. MORE THAN 2 DOSES GO TO ED) 25 tablet 3   prochlorperazine (COMPAZINE) 10 MG tablet Take 1 tablet (10 mg total) by mouth every 6 (six) hours as needed for nausea or vomiting. (Patient not taking: Reported on 05/02/2021) 30 tablet 0   No current facility-administered medications for this visit.    ALLERGIES:  Allergies  Allergen Reactions   Cefixime Shortness Of Breath    Patient is not familiar with this listed allergy   Conjugated Estrogens Other (See Comments)    REACTION: flushing   Morphine And Related Other (See Comments)    Morphine injection Neck pain   Codeine Other (See Comments)    REACTION: dizziness   Iohexol      Desc: pt has anxiety and an irregular heartbeat. contrast exacerbates this. pt was very uncomfortable after a 20cc bolus for an miroi. we did w/o iv 11/09 per dr. Kris Hartmann. pt will talk w/ md about this and will probably not get iv contrast in the future at her re, Onset Date: [id number] Multi CT's with contrast  done at [facility] without pre meds, Heart Cath without pre med documented 2021. CT A/P with contrast without premeds 11/2020 without issues     Adenosine Other (See Comments)    Elevated heart rate when doing stress test    PHYSICAL EXAM:  Performance status (ECOG): 1 - Symptomatic but completely ambulatory  Vitals:   05/02/21 1436  BP: (!) 180/67  Pulse: 73  Resp: 18  Temp: (!) 97 F (36.1 C)  SpO2: 98%   Wt Readings from Last 3 Encounters:  05/02/21 118 lb 9.6 oz (53.8 kg)  01/03/21 110 lb 3.2 oz (50 kg)  12/01/20 110 lb 1.6 oz (49.9 kg)   Physical Exam Vitals reviewed.  Constitutional:      Appearance: Normal appearance.  Cardiovascular:     Rate and Rhythm: Normal rate and regular rhythm.     Pulses: Normal pulses.     Heart sounds: Normal heart sounds.  Pulmonary:     Effort: Pulmonary effort is normal.     Breath sounds: Normal breath sounds.  Abdominal:     Palpations: Abdomen is soft. There is no hepatomegaly, splenomegaly or mass.     Tenderness: There is no abdominal tenderness.     Hernia: No hernia is present.  Musculoskeletal:     Right lower leg: No edema.     Left lower leg: No edema.  Neurological:     General: No focal deficit  present.     Mental Status: She is alert and oriented to person, place, and time.  Psychiatric:        Mood and Affect: Mood normal.        Behavior: Behavior normal.     LABORATORY DATA:  I have reviewed the labs as listed.  CBC Latest Ref Rng & Units 04/25/2021 12/29/2020 11/22/2020  WBC 4.0 - 10.5 K/uL 4.5 4.2 4.7  Hemoglobin 12.0 - 15.0 g/dL 13.9 13.4 7.8(L)  Hematocrit 36.0 - 46.0 % 42.9 45.7 27.5(L)  Platelets 150 - 400 K/uL 166 217 259   CMP Latest Ref Rng & Units 04/25/2021 11/22/2020 10/31/2020  Glucose 70 - 99 mg/dL 115(H) 125(H) 94  BUN 8 - 23 mg/dL _0 Creatinine 0.44 - 1.00 mg/dL 0.72 0.87 0.96  Sodium 135 - 145 mmol/L 136 136 128(L)  Potassium 3.5 - 5.1 mmol/L 4.1 3.8 3.2(L)  Chloride 98 - 111 mmol/L 100  104 92(L)  CO2 22 - 32 mmol/L _1 Calcium 8.9 - 10.3 mg/dL 10.3 9.6 9.3  Total Protein 6.5 - 8.1 g/dL 7.3 6.7 -  Total Bilirubin 0.3 - 1.2 mg/dL 0.9 0.4 -  Alkaline Phos 38 - 126 U/L 86 94 -  AST 15 - 41 U/L 26 20 -  ALT 0 - 44 U/L 29 18 -   Lab Results  Component Value Date   CEA1 1.9 04/25/2021   CEA1 2.5 12/29/2020   CEA1 7.6 (H) 11/22/2020   Lab Results  Component Value Date   TIBC 404 04/25/2021   TIBC 512 (H) 12/29/2020   TIBC 558 (H) 11/22/2020   FERRITIN 114 04/25/2021   FERRITIN 45 12/29/2020   FERRITIN 11 11/22/2020   IRONPCTSAT 20 04/25/2021   IRONPCTSAT 20 12/29/2020   IRONPCTSAT 3 (L) 11/22/2020    DIAGNOSTIC IMAGING:  I have independently reviewed the scans and discussed with the patient. CT Abdomen Pelvis W Contrast  Result Date: 04/25/2021 CLINICAL DATA:  Colorectal cancer, assess treatment response. Right hemicolectomy. EXAM: CT ABDOMEN AND PELVIS WITH CONTRAST TECHNIQUE: Multidetector CT imaging of the abdomen and pelvis was performed using the standard protocol following bolus administration of intravenous contrast. CONTRAST:  71m OMNIPAQUE IOHEXOL 300 MG/ML  SOLN COMPARISON:  11/30/2020. FINDINGS: Lower chest: Image quality in the lung bases is degraded by respiratory motion. No acute findings. Heart is enlarged. No pericardial or pleural effusion. Atherosclerotic calcification of the aorta. Distal esophagus is unremarkable. Hepatobiliary: Liver and gallbladder are unremarkable. No biliary ductal dilatation. Pancreas: Negative. Spleen: Negative. Adrenals/Urinary Tract: Adrenal glands are unremarkable. 1.7 cm fluid density lesion in the right kidney is likely a cyst. Other subcentimeter low-attenuation lesions in the kidneys are too small to characterize. There is a heterogeneous 11 mm lesion with internal calcification in the interpolar left kidney (2/41), as before, also too small to characterize. Kidneys are otherwise unremarkable. Ureters are  decompressed. Small cystocele. Bladder is otherwise grossly unremarkable. Stomach/Bowel: Stomach and small bowel are unremarkable. Right hemicolectomy. Colon is otherwise unremarkable. Vascular/Lymphatic: Atherosclerotic calcification of the aorta with a aorto bi-iliac stent and occlusion of the right common iliac, as before. No pathologically enlarged lymph nodes. Reproductive: Hysterectomy.  No adnexal mass. Other: Surgical clips in both inguinal regions. No free fluid. Mesenteries and peritoneum are unremarkable. Musculoskeletal: Degenerative changes in the spine. No worrisome lytic or sclerotic lesions. IMPRESSION: 1. No evidence of metastatic disease. 2. Heterogeneous partially calcified left renal lesion cannot be characterized as a simple cyst. Further evaluation  with pre and post contrast MRI should be considered. Pre and post contrast CT could alternatively be performed, but would likely be of decreased accuracy given lesion size. These results will be called to the ordering clinician or representative by the Radiologist Assistant, and communication documented in the PACS or [employer]. 3.  Aortic atherosclerosis (ICD10-I70.0). Electronically Signed   By: Lorin Picket M.D.   On: 04/25/2021 14:16     ASSESSMENT:  1.  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 - CT AP with contrast on 08/10/2020 with abnormal appearance of right colon with colocolonic intussusception with wall thickening and abnormally enlarged adjacent right mesenteric lymph nodes. - CT chest with contrast on 09/07/2020 with no evidence of metastatic disease in the chest. -Colonoscopy on 09/15/2020-large spherical, ulcerated, partially necrotic tumor in the transverse/right colon.  Scope could not be maneuvered more proximally to the tumor. - Right hemicolectomy on 10/28/2020 by Dr. Dema Severin. - Pathology shows invasive moderately to poorly differentiated  carcinoma involving the mid ascending colon, spanning 10 cm, tumor invades through muscularis propria into pericolorectal tissue, margins negative.  9/39 lymph nodes involved.  Lymphovascular invasion present.  PT3PN2B. - Loss of MLH1 and PMS2. - BRAF V600 E mutation positive.  MSI-high. -She has refused adjuvant chemotherapy.   2.  Social/family history: - She is accompanied by her daughter today.  Lives by herself and is independent of ADLs and IADLs.  Worked as a Catering manager. - Maternal aunt had ovarian cancer.  No other malignancies.   PLAN:  1.  Stage IIIc (PT3PN2B) adenocarcinoma of ascending colon: - She does not report any change in bowel habits or bleeding per rectum. -Physical exam today was benign. -CEA was 1.9, reduced from 7.6 in January 2022. -RTC 3 months with repeat labs   2.  Microcytic anemia: -Her hemoglobin improved to 13.9 from last visit. -OK to d/c PO iron therapy, iron stores appear replete on today's labs. -Ferritin also improved to 114 from 11 previously. --continue to monitor   Orders placed this encounter:  No orders of the defined types were placed in this encounter.  Ledell Peoples, MD Department of Hematology/Oncology [facility] Phone: [phone] Pager: [phone] Email: [email or web address]

## 2021-05-03 ENCOUNTER — Other Ambulatory Visit (HOSPITAL_COMMUNITY): Payer: Self-pay

## 2021-05-03 DIAGNOSIS — C182 Malignant neoplasm of ascending colon: Secondary | ICD-10-CM

## 2021-05-11 DIAGNOSIS — I10 Essential (primary) hypertension: Secondary | ICD-10-CM | POA: Diagnosis not present

## 2021-05-17 DIAGNOSIS — Z85038 Personal history of other malignant neoplasm of large intestine: Secondary | ICD-10-CM | POA: Diagnosis not present

## 2021-05-17 DIAGNOSIS — Z08 Encounter for follow-up examination after completed treatment for malignant neoplasm: Secondary | ICD-10-CM | POA: Diagnosis not present

## 2021-05-24 ENCOUNTER — Ambulatory Visit (INDEPENDENT_AMBULATORY_CARE_PROVIDER_SITE_OTHER): Payer: Medicare Other | Admitting: Cardiology

## 2021-05-24 ENCOUNTER — Encounter: Payer: Self-pay | Admitting: Cardiology

## 2021-05-24 VITALS — BP 160/70 | HR 63 | Ht 61.5 in | Wt 121.2 lb

## 2021-05-24 DIAGNOSIS — I25119 Atherosclerotic heart disease of native coronary artery with unspecified angina pectoris: Secondary | ICD-10-CM | POA: Diagnosis not present

## 2021-05-24 DIAGNOSIS — C182 Malignant neoplasm of ascending colon: Secondary | ICD-10-CM

## 2021-05-24 MED ORDER — METOPROLOL TARTRATE 50 MG PO TABS: 50.0000 mg | ORAL_TABLET | Freq: Two times a day (BID) | ORAL | 1 refills | Status: DC

## 2021-05-24 NOTE — Patient Instructions (Signed)
Medication Instructions:  Your physician has recommended you make the following change in your medication:  Change metoprolol to 50 mg by mouth twice daily Continue other medications the same  Labwork: none  Testing/Procedures: none  Follow-Up: Your physician recommends that you schedule a follow-up appointment in: 6 months  Any Other Special Instructions Will Be Listed Below (If Applicable).  If you need a refill on your cardiac medications before your next appointment, please call your pharmacy.

## 2021-05-24 NOTE — Progress Notes (Signed)
Cardiology Office Note  Date: 05/24/2021   ID: [name], DOB 05-05-1936, MRN [id number]  PCP:  Glenda Chroman, MD  Cardiologist:  Rozann Lesches, MD Electrophysiologist:  None   Chief Complaint  Patient presents with   Cardiac follow-up    History of Present Illness: [name] is an 85 y.o. female last seen in January.  She is here for a follow-up visit.  Reports no worsening angina symptoms on current therapy.  She tells me that she has to take a nap some afternoons, energy is up and down.  Reports stable appetite, her weight has gone up a few pounds in the last few months.  She continues to follow with oncology, stage IIIc adenocarcinoma of the ascending colon status post right hemicolectomy in December 2021 and having declined adjuvant chemotherapy.  I reviewed her present medications are noted below.  She had been taking her Lopressor 50 mg twice daily with a 25 mg dose in the late afternoon, but feeling somewhat dizzy with this.  We will plan to drop the 25 mg dose and see how she does.  Past Medical History:  Diagnosis Date   Anemia    Anxiety    Arthritis    CAD (coronary artery disease)    Status post CABG April 2021 - Dr. Servando Snare   Chronic constipation    Claudication [facility])    Chronic occlusion of the right limb of aortic stent graft.   Colon cancer ([place])    Diverticulosis    Left sided noted on colonoscopy 02/2006   Ectopic atrial tachycardia (HCC)    Esophageal reflux disease    Essential hypertension    Hiatal hernia    History of TIA (transient ischemic attack)    Hyperlipidemia    Lumbar radiculopathy    Macular degeneration    Migraines    Penetrating atherosclerotic ulcer of aorta (HCC)    Complex ulcerated plaque of the infrarenal abdominal aorta mild renal artery stenosis on the right side, normal left renal artery catheterization 2009, status post aortic stent graft   Renal artery stenosis ([place])    a. Right renal artery stent April 2013  - Dr. Fletcher Anon;  b. 03/2014 PTA/stenting of RRA 2/2 ISR: 6x18 Herculink stent.   Trigeminal neuralgia    Right   Type 2 diabetes mellitus ([place])     Past Surgical History:  Procedure Laterality Date   ABDOMINAL AORTAGRAM N/A 02/13/2012   Procedure: ABDOMINAL Maxcine Ham;  Surgeon: Wellington Hampshire, MD;  Location: [place] CATH LAB;  Service: Cardiovascular;  Laterality: N/A;   ABDOMINAL AORTAGRAM N/A 03/17/2014   Procedure: ABDOMINAL Maxcine Ham;  Surgeon: Wellington Hampshire, MD;  Location: [place] CATH LAB;  Service: Cardiovascular;  Laterality: N/A;   ABDOMINAL HYSTERECTOMY  1982   BIOPSY  09/15/2020   Procedure: BIOPSY;  Surgeon: [name], MD;  [facility];  Service: Endoscopy;;   BREAST BIOPSY Left 1990's   CAROTID ENDARTERECTOMY Left 1993   COLONOSCOPY     COLONOSCOPY WITH PROPOFOL N/A 09/15/2020   Procedure: COLONOSCOPY WITH PROPOFOL;  Surgeon: [name], MD;  [facility];  Service: Endoscopy;  Laterality: N/A;   CORONARY ARTERY BYPASS GRAFT N/A 03/02/2020   Procedure: CORONARY ARTERY BYPASS GRAFTING (CABG)x 3, LIMA TO LAD, SVG TO DIAGONAL, SVG TO RCA, WITH OPEN HARVESTING OF LEFT LOWER GREATER SAPHENOUS VEIN AND RIGHT AND LEFT GREATER SAPHENOUS VEIN HARVESTED ENDOSCOPICALLY;  Surgeon: Grace Isaac, MD;  Location: [employer];  Service:  Open Heart Surgery;  Laterality: N/A;   Endologix powerlink bifurcated     Covered stent graft   ENDOVEIN HARVEST OF GREATER SAPHENOUS VEIN Bilateral 03/02/2020   Procedure: [place]opes Of Greater Saphenous Vein;  Surgeon: Grace Isaac, MD;  Location: [place];  Service: Open Heart Surgery;  Laterality: Bilateral;   LAPAROSCOPIC RIGHT HEMI COLECTOMY Right 10/28/2020   Procedure: LAPAROSCOPIC RIGHT HEMI COLECTOMY,;  Surgeon: Ileana Roup, MD;  [facility];  Service: General;  Laterality: Right;   LEFT HEART CATH AND CORONARY ANGIOGRAPHY N/A 01/28/2020   Procedure: LEFT HEART CATH AND CORONARY ANGIOGRAPHY;  Surgeon: Nelva Bush, MD;  Location: [place] CV LAB;  Service: Cardiovascular;  Laterality: N/A;   LEFT HEART CATHETERIZATION WITH CORONARY ANGIOGRAM N/A 03/17/2014   Procedure: LEFT HEART CATHETERIZATION WITH CORONARY ANGIOGRAM;  Surgeon: Wellington Hampshire, MD;  Location: [place] CATH LAB;  Service: Cardiovascular;  Laterality: N/A;   LUMBAR DISC SURGERY  1981   PERCUTANEOUS STENT INTERVENTION Right 03/17/2014   Procedure: PERCUTANEOUS STENT INTERVENTION;  Surgeon: Wellington Hampshire, MD;  Location: [place] CATH LAB;  Service: Cardiovascular;  Laterality: Right;  Renal   RENAL ANGIOGRAM N/A 02/13/2012   Procedure: RENAL ANGIOGRAM;  Surgeon: Wellington Hampshire, MD;  Location: [place] CATH LAB;  Service: Cardiovascular;  Laterality: N/A;   RENAL ANGIOGRAM N/A 03/17/2014   Procedure: RENAL ANGIOGRAM;  Surgeon: Wellington Hampshire, MD;  Location: [place] CATH LAB;  Service: Cardiovascular;  Laterality: N/A;   SCLEROTHERAPY  09/15/2020   Procedure: SCLEROTHERAPY;  Surgeon: [name], MD;  [facility];  Service: Endoscopy;;   TEE WITHOUT CARDIOVERSION N/A 03/02/2020   Procedure: TRANSESOPHAGEAL ECHOCARDIOGRAM (TEE);  Surgeon: Grace Isaac, MD;  Location: [place];  Service: Open Heart Surgery;  Laterality: N/A;   TONSILLECTOMY AND ADENOIDECTOMY  1947    Current Outpatient Medications  Medication Sig Dispense Refill   acetaminophen (TYLENOL) 500 MG tablet Take 500 mg by mouth every 6 (six) hours as needed for mild pain or moderate pain.      amLODipine (NORVASC) 5 MG tablet Take 1 tablet (5 mg total) by mouth at bedtime. 90 tablet 3   aspirin EC 325 MG EC tablet Take 1 tablet (325 mg total) by mouth daily. 30 tablet 0   Cholecalciferol (VITAMIN D3) 1000 UNITS CAPS Take 1,000 Units by mouth daily.      clonazePAM (KLONOPIN) 0.5 MG tablet Take 0.25 mg by mouth at bedtime.     docusate sodium (COLACE) 100 MG capsule Take 100 mg by mouth in the morning and at bedtime.     isosorbide mononitrate (IMDUR) 30 MG 24 hr tablet  TAKE 1/2 TABLET BY MOUTH EVERY DAY 45 tablet 1   Multiple Vitamins-Minerals (PRESERVISION AREDS 2 PO) Take 1 tablet by mouth in the morning and at bedtime.     nitroGLYCERIN (NITROSTAT) 0.4 MG SL tablet Place 1 tablet (0.4 mg total) under the tongue every 5 (five) minutes as needed for chest pain. MORE THAN 2 DOSES GO TO ED 25 tablet 3   rosuvastatin (CRESTOR) 20 MG tablet TAKE 1 TABLET BY MOUTH AT BEDTIME 90 tablet 3   metoprolol tartrate (LOPRESSOR) 50 MG tablet Take 1 tablet (50 mg total) by mouth 2 (two) times daily. 180 tablet 1   No current facility-administered medications for this visit.   Allergies:  Cefixime, Conjugated estrogens, Morphine and related, Codeine, Iohexol, and Adenosine   ROS: No palpitations or syncope.  Physical Exam: VS:  BP [facility])  160/70   Pulse 63   Ht 5' 1.5" (1.562 m)   Wt 121 lb 3.2 oz (55 kg)   SpO2 96%   BMI 22.53 kg/m , BMI Body mass index is 22.53 kg/m.  Wt Readings from Last 3 Encounters:  05/24/21 121 lb 3.2 oz (55 kg)  05/02/21 118 lb 9.6 oz (53.8 kg)  01/03/21 110 lb 3.2 oz (50 kg)    General: Elderly woman, appears comfortable at rest. HEENT: Conjunctiva and lids normal, wearing a mask. Neck: Supple, no elevated JVP or carotid bruits, no thyromegaly. Lungs: Clear to auscultation, nonlabored breathing at rest. Cardiac: Regular rate and rhythm, no S3 or significant systolic murmur. Extremities: No pitting edema.  ECG:  An ECG dated 10/03/2020 was personally reviewed today and demonstrated:  Sinus rhythm with left atrial enlargement and nonspecific ST-T changes.  Recent Labwork: 11/22/2020: Magnesium 2.2 04/25/2021: ALT 29; AST 26; BUN 23; Creatinine, Ser 0.72; Hemoglobin 13.9; Platelets 166; Potassium 4.1; Sodium 136     Component Value Date/Time   CHOL 130 01/29/2020 0202   TRIG 105 01/29/2020 0202   HDL 38 (L) 01/29/2020 0202   CHOLHDL 3.4 01/29/2020 0202   VLDL 21 01/29/2020 0202   LDLCALC 71 01/29/2020 0202    Other Studies  Reviewed Today:  Echocardiogram 03/01/2020:  1. Left ventricular ejection fraction, by estimation, is 55 to 60%. The  left ventricle has normal function. The left ventricle has no regional  wall motion abnormalities. There is mild left ventricular hypertrophy of  the basal-septal segment. Left  ventricular diastolic parameters are indeterminate.   2. Right ventricular systolic function is normal. The right ventricular  size is normal. There is normal pulmonary artery systolic pressure.   3. The mitral valve is normal in structure. Mild mitral valve  regurgitation. No evidence of mitral stenosis.   4. The aortic valve is tricuspid. Aortic valve regurgitation is not  visualized. Mild to moderate aortic valve sclerosis/calcification is  present, without any evidence of aortic stenosis.   5. The inferior vena cava is normal in size with greater than 50%  respiratory variability, suggesting right atrial pressure of 3 mmHg.   Lexiscan Myoview 09/09/2020: There was no ST segment deviation noted during stress. Findings consistent with prior small inferior myocardial infarction. This is a low risk study. There is no current ischemia. The left ventricular ejection fraction is hyperdynamic (>65%).  Assessment and Plan:  1.  Multivessel CAD status post CABG in April 2021.  She does not report any accelerating angina on medical therapy.  Myoview from last year was overall low risk.  Change Lopressor to 50 mg twice daily, otherwise continue aspirin, Norvasc, Imdur, and Crestor.  2.  Stage IIIc adenocarcinoma of the ascending colon status post right hemicolectomy in December 2021.  She has declined adjuvant chemotherapy.  Continues to follow with oncology, GI, and general surgery.  Medication Adjustments/Labs and Tests Ordered: Current medicines are reviewed at length with the patient today.  Concerns regarding medicines are outlined above.   Tests Ordered: No orders of the defined types were  placed in this encounter.   Medication Changes: Meds ordered this encounter  Medications   metoprolol tartrate (LOPRESSOR) 50 MG tablet    Sig: Take 1 tablet (50 mg total) by mouth 2 (two) times daily.    Dispense:  180 tablet    Refill:  1    05/24/2021 change in directions     Disposition:  Follow up  6 months.  Signed, Aloha Gell  Domenic Polite, MD, [facility] 05/24/2021 1:29 PM    [facility] at [place], [place], [place] [address] Phone: [phone]; Fax: [phone]

## 2021-06-01 DIAGNOSIS — Z1331 Encounter for screening for depression: Secondary | ICD-10-CM | POA: Diagnosis not present

## 2021-06-01 DIAGNOSIS — Z6821 Body mass index (BMI) 21.0-21.9, adult: Secondary | ICD-10-CM | POA: Diagnosis not present

## 2021-06-01 DIAGNOSIS — Z299 Encounter for prophylactic measures, unspecified: Secondary | ICD-10-CM | POA: Diagnosis not present

## 2021-06-01 DIAGNOSIS — Z79899 Other long term (current) drug therapy: Secondary | ICD-10-CM | POA: Diagnosis not present

## 2021-06-01 DIAGNOSIS — Z7189 Other specified counseling: Secondary | ICD-10-CM | POA: Diagnosis not present

## 2021-06-01 DIAGNOSIS — Z789 Other specified health status: Secondary | ICD-10-CM | POA: Diagnosis not present

## 2021-06-01 DIAGNOSIS — Z0000 Encounter for general adult medical examination without abnormal findings: Secondary | ICD-10-CM | POA: Diagnosis not present

## 2021-06-01 DIAGNOSIS — R5383 Other fatigue: Secondary | ICD-10-CM | POA: Diagnosis not present

## 2021-06-01 DIAGNOSIS — I10 Essential (primary) hypertension: Secondary | ICD-10-CM | POA: Diagnosis not present

## 2021-06-01 DIAGNOSIS — E7800 Pure hypercholesterolemia, unspecified: Secondary | ICD-10-CM | POA: Diagnosis not present

## 2021-06-01 DIAGNOSIS — Z1339 Encounter for screening examination for other mental health and behavioral disorders: Secondary | ICD-10-CM | POA: Diagnosis not present

## 2021-06-02 DIAGNOSIS — Z79899 Other long term (current) drug therapy: Secondary | ICD-10-CM | POA: Diagnosis not present

## 2021-06-02 DIAGNOSIS — E7800 Pure hypercholesterolemia, unspecified: Secondary | ICD-10-CM | POA: Diagnosis not present

## 2021-06-02 DIAGNOSIS — R5383 Other fatigue: Secondary | ICD-10-CM | POA: Diagnosis not present

## 2021-06-15 DIAGNOSIS — I10 Essential (primary) hypertension: Secondary | ICD-10-CM | POA: Diagnosis not present

## 2021-06-15 DIAGNOSIS — Z299 Encounter for prophylactic measures, unspecified: Secondary | ICD-10-CM | POA: Diagnosis not present

## 2021-06-15 DIAGNOSIS — D492 Neoplasm of unspecified behavior of bone, soft tissue, and skin: Secondary | ICD-10-CM | POA: Diagnosis not present

## 2021-06-26 ENCOUNTER — Telehealth: Payer: Self-pay | Admitting: Cardiology

## 2021-06-26 MED ORDER — CLONIDINE HCL 0.1 MG PO TABS: 0.1000 mg | ORAL_TABLET | Freq: Every day | ORAL | 1 refills | Status: DC

## 2021-06-26 NOTE — Telephone Encounter (Signed)
Spoke to pt who states that she has been having an issue with bp trending upward.  Sunday- 175/86 Saturday- 200/90 Monday- 196/91 Pt stated confirmed that she is taking Metoprolol Tartrate 50 mg tablets BID. Pt complains that since she has been taking the Lopressor, she has been suffering from a headache and dizzy spells with a flushed feeling.  Please advise.

## 2021-06-26 NOTE — Telephone Encounter (Signed)
New message   Pt c/o BP issue: STAT if pt c/o blurred vision, one-sided weakness or slurred speech  1. What are your last 5 BP readings today 196/91 hr 70  Saturday 200/?  Last night 175/86 hr 66  2. Are you having any other symptoms (ex. Dizziness, headache, blurred vision, passed out) dizziness and bad headache   3. What is your BP issue? Bp is high

## 2021-07-05 DIAGNOSIS — R519 Headache, unspecified: Secondary | ICD-10-CM | POA: Diagnosis not present

## 2021-07-05 DIAGNOSIS — I10 Essential (primary) hypertension: Secondary | ICD-10-CM | POA: Diagnosis not present

## 2021-07-05 DIAGNOSIS — Z789 Other specified health status: Secondary | ICD-10-CM | POA: Diagnosis not present

## 2021-07-05 DIAGNOSIS — Z299 Encounter for prophylactic measures, unspecified: Secondary | ICD-10-CM | POA: Diagnosis not present

## 2021-07-05 DIAGNOSIS — E1165 Type 2 diabetes mellitus with hyperglycemia: Secondary | ICD-10-CM | POA: Diagnosis not present

## 2021-07-05 DIAGNOSIS — J309 Allergic rhinitis, unspecified: Secondary | ICD-10-CM | POA: Diagnosis not present

## 2021-07-12 DIAGNOSIS — I10 Essential (primary) hypertension: Secondary | ICD-10-CM | POA: Diagnosis not present

## 2021-07-14 DIAGNOSIS — I10 Essential (primary) hypertension: Secondary | ICD-10-CM | POA: Diagnosis not present

## 2021-07-14 DIAGNOSIS — H6122 Impacted cerumen, left ear: Secondary | ICD-10-CM | POA: Diagnosis not present

## 2021-07-14 DIAGNOSIS — Z299 Encounter for prophylactic measures, unspecified: Secondary | ICD-10-CM | POA: Diagnosis not present

## 2021-07-18 DIAGNOSIS — I700 Atherosclerosis of aorta: Secondary | ICD-10-CM | POA: Diagnosis not present

## 2021-07-18 DIAGNOSIS — Z299 Encounter for prophylactic measures, unspecified: Secondary | ICD-10-CM | POA: Diagnosis not present

## 2021-07-18 DIAGNOSIS — E1129 Type 2 diabetes mellitus with other diabetic kidney complication: Secondary | ICD-10-CM | POA: Diagnosis not present

## 2021-07-18 DIAGNOSIS — I10 Essential (primary) hypertension: Secondary | ICD-10-CM | POA: Diagnosis not present

## 2021-07-18 DIAGNOSIS — R809 Proteinuria, unspecified: Secondary | ICD-10-CM | POA: Diagnosis not present

## 2021-07-19 ENCOUNTER — Ambulatory Visit (INDEPENDENT_AMBULATORY_CARE_PROVIDER_SITE_OTHER): Payer: Medicare Other | Admitting: Cardiology

## 2021-07-19 ENCOUNTER — Encounter: Payer: Self-pay | Admitting: Cardiology

## 2021-07-19 VITALS — BP 176/80 | HR 68 | Ht 60.0 in | Wt 121.4 lb

## 2021-07-19 DIAGNOSIS — I10 Essential (primary) hypertension: Secondary | ICD-10-CM

## 2021-07-19 DIAGNOSIS — R519 Headache, unspecified: Secondary | ICD-10-CM | POA: Diagnosis not present

## 2021-07-19 DIAGNOSIS — I25119 Atherosclerotic heart disease of native coronary artery with unspecified angina pectoris: Secondary | ICD-10-CM

## 2021-07-19 NOTE — Patient Instructions (Addendum)
Medication Instructions:  Your physician recommends that you continue on your current medications as directed. Please refer to the Current Medication list given to you today.  Labwork: none  Testing/Procedures: none  Follow-Up: Your physician recommends that you schedule a follow-up appointment in: 3 months  Any Other Special Instructions Will Be Listed Below (If Applicable). You have been referred to neurology.  If you need a refill on your cardiac medications before your next appointment, please call your pharmacy.

## 2021-07-19 NOTE — Progress Notes (Signed)
Cardiology Office Note  Date: 07/19/2021   ID: [name], [name] 1935/11/23, MRN [id number]  PCP:  [name] Chroman, MD  Cardiologist:  Rozann Lesches, MD Electrophysiologist:  None   Chief Complaint  Patient presents with   Cardiac follow-up     History of Present Illness: [name] is an 85 y.o. female last seen in July.  She presents for a follow-up visit, mainly with concerns about recurring headaches.  She states that this has been a problem for years, but has been getting worse.  Also having some trouble with dizziness and inner ear problems, states that she had earwax cleaned out by her PCP as well.  She was told years ago that she had "cluster headaches."  We went over her medications.  She has been on metoprolol and Norvasc for treatment of hypertension, no longer using clonidine which she had used as needed in the past when systolics were over [id number].  Beta-blocker prior to heart surgery was labetalol.  She has had trouble with medication intolerances over the years.  I reviewed her home blood pressure checks which actually look better than the recorded pressure today.  Heart rate generally in the 60s as well.  Of note, she did try coming off Imdur and this had no impact on her headaches.  Past Medical History:  Diagnosis Date   Anemia    Anxiety    Arthritis    CAD (coronary artery disease)    Status post CABG April 2021 - Dr. Servando Snare   Chronic constipation    Claudication [facility])    Chronic occlusion of the right limb of aortic stent graft.   Colon cancer ([place])    Diverticulosis    Left sided noted on colonoscopy 02/2006   Ectopic atrial tachycardia (HCC)    Esophageal reflux disease    Essential hypertension    Hiatal hernia    History of TIA (transient ischemic attack)    Hyperlipidemia    Lumbar radiculopathy    Macular degeneration    Migraines    Penetrating atherosclerotic ulcer of aorta (HCC)    Complex ulcerated plaque of the infrarenal abdominal  aorta mild renal artery stenosis on the right side, normal left renal artery catheterization 2009, status post aortic stent graft   Renal artery stenosis ([place])    a. Right renal artery stent April 2013 - Dr. Fletcher Anon;  b. 03/2014 PTA/stenting of RRA 2/2 ISR: 6x18 Herculink stent.   Trigeminal neuralgia    Right   Type 2 diabetes mellitus ([place])     Past Surgical History:  Procedure Laterality Date   ABDOMINAL AORTAGRAM N/A 02/13/2012   Procedure: ABDOMINAL Maxcine Ham;  Surgeon: Wellington Hampshire, MD;  Location: [place] CATH LAB;  Service: Cardiovascular;  Laterality: N/A;   ABDOMINAL AORTAGRAM N/A 03/17/2014   Procedure: ABDOMINAL Maxcine Ham;  Surgeon: Wellington Hampshire, MD;  Location: [place] CATH LAB;  Service: Cardiovascular;  Laterality: N/A;   ABDOMINAL HYSTERECTOMY  1982   BIOPSY  09/15/2020   Procedure: BIOPSY;  Surgeon: [name], MD;  [facility];  Service: Endoscopy;;   BREAST BIOPSY Left 1990's   CAROTID ENDARTERECTOMY Left 1993   COLONOSCOPY     COLONOSCOPY WITH PROPOFOL N/A 09/15/2020   Procedure: COLONOSCOPY WITH PROPOFOL;  Surgeon: [name], MD;  [facility];  Service: Endoscopy;  Laterality: N/A;   CORONARY ARTERY BYPASS GRAFT N/A 03/02/2020   Procedure: CORONARY ARTERY BYPASS GRAFTING (CABG)x 3, LIMA TO LAD, SVG TO  DIAGONAL, SVG TO RCA, WITH OPEN HARVESTING OF LEFT LOWER GREATER SAPHENOUS VEIN AND RIGHT AND LEFT GREATER SAPHENOUS VEIN HARVESTED ENDOSCOPICALLY;  Surgeon: Grace Isaac, MD;  Location: [place];  Service: Open Heart Surgery;  Laterality: N/A;   Endologix powerlink bifurcated     Covered stent graft   ENDOVEIN HARVEST OF GREATER SAPHENOUS VEIN Bilateral 03/02/2020   Procedure: [place] Ropes Of Greater Saphenous Vein;  Surgeon: Grace Isaac, MD;  Location: [place];  Service: Open Heart Surgery;  Laterality: Bilateral;   LAPAROSCOPIC RIGHT HEMI COLECTOMY Right 10/28/2020   Procedure: LAPAROSCOPIC RIGHT HEMI COLECTOMY,;  Surgeon: Ileana Roup, MD;  [facility];  Service: General;  Laterality: Right;   LEFT HEART CATH AND CORONARY ANGIOGRAPHY N/A 01/28/2020   Procedure: LEFT HEART CATH AND CORONARY ANGIOGRAPHY;  Surgeon: Nelva Bush, MD;  Location: [place] CV LAB;  Service: Cardiovascular;  Laterality: N/A;   LEFT HEART CATHETERIZATION WITH CORONARY ANGIOGRAM N/A 03/17/2014   Procedure: LEFT HEART CATHETERIZATION WITH CORONARY ANGIOGRAM;  Surgeon: Wellington Hampshire, MD;  Location: [place] CATH LAB;  Service: Cardiovascular;  Laterality: N/A;   LUMBAR DISC SURGERY  1981   PERCUTANEOUS STENT INTERVENTION Right 03/17/2014   Procedure: PERCUTANEOUS STENT INTERVENTION;  Surgeon: Wellington Hampshire, MD;  Location: [place] CATH LAB;  Service: Cardiovascular;  Laterality: Right;  Renal   RENAL ANGIOGRAM N/A 02/13/2012   Procedure: RENAL ANGIOGRAM;  Surgeon: Wellington Hampshire, MD;  Location: [place] CATH LAB;  Service: Cardiovascular;  Laterality: N/A;   RENAL ANGIOGRAM N/A 03/17/2014   Procedure: RENAL ANGIOGRAM;  Surgeon: Wellington Hampshire, MD;  Location: [place] CATH LAB;  Service: Cardiovascular;  Laterality: N/A;   SCLEROTHERAPY  09/15/2020   Procedure: SCLEROTHERAPY;  Surgeon: [name], MD;  [facility];  Service: Endoscopy;;   TEE WITHOUT CARDIOVERSION N/A 03/02/2020   Procedure: TRANSESOPHAGEAL ECHOCARDIOGRAM (TEE);  Surgeon: Grace Isaac, MD;  Location: [place];  Service: Open Heart Surgery;  Laterality: N/A;   TONSILLECTOMY AND ADENOIDECTOMY  1947    Current Outpatient Medications  Medication Sig Dispense Refill   acetaminophen (TYLENOL) 500 MG tablet Take 500 mg by mouth every 6 (six) hours as needed for mild pain or moderate pain.      amLODipine (NORVASC) 5 MG tablet Take 1 tablet (5 mg total) by mouth at bedtime. 90 tablet 3   aspirin EC 325 MG EC tablet Take 1 tablet (325 mg total) by mouth daily. (Patient taking differently: Take 81 mg by mouth daily.) 30 tablet 0   Cholecalciferol (VITAMIN D3) 1000 UNITS CAPS  Take 1,000 Units by mouth daily.      clonazePAM (KLONOPIN) 0.5 MG tablet Take 0.25 mg by mouth at bedtime.     docusate sodium (COLACE) 100 MG capsule Take 100 mg by mouth in the morning and at bedtime.     isosorbide mononitrate (IMDUR) 30 MG 24 hr tablet TAKE 1/2 TABLET BY MOUTH EVERY DAY 45 tablet 1   metoprolol tartrate (LOPRESSOR) 50 MG tablet Take 1 tablet (50 mg total) by mouth 2 (two) times daily. 180 tablet 1   Multiple Vitamins-Minerals (PRESERVISION AREDS 2 PO) Take 1 tablet by mouth in the morning and at bedtime.     nitroGLYCERIN (NITROSTAT) 0.4 MG SL tablet Place 0.4 mg under the tongue every 5 (five) minutes as needed for chest pain.     rosuvastatin (CRESTOR) 20 MG tablet TAKE 1 TABLET BY MOUTH AT BEDTIME 90 tablet 3   No current  facility-administered medications for this visit.   Allergies:  Cefixime, Conjugated estrogens, Morphine and related, Codeine, Iohexol, and Adenosine   ROS: Intermittent palpitations.  Physical Exam: VS:  BP (!) 176/80   Pulse 68   Ht 5' (1.524 m)   Wt 121 lb 6.4 oz (55.1 kg)   SpO2 96%   BMI 23.71 kg/m , BMI Body mass index is 23.71 kg/m.  Wt Readings from Last 3 Encounters:  07/19/21 121 lb 6.4 oz (55.1 kg)  05/24/21 121 lb 3.2 oz (55 kg)  05/02/21 118 lb 9.6 oz (53.8 kg)    General: Patient appears comfortable at rest. HEENT: Conjunctiva and lids normal, wearing a mask. Neck: Supple, no elevated JVP or carotid bruits, no thyromegaly. Lungs: Clear to auscultation, nonlabored breathing at rest. Cardiac: Regular rate and rhythm, no S3, 1/6 systolic murmur. Extremities: No pitting edema.  ECG:  An ECG dated 10/03/2020 was personally reviewed today and demonstrated:  Sinus left atrial enlargement and nonspecific ST-T changes.  Recent Labwork: 11/22/2020: Magnesium 2.2 04/25/2021: ALT 29; AST 26; BUN 23; Creatinine, Ser 0.72; Hemoglobin 13.9; Platelets 166; Potassium 4.1; Sodium 136     Component Value Date/Time   CHOL 130 01/29/2020  0202   TRIG 105 01/29/2020 0202   HDL 38 (L) 01/29/2020 0202   CHOLHDL 3.4 01/29/2020 0202   VLDL 21 01/29/2020 0202   LDLCALC 71 01/29/2020 0202    Other Studies Reviewed Today:  Echocardiogram 03/01/2020:  1. Left ventricular ejection fraction, by estimation, is 55 to 60%. The  left ventricle has normal function. The left ventricle has no regional  wall motion abnormalities. There is mild left ventricular hypertrophy of  the basal-septal segment. Left  ventricular diastolic parameters are indeterminate.   2. Right ventricular systolic function is normal. The right ventricular  size is normal. There is normal pulmonary artery systolic pressure.   3. The mitral valve is normal in structure. Mild mitral valve  regurgitation. No evidence of mitral stenosis.   4. The aortic valve is tricuspid. Aortic valve regurgitation is not  visualized. Mild to moderate aortic valve sclerosis/calcification is  present, without any evidence of aortic stenosis.   5. The inferior vena cava is normal in size with greater than 50%  respiratory variability, suggesting right atrial pressure of 3 mmHg.   Lexiscan Myoview 09/09/2020: There was no ST segment deviation noted during stress. Findings consistent with prior small inferior myocardial infarction. This is a low risk study. There is no current ischemia. The left ventricular ejection fraction is hyperdynamic (>65%).  Assessment and Plan:  1.  Recurrent headaches, not necessarily related to current medications, she has been on the present antihypertensive regimen since cardiac surgery and has been having headaches prior to that.  Was told in the past that she has "cluster headaches."  Referral being made to neurology for headache specialist evaluation.  Her PCP is Dr. Woody Seller.  2.  Multivessel CAD status post CABG in April 2021.  She does not report any active angina at this time.  Continue aspirin, Crestor, Norvasc, Imdur, and Lopressor.  She has not had  to use any recent nitroglycerin.  Of note, stopping Imdur did not positively affect headaches.  3.  Essential hypertension, systolic [id number] today, but I reviewed several recent home blood pressure checks which look much better.  Would continue with current regimen.  I am certainly not opposed to making adjustments over time, she has had intolerances over the years.  4.  Stage IIIc adenocarcinoma of the  ascending colon status post right hemicolectomy December 2021.  She has declined adjuvant chemotherapy and continues to follow with oncology.  Medication Adjustments/Labs and Tests Ordered: Current medicines are reviewed at length with the patient today.  Concerns regarding medicines are outlined above.   Tests Ordered: Orders Placed This Encounter  Procedures   Ambulatory referral to Neurology     Medication Changes: No orders of the defined types were placed in this encounter.   Disposition:  Follow up  3 months.  Signed, [name], MD, [facility] 07/19/2021 1:38 PM    [facility] at [place], [place], [place] [address] Phone: [phone]; Fax: [phone]

## 2021-07-21 ENCOUNTER — Other Ambulatory Visit: Payer: Self-pay | Admitting: Cardiology

## 2021-07-24 ENCOUNTER — Telehealth: Payer: Self-pay

## 2021-07-24 ENCOUNTER — Ambulatory Visit: Payer: Medicare Other | Admitting: Neurology

## 2021-07-24 NOTE — Telephone Encounter (Signed)
We received a new referral for a patient of Dr Tori Milks, last seen in 2017 for recurrent headaches. She is being referred back for recurrent headaches and is requesting a provider switch to Dr Jaynee Eagles.   Can you please advise if this is acceptable?  Thank you!

## 2021-07-26 NOTE — Telephone Encounter (Signed)
Okay with me 

## 2021-07-28 DIAGNOSIS — R519 Headache, unspecified: Secondary | ICD-10-CM | POA: Diagnosis not present

## 2021-07-28 DIAGNOSIS — I10 Essential (primary) hypertension: Secondary | ICD-10-CM | POA: Diagnosis not present

## 2021-07-28 DIAGNOSIS — K219 Gastro-esophageal reflux disease without esophagitis: Secondary | ICD-10-CM | POA: Diagnosis not present

## 2021-07-28 DIAGNOSIS — Z299 Encounter for prophylactic measures, unspecified: Secondary | ICD-10-CM | POA: Diagnosis not present

## 2021-07-28 DIAGNOSIS — H9202 Otalgia, left ear: Secondary | ICD-10-CM | POA: Diagnosis not present

## 2021-08-02 ENCOUNTER — Other Ambulatory Visit: Payer: Self-pay

## 2021-08-02 ENCOUNTER — Inpatient Hospital Stay (HOSPITAL_COMMUNITY): Payer: Medicare Other | Attending: Hematology

## 2021-08-02 DIAGNOSIS — R5383 Other fatigue: Secondary | ICD-10-CM | POA: Insufficient documentation

## 2021-08-02 DIAGNOSIS — Z87891 Personal history of nicotine dependence: Secondary | ICD-10-CM | POA: Diagnosis not present

## 2021-08-02 DIAGNOSIS — Z8249 Family history of ischemic heart disease and other diseases of the circulatory system: Secondary | ICD-10-CM | POA: Diagnosis not present

## 2021-08-02 DIAGNOSIS — Z8041 Family history of malignant neoplasm of ovary: Secondary | ICD-10-CM | POA: Insufficient documentation

## 2021-08-02 DIAGNOSIS — D509 Iron deficiency anemia, unspecified: Secondary | ICD-10-CM | POA: Insufficient documentation

## 2021-08-02 DIAGNOSIS — Z79899 Other long term (current) drug therapy: Secondary | ICD-10-CM | POA: Insufficient documentation

## 2021-08-02 DIAGNOSIS — C182 Malignant neoplasm of ascending colon: Secondary | ICD-10-CM | POA: Insufficient documentation

## 2021-08-02 DIAGNOSIS — R450 Nervousness: Secondary | ICD-10-CM | POA: Diagnosis not present

## 2021-08-02 DIAGNOSIS — Z885 Allergy status to narcotic agent status: Secondary | ICD-10-CM | POA: Insufficient documentation

## 2021-08-02 DIAGNOSIS — Z823 Family history of stroke: Secondary | ICD-10-CM | POA: Diagnosis not present

## 2021-08-02 DIAGNOSIS — Z8673 Personal history of transient ischemic attack (TIA), and cerebral infarction without residual deficits: Secondary | ICD-10-CM | POA: Insufficient documentation

## 2021-08-02 DIAGNOSIS — Z841 Family history of disorders of kidney and ureter: Secondary | ICD-10-CM | POA: Insufficient documentation

## 2021-08-02 DIAGNOSIS — Z818 Family history of other mental and behavioral disorders: Secondary | ICD-10-CM | POA: Insufficient documentation

## 2021-08-02 DIAGNOSIS — R519 Headache, unspecified: Secondary | ICD-10-CM | POA: Insufficient documentation

## 2021-08-02 DIAGNOSIS — Z881 Allergy status to other antibiotic agents status: Secondary | ICD-10-CM | POA: Diagnosis not present

## 2021-08-02 DIAGNOSIS — R42 Dizziness and giddiness: Secondary | ICD-10-CM | POA: Diagnosis not present

## 2021-08-02 DIAGNOSIS — Z9049 Acquired absence of other specified parts of digestive tract: Secondary | ICD-10-CM | POA: Insufficient documentation

## 2021-08-02 DIAGNOSIS — R350 Frequency of micturition: Secondary | ICD-10-CM | POA: Insufficient documentation

## 2021-08-02 DIAGNOSIS — R002 Palpitations: Secondary | ICD-10-CM | POA: Diagnosis not present

## 2021-08-02 DIAGNOSIS — R0602 Shortness of breath: Secondary | ICD-10-CM | POA: Diagnosis not present

## 2021-08-02 DIAGNOSIS — Z888 Allergy status to other drugs, medicaments and biological substances status: Secondary | ICD-10-CM | POA: Diagnosis not present

## 2021-08-02 LAB — COMPREHENSIVE METABOLIC PANEL
ALT: 27 U/L (ref 0–44)
AST: 26 U/L (ref 15–41)
Albumin: 4.5 g/dL (ref 3.5–5.0)
Alkaline Phosphatase: 86 U/L (ref 38–126)
Anion gap: 8 (ref 5–15)
BUN: 18 mg/dL (ref 8–23)
CO2: 28 mmol/L (ref 22–32)
Calcium: 10.2 mg/dL (ref 8.9–10.3)
Chloride: 100 mmol/L (ref 98–111)
Creatinine, Ser: 1 mg/dL (ref 0.44–1.00)
GFR, Estimated: 56 mL/min — ABNORMAL LOW (ref >60)
Glucose, Bld: 141 mg/dL — ABNORMAL HIGH (ref 70–99)
Potassium: 4.2 mmol/L (ref 3.5–5.1)
Sodium: 136 mmol/L (ref 135–145)
Total Bilirubin: 0.6 mg/dL (ref 0.3–1.2)
Total Protein: 7.1 g/dL (ref 6.5–8.1)

## 2021-08-02 LAB — CBC WITH DIFFERENTIAL/PLATELET
Abs Immature Granulocytes: 0.01 10*3/uL (ref 0.00–0.07)
Basophils Absolute: 0 10*3/uL (ref 0.0–0.1)
Basophils Relative: 0 %
Eosinophils Absolute: 0.1 10*3/uL (ref 0.0–0.5)
Eosinophils Relative: 3 %
HCT: 42.3 % (ref 36.0–46.0)
Hemoglobin: 13.9 g/dL (ref 12.0–15.0)
Immature Granulocytes: 0 %
Lymphocytes Relative: 28 %
Lymphs Abs: 1 10*3/uL (ref 0.7–4.0)
MCH: 29.1 pg (ref 26.0–34.0)
MCHC: 32.9 g/dL (ref 30.0–36.0)
MCV: 88.5 fL (ref 80.0–100.0)
Monocytes Absolute: 0.2 10*3/uL (ref 0.1–1.0)
Monocytes Relative: 7 %
Neutro Abs: 2.2 10*3/uL (ref 1.7–7.7)
Neutrophils Relative %: 62 %
Platelets: 171 10*3/uL (ref 150–400)
RBC: 4.78 MIL/uL (ref 3.87–5.11)
RDW: 13.7 % (ref 11.5–15.5)
WBC: 3.5 10*3/uL — ABNORMAL LOW (ref 4.0–10.5)
nRBC: 0 % (ref 0.0–0.2)

## 2021-08-02 LAB — IRON AND TIBC
Iron: 99 ug/dL (ref 28–170)
Saturation Ratios: 25 % (ref 10.4–31.8)
TIBC: 400 ug/dL (ref 250–450)
UIBC: 301 ug/dL

## 2021-08-02 LAB — FERRITIN: Ferritin: 125 ng/mL (ref 11–307)

## 2021-08-03 LAB — CEA: CEA: 1.8 ng/mL (ref 0.0–4.7)

## 2021-08-07 DIAGNOSIS — Z20828 Contact with and (suspected) exposure to other viral communicable diseases: Secondary | ICD-10-CM | POA: Diagnosis not present

## 2021-08-08 NOTE — Progress Notes (Signed)
[name], [place] [address]   CLINIC:  Medical Oncology/Hematology  PCP:  Glenda Chroman, MD [address] / [place] [address] [phone]   REASON FOR VISIT:  Follow-up for right colon cancer  PRIOR THERAPY: Right hemicolectomy on 10/28/2020  NGS Results: Loss of MLH1 and PMS2, BRAF V 600 E mutation positive, MSI--high  CURRENT THERAPY:  Iron tablet BID  BRIEF ONCOLOGIC HISTORY:  Oncology History  Cancer of ascending colon ([place])  01/15/2021 Initial Diagnosis   Cancer of ascending colon ([place])   01/15/2021 Cancer Staging   Staging form: Colon and Rectum, AJCC 8th Edition - Clinical stage from 01/15/2021: Stage IIIC (cT3, cN2b, cM0) - Signed by Derek Jack, MD on 01/15/2021 Stage prefix: Initial diagnosis Microsatellite instability (MSI): Unstable high     CANCER STAGING: Cancer Staging Cancer of ascending colon [facility]) Staging form: Colon and Rectum, AJCC 8th Edition - Clinical stage from 01/15/2021: Stage IIIC (cT3, cN2b, cM0) - Signed by Derek Jack, MD on 01/15/2021   INTERVAL HISTORY:  [name], a 85 y.o. female, returns for routine follow-up of her right colon cancer. [name] was last seen on 01/03/2021.   Today she reports feeling well.   REVIEW OF SYSTEMS:  Review of Systems  Constitutional:  Positive for fatigue (40%). Negative for appetite change.  Respiratory:  Positive for shortness of breath.   Cardiovascular:  Positive for palpitations.  Genitourinary:  Positive for frequency.   Neurological:  Positive for dizziness and headaches.  Psychiatric/Behavioral:  The patient is nervous/anxious.   All other systems reviewed and are negative.  PAST MEDICAL/SURGICAL HISTORY:  Past Medical History:  Diagnosis Date   Anemia    Anxiety    Arthritis    CAD (coronary artery disease)    Status post CABG April 2021 - Dr. Servando Snare   Chronic constipation    Claudication [facility])    Chronic occlusion of the  right limb of aortic stent graft.   Colon cancer ([place])    Diverticulosis    Left sided noted on colonoscopy 02/2006   Ectopic atrial tachycardia (H[facility])    Esophageal reflux disease    Essential hypertension    Hiatal hernia    History of TIA (transient ischemic attack)    Hyperlipidemia    Lumbar radiculopathy    Macular degeneration    Migraines    Penetrating atherosclerotic ulcer of aorta (H[facility])    Complex ulcerated plaque of the infrarenal abdominal aorta mild renal artery stenosis on the right side, normal left renal artery catheterization 2009, status post aortic stent graft   Renal artery stenosis ([place])    a. Right renal artery stent April 2013 - Dr. Fletcher Anon;  b. 03/2014 PTA/stenting of RRA 2/2 ISR: 6x18 Herculink stent.   Trigeminal neuralgia    Right   Type 2 diabetes mellitus ([place])    Past Surgical History:  Procedure Laterality Date   ABDOMINAL AORTAGRAM N/A 02/13/2012   Procedure: ABDOMINAL Maxcine Ham;  Surgeon: Wellington Hampshire, MD;  Location: [place] CATH LAB;  Service: Cardiovascular;  Laterality: N/A;   ABDOMINAL AORTAGRAM N/A 03/17/2014   Procedure: ABDOMINAL Maxcine Ham;  Surgeon: Wellington Hampshire, MD;  Location: [place] CATH LAB;  Service: Cardiovascular;  Laterality: N/A;   ABDOMINAL HYSTERECTOMY  1982   BIOPSY  09/15/2020   Procedure: BIOPSY;  Surgeon: [name], MD;  [facility]: [facility] ENDOS[facility];  Service: Endoscopy;;   BREAST BIOPSY Left 1990's   CAROTID ENDARTERECTOMY Left 1993  COLONOSCOPY     COLONOSCOPY WITH PROPOFOL N/A 09/15/2020   Procedure: COLONOSCOPY WITH PROPOFOL;  Surgeon: [name], MD;  [facility]OS[facility];  Service: Endoscopy;  Laterality: N/A;   CORONARY ARTERY BYPASS GRAFT N/A 03/02/2020   Procedure: CORONARY ARTERY BYPASS GRAFTING (CABG)x 3, LIMA TO LAD, SVG TO DIAGONAL, SVG TO RCA, WITH OPEN HARVESTING OF LEFT LOWER GREATER SAPHENOUS VEIN AND RIGHT AND LEFT GREATER SAPHENOUS VEIN HARVESTED ENDOSCOPICALLY;  Surgeon: Grace Isaac, MD;  Location: [place] [facility];  Service: Open Heart Surgery;  Laterality: N/A;   Endologix powerlink bifurcated     Covered stent graft   ENDOVEIN HARVEST OF GREATER SAPHENOUS VEIN Bilateral 03/02/2020   Procedure: [place] Of Greater Saphenous Vein;  Surgeon: Grace Isaac, MD;  Location: [facility];  Service: Open Heart Surgery;  Laterality: Bilateral;   LAPAROSCOPIC RIGHT HEMI COLECTOMY Right 10/28/2020   Procedure: LAPAROSCOPIC RIGHT HEMI COLECTOMY,;  Surgeon: Ileana Roup, MD;  [facility];  Service: General;  Laterality: Right;   LEFT HEART CATH AND CORONARY ANGIOGRAPHY N/A 01/28/2020   Procedure: LEFT HEART CATH AND CORONARY ANGIOGRAPHY;  Surgeon: Nelva Bush, MD;  Location: [place] CV LAB;  Service: Cardiovascular;  Laterality: N/A;   LEFT HEART CATHETERIZATION WITH CORONARY ANGIOGRAM N/A 03/17/2014   Procedure: LEFT HEART CATHETERIZATION WITH CORONARY ANGIOGRAM;  Surgeon: Wellington Hampshire, MD;  Location: [place] CATH LAB;  Service: Cardiovascular;  Laterality: N/A;   LUMBAR DISC SURGERY  1981   PERCUTANEOUS STENT INTERVENTION Right 03/17/2014   Procedure: PERCUTANEOUS STENT INTERVENTION;  Surgeon: Wellington Hampshire, MD;  [facility];  Service: Cardiovascular;  Laterality: Right;  Renal   RENAL ANGIOGRAM N/A 02/13/2012   Procedure: RENAL ANGIOGRAM;  Surgeon: Wellington Hampshire, MD;  Location: [place] CATH LAB;  Service: Cardiovascular;  Laterality: N/A;   RENAL ANGIOGRAM N/A 03/17/2014   Procedure: RENAL ANGIOGRAM;  Surgeon: Wellington Hampshire, MD;  Location: [place] CATH LAB;  Service: Cardiovascular;  Laterality: N/A;   SCLEROTHERAPY  09/15/2020   Procedure: SCLEROTHERAPY;  Surgeon: [name], MD;  [facility];  Service: Endoscopy;;   TEE WITHOUT CARDIOVERSION N/A 03/02/2020   Procedure: TRANSESOPHAGEAL ECHOCARDIOGRAM (TEE);  Surgeon: Grace Isaac, MD;  Location: [facility];  Service: Open Heart Surgery;  Laterality: N/A;   TONSILLECTOMY AND ADENOIDECTOMY  1947    SOCIAL HISTORY:   Social History   Socioeconomic History   Marital status: Widowed    Spouse name: Not on file   Number of children: 1   Years of education: 36   Highest education level: Not on file  Occupational History   Occupation: Retired    Comment: Scientist, clinical (histocompatibility and immunogenetics) at [employer] Use   Smoking status: Former    Packs/day: 0.20    Years: 1.00    Pack years: 0.20    Types: Cigarettes    Quit date: 11/13/1979    Years since quitting: 41.7   Smokeless tobacco: Never   Tobacco comments:    "smoked a few months"  Vaping Use   Vaping Use: Never used  Substance and Sexual Activity   Alcohol use: No    Alcohol/week: 0.0 standard drinks   Drug use: Never   Sexual activity: Not on file    Comment: Hysterectomy  Other Topics Concern   Not on file  Social History Narrative   Drinks 1/2 caf coffee 3-4 cups a day    Social Determinants of Health   Financial Resource Strain: Low Risk  Difficulty of Paying Living Expenses: Not hard at all  Food Insecurity: No Food Insecurity   Worried About [place] in the Last Year: Never true   Ran Out of Food in the Last Year: Never true  Transportation Needs: No Transportation Needs   Lack of Transportation (Medical): No   Lack of Transportation (Non-Medical): No  Physical Activity: Inactive   Days of Exercise per Week: 0 days   Minutes of Exercise per Session: 0 min  Stress: Stress Concern Present   Feeling of Stress : To some extent  Social Connections: Moderately Isolated   Frequency of Communication with Friends and Family: Three times a week   Frequency of Social Gatherings with Friends and Family: Three times a week   Attends Religious Services: More than 4 times per year   Active Member of Clubs or Organizations: No   Attends Archivist Meetings: Never   Marital Status: Widowed  Human resources officer Violence: Not At Risk   Fear of Current or Ex-Partner: No   Emotionally Abused: No   Physically Abused: No   Sexually Abused:  No    FAMILY HISTORY:  Family History  Problem Relation Age of Onset   Heart failure Mother    Coronary artery disease Mother    Heart disease Mother    Hypertension Mother    Stroke Mother    Heart attack Father    Heart disease Father    Hypertension Father    Heart disease Brother    Hypertension Sister    Parkinson's disease Sister    Kidney disease Sister    Stroke Sister    Hypertension Brother    Stroke Sister    Hypertension Sister    Breast cancer Neg Hx     CURRENT MEDICATIONS:  Current Outpatient Medications  Medication Sig Dispense Refill   acetaminophen (TYLENOL) 500 MG tablet Take 500 mg by mouth every 6 (six) hours as needed for mild pain or moderate pain.      amLODipine (NORVASC) 5 MG tablet Take 1 tablet (5 mg total) by mouth at bedtime. 90 tablet 3   aspirin EC 325 MG EC tablet Take 1 tablet (325 mg total) by mouth daily. (Patient taking differently: Take 81 mg by mouth daily.) 30 tablet 0   Cholecalciferol (VITAMIN D3) 1000 UNITS CAPS Take 1,000 Units by mouth daily.      clonazePAM (KLONOPIN) 0.5 MG tablet Take 0.25 mg by mouth at bedtime.     docusate sodium (COLACE) 100 MG capsule Take 100 mg by mouth in the morning and at bedtime.     isosorbide mononitrate (IMDUR) 30 MG 24 hr tablet TAKE 1/2 TABLET BY MOUTH EVERY DAY 45 tablet 1   metoprolol tartrate (LOPRESSOR) 50 MG tablet Take 1 tablet (50 mg total) by mouth 2 (two) times daily. 180 tablet 1   Multiple Vitamins-Minerals (PRESERVISION AREDS 2 PO) Take 1 tablet by mouth in the morning and at bedtime.     nitroGLYCERIN (NITROSTAT) 0.4 MG SL tablet Place 0.4 mg under the tongue every 5 (five) minutes as needed for chest pain.     rosuvastatin (CRESTOR) 20 MG tablet TAKE 1 TABLET BY MOUTH AT BEDTIME 90 tablet 3   No current facility-administered medications for this visit.    ALLERGIES:  Allergies  Allergen Reactions   Cefixime Shortness Of Breath    Patient is not familiar with this listed  allergy   Conjugated Estrogens Other (See Comments)    REACTION:  flushing   Morphine And Related Other (See Comments)    Morphine injection Neck pain   Codeine Other (See Comments)    REACTION: dizziness   Iohexol      Desc: pt has anxiety and an irregular heartbeat. contrast exacerbates this. pt was very uncomfortable after a 20cc bolus for an miroi. we did w/o iv 11/09 per dr. Kris Hartmann. pt will talk w/ md about this and will probably not get iv contrast in the future at her re, Onset Date: [id number] Multi CT's with contrast done at [facility] without pre meds, Heart Cath without pre med documented 2021. CT A/P with contrast without premeds 11/2020 without issues     Adenosine Other (See Comments)    Elevated heart rate when doing stress test    PHYSICAL EXAM:  Performance status (ECOG): 1 - Symptomatic but completely ambulatory  There were no vitals filed for this visit. Wt Readings from Last 3 Encounters:  07/19/21 121 lb 6.4 oz (55.1 kg)  05/24/21 121 lb 3.2 oz (55 kg)  05/02/21 118 lb 9.6 oz (53.8 kg)   Physical Exam Vitals reviewed.  Constitutional:      Appearance: Normal appearance.  Cardiovascular:     Rate and Rhythm: Normal rate and regular rhythm.     Pulses: Normal pulses.     Heart sounds: Normal heart sounds.  Pulmonary:     Effort: Pulmonary effort is normal.     Breath sounds: Normal breath sounds.  Abdominal:     Palpations: Abdomen is soft. There is no hepatomegaly, splenomegaly or mass.     Tenderness: There is no abdominal tenderness.  Neurological:     General: No focal deficit present.     Mental Status: She is alert and oriented to person, place, and time.  Psychiatric:        Mood and Affect: Mood normal.        Behavior: Behavior normal.     LABORATORY DATA:  I have reviewed the labs as listed.  CBC Latest Ref Rng & Units 08/02/2021 04/25/2021 12/29/2020  WBC 4.0 - 10.5 K/uL 3.5(L) 4.5 4.2  Hemoglobin 12.0 - 15.0 g/dL 13.9 13.9 13.4  Hematocrit 36.0 -  46.0 % 42.3 42.9 45.7  Platelets 150 - 400 K/uL 171 166 217   CMP Latest Ref Rng & Units 08/02/2021 04/25/2021 11/22/2020  Glucose 70 - 99 mg/dL 141(H) 115(H) 125(H)  BUN 8 - 23 mg/dL 18 23 22   Creatinine 0.44 - 1.00 mg/dL 1.00 0.72 0.87  Sodium 135 - 145 mmol/L 136 136 136  Potassium 3.5 - 5.1 mmol/L 4.2 4.1 3.8  Chloride 98 - 111 mmol/L 100 100 104  CO2 22 - 32 mmol/L 28 27 27   Calcium 8.9 - 10.3 mg/dL 10.2 10.3 9.6  Total Protein 6.5 - 8.1 g/dL 7.1 7.3 6.7  Total Bilirubin 0.3 - 1.2 mg/dL 0.6 0.9 0.4  Alkaline Phos 38 - 126 U/L 86 86 94  AST 15 - 41 U/L 26 26 20   ALT 0 - 44 U/L 27 29 18     DIAGNOSTIC IMAGING:  I have independently reviewed the scans and discussed with the patient. No results found.   ASSESSMENT:  1.  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 - CT AP with contrast on 08/10/2020 with abnormal appearance of right colon with colocolonic intussusception with wall thickening and abnormally enlarged adjacent right mesenteric lymph nodes. - CT chest with contrast on 09/07/2020 with  no evidence of metastatic disease in the chest. -Colonoscopy on 09/15/2020-large spherical, ulcerated, partially necrotic tumor in the transverse/right colon.  Scope could not be maneuvered more proximally to the tumor. - Right hemicolectomy on 10/28/2020 by Dr. Dema Severin. - Pathology shows invasive moderately to poorly differentiated carcinoma involving the mid ascending colon, spanning 10 cm, tumor invades through muscularis propria into pericolorectal tissue, margins negative.  9/39 lymph nodes involved.  Lymphovascular invasion present.  PT3PN2B. - Loss of MLH1 and PMS2. - BRAF V600 E mutation positive.  MSI-high. -She has refused adjuvant chemotherapy.   2.  Social/family history: - She is accompanied by her daughter today.  Lives by herself and is independent of ADLs and IADLs.  Worked as a Catering manager. - Maternal  aunt had ovarian cancer.  No other malignancies.   PLAN:  1.  Stage IIIc (PT3PN2B) adenocarcinoma of ascending colon: - She does not report any change in bowel habits.  No bleeding per rectum or melena. - She will have a colonoscopy in November by Dr. Ardis Hughs. - CEA is 1.8.  Last CT scan was on 04/25/2021.  LFTs from 08/02/2021 were normal. - RTC 3 months with repeat labs, CTAP.   2.  Microcytic anemia: - She discontinued iron tablet in June. - Reviewed hemoglobin 13.9.  Ferritin is 125 and percent saturation is 25.   Orders placed this encounter:  No orders of the defined types were placed in this encounter.    Derek Jack, MD [place] [phone]   I, Thana Ates, am acting as a scribe for Dr. Derek Jack.  I, Derek Jack MD, have reviewed the above documentation for accuracy and completeness, and I agree with the above.

## 2021-08-09 ENCOUNTER — Other Ambulatory Visit: Payer: Self-pay

## 2021-08-09 ENCOUNTER — Inpatient Hospital Stay (HOSPITAL_BASED_OUTPATIENT_CLINIC_OR_DEPARTMENT_OTHER): Payer: Medicare Other | Admitting: Hematology

## 2021-08-09 DIAGNOSIS — R0602 Shortness of breath: Secondary | ICD-10-CM | POA: Diagnosis not present

## 2021-08-09 DIAGNOSIS — R002 Palpitations: Secondary | ICD-10-CM | POA: Diagnosis not present

## 2021-08-09 DIAGNOSIS — C182 Malignant neoplasm of ascending colon: Secondary | ICD-10-CM

## 2021-08-09 DIAGNOSIS — R5383 Other fatigue: Secondary | ICD-10-CM | POA: Diagnosis not present

## 2021-08-09 DIAGNOSIS — D509 Iron deficiency anemia, unspecified: Secondary | ICD-10-CM | POA: Diagnosis not present

## 2021-08-09 DIAGNOSIS — R42 Dizziness and giddiness: Secondary | ICD-10-CM | POA: Diagnosis not present

## 2021-08-09 MED ORDER — DIPHENHYDRAMINE HCL 50 MG PO TABS: 50.0000 mg | ORAL_TABLET | Freq: Once | ORAL | 0 refills | Status: DC

## 2021-08-09 MED ORDER — PREDNISONE 50 MG PO TABS: ORAL_TABLET | ORAL | 0 refills | Status: DC

## 2021-08-09 NOTE — Patient Instructions (Addendum)
[place] at [facility] Discharge Instructions  You were seen today by Dr. Delton Coombes. He went over your recent results. You will be scheduled for a CT scan of your abdomen and pelvis prior to your next appointment. Dr. Delton Coombes will see you back in 3 months for labs and follow up.   Thank you for choosing [employer] [facility] [employer] at [facility] to provide your oncology and hematology care.  To afford each patient quality time with our provider, please arrive at least 15 minutes before your scheduled appointment time.   If you have a lab appointment with the [facility] please come in thru the Main Entrance and check in at the main information desk  You need to re-schedule your appointment should you arrive 10 or more minutes late.  We strive to give you quality time with our providers, and arriving late affects you and other patients whose appointments are after yours.  Also, if you no show three or more times for appointments you may be dismissed from the clinic at the providers discretion.     Again, thank you for choosing [facility].  Our hope is that these requests will decrease the amount of time that you wait before being seen by our physicians.       _____________________________________________________________  Should you have questions after your visit to [facility], please contact our office at [phone] between the hours of 8:00 a.m. and 4:30 p.m.  Voicemails left after 4:00 p.m. will not be returned until the following business day.  For prescription refill requests, have your pharmacy contact our office and allow 72 hours.    Cancer Center Support Programs:   > Cancer Support Group  2nd Tuesday of the month 1pm-2pm, Journey Room

## 2021-08-11 DIAGNOSIS — I10 Essential (primary) hypertension: Secondary | ICD-10-CM | POA: Diagnosis not present

## 2021-08-22 ENCOUNTER — Ambulatory Visit: Payer: Medicare Other | Admitting: Gastroenterology

## 2021-08-31 ENCOUNTER — Encounter: Payer: Self-pay | Admitting: Family Medicine

## 2021-08-31 DIAGNOSIS — H6123 Impacted cerumen, bilateral: Secondary | ICD-10-CM | POA: Diagnosis not present

## 2021-08-31 DIAGNOSIS — H9222 Otorrhagia, left ear: Secondary | ICD-10-CM | POA: Diagnosis not present

## 2021-09-14 DIAGNOSIS — Z23 Encounter for immunization: Secondary | ICD-10-CM | POA: Diagnosis not present

## 2021-09-18 DIAGNOSIS — Z20828 Contact with and (suspected) exposure to other viral communicable diseases: Secondary | ICD-10-CM | POA: Diagnosis not present

## 2021-10-04 ENCOUNTER — Ambulatory Visit: Payer: Medicare Other | Admitting: Neurology

## 2021-10-12 DIAGNOSIS — Z299 Encounter for prophylactic measures, unspecified: Secondary | ICD-10-CM | POA: Diagnosis not present

## 2021-10-12 DIAGNOSIS — E1165 Type 2 diabetes mellitus with hyperglycemia: Secondary | ICD-10-CM | POA: Diagnosis not present

## 2021-10-12 DIAGNOSIS — I700 Atherosclerosis of aorta: Secondary | ICD-10-CM | POA: Diagnosis not present

## 2021-10-12 DIAGNOSIS — I10 Essential (primary) hypertension: Secondary | ICD-10-CM | POA: Diagnosis not present

## 2021-10-12 DIAGNOSIS — I471 Supraventricular tachycardia: Secondary | ICD-10-CM | POA: Diagnosis not present

## 2021-10-13 ENCOUNTER — Telehealth: Payer: Self-pay | Admitting: Cardiology

## 2021-10-13 NOTE — Telephone Encounter (Signed)
Please change her upcoming appointment to virtual (telephone) call & make patient aware.

## 2021-10-13 NOTE — Telephone Encounter (Signed)
Pt would like to know if she can come in for a virtual appt is afraid of being in contact with flu patients

## 2021-10-14 ENCOUNTER — Other Ambulatory Visit: Payer: Self-pay | Admitting: Cardiology

## 2021-10-17 NOTE — Telephone Encounter (Signed)
Says she will come to office for visit

## 2021-10-18 ENCOUNTER — Encounter: Payer: Self-pay | Admitting: Cardiology

## 2021-10-18 ENCOUNTER — Ambulatory Visit (INDEPENDENT_AMBULATORY_CARE_PROVIDER_SITE_OTHER): Payer: Medicare Other | Admitting: Cardiology

## 2021-10-18 VITALS — BP 136/84 | HR 68 | Ht 60.0 in | Wt 125.0 lb

## 2021-10-18 DIAGNOSIS — I25119 Atherosclerotic heart disease of native coronary artery with unspecified angina pectoris: Secondary | ICD-10-CM

## 2021-10-18 DIAGNOSIS — I10 Essential (primary) hypertension: Secondary | ICD-10-CM

## 2021-10-18 NOTE — Progress Notes (Signed)
Cardiology Office Note  Date: 10/18/2021   ID: [name], [name] 06/21/1936, MRN [id number]  PCP:  Glenda Chroman, MD  Cardiologist:  Rozann Lesches, MD Electrophysiologist:  None   Chief Complaint  Patient presents with   Cardiac follow-up    History of Present Illness: [name] is an 85 y.o. female last seen in September.  She is here for a routine follow-up visit.  She reports no progressive angina symptoms or nitroglycerin use on present medical therapy.  Generally feels well, but she does have ups and downs.  Feels somewhat worse after she takes her metoprolol, but per review of home blood pressure and heart rate, it does not look as if she has symptomatic bradycardia or low blood pressures.  Her blood pressure in fact is better controlled today.  I reviewed her ECG which shows sinus rhythm with left atrial enlargement and old anterior infarct pattern.  We went over her current medications with plan to continue present regimen.  She has not had to use any as needed Clonidine.  Past Medical History:  Diagnosis Date   Anemia    Anxiety    Arthritis    CAD (coronary artery disease)    Status post CABG April 2021 - Dr. Servando Snare   Chronic constipation    Claudication [facility])    Chronic occlusion of the right limb of aortic stent graft.   Colon cancer ([place])    Diverticulosis    Left sided noted on colonoscopy 02/2006   Ectopic atrial tachycardia (HCC)    Esophageal reflux disease    Essential hypertension    Hiatal hernia    History of TIA (transient ischemic attack)    Hyperlipidemia    Lumbar radiculopathy    Macular degeneration    Migraines    Penetrating atherosclerotic ulcer of aorta (HCC)    Complex ulcerated plaque of the infrarenal abdominal aorta mild renal artery stenosis on the right side, normal left renal artery catheterization 2009, status post aortic stent graft   Renal artery stenosis ([place])    a. Right renal artery stent April 2013 - Dr. Fletcher Anon;   b. 03/2014 PTA/stenting of RRA 2/2 ISR: 6x18 Herculink stent.   Trigeminal neuralgia    Right   Type 2 diabetes mellitus ([place])     Past Surgical History:  Procedure Laterality Date   ABDOMINAL AORTAGRAM N/A 02/13/2012   Procedure: ABDOMINAL Maxcine Ham;  Surgeon: Wellington Hampshire, MD;  Location: [place] CATH LAB;  Service: Cardiovascular;  Laterality: N/A;   ABDOMINAL AORTAGRAM N/A 03/17/2014   Procedure: ABDOMINAL Maxcine Ham;  Surgeon: Wellington Hampshire, MD;  Location: [place] CATH LAB;  Service: Cardiovascular;  Laterality: N/A;   ABDOMINAL HYSTERECTOMY  1982   BIOPSY  09/15/2020   Procedure: BIOPSY;  Surgeon: [name], MD;  Location: [facility];  Service: Endoscopy;;   BREAST BIOPSY Left 1990's   CAROTID ENDARTERECTOMY Left 1993   COLONOSCOPY     COLONOSCOPY WITH PROPOFOL N/A 09/15/2020   Procedure: COLONOSCOPY WITH PROPOFOL;  Surgeon: [name], MD;  Location: [facility]OS[facility];  Service: Endoscopy;  Laterality: N/A;   CORONARY ARTERY BYPASS GRAFT N/A 03/02/2020   Procedure: CORONARY ARTERY BYPASS GRAFTING (CABG)x 3, LIMA TO LAD, SVG TO DIAGONAL, SVG TO RCA, WITH OPEN HARVESTING OF LEFT LOWER GREATER SAPHENOUS VEIN AND RIGHT AND LEFT GREATER SAPHENOUS VEIN HARVESTED ENDOSCOPICALLY;  Surgeon: Grace Isaac, MD;  Location: [place];  Service: Open Heart Surgery;  Laterality: N/A;   Endologix  powerlink bifurcated     Covered stent graft   ENDOVEIN HARVEST OF GREATER SAPHENOUS VEIN Bilateral 03/02/2020   Procedure: [place] Of Greater Saphenous Vein;  Surgeon: Grace Isaac, MD;  Location: [place];  Service: Open Heart Surgery;  Laterality: Bilateral;   LAPAROSCOPIC RIGHT HEMI COLECTOMY Right 10/28/2020   Procedure: LAPAROSCOPIC RIGHT HEMI COLECTOMY,;  Surgeon: Ileana Roup, MD;  [facility]: [facility];  Service: General;  Laterality: Right;   LEFT HEART CATH AND CORONARY ANGIOGRAPHY N/A 01/28/2020   Procedure: LEFT HEART CATH AND CORONARY ANGIOGRAPHY;  Surgeon: Nelva Bush, MD;   Location: [place] CV LAB;  Service: Cardiovascular;  Laterality: N/A;   LEFT HEART CATHETERIZATION WITH CORONARY ANGIOGRAM N/A 03/17/2014   Procedure: LEFT HEART CATHETERIZATION WITH CORONARY ANGIOGRAM;  Surgeon: Wellington Hampshire, MD;  Location: [place] CATH LAB;  Service: Cardiovascular;  Laterality: N/A;   LUMBAR DISC SURGERY  1981   PERCUTANEOUS STENT INTERVENTION Right 03/17/2014   Procedure: PERCUTANEOUS STENT INTERVENTION;  Surgeon: Wellington Hampshire, MD;  Location: [place] CATH LAB;  Service: Cardiovascular;  Laterality: Right;  Renal   RENAL ANGIOGRAM N/A 02/13/2012   Procedure: RENAL ANGIOGRAM;  Surgeon: Wellington Hampshire, MD;  Location: [place] CATH LAB;  Service: Cardiovascular;  Laterality: N/A;   RENAL ANGIOGRAM N/A 03/17/2014   Procedure: RENAL ANGIOGRAM;  Surgeon: Wellington Hampshire, MD;  Location: [place] CATH LAB;  Service: Cardiovascular;  Laterality: N/A;   SCLEROTHERAPY  09/15/2020   Procedure: SCLEROTHERAPY;  Surgeon: [name], MD;  [facility];  Service: Endoscopy;;   TEE WITHOUT CARDIOVERSION N/A 03/02/2020   Procedure: TRANSESOPHAGEAL ECHOCARDIOGRAM (TEE);  Surgeon: Grace Isaac, MD;  Location: [place];  Service: Open Heart Surgery;  Laterality: N/A;   TONSILLECTOMY AND ADENOIDECTOMY  1947    Current Outpatient Medications  Medication Sig Dispense Refill   acetaminophen (TYLENOL) 500 MG tablet Take 500 mg by mouth every 6 (six) hours as needed for mild pain or moderate pain.     amLODipine (NORVASC) 5 MG tablet TAKE 1 TABLET BY MOUTH AT BEDTIME 90 tablet 1   aspirin EC 81 MG tablet Take 81 mg by mouth daily. Swallow whole.     cetirizine (ZYRTEC) 10 MG tablet Take 10 mg by mouth at bedtime.     Cholecalciferol (VITAMIN D3) 1000 UNITS CAPS Take 1,000 Units by mouth daily.      cloNIDine (CATAPRES) 0.1 MG tablet Take 0.1 mg by mouth at bedtime.     docusate sodium (COLACE) 100 MG capsule Take 100 mg by mouth in the morning and at bedtime.     isosorbide mononitrate (IMDUR) 30  MG 24 hr tablet TAKE 1/2 TABLET BY MOUTH EVERY DAY 45 tablet 1   metoprolol tartrate (LOPRESSOR) 50 MG tablet Take 1 tablet (50 mg total) by mouth 2 (two) times daily. 180 tablet 1   Multiple Vitamins-Minerals (PRESERVISION AREDS 2 PO) Take 1 tablet by mouth in the morning and at bedtime.     nitroGLYCERIN (NITROSTAT) 0.4 MG SL tablet Place 0.4 mg under the tongue every 5 (five) minutes as needed for chest pain.     rosuvastatin (CRESTOR) 20 MG tablet TAKE 1 TABLET BY MOUTH AT BEDTIME 90 tablet 3   No current facility-administered medications for this visit.   Allergies:  Cefixime, Conjugated estrogens, Morphine and related, Codeine, Iohexol, and Adenosine   ROS: No palpitations.  No syncope.  Physical Exam: VS:  BP 136/84 (BP Location: Left Arm, Patient Position: Sitting, Cuff Size:  Normal)   Pulse 68   Ht 5' (1.524 m)   Wt 125 lb (56.7 kg)   BMI 24.41 kg/m , BMI Body mass index is 24.41 kg/m.  Wt Readings from Last 3 Encounters:  10/18/21 125 lb (56.7 kg)  07/19/21 121 lb 6.4 oz (55.1 kg)  05/24/21 121 lb 3.2 oz (55 kg)    General: Patient appears comfortable at rest. HEENT: Conjunctiva and lids normal, wearing a mask. Neck: Supple, no elevated JVP or carotid bruits, no thyromegaly. Lungs: Clear to auscultation, nonlabored breathing at rest. Cardiac: Regular rate and rhythm, no S3, 1/6 systolic murmur, no pericardial rub. Extremities: No pitting edema.  ECG:  An ECG dated 10/03/2020 was personally reviewed today and demonstrated:  Sinus rhythm with left atrial enlargement and nonspecific ST-T changes.  Recent Labwork: 11/22/2020: Magnesium 2.2 08/02/2021: ALT 27; AST 26; BUN 18; Creatinine, Ser 1.00; Hemoglobin 13.9; Platelets 171; Potassium 4.2; Sodium 136     Component Value Date/Time   CHOL 130 01/29/2020 0202   TRIG 105 01/29/2020 0202   HDL 38 (L) 01/29/2020 0202   CHOLHDL 3.4 01/29/2020 0202   VLDL 21 01/29/2020 0202   LDLCALC 71 01/29/2020 0202    Other Studies  Reviewed Today:  Echocardiogram 03/01/2020:  1. Left ventricular ejection fraction, by estimation, is 55 to 60%. The  left ventricle has normal function. The left ventricle has no regional  wall motion abnormalities. There is mild left ventricular hypertrophy of  the basal-septal segment. Left  ventricular diastolic parameters are indeterminate.   2. Right ventricular systolic function is normal. The right ventricular  size is normal. There is normal pulmonary artery systolic pressure.   3. The mitral valve is normal in structure. Mild mitral valve  regurgitation. No evidence of mitral stenosis.   4. The aortic valve is tricuspid. Aortic valve regurgitation is not  visualized. Mild to moderate aortic valve sclerosis/calcification is  present, without any evidence of aortic stenosis.   5. The inferior vena cava is normal in size with greater than 50%  respiratory variability, suggesting right atrial pressure of 3 mmHg.   Lexiscan Myoview 09/09/2020: There was no ST segment deviation noted during stress. Findings consistent with prior small inferior myocardial infarction. This is a low risk study. There is no current ischemia. The left ventricular ejection fraction is hyperdynamic (>65%).  Assessment and Plan:  1.  Multivessel CAD status post CABG in April 2021.  No progressive angina symptoms or nitroglycerin use.  I reviewed her ECG.  Continue aspirin, Norvasc, Imdur, Lopressor, and Crestor.  2.  Essential hypertension, no change in current regimen with systolic in the 627O today.  3.  Stage IIIc adenocarcinoma of the ascending colon status post right hemicolectomy in December 2021.  She declined adjuvant chemotherapy.  Medication Adjustments/Labs and Tests Ordered: Current medicines are reviewed at length with the patient today.  Concerns regarding medicines are outlined above.   Tests Ordered: Orders Placed This Encounter  Procedures   EKG 12-Lead     Medication  Changes: No orders of the defined types were placed in this encounter.   Disposition:  Follow up  6 months.  Signed, [name], MD, [facility] 10/18/2021 2:00 PM    [facility] [place] [facility] [place], [place], [place] [address] Phone: [phone]; Fax: [phone]

## 2021-10-18 NOTE — Patient Instructions (Addendum)

## 2021-10-19 ENCOUNTER — Encounter: Payer: Self-pay | Admitting: Neurology

## 2021-10-19 ENCOUNTER — Ambulatory Visit (INDEPENDENT_AMBULATORY_CARE_PROVIDER_SITE_OTHER): Payer: Medicare Other | Admitting: Neurology

## 2021-10-19 ENCOUNTER — Other Ambulatory Visit: Payer: Self-pay | Admitting: Cardiology

## 2021-10-19 VITALS — BP 173/91 | HR 63 | Ht 62.0 in | Wt 125.4 lb

## 2021-10-19 DIAGNOSIS — G43711 Chronic migraine without aura, intractable, with status migrainosus: Secondary | ICD-10-CM | POA: Diagnosis not present

## 2021-10-19 DIAGNOSIS — R51 Headache with orthostatic component, not elsewhere classified: Secondary | ICD-10-CM

## 2021-10-19 DIAGNOSIS — C189 Malignant neoplasm of colon, unspecified: Secondary | ICD-10-CM | POA: Diagnosis not present

## 2021-10-19 DIAGNOSIS — I25119 Atherosclerotic heart disease of native coronary artery with unspecified angina pectoris: Secondary | ICD-10-CM

## 2021-10-19 DIAGNOSIS — C7931 Secondary malignant neoplasm of brain: Secondary | ICD-10-CM

## 2021-10-19 DIAGNOSIS — R519 Headache, unspecified: Secondary | ICD-10-CM

## 2021-10-19 DIAGNOSIS — G8929 Other chronic pain: Secondary | ICD-10-CM

## 2021-10-19 MED ORDER — AJOVY 225 MG/1.5ML ~~LOC~~ SOAJ: 225.0000 mg | SUBCUTANEOUS | 3 refills | Status: DC

## 2021-10-19 NOTE — Progress Notes (Signed)
[facility]    Provider:  Dr Jaynee Eagles Requesting Provider: [name], MD Primary Care Provider:  Glenda Chroman, MD  CC:  Migraines  HPI:  [name] is a 85 y.o. female here as requested by [name], MD for morning headaches/migraines. Daughter here and provides much information.  Past medical history anxiety, coronary artery disease, colon cancer, constipation, claudication right limb of aortic stent graft, ectopic atrial tachycardia, hypertension, history of TIA, hyperlipidemia, migraines, lumbar radiculopathy, macular degeneration, trigeminal neuralgia, type 2 diabetes.  I reviewed Dr. Myles Gip notes, patient presented for follow-up mainly about concerns with recurring headaches, stating that this has been a problem for years but has been getting worse, also associated dizziness and inner ear problems, she had her ear walk cleaned out by PCP, she was told years ago that she had cluster headaches, she has been on multiple medications including metoprolol and Norvasc, clonidine in the past, labetalol and multiple other medications, coming off Imdur had no impact on her headaches.  I also reviewed neurology notes from 2017, seen for longstanding history of recurrent headaches, some photophobia, and nausea vomiting, different locations, pulsating, tried amitriptyline she had side effects, older sister had migraines, she has a constant daily headache, does not think she snores, has been followed by rheumatology in the past, had MRIs of the brain, she wakes with headaches, they can be pounding, bilateral unilateral, she denied any autonomic symptoms, she tried diclofenac.  At that time she had ongoing headaches for 10 years, gabapentin.  It was recommended she have a sleep apnea test.  ESR and CRP in the past have been normal.  She has headaches daily, she sleeps well but she sits up and it is there, right now it is on the top of the head and it moves around. She  takes tylenol, it knocks the edge off, ongoing for years. She gets zig zags in her eyes and then a head that is worse, pulsating/pounding/throbbing, some mild nausea, she sits in a chair the zig zags go away, dull, pressure, sometimes can be pulsating, sister with migraines. Doesn't really helps, nothing really helps her headache, after she eats she may feel better, Lying down makes it some better, seems to help, may worsen positionally, she averages 1-2 tylenol a day everyday. Unknown snoring. Headache is there when she wakes up, gets worse during the day. Poor historian.   Medications tried that can be used with migraine management include: Amlodipine, Benadryl, metoprolol, labetalol, ibuprofen, Zofran, prednisone, Compazine, tramadol, gabapentin, amitriptyline, triptans are contraindicated due to stroke, inflammatory labs such as ESR and CRP in the past have been normal.  Reviewed notes, labs and imaging from outside physicians, which showed:  MRI brain 2017: IMPRESSION:    This MRI of the brain without contrast shows the following: reviewed imaging and agree: 1.    Mild cortical atrophy that has progressed since 03/07/2005. The extent is typical for age. 2.    Chronic microvascular ischemic change and a chronic left occipital lacunar infarction. There has been some progression in the number foci since 2006. 3.    There are no acute findings.    Review of Systems: Patient complains of symptoms per HPI as well as the following symptoms colon cancer, headaches. Pertinent negatives and positives per HPI. All others negative.   Social History   Socioeconomic History   Marital status: Widowed    Spouse name: Not on file   Number of children: 1   Years  of education: 12   Highest education level: Not on file  Occupational History   Occupation: Retired    Comment: Scientist, clinical (histocompatibility and immunogenetics) at [employer] Use   Smoking status: Former    Packs/day: 0.20    Years: 1.00    Pack years: 0.20    Types:  Cigarettes    Quit date: 11/13/1979    Years since quitting: 41.9   Smokeless tobacco: Never   Tobacco comments:    "smoked a few months"  Vaping Use   Vaping Use: Never used  Substance and Sexual Activity   Alcohol use: No    Alcohol/week: 0.0 standard drinks   Drug use: Never   Sexual activity: Not on file    Comment: Hysterectomy  Other Topics Concern   Not on file  Social History Narrative   Drinks 1/2 caf coffee 3-4 cups a day    Social Determinants of Health   Financial Resource Strain: Low Risk    Difficulty of Paying Living Expenses: Not hard at all  Food Insecurity: No Food Insecurity   Worried About Charity fundraiser in the Last Year: Never true   Arboriculturist in the Last Year: Never true  Transportation Needs: No Transportation Needs   Lack of Transportation (Medical): No   Lack of Transportation (Non-Medical): No  Physical Activity: Inactive   Days of Exercise per Week: 0 days   Minutes of Exercise per Session: 0 min  Stress: Stress Concern Present   Feeling of Stress : To some extent  Social Connections: Moderately Isolated   Frequency of Communication with Friends and Family: Three times a week   Frequency of Social Gatherings with Friends and Family: Three times a week   Attends Religious Services: More than 4 times per year   Active Member of Clubs or Organizations: No   Attends Archivist Meetings: Never   Marital Status: Widowed  Human resources officer Violence: Not At Risk   Fear of Current or Ex-Partner: No   Emotionally Abused: No   Physically Abused: No   Sexually Abused: No    Family History  Problem Relation Age of Onset   Heart failure Mother    Coronary artery disease Mother    Heart disease Mother    Hypertension Mother    Stroke Mother    Heart attack Father    Heart disease Father    Hypertension Father    Hypertension Sister    Parkinson's disease Sister    Migraines Sister    Kidney disease Sister    Stroke Sister     Stroke Sister    Hypertension Sister    Heart disease Brother    Hypertension Brother    Breast cancer Neg Hx     Past Medical History:  Diagnosis Date   Anemia    Anxiety    Arthritis    CAD (coronary artery disease)    Status post CABG April 2021 - Dr. Servando Snare   Chronic constipation    Claudication [facility])    Chronic occlusion of the right limb of aortic stent graft.   Colon cancer ([place])    Diverticulosis    Left sided noted on colonoscopy 02/2006   Ectopic atrial tachycardia (HCC)    Esophageal reflux disease    Essential hypertension    Hiatal hernia    History of TIA (transient ischemic attack)    Hyperlipidemia    Lumbar radiculopathy    Macular degeneration    Migraines  Penetrating atherosclerotic ulcer of aorta (HCC)    Complex ulcerated plaque of the infrarenal abdominal aorta mild renal artery stenosis on the right side, normal left renal artery catheterization 2009, status post aortic stent graft   Renal artery stenosis ([place])    a. Right renal artery stent April 2013 - Dr. Fletcher Anon;  b. 03/2014 PTA/stenting of RRA 2/2 ISR: 6x18 Herculink stent.   Trigeminal neuralgia    Right   Type 2 diabetes mellitus [facility])     Patient Active Problem List   Diagnosis Date Noted   Cancer of ascending colon ([place]) 01/15/2021   Body mass index (BMI) 22.0-22.9, adult 12/01/2020   Eustachian tube dysfunction 12/01/2020   Type 2 diabetes mellitus with hyperglycemia ([place]) 12/01/2020   S/P right hemicolectomy 10/28/2020   Coronary artery disease 03/02/2020   S/P CABG (coronary artery bypass graft) 03/02/2020   Intermediate stage nonexudative age-related macular degeneration of both eyes 02/16/2020   Serous detachment of retinal pigment epithelium of left eye 02/16/2020   Pseudophakia 02/16/2020   Posterior vitreous detachment of both eyes 02/16/2020   Accelerating angina (HCC) 01/28/2020   Chronic daily headache 02/07/2016   RAS (renal artery stenosis) ([place]) 03/17/2014   AAA  (abdominal aortic aneurysm) 11/28/2012   Atherosclerosis of renal artery ([place]) 11/28/2012   Encounter for preventive measure 11/28/2012   Essential hypertension 11/28/2012   Peripheral blood vessel disorder ([place]) 11/28/2012   Coronary artery disease excluded 11/28/2012   Abdominal aneurysm 11/28/2012   Peripheral vascular disease ([place]) 11/28/2012   Renal artery stenosis (HCC)    Essential hypertension, benign    Claudication ([place])    Penetrating atherosclerotic ulcer of aorta (HCC)    Carotid artery disease ([place]) 02/27/2011   Disorder of arteries and arterioles ([place]) 02/27/2011   Hypercholesterolemia 04/03/2010   Hyperlipidemia 04/03/2010   Atherosclerosis of native artery of extremity with intermittent claudication ([place]) 08/29/2009   CAD (coronary artery disease), native coronary artery 09/14/2008   Ectopic atrial tachycardia ([place]) 09/14/2008   Benign hypertensive heart disease 09/14/2008   Chronic tension-type headache 09/14/2008   Generalized anxiety disorder 09/14/2008   Tachycardia 09/14/2008    Past Surgical History:  Procedure Laterality Date   ABDOMINAL AORTAGRAM N/A 02/13/2012   Procedure: ABDOMINAL Maxcine Ham;  Surgeon: Wellington Hampshire, MD;  Location: [place] CATH [facility];  Service: Cardiovascular;  Laterality: N/A;   ABDOMINAL AORTAGRAM N/A 03/17/2014   Procedure: ABDOMINAL Maxcine Ham;  Surgeon: Wellington Hampshire, MD;  Location: [facility];  Service: Cardiovascular;  Laterality: N/A;   ABDOMINAL HYSTERECTOMY  1982   BIOPSY  09/15/2020   Procedure: BIOPSY;  Surgeon: [name], MD;  Location: [facility];  Service: Endoscopy;;   BREAST BIOPSY Left 1990's   CAROTID ENDARTERECTOMY Left 1993   COLONOSCOPY     COLONOSCOPY WITH PROPOFOL N/A 09/15/2020   Procedure: COLONOSCOPY WITH PROPOFOL;  Surgeon: [name], MD;  [facility];  Service: Endoscopy;  Laterality: N/A;   CORONARY ARTERY BYPASS GRAFT N/A 03/02/2020   Procedure: CORONARY ARTERY BYPASS GRAFTING (CABG)x  3, LIMA TO LAD, SVG TO DIAGONAL, SVG TO RCA, WITH OPEN HARVESTING OF LEFT LOWER GREATER SAPHENOUS VEIN AND RIGHT AND LEFT GREATER SAPHENOUS VEIN HARVESTED ENDOSCOPICALLY;  Surgeon: Grace Isaac, MD;  Location: [place];  Service: Open Heart Surgery;  Laterality: N/A;   Endologix powerlink bifurcated     Covered stent graft   ENDOVEIN HARVEST OF GREATER SAPHENOUS VEIN Bilateral 03/02/2020   Procedure: [place] Of Greater Saphenous Vein;  Surgeon:  Grace Isaac, MD;  Location: [place];  Service: Open Heart Surgery;  Laterality: Bilateral;   LAPAROSCOPIC RIGHT HEMI COLECTOMY Right 10/28/2020   Procedure: LAPAROSCOPIC RIGHT HEMI COLECTOMY,;  Surgeon: Ileana Roup, MD;  [facility];  Service: General;  Laterality: Right;   LEFT HEART CATH AND CORONARY ANGIOGRAPHY N/A 01/28/2020   Procedure: LEFT HEART CATH AND CORONARY ANGIOGRAPHY;  Surgeon: Nelva Bush, MD;  Location: [place] [facility] LAB;  Service: Cardiovascular;  Laterality: N/A;   LEFT HEART CATHETERIZATION WITH CORONARY ANGIOGRAM N/A 03/17/2014   Procedure: LEFT HEART CATHETERIZATION WITH CORONARY ANGIOGRAM;  Surgeon: Wellington Hampshire, MD;  Location: [place] C[facility] LAB;  Service: Cardiovascular;  Laterality: N/A;   LUMBAR DISC SURGERY  1981   PERCUTANEOUS STENT INTERVENTION Right 03/17/2014   Procedure: PERCUTANEOUS STENT INTERVENTION;  Surgeon: Wellington Hampshire, MD;  Location: [place] CATH LAB;  Service: Cardiovascular;  Laterality: Right;  Renal   RENAL ANGIOGRAM N/A 02/13/2012   Procedure: RENAL ANGIOGRAM;  Surgeon: Wellington Hampshire, MD;  Location: [place] CATH LAB;  Service: Cardiovascular;  Laterality: N/A;   RENAL ANGIOGRAM N/A 03/17/2014   Procedure: RENAL ANGIOGRAM;  Surgeon: Wellington Hampshire, MD;  Location: [place] C[facility] LAB;  Service: Cardiovascular;  Laterality: N/A;   SCLEROTHERAPY  09/15/2020   Procedure: SCLEROTHERAPY;  Surgeon: [name], MD;  [facility];  Service: Endoscopy;;   TEE WITHOUT CARDIOVERSION N/A  03/02/2020   Procedure: TRANSESOPHAGEAL ECHOCARDIOGRAM (TEE);  Surgeon: Grace Isaac, MD;  Location: [place];  Service: Open Heart Surgery;  Laterality: N/A;   TONSILLECTOMY AND ADENOIDECTOMY  1947    Current Outpatient Medications  Medication Sig Dispense Refill   acetaminophen (TYLENOL) 500 MG tablet Take 500 mg by mouth every 6 (six) hours as needed for mild pain or moderate pain.     amLODipine (NORVASC) 5 MG tablet TAKE 1 TABLET BY MOUTH AT BEDTIME 90 tablet 1   aspirin EC 81 MG tablet Take 81 mg by mouth daily. Swallow whole.     cetirizine (ZYRTEC) 10 MG tablet Take 10 mg by mouth at bedtime.     Cholecalciferol (VITAMIN D3) 1000 UNITS CAPS Take 1,000 Units by mouth daily.      cloNIDine (CATAPRES) 0.1 MG tablet TAKE 1 TABLET BY MOUTH EVERY DAY 90 tablet 3   docusate sodium (COLACE) 100 MG capsule Take 100 mg by mouth in the morning and at bedtime.     Fremanezumab-vfrm (AJOVY) 225 MG/1.5ML SOAJ Inject 225 mg into the skin every 30 (thirty) days. 1.5 mL 3   isosorbide mononitrate (IMDUR) 30 MG 24 hr tablet TAKE 1/2 TABLET BY MOUTH EVERY DAY 45 tablet 1   metoprolol tartrate (LOPRESSOR) 50 MG tablet Take 1 tablet (50 mg total) by mouth 2 (two) times daily. 180 tablet 1   Multiple Vitamins-Minerals (PRESERVISION AREDS 2 PO) Take 1 tablet by mouth in the morning and at bedtime.     nitroGLYCERIN (NITROSTAT) 0.4 MG SL tablet Place 0.4 mg under the tongue every 5 (five) minutes as needed for chest pain.     rosuvastatin (CRESTOR) 20 MG tablet TAKE 1 TABLET BY MOUTH AT BEDTIME 90 tablet 3   No current facility-administered medications for this visit.    Allergies as of 10/19/2021 - Review Complete 10/19/2021  Allergen Reaction Noted   Cefixime Shortness Of Breath 09/14/2008   Conjugated estrogens Other (See Comments) 09/14/2008   Morphine and related Other (See Comments) 09/13/2011   Codeine Other (See Comments) 09/14/2008  Iohexol  09/30/2008   Adenosine Other (See Comments)  09/13/2011    Vitals: BP (!) 173/91   Pulse 63   Ht 5' 2" (1.575 m)   Wt 125 lb 6.4 oz (56.9 kg)   BMI 22.94 kg/m  Last Weight:  Wt Readings from Last 1 Encounters:  10/19/21 125 lb 6.4 oz (56.9 kg)   Last Height:   Ht Readings from Last 1 Encounters:  10/19/21 5' 2" (1.575 m)     Physical exam: Exam: Gen: NAD, conversant, well nourised,  well groomed                     CV: RRR, no MRG. No Carotid Bruits. No peripheral edema, warm, nontender Eyes: Conjunctivae clear without exudates or hemorrhage  Neuro: Detailed Neurologic Exam  Speech:    Speech is normal; fluent and spontaneous with normal comprehension.  Cognition:    The patient is oriented to person, place, and time;     recent and remote memory intact;     language fluent;     normal attention, concentration,     fund of knowledge Cranial Nerves:    The pupils are equal, round, and reactive to light. The fundi are normal and spontaneous venous pulsations are present. Visual fields are full to finger confrontation. Extraocular movements are intact. Trigeminal sensation is intact and the muscles of mastication are normal. The face is symmetric. The palate elevates in the midline. Hearing intact. Voice is normal. Shoulder shrug is normal. The tongue has normal motion without fasciculations.   Coordination:    Normal finger to nose and heel to shin. Normal rapid alternating movements.   Gait:    Heel-toe and tandem gait are normal.   Motor Observation:    No asymmetry, no atrophy, and no involuntary movements noted. Tone:    Normal muscle tone.    Posture:    Posture is normal. normal erect    Strength:    Strength is V/V in the upper and lower limbs.      Sensation: intact to LT     Reflex Exam:  DTR's:    Deep tendon reflexes in the upper and lower extremities are normal bilaterally.   Toes:    The toes are downgoing bilaterally.   Clonus:    Clonus is absent.    Assessment/Plan:  85 y.o.  female here as requested by [name], MD for morning headaches/migraines. Daughter here and provides much information.  Past medical history anxiety, coronary artery disease, colon cancer, constipation, claudication right limb of aortic stent graft, ectopic atrial tachycardia, hypertension, history of TIA, hyperlipidemia, migraines, lumbar radiculopathy, macular degeneration, trigeminal neuralgia, type 2 diabetes. Complicated PMHx also non compliance. Intractable headaches.   - My colleague recommended sleep apnea test years ago, I completely agree she should have had it. Explained sequelae of untreated sleep apnea, she still declines. She does agree to overnight O2 testing. Send home o2 testing  - MRI of the brain [place]: MRI brain due to concerning symptoms of morning headaches, positional headaches, intractable headaches to look for space occupying mass, chiari or intracranial hypertension (pseudotumor), screen for mets (from colon cancer) or other etiology  - Start Ajovy. Will contact her oncologist just to ensure is ok. Gave her samples, asked her to wait until I contacted her Oncologist just in case.   Orders Placed This Encounter  Procedures   MR BRAIN W WO CONTRAST    Meds ordered this encounter  Medications   Fremanezumab-vfrm (AJOVY) 225 MG/1.5ML SOAJ    Sig: Inject 225 mg into the skin every 30 (thirty) days.    Dispense:  1.5 mL    Refill:  3     Cc: [name], MD,  Glenda Chroman, MD  Sarina Ill, MD  [facility] [address] [place], [place] [address]  Phone [phone] Fax [phone]

## 2021-10-19 NOTE — Patient Instructions (Signed)
Ajovy  Fremanezumab injection What is this medication? FREMANEZUMAB (fre ma NEZ ue mab) is used to prevent migraine headaches. This medicine may be used for other purposes; ask your health care provider or pharmacist if you have questions. COMMON BRAND NAME(S): AJOVY What should I tell my care team before I take this medication? They need to know if you have any of these conditions: an unusual or allergic reaction to fremanezumab, other medicines, foods, dyes, or preservatives pregnant or trying to get pregnant breast-feeding How should I use this medication? This medicine is for injection under the skin. You will be taught how to prepare and give this medicine. Use exactly as directed. Take your medicine at regular intervals. Do not take your medicine more often than directed. It is important that you put your used needles and syringes in a special sharps container. Do not put them in a trash can. If you do not have a sharps container, call your pharmacist or healthcare provider to get one. Talk to your pediatrician regarding the use of this medicine in children. Special care may be needed. Overdosage: If you think you have taken too much of this medicine contact a poison control center or emergency room at once. NOTE: This medicine is only for you. Do not share this medicine with others. What if I miss a dose? If you miss a dose, take it as soon as you can. If it is almost time for your next dose, take only that dose. Do not take double or extra doses. What may interact with this medication? Interactions are not expected. This list may not describe all possible interactions. Give your health care provider a list of all the medicines, herbs, non-prescription drugs, or dietary supplements you use. Also tell them if you smoke, drink alcohol, or use illegal drugs. Some items may interact with your medicine. What should I watch for while using this medication? Tell your doctor or healthcare  professional if your symptoms do not start to get better or if they get worse. What side effects may I notice from receiving this medication? Side effects that you should report to your doctor or health care professional as soon as possible: allergic reactions like skin rash, itching or hives, swelling of the face, lips, or tongue Side effects that usually do not require medical attention (report these to your doctor or health care professional if they continue or are bothersome): pain, redness, or irritation at site where injected This list may not describe all possible side effects. Call your doctor for medical advice about side effects. You may report side effects to FDA at 1-800-FDA-1088. Where should I keep my medication? Keep out of the reach of children. You will be instructed on how to store this medicine. Throw away any unused medicine after the expiration date on the label. NOTE: This sheet is a summary. It may not cover all possible information. If you have questions about this medicine, talk to your doctor, pharmacist, or health care provider.  2022 Elsevier/Gold Standard (2017-07-30 00:00:00)

## 2021-10-22 ENCOUNTER — Encounter: Payer: Self-pay | Admitting: Neurology

## 2021-10-23 ENCOUNTER — Telehealth: Payer: Self-pay | Admitting: Neurology

## 2021-10-23 ENCOUNTER — Other Ambulatory Visit: Payer: Self-pay | Admitting: *Deleted

## 2021-10-23 ENCOUNTER — Telehealth: Payer: Self-pay | Admitting: *Deleted

## 2021-10-23 DIAGNOSIS — R519 Headache, unspecified: Secondary | ICD-10-CM

## 2021-10-23 NOTE — Telephone Encounter (Signed)
[facility] has confirmed receipt of the order for ONO.

## 2021-10-23 NOTE — Telephone Encounter (Signed)
Medicare/tricare no Josem Kaufmann req- order sent to [facility] to be scheduled at [facility]. They will reach out to the patient to schedule.

## 2021-10-23 NOTE — Telephone Encounter (Signed)
Dr Jaynee Eagles has ordered an overnight pulse oximetry test. I sent the order to [facility] to complete.

## 2021-10-26 ENCOUNTER — Telehealth: Payer: Self-pay | Admitting: *Deleted

## 2021-10-26 MED ORDER — AJOVY 225 MG/1.5ML ~~LOC~~ SOAJ: 225.0000 mg | SUBCUTANEOUS | 3 refills | Status: DC

## 2021-10-26 NOTE — Telephone Encounter (Signed)
°  Called pt & LVM (ok per DPR) advising that her oncologist told Dr Jaynee Eagles that he was ok with pt taking Ajovy. Advised we will send prescription to her pharmacy. PA might be needed.   Ajovy 225 mg injection q30 days sent to pharmacy.   Melvenia Beam, MD  P [facility] Calls Please order Ajovy for patient and let her know her oncologist said it was fine thank you

## 2021-10-27 DIAGNOSIS — Z20828 Contact with and (suspected) exposure to other viral communicable diseases: Secondary | ICD-10-CM | POA: Diagnosis not present

## 2021-10-30 DIAGNOSIS — K5909 Other constipation: Secondary | ICD-10-CM | POA: Diagnosis not present

## 2021-10-30 DIAGNOSIS — J069 Acute upper respiratory infection, unspecified: Secondary | ICD-10-CM | POA: Diagnosis not present

## 2021-10-30 DIAGNOSIS — R109 Unspecified abdominal pain: Secondary | ICD-10-CM | POA: Diagnosis not present

## 2021-10-30 DIAGNOSIS — Z6823 Body mass index (BMI) 23.0-23.9, adult: Secondary | ICD-10-CM | POA: Diagnosis not present

## 2021-10-30 DIAGNOSIS — Z299 Encounter for prophylactic measures, unspecified: Secondary | ICD-10-CM | POA: Diagnosis not present

## 2021-11-01 ENCOUNTER — Ambulatory Visit (HOSPITAL_COMMUNITY): Payer: Medicare Other

## 2021-11-16 ENCOUNTER — Ambulatory Visit (HOSPITAL_COMMUNITY)
Admission: RE | Admit: 2021-11-16 | Discharge: 2021-11-16 | Disposition: A | Discharge status: Home / Self Care | Payer: Medicare Other | Source: Ambulatory Visit | Attending: Hematology | Admitting: Hematology

## 2021-11-16 ENCOUNTER — Other Ambulatory Visit: Payer: Self-pay

## 2021-11-16 ENCOUNTER — Inpatient Hospital Stay (HOSPITAL_COMMUNITY): Payer: Medicare Other | Attending: Hematology

## 2021-11-16 DIAGNOSIS — C189 Malignant neoplasm of colon, unspecified: Secondary | ICD-10-CM | POA: Diagnosis not present

## 2021-11-16 DIAGNOSIS — M47816 Spondylosis without myelopathy or radiculopathy, lumbar region: Secondary | ICD-10-CM | POA: Diagnosis not present

## 2021-11-16 DIAGNOSIS — C182 Malignant neoplasm of ascending colon: Secondary | ICD-10-CM | POA: Diagnosis not present

## 2021-11-16 DIAGNOSIS — N281 Cyst of kidney, acquired: Secondary | ICD-10-CM | POA: Diagnosis not present

## 2021-11-16 DIAGNOSIS — K5730 Diverticulosis of large intestine without perforation or abscess without bleeding: Secondary | ICD-10-CM | POA: Diagnosis not present

## 2021-11-16 LAB — CBC WITH DIFFERENTIAL/PLATELET
Abs Immature Granulocytes: 0.02 10*3/uL (ref 0.00–0.07)
Basophils Absolute: 0 10*3/uL (ref 0.0–0.1)
Basophils Relative: 1 %
Eosinophils Absolute: 0.1 10*3/uL (ref 0.0–0.5)
Eosinophils Relative: 2 %
HCT: 42.4 % (ref 36.0–46.0)
Hemoglobin: 13.8 g/dL (ref 12.0–15.0)
Immature Granulocytes: 1 %
Lymphocytes Relative: 24 %
Lymphs Abs: 1 10*3/uL (ref 0.7–4.0)
MCH: 29.2 pg (ref 26.0–34.0)
MCHC: 32.5 g/dL (ref 30.0–36.0)
MCV: 89.8 fL (ref 80.0–100.0)
Monocytes Absolute: 0.4 10*3/uL (ref 0.1–1.0)
Monocytes Relative: 8 %
Neutro Abs: 2.8 10*3/uL (ref 1.7–7.7)
Neutrophils Relative %: 64 %
Platelets: 215 10*3/uL (ref 150–400)
RBC: 4.72 MIL/uL (ref 3.87–5.11)
RDW: 13.7 % (ref 11.5–15.5)
WBC: 4.3 10*3/uL (ref 4.0–10.5)
nRBC: 0 % (ref 0.0–0.2)

## 2021-11-16 LAB — IRON AND TIBC
Iron: 66 ug/dL (ref 28–170)
Saturation Ratios: 16 % (ref 10.4–31.8)
TIBC: 424 ug/dL (ref 250–450)
UIBC: 358 ug/dL

## 2021-11-16 LAB — COMPREHENSIVE METABOLIC PANEL
ALT: 30 U/L (ref 0–44)
AST: 26 U/L (ref 15–41)
Albumin: 4.7 g/dL (ref 3.5–5.0)
Alkaline Phosphatase: 94 U/L (ref 38–126)
Anion gap: 7 (ref 5–15)
BUN: 16 mg/dL (ref 8–23)
CO2: 30 mmol/L (ref 22–32)
Calcium: 10.3 mg/dL (ref 8.9–10.3)
Chloride: 99 mmol/L (ref 98–111)
Creatinine, Ser: 0.71 mg/dL (ref 0.44–1.00)
GFR, Estimated: >60 mL/min (ref >60)
Glucose, Bld: 111 mg/dL — ABNORMAL HIGH (ref 70–99)
Potassium: 4.7 mmol/L (ref 3.5–5.1)
Sodium: 136 mmol/L (ref 135–145)
Total Bilirubin: 0.9 mg/dL (ref 0.3–1.2)
Total Protein: 7.4 g/dL (ref 6.5–8.1)

## 2021-11-16 LAB — POCT I-STAT CREATININE: Creatinine, Ser: 0.8 mg/dL (ref 0.44–1.00)

## 2021-11-16 LAB — FERRITIN: Ferritin: 130 ng/mL (ref 11–307)

## 2021-11-16 MED ORDER — IOHEXOL 300 MG/ML  SOLN
100.0000 mL | Freq: Once | INTRAMUSCULAR | Status: AC | PRN
  Administered 2021-11-16: 100 mL via INTRAVENOUS

## 2021-11-17 LAB — CEA: CEA: 2.3 ng/mL (ref 0.0–4.7)

## 2021-11-22 ENCOUNTER — Other Ambulatory Visit: Payer: Self-pay

## 2021-11-22 ENCOUNTER — Inpatient Hospital Stay (HOSPITAL_BASED_OUTPATIENT_CLINIC_OR_DEPARTMENT_OTHER): Payer: Medicare Other | Admitting: Hematology

## 2021-11-22 VITALS — BP 192/83 | HR 73 | Temp 98.5°F | Resp 18 | Ht 60.24 in | Wt 127.0 lb

## 2021-11-22 DIAGNOSIS — C182 Malignant neoplasm of ascending colon: Secondary | ICD-10-CM | POA: Diagnosis not present

## 2021-11-22 DIAGNOSIS — Z818 Family history of other mental and behavioral disorders: Secondary | ICD-10-CM | POA: Insufficient documentation

## 2021-11-22 DIAGNOSIS — Z841 Family history of disorders of kidney and ureter: Secondary | ICD-10-CM | POA: Insufficient documentation

## 2021-11-22 DIAGNOSIS — Z87891 Personal history of nicotine dependence: Secondary | ICD-10-CM | POA: Insufficient documentation

## 2021-11-22 DIAGNOSIS — Z8249 Family history of ischemic heart disease and other diseases of the circulatory system: Secondary | ICD-10-CM | POA: Insufficient documentation

## 2021-11-22 DIAGNOSIS — R0602 Shortness of breath: Secondary | ICD-10-CM | POA: Insufficient documentation

## 2021-11-22 DIAGNOSIS — Z881 Allergy status to other antibiotic agents status: Secondary | ICD-10-CM | POA: Insufficient documentation

## 2021-11-22 DIAGNOSIS — Z888 Allergy status to other drugs, medicaments and biological substances status: Secondary | ICD-10-CM | POA: Diagnosis not present

## 2021-11-22 DIAGNOSIS — Z9049 Acquired absence of other specified parts of digestive tract: Secondary | ICD-10-CM | POA: Insufficient documentation

## 2021-11-22 DIAGNOSIS — D509 Iron deficiency anemia, unspecified: Secondary | ICD-10-CM | POA: Insufficient documentation

## 2021-11-22 DIAGNOSIS — Z79899 Other long term (current) drug therapy: Secondary | ICD-10-CM | POA: Diagnosis not present

## 2021-11-22 DIAGNOSIS — Z885 Allergy status to narcotic agent status: Secondary | ICD-10-CM | POA: Diagnosis not present

## 2021-11-22 DIAGNOSIS — R519 Headache, unspecified: Secondary | ICD-10-CM | POA: Diagnosis not present

## 2021-11-22 DIAGNOSIS — Z823 Family history of stroke: Secondary | ICD-10-CM | POA: Diagnosis not present

## 2021-11-22 DIAGNOSIS — Z8041 Family history of malignant neoplasm of ovary: Secondary | ICD-10-CM | POA: Diagnosis not present

## 2021-11-22 NOTE — Progress Notes (Signed)
[name], [place] [address]   CLINIC:  Medical Oncology/Hematology  PCP:  Glenda Chroman, MD [address] / [place] [address] [phone]   REASON FOR VISIT:  Follow-up for right colon cancer  PRIOR THERAPY: Right hemicolectomy on 10/28/2020  NGS Results: Loss of MLH1 and PMS2, BRAF V 600 E mutation positive, MSI--high  CURRENT THERAPY: Iron tablet BID  BRIEF ONCOLOGIC HISTORY:  Oncology History  Cancer of ascending colon ([place])  01/15/2021 Initial Diagnosis   Cancer of ascending colon ([place])   01/15/2021 Cancer Staging   Staging form: Colon and Rectum, AJCC 8th Edition - Clinical stage from 01/15/2021: Stage IIIC (cT3, cN2b, cM0) - Signed by Derek Jack, MD on 01/15/2021 Stage prefix: Initial diagnosis Microsatellite instability (MSI): Unstable high      CANCER STAGING:  Cancer Staging  Cancer of ascending colon [facility]) Staging form: Colon and Rectum, AJCC 8th Edition - Clinical stage from 01/15/2021: Stage IIIC (cT3, cN2b, cM0) - Signed by Derek Jack, MD on 01/15/2021   INTERVAL HISTORY:  [name], a 86 y.o. female, returns for routine follow-up of her right colon cancer. [name] was last seen on 08/09/2021.   Today she reports feeling good. She is not currently taking clonidine. She reports frequent headaches. She denies diarrhea and hematochezia.   REVIEW OF SYSTEMS:  Review of Systems  Constitutional:  Negative for appetite change and fatigue.  Respiratory:  Positive for shortness of breath.   Cardiovascular:  Positive for palpitations.  Gastrointestinal:  Negative for blood in stool and diarrhea.  Neurological:  Positive for headaches and numbness.  Psychiatric/Behavioral:  The patient is nervous/anxious.   All other systems reviewed and are negative.  PAST MEDICAL/SURGICAL HISTORY:  Past Medical History:  Diagnosis Date   Anemia    Anxiety    Arthritis    CAD (coronary artery disease)    Status  post CABG April 2021 - Dr. Servando Snare   Chronic constipation    Claudication [facility])    Chronic occlusion of the right limb of aortic stent graft.   Colon cancer ([place])    Diverticulosis    Left sided noted on colonoscopy 02/2006   Ectopic atrial tachycardia (HCC)    Esophageal reflux disease    Essential hypertension    Hiatal hernia    History of TIA (transient ischemic attack)    Hyperlipidemia    Lumbar radiculopathy    Macular degeneration    Migraines    Penetrating atherosclerotic ulcer of aorta (HCC)    Complex ulcerated plaque of the infrarenal abdominal aorta mild renal artery stenosis on the right side, normal left renal artery catheterization 2009, status post aortic stent graft   Renal artery stenosis ([place])    a. Right renal artery stent April 2013 - Dr. Fletcher Anon;  b. 03/2014 PTA/stenting of RRA 2/2 ISR: 6x18 Herculink stent.   Trigeminal neuralgia    Right   Type 2 diabetes mellitus ([place])    Past Surgical History:  Procedure Laterality Date   ABDOMINAL AORTAGRAM N/A 02/13/2012   Procedure: ABDOMINAL Maxcine Ham;  Surgeon: Wellington Hampshire, MD;  Location: [facility] [facility] CATH LAB;  Service: Cardiovascular;  Laterality: N/A;   ABDOMINAL AORTAGRAM N/A 03/17/2014   Procedure: ABDOMINAL Maxcine Ham;  Surgeon: Wellington Hampshire, MD;  Location: [facility] CATH LAB;  Service: Cardiovascular;  Laterality: N/A;   ABDOMINAL HYSTERECTOMY  1982   BIOPSY  09/15/2020   Procedure: BIOPSY;  Surgeon: [name], MD;  Location: [facility] ENDOS[facility];  Service: Endoscopy;;   BREAST BIOPSY Left 1990's   CAROTID ENDARTERECTOMY Left 1993   COLONOSCOPY     COLONOSCOPY WITH PROPOFOL N/A 09/15/2020   Procedure: COLONOSCOPY WITH PROPOFOL;  Surgeon: [name], MD;  [facility];  Service: Endoscopy;  Laterality: N/A;   CORONARY ARTERY BYPASS GRAFT N/A 03/02/2020   Procedure: CORONARY ARTERY BYPASS GRAFTING (CABG)x 3, LIMA TO LAD, SVG TO DIAGONAL, SVG TO RCA, WITH OPEN HARVESTING OF LEFT LOWER GREATER SAPHENOUS VEIN AND  RIGHT AND LEFT GREATER SAPHENOUS VEIN HARVESTED ENDOSCOPICALLY;  Surgeon: Grace Isaac, MD;  Location: [facility];  Service: Open Heart Surgery;  Laterality: N/A;   Endologix powerlink bifurcated     Covered stent graft   ENDOVEIN HARVEST OF GREATER SAPHENOUS VEIN Bilateral 03/02/2020   Procedure: [place] Of Greater Saphenous Vein;  Surgeon: Grace Isaac, MD;  Location: [facility];  Service: Open Heart Surgery;  Laterality: Bilateral;   LAPAROSCOPIC RIGHT HEMI COLECTOMY Right 10/28/2020   Procedure: LAPAROSCOPIC RIGHT HEMI COLECTOMY,;  Surgeon: Ileana Roup, MD;  [facility];  Service: General;  Laterality: Right;   LEFT HEART CATH AND CORONARY ANGIOGRAPHY N/A 01/28/2020   Procedure: LEFT HEART CATH AND CORONARY ANGIOGRAPHY;  Surgeon: Nelva Bush, MD;  Location: [facility] [facility] CV LAB;  Service: Cardiovascular;  Laterality: N/A;   LEFT HEART CATHETERIZATION WITH CORONARY ANGIOGRAM N/A 03/17/2014   Procedure: LEFT HEART CATHETERIZATION WITH CORONARY ANGIOGRAM;  Surgeon: Wellington Hampshire, MD;  Location: [facility] CATH LAB;  Service: Cardiovascular;  Laterality: N/A;   LUMBAR DISC SURGERY  1981   PERCUTANEOUS STENT INTERVENTION Right 03/17/2014   Procedure: PERCUTANEOUS STENT INTERVENTION;  Surgeon: Wellington Hampshire, MD;  Location: [facility] CATH LAB;  Service: Cardiovascular;  Laterality: Right;  Renal   RENAL ANGIOGRAM N/A 02/13/2012   Procedure: RENAL ANGIOGRAM;  Surgeon: Wellington Hampshire, MD;  Location: [facility] CATH LAB;  Service: Cardiovascular;  Laterality: N/A;   RENAL ANGIOGRAM N/A 03/17/2014   Procedure: RENAL ANGIOGRAM;  Surgeon: Wellington Hampshire, MD;  Location: [facility] CATH LAB;  Service: Cardiovascular;  Laterality: N/A;   SCLEROTHERAPY  09/15/2020   Procedure: SCLEROTHERAPY;  Surgeon: [name], MD;  [facility];  Service: Endoscopy;;   TEE WITHOUT CARDIOVERSION N/A 03/02/2020   Procedure: TRANSESOPHAGEAL ECHOCARDIOGRAM (TEE);  Surgeon: Grace Isaac, MD;  Location: [facility];   Service: Open Heart Surgery;  Laterality: N/A;   TONSILLECTOMY AND ADENOIDECTOMY  1947    SOCIAL HISTORY:  Social History   Socioeconomic History   Marital status: Widowed    Spouse name: Not on file   Number of children: 1   Years of education: 56   Highest education level: Not on file  Occupational History   Occupation: Retired    Comment: Scientist, clinical (histocompatibility and immunogenetics) at [employer] Use   Smoking status: Former    Packs/day: 0.20    Years: 1.00    Pack years: 0.20    Types: Cigarettes    Quit date: 11/13/1979    Years since quitting: 42.0   Smokeless tobacco: Never   Tobacco comments:    "smoked a few months"  Vaping Use   Vaping Use: Never used  Substance and Sexual Activity   Alcohol use: No    Alcohol/week: 0.0 standard drinks   Drug use: Never   Sexual activity: Not on file    Comment: Hysterectomy  Other Topics Concern   Not on file  Social History Narrative   Drinks 1/2  caf coffee 3-4 cups a day    Social Determinants of Health   Financial Resource Strain: Low Risk    Difficulty of Paying Living Expenses: Not hard at all  Food Insecurity: No Food Insecurity   Worried About Charity fundraiser in the Last Year: Never true   Arboriculturist in the Last Year: Never true  Transportation Needs: No Transportation Needs   Lack of Transportation (Medical): No   Lack of Transportation (Non-Medical): No  Physical Activity: Inactive   Days of Exercise per Week: 0 days   Minutes of Exercise per Session: 0 min  Stress: Stress Concern Present   Feeling of Stress : To some extent  Social Connections: Moderately Isolated   Frequency of Communication with Friends and Family: Three times a week   Frequency of Social Gatherings with Friends and Family: Three times a week   Attends Religious Services: More than 4 times per year   Active Member of Clubs or Organizations: No   Attends Archivist Meetings: Never   Marital Status: Widowed  Human resources officer Violence: Not At Risk    Fear of Current or Ex-Partner: No   Emotionally Abused: No   Physically Abused: No   Sexually Abused: No    FAMILY HISTORY:  Family History  Problem Relation Age of Onset   Heart failure Mother    Coronary artery disease Mother    Heart disease Mother    Hypertension Mother    Stroke Mother    Heart attack Father    Heart disease Father    Hypertension Father    Hypertension Sister    Parkinson's disease Sister    Migraines Sister    Kidney disease Sister    Stroke Sister    Stroke Sister    Hypertension Sister    Heart disease Brother    Hypertension Brother    Breast cancer Neg Hx     CURRENT MEDICATIONS:  Current Outpatient Medications  Medication Sig Dispense Refill   azelastine (ASTELIN) 0.1 % nasal spray ise ONE SPRAY IN EACH NOSTRIL EVERY DAY     prochlorperazine (COMPAZINE) 10 MG tablet Take by mouth.     acetaminophen (TYLENOL) 500 MG tablet Take 500 mg by mouth every 6 (six) hours as needed for mild pain or moderate pain. (Patient not taking: Reported on 11/22/2021)     amLODipine (NORVASC) 5 MG tablet TAKE 1 TABLET BY MOUTH AT BEDTIME 90 tablet 1   aspirin EC 81 MG tablet Take 81 mg by mouth daily. Swallow whole.     cetirizine (ZYRTEC) 10 MG tablet Take 10 mg by mouth at bedtime.     Cholecalciferol (VITAMIN D3) 1000 UNITS CAPS Take 1,000 Units by mouth daily.      clonazePAM (KLONOPIN) 0.5 MG tablet Take by mouth.     cloNIDine (CATAPRES) 0.1 MG tablet TAKE 1 TABLET BY MOUTH EVERY DAY 90 tablet 3   docusate sodium (COLACE) 100 MG capsule Take 100 mg by mouth in the morning and at bedtime.     Fremanezumab-vfrm (AJOVY) 225 MG/1.5ML SOAJ Inject 225 mg into the skin every 30 (thirty) days. 1.5 mL 3   Fremanezumab-vfrm (AJOVY) 225 MG/1.5ML SOAJ Inject 225 mg into the skin every 30 (thirty) days. 1.5 mL 3   isosorbide mononitrate (IMDUR) 30 MG 24 hr tablet TAKE 1/2 TABLET BY MOUTH EVERY DAY 45 tablet 1   Lactobacillus (REJUVAFLOR) CAPS Take by mouth.      metoprolol tartrate (LOPRESSOR)  50 MG tablet Take 1 tablet (50 mg total) by mouth 2 (two) times daily. 180 tablet 1   Multiple Vitamins-Minerals (PRESERVISION AREDS 2 PO) Take 1 tablet by mouth in the morning and at bedtime.     nitroGLYCERIN (NITROSTAT) 0.4 MG SL tablet Place 0.4 mg under the tongue every 5 (five) minutes as needed for chest pain.     rosuvastatin (CRESTOR) 20 MG tablet TAKE 1 TABLET BY MOUTH AT BEDTIME 90 tablet 3   No current facility-administered medications for this visit.    ALLERGIES:  Allergies  Allergen Reactions   Cefixime Shortness Of Breath    Patient is not familiar with this listed allergy   Conjugated Estrogens Other (See Comments)    REACTION: flushing   Morphine And Related Other (See Comments)    Morphine injection Neck pain   Codeine Other (See Comments)    REACTION: dizziness   Iohexol      Desc: pt has anxiety and an irregular heartbeat. contrast exacerbates this. pt was very uncomfortable after a 20cc bolus for an miroi. we did w/o iv 11/09 per dr. Kris Hartmann. pt will talk w/ md about this and will probably not get iv contrast in the future at her re, Onset Date: [id number] Multi CT's with contrast done at [facility] without pre meds, Heart Cath without pre med documented 2021. CT A/P with contrast without premeds 11/2020 without issues     Adenosine Other (See Comments)    Elevated heart rate when doing stress test    PHYSICAL EXAM:  Performance status (ECOG): 1 - Symptomatic but completely ambulatory  Vitals:   11/22/21 1452  BP: (!) 192/83  Pulse: 73  Resp: 18  Temp: 98.5 F (36.9 C)  SpO2: 94%   Wt Readings from Last 3 Encounters:  11/22/21 127 lb (57.6 kg)  10/19/21 125 lb 6.4 oz (56.9 kg)  10/18/21 125 lb (56.7 kg)   Physical Exam Vitals reviewed.  Constitutional:      Appearance: Normal appearance.  Cardiovascular:     Rate and Rhythm: Normal rate and regular rhythm.     Pulses: Normal pulses.     Heart sounds: Normal heart sounds.   Pulmonary:     Effort: Pulmonary effort is normal.     Breath sounds: Normal breath sounds.  Abdominal:     Palpations: Abdomen is soft. There is no hepatomegaly, splenomegaly or mass.     Tenderness: There is no abdominal tenderness.  Neurological:     General: No focal deficit present.     Mental Status: She is alert and oriented to person, place, and time.  Psychiatric:        Mood and Affect: Mood normal.        Behavior: Behavior normal.     LABORATORY DATA:  I have reviewed the labs as listed.  CBC Latest Ref Rng & Units 11/16/2021 08/02/2021 04/25/2021  WBC 4.0 - 10.5 K/uL 4.3 3.5(L) 4.5  Hemoglobin 12.0 - 15.0 g/dL 13.8 13.9 13.9  Hematocrit 36.0 - 46.0 % 42.4 42.3 42.9  Platelets 150 - 400 K/uL 215 171 166   CMP Latest Ref Rng & Units 11/16/2021 11/16/2021 08/02/2021  Glucose 70 - 99 mg/dL - 111(H) 141(H)  BUN 8 - 23 mg/dL - 16 18  Creatinine 0.44 - 1.00 mg/dL 0.80 0.71 1.00  Sodium 135 - 145 mmol/L - 136 136  Potassium 3.5 - 5.1 mmol/L - 4.7 4.2  Chloride 98 - 111 mmol/L - 99 100  CO2 22 -  32 mmol/L - 30 28  Calcium 8.9 - 10.3 mg/dL - 10.3 10.2  Total Protein 6.5 - 8.1 g/dL - 7.4 7.1  Total Bilirubin 0.3 - 1.2 mg/dL - 0.9 0.6  Alkaline Phos 38 - 126 U/L - 94 86  AST 15 - 41 U/L - 26 26  ALT 0 - 44 U/L - 30 27    DIAGNOSTIC IMAGING:  I have independently reviewed the scans and discussed with the patient. CT Abdomen Pelvis W Contrast  Result Date: 11/17/2021 CLINICAL DATA:  Colon cancer follow-up EXAM: CT ABDOMEN AND PELVIS WITH CONTRAST TECHNIQUE: Multidetector CT imaging of the abdomen and pelvis was performed using the standard protocol following bolus administration of intravenous contrast. CONTRAST:  1106m OMNIPAQUE IOHEXOL 300 MG/ML  SOLN COMPARISON:  CT abdomen and pelvis 04/25/2021 FINDINGS: Lower chest: No acute abnormality. Hepatobiliary: No focal liver abnormality is seen. No gallstones, gallbladder wall thickening, or biliary dilatation. Pancreas: Unremarkable.  No pancreatic ductal dilatation or surrounding inflammatory changes. Spleen: Normal in size without focal abnormality. Adrenals/Urinary Tract: Adrenal glands appear normal. Kidneys are mildly lobulated. Slightly decreased size of a 7 mm lesion at the posterolateral left kidney with a punctate calcification. A few renal cortical cysts visualized measuring up to 1.9 cm on the right. No hydronephrosis. Urinary bladder appears normal. Stomach/Bowel: No bowel obstruction, free air or pneumatosis. Previous right hemicolectomy surgical changes. No suspicious bowel wall thickening or edema identified. Moderate amount of retained fecal material in the colon. Mild colonic diverticulosis. Vascular/Lymphatic: Severe aortic atherosclerosis. No enlarged abdominal or pelvic lymph nodes. Reproductive: Status post hysterectomy. No adnexal masses. Other: No ascites. Musculoskeletal: Degenerative changes in the lumbar spine. No suspicious bony lesions identified. IMPRESSION: 1. No suspicious mass or lymphadenopathy identified in the abdomen or pelvis. 2. Colonic diverticulosis. Moderate amount of retained fecal material in the colon. Previous right hemicolectomy. 3. Other ancillary findings as described. Electronically Signed   By: DOfilia NeasM.D.   On: 11/17/2021 08:49     ASSESSMENT:  1.  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 - CT AP with contrast on 08/10/2020 with abnormal appearance of right colon with colocolonic intussusception with wall thickening and abnormally enlarged adjacent right mesenteric lymph nodes. - CT chest with contrast on 09/07/2020 with no evidence of metastatic disease in the chest. -Colonoscopy on 09/15/2020-large spherical, ulcerated, partially necrotic tumor in the transverse/right colon.  Scope could not be maneuvered more proximally to the tumor. - Right hemicolectomy on 10/28/2020 by Dr. WDema Severin - Pathology shows  invasive moderately to poorly differentiated carcinoma involving the mid ascending colon, spanning 10 cm, tumor invades through muscularis propria into pericolorectal tissue, margins negative.  9/39 lymph nodes involved.  Lymphovascular invasion present.  PT3PN2B. - Loss of MLH1 and PMS2. - BRAF V600 E mutation positive.  MSI-high. -She has refused adjuvant chemotherapy.   2.  Social/family history: - She is accompanied by her daughter today.  Lives by herself and is independent of ADLs and IADLs.  Worked as a hCatering manager - Maternal aunt had ovarian cancer.  No other malignancies.   PLAN:  1.  Stage IIIc (PT3PN2B) adenocarcinoma of ascending colon: - She does not report any change in bowel habits.  No bleeding per rectum or melena.  Reviewed CT AP with contrast from 11/17/2021 which did not show any evidence of recurrence.  Colonic diverticulosis. - Reviewed labs which showed normal LFTs.  CEA was 2.3. - She will need colonoscopy  repeated which we will discuss at next visit. - RTC 3 months for follow-up with repeat labs and CEA.   2.  Microcytic anemia: - Iron tablet was discontinued in June 2022. - Ferritin is 130 and hemoglobin 13.8.  No need for oral/parenteral iron therapy.   Orders placed this encounter:  No orders of the defined types were placed in this encounter.    Derek Jack, MD [place] [phone]   I, Thana Ates, am acting as a scribe for Dr. Derek Jack.  I, Derek Jack MD, have reviewed the above documentation for accuracy and completeness, and I agree with the above.

## 2021-11-22 NOTE — Patient Instructions (Addendum)
[place] at [facility] Discharge Instructions  You were seen and examined today by Dr. Delton Coombes. He reviewed your most recent labs and scans and everything looks good. Your blood pressure is elevated today at our office. Please keep follow up with your cardiologist to discuss this. Please keep follow up appointment as scheduled in 3 months.   Thank you for choosing [place] at [facility] to provide your oncology and hematology care.  To afford each patient quality time with our provider, please arrive at least 15 minutes before your scheduled appointment time.   If you have a lab appointment with the [facility] please come in thru the Main Entrance and check in at the main information desk.  You need to re-schedule your appointment should you arrive 10 or more minutes late.  We strive to give you quality time with our providers, and arriving late affects you and other patients whose appointments are after yours.  Also, if you no show three or more times for appointments you may be dismissed from the clinic at the providers discretion.     Again, thank you for choosing [facility].  Our hope is that these requests will decrease the amount of time that you wait before being seen by our physicians.       _____________________________________________________________  Should you have questions after your visit to [facility], please contact our office at [phone] and follow the prompts.  Our office hours are 8:00 a.m. and 4:30 p.m. Monday - Friday.  Please note that voicemails left after 4:00 p.m. may not be returned until the following business day.  We are closed weekends and major holidays.  You do have access to a nurse 24-7, just call the main number to the clinic [phone] and do not press any options, hold on the line and a nurse will answer the phone.    For prescription refill requests, have your pharmacy  contact our office and allow 72 hours.    Due to Covid, you will need to wear a mask upon entering the hospital. If you do not have a mask, a mask will be given to you at the Main Entrance upon arrival. For doctor visits, patients may have 1 support person age 84 or older with them. For treatment visits, patients can not have anyone with them due to social distancing guidelines and our immunocompromised population.

## 2021-11-28 ENCOUNTER — Ambulatory Visit: Payer: Medicare Other | Admitting: Cardiology

## 2021-11-29 ENCOUNTER — Ambulatory Visit (HOSPITAL_COMMUNITY): Payer: Medicare Other

## 2021-12-14 DIAGNOSIS — R0683 Snoring: Secondary | ICD-10-CM | POA: Diagnosis not present

## 2021-12-14 DIAGNOSIS — G4730 Sleep apnea, unspecified: Secondary | ICD-10-CM | POA: Diagnosis not present

## 2021-12-20 NOTE — Telephone Encounter (Signed)
Received results of the ONO study that was completed on 12/14/21. Study given to Dr Jaynee Eagles to review.

## 2021-12-20 NOTE — Telephone Encounter (Signed)
I called and relayed to pt that the ONO did not show any significant oxygen desaturations, but does not rule out OSA.  Pt vebalized understanding.

## 2022-01-09 ENCOUNTER — Encounter: Payer: Self-pay | Admitting: Cardiology

## 2022-01-09 NOTE — Telephone Encounter (Signed)
Error

## 2022-01-22 ENCOUNTER — Encounter: Payer: Self-pay | Admitting: Gastroenterology

## 2022-01-22 ENCOUNTER — Ambulatory Visit (INDEPENDENT_AMBULATORY_CARE_PROVIDER_SITE_OTHER): Payer: Medicare Other | Admitting: Neurology

## 2022-01-22 DIAGNOSIS — G43009 Migraine without aura, not intractable, without status migrainosus: Secondary | ICD-10-CM | POA: Diagnosis not present

## 2022-01-22 NOTE — Progress Notes (Unsigned)
[facility]    Provider:  Dr Jaynee Eagles Requesting Provider: Glenda Chroman, MD Primary Care Provider:  Glenda Chroman, MD  CC:  Migraines  01/22/2022: O2 monitor overnight did not show any significant oxygen desaturations, but does not rule out OSA. We started her on Ajovy but . Here for follow up. She has 10 migraine days a month and < 14 total headache days a month. She was scared to try the Ajovy, due to side effects and she can't inject it for herself.   HPI 10/19/2021:  [name] is a 86 y.o. female here as requested by Glenda Chroman, MD for morning headaches/migraines. Daughter here and provides much information.  Past medical history anxiety, coronary artery disease, colon cancer, constipation, claudication right limb of aortic stent graft, ectopic atrial tachycardia, hypertension, history of TIA, hyperlipidemia, migraines, lumbar radiculopathy, macular degeneration, trigeminal neuralgia, type 2 diabetes.  I reviewed Dr. Myles Gip notes, patient presented for follow-up mainly about concerns with recurring headaches, stating that this has been a problem for years but has been getting worse, also associated dizziness and inner ear problems, she had her ear walk cleaned out by PCP, she was told years ago that she had cluster migraines, she has been on multiple medications including metoprolol and Norvasc, clonidine in the past, labetalol and multiple other medications, coming off Imdur had no impact on her headaches.  I also reviewed neurology notes from 2017, seen for longstanding history of recurrent headaches, some photophobia, and nausea vomiting, different locations, pulsating, tried amitriptyline she had side effects, older sister had migraines, she has a constant daily headache, does not think she snores, has been followed by rheumatology in the past, had MRIs of the brain, she wakes with headaches, they can be pounding, bilateral unilateral, she denied any autonomic  symptoms, she tried diclofenac.  At that time she had ongoing headaches for 10 years, gabapentin.  It was recommended she have a sleep apnea test.  ESR and CRP in the past have been normal.  She has headaches daily, she sleeps well but she sits up and it is there, right now it is on the top of the head and it moves around. She takes tylenol, it knocks the edge off, ongoing for years. She gets zig zags in her eyes and then a head that is worse, pulsating/pounding/throbbing, some mild nausea, she sits in a chair the zig zags go away, dull, pressure, sometimes can be pulsating, sister with migraines. Doesn't really helps, nothing really helps her headache, after she eats she may feel better, Lying down makes it some better, seems to help, may worsen positionally, she averages 1-2 tylenol a day everyday. Unknown snoring. Headache is there when she wakes up, gets worse during the day. Poor historian.   Medications tried that can be used with migraine management include: Amlodipine, Benadryl, metoprolol, labetalol, ibuprofen, Zofran, prednisone, Compazine, tramadol, gabapentin, amitriptyline, triptans are contraindicated due to stroke, inflammatory labs such as ESR and CRP in the past have been normal.  Reviewed notes, labs and imaging from outside physicians, which showed:  MRI brain 2017: IMPRESSION:    This MRI of the brain without contrast shows the following: reviewed imaging and agree: 1.    Mild cortical atrophy that has progressed since 03/07/2005. The extent is typical for age. 2.    Chronic microvascular ischemic change and a chronic left occipital lacunar infarction. There has been some progression in the number foci since 2006. 3.    There  are no acute findings.    Review of Systems: Patient complains of symptoms per HPI as well as the following symptoms colon cancer, headaches. Pertinent negatives and positives per HPI. All others negative.   Social History   Socioeconomic History    Marital status: Widowed    Spouse name: Not on file   Number of children: 1   Years of education: 53   Highest education level: Not on file  Occupational History   Occupation: Retired    Comment: Scientist, clinical (histocompatibility and immunogenetics) at [employer]   Smoking status: Former    Packs/day: 0.20    Years: 1.00    Pack years: 0.20    Types: Cigarettes    Quit date: 11/13/1979    Years since quitting: 42.2   Smokeless tobacco: Never   Tobacco comments:    "smoked a few months"  Vaping Use   Vaping Use: Never used  Substance and Sexual Activity   Alcohol use: No    Alcohol/week: 0.0 standard drinks   Drug use: Never   Sexual activity: Not on file    Comment: Hysterectomy  Other Topics Concern   Not on file  Social History Narrative   Drinks 1/2 caf coffee 3-4 cups a day    Social Determinants of Health   Financial Resource Strain: Not on file  Food Insecurity: Not on file  Transportation Needs: Not on file  Physical Activity: Not on file  Stress: Not on file  Social Connections: Not on file  Intimate Partner Violence: Not on file    Family History  Problem Relation Age of Onset   Heart failure Mother    Coronary artery disease Mother    Heart disease Mother    Hypertension Mother    Stroke Mother    Heart attack Father    Heart disease Father    Hypertension Father    Hypertension Sister    Parkinson's disease Sister    Migraines Sister    Kidney disease Sister    Stroke Sister    Stroke Sister    Hypertension Sister    Heart disease Brother    Hypertension Brother    Breast cancer Neg Hx     Past Medical History:  Diagnosis Date   Anemia    Anxiety    Arthritis    CAD (coronary artery disease)    Status post CABG April 2021 - Dr. Servando Snare   Chronic constipation    Claudication [facility])    Chronic occlusion of the right limb of aortic stent graft.   Colon cancer ([place])    Diverticulosis    Left sided noted on colonoscopy 02/2006   Ectopic atrial tachycardia (HCC)     Esophageal reflux disease    Essential hypertension    Hiatal hernia    History of TIA (transient ischemic attack)    Hyperlipidemia    Lumbar radiculopathy    Macular degeneration    Migraines    Penetrating atherosclerotic ulcer of aorta (HCC)    Complex ulcerated plaque of the infrarenal abdominal aorta mild renal artery stenosis on the right side, normal left renal artery catheterization 2009, status post aortic stent graft   Renal artery stenosis ([place])    a. Right renal artery stent April 2013 - Dr. Fletcher Anon;  b. 03/2014 PTA/stenting of RRA 2/2 ISR: 6x18 Herculink stent.   Trigeminal neuralgia    Right   Type 2 diabetes mellitus [facility])     Patient Active Problem List   Diagnosis  Date Noted   Cancer of ascending colon ([place]) 01/15/2021   Body mass index (BMI) 22.0-22.9, adult 12/01/2020   Eustachian tube dysfunction 12/01/2020   Type 2 diabetes mellitus with hyperglycemia ([place]) 12/01/2020   S/P right hemicolectomy 10/28/2020   Coronary artery disease 03/02/2020   S/P CABG (coronary artery bypass graft) 03/02/2020   Intermediate stage nonexudative age-related macular degeneration of both eyes 02/16/2020   Serous detachment of retinal pigment epithelium of left eye 02/16/2020   Pseudophakia 02/16/2020   Posterior vitreous detachment of both eyes 02/16/2020   Accelerating angina (HCC) 01/28/2020   Chronic daily headache 02/07/2016   RAS (renal artery stenosis) ([place]) 03/17/2014   AAA (abdominal aortic aneurysm) 11/28/2012   Atherosclerosis of renal artery ([place]) 11/28/2012   Encounter for preventive measure 11/28/2012   Essential hypertension 11/28/2012   Peripheral blood vessel disorder ([place]) 11/28/2012   Coronary artery disease excluded 11/28/2012   Abdominal aneurysm 11/28/2012   Peripheral vascular disease ([place]) 11/28/2012   Renal artery stenosis (HCC)    Essential hypertension, benign    Claudication ([place])    Penetrating atherosclerotic ulcer of aorta (HCC)    Carotid  artery disease ([place]) 02/27/2011   Disorder of arteries and arterioles ([place]) 02/27/2011   Hypercholesterolemia 04/03/2010   Hyperlipidemia 04/03/2010   Atherosclerosis of native artery of extremity with intermittent claudication ([place]) 08/29/2009   CAD (coronary artery disease), native coronary artery 09/14/2008   Ectopic atrial tachycardia ([place]) 09/14/2008   Benign hypertensive heart disease 09/14/2008   Chronic tension-type headache 09/14/2008   Generalized anxiety disorder 09/14/2008   Tachycardia 09/14/2008    Past Surgical History:  Procedure Laterality Date   ABDOMINAL AORTAGRAM N/A 02/13/2012   Procedure: ABDOMINAL Maxcine Ham;  Surgeon: Wellington Hampshire, MD;  Location: [facility] C[facility];  Service: Cardiovascular;  Laterality: N/A;   ABDOMINAL AORTAGRAM N/A 03/17/2014   Procedure: ABDOMINAL Maxcine Ham;  Surgeon: Wellington Hampshire, MD;  Location: [facility] CATH LAB;  Service: Cardiovascular;  Laterality: N/A;   ABDOMINAL HYSTERECTOMY  1982   BIOPSY  09/15/2020   Procedure: BIOPSY;  Surgeon: [name], MD;  Location: [facility];  Service: Endoscopy;;   BREAST BIOPSY Left 1990's   CAROTID ENDARTERECTOMY Left 1993   COLONOSCOPY     COLONOSCOPY WITH PROPOFOL N/A 09/15/2020   Procedure: COLONOSCOPY WITH PROPOFOL;  Surgeon: [name], MD;  Location: [facility];  Service: Endoscopy;  Laterality: N/A;   CORONARY ARTERY BYPASS GRAFT N/A 03/02/2020   Procedure: CORONARY ARTERY BYPASS GRAFTING (CABG)x 3, LIMA TO LAD, SVG TO DIAGONAL, SVG TO RCA, WITH OPEN HARVESTING OF LEFT LOWER GREATER SAPHENOUS VEIN AND RIGHT AND LEFT GREATER SAPHENOUS VEIN HARVESTED ENDOSCOPICALLY;  Surgeon: Grace Isaac, MD;  Location: [facility];  Service: Open Heart Surgery;  Laterality: N/A;   Endologix powerlink bifurcated     Covered stent graft   ENDOVEIN HARVEST OF GREATER SAPHENOUS VEIN Bilateral 03/02/2020   Procedure: [place] Of Greater Saphenous Vein;  Surgeon: Grace Isaac, MD;  Location: [facility];   Service: Open Heart Surgery;  Laterality: Bilateral;   LAPAROSCOPIC RIGHT HEMI COLECTOMY Right 10/28/2020   Procedure: LAPAROSCOPIC RIGHT HEMI COLECTOMY,;  Surgeon: Ileana Roup, MD;  [facility];  Service: General;  Laterality: Right;   LEFT HEART CATH AND CORONARY ANGIOGRAPHY N/A 01/28/2020   Procedure: LEFT HEART CATH AND CORONARY ANGIOGRAPHY;  Surgeon: Nelva Bush, MD;  Location: [facility] CV LAB;  Service: Cardiovascular;  Laterality: N/A;   LEFT HEART CATHETERIZATION WITH CORONARY ANGIOGRAM N/A  03/17/2014   Procedure: LEFT HEART CATHETERIZATION WITH CORONARY ANGIOGRAM;  Surgeon: Wellington Hampshire, MD;  Location: [facility] C[facility] LAB;  Service: Cardiovascular;  Laterality: N/A;   LUMBAR DISC SURGERY  1981   PERCUTANEOUS STENT INTERVENTION Right 03/17/2014   Procedure: PERCUTANEOUS STENT INTERVENTION;  Surgeon: Wellington Hampshire, MD;  Location: [facility] CATH LAB;  Service: Cardiovascular;  Laterality: Right;  Renal   RENAL ANGIOGRAM N/A 02/13/2012   Procedure: RENAL ANGIOGRAM;  Surgeon: Wellington Hampshire, MD;  Location: [facility] C[facility] LAB;  Service: Cardiovascular;  Laterality: N/A;   RENAL ANGIOGRAM N/A 03/17/2014   Procedure: RENAL ANGIOGRAM;  Surgeon: Wellington Hampshire, MD;  Location: [facility] C[facility] LAB;  Service: Cardiovascular;  Laterality: N/A;   SCLEROTHERAPY  09/15/2020   Procedure: SCLEROTHERAPY;  Surgeon: [name], MD;  [facility];  Service: Endoscopy;;   TEE WITHOUT CARDIOVERSION N/A 03/02/2020   Procedure: TRANSESOPHAGEAL ECHOCARDIOGRAM (TEE);  Surgeon: Grace Isaac, MD;  Location: [facility];  Service: Open Heart Surgery;  Laterality: N/A;   TONSILLECTOMY AND ADENOIDECTOMY  1947    Current Outpatient Medications  Medication Sig Dispense Refill   acetaminophen (TYLENOL) 500 MG tablet Take 500 mg by mouth every 6 (six) hours as needed for mild pain or moderate pain. (Patient not taking: Reported on 11/22/2021)     amLODipine (NORVASC) 5 MG tablet TAKE 1 TABLET BY MOUTH AT BEDTIME  90 tablet 1   aspirin EC 81 MG tablet Take 81 mg by mouth daily. Swallow whole.     azelastine (ASTELIN) 0.1 % nasal spray ise ONE SPRAY IN EACH NOSTRIL EVERY DAY     cetirizine (ZYRTEC) 10 MG tablet Take 10 mg by mouth at bedtime.     Cholecalciferol (VITAMIN D3) 1000 UNITS CAPS Take 1,000 Units by mouth daily.      clonazePAM (KLONOPIN) 0.5 MG tablet Take by mouth.     cloNIDine (CATAPRES) 0.1 MG tablet TAKE 1 TABLET BY MOUTH EVERY DAY 90 tablet 3   docusate sodium (COLACE) 100 MG capsule Take 100 mg by mouth in the morning and at bedtime.     Fremanezumab-vfrm (AJOVY) 225 MG/1.5ML SOAJ Inject 225 mg into the skin every 30 (thirty) days. 1.5 mL 3   Fremanezumab-vfrm (AJOVY) 225 MG/1.5ML SOAJ Inject 225 mg into the skin every 30 (thirty) days. 1.5 mL 3   isosorbide mononitrate (IMDUR) 30 MG 24 hr tablet TAKE 1/2 TABLET BY MOUTH EVERY DAY 45 tablet 1   Lactobacillus (REJUVAFLOR) CAPS Take by mouth.     metoprolol tartrate (LOPRESSOR) 50 MG tablet Take 1 tablet (50 mg total) by mouth 2 (two) times daily. 180 tablet 1   Multiple Vitamins-Minerals (PRESERVISION AREDS 2 PO) Take 1 tablet by mouth in the morning and at bedtime.     nitroGLYCERIN (NITROSTAT) 0.4 MG SL tablet Place 0.4 mg under the tongue every 5 (five) minutes as needed for chest pain.     prochlorperazine (COMPAZINE) 10 MG tablet Take by mouth.     rosuvastatin (CRESTOR) 20 MG tablet TAKE 1 TABLET BY MOUTH AT BEDTIME 90 tablet 3   No current facility-administered medications for this visit.    Allergies as of 01/22/2022 - Review Complete 11/22/2021  Allergen Reaction Noted   Cefixime Shortness Of Breath 09/14/2008   Conjugated estrogens Other (See Comments) 09/14/2008   Morphine and related Other (See Comments) 09/13/2011   Codeine Other (See Comments) 09/14/2008   Iohexol  09/30/2008   Adenosine Other (See Comments) 09/13/2011    Vitals:  There were no vitals taken for this visit. Last Weight:  Wt Readings from Last 1  Encounters:  11/22/21 127 lb (57.6 kg)   Last Height:   Ht Readings from Last 1 Encounters:  11/22/21 5' 0.24" (1.53 m)     Physical exam: Exam: Gen: NAD, conversant, well nourised,  well groomed                     CV: RRR, no MRG. No Carotid Bruits. No peripheral edema, warm, nontender Eyes: Conjunctivae clear without exudates or hemorrhage  Neuro: Detailed Neurologic Exam  Speech:    Speech is normal; fluent and spontaneous with normal comprehension.  Cognition:    The patient is oriented to person, place, and time;     recent and remote memory intact;     language fluent;     normal attention, concentration,     fund of knowledge Cranial Nerves:    The pupils are equal, round, and reactive to light. The fundi are normal and spontaneous venous pulsations are present. Visual fields are full to finger confrontation. Extraocular movements are intact. Trigeminal sensation is intact and the muscles of mastication are normal. The face is symmetric. The palate elevates in the midline. Hearing intact. Voice is normal. Shoulder shrug is normal. The tongue has normal motion without fasciculations.   Coordination:    Normal finger to nose and heel to shin. Normal rapid alternating movements.   Gait:    Heel-toe and tandem gait are normal.   Motor Observation:    No asymmetry, no atrophy, and no involuntary movements noted. Tone:    Normal muscle tone.    Posture:    Posture is normal. normal erect    Strength:    Strength is V/V in the upper and lower limbs.      Sensation: intact to LT     Reflex Exam:  DTR's:    Deep tendon reflexes in the upper and lower extremities are normal bilaterally.   Toes:    The toes are downgoing bilaterally.   Clonus:    Clonus is absent.    Assessment/Plan:  86 y.o. female here as requested by [name], MD for morning headaches/migraines. Daughter here and provides much information.  Past medical history anxiety, coronary  artery disease, colon cancer, constipation, claudication right limb of aortic stent graft, ectopic atrial tachycardia, hypertension, history of TIA, hyperlipidemia, migraines, lumbar radiculopathy, macular degeneration, trigeminal neuralgia, type 2 diabetes. Complicated PMHx also non compliance. Intractable headaches.   - My colleague recommended sleep apnea test years ago, I completely agree she should have had it. Explained sequelae of untreated sleep apnea, she still declines. She does agree to overnight O2 testing. Send home o2 testing  - MRI of the brain [place]: MRI brain due to concerning symptoms of morning headaches, positional headaches, intractable headaches to look for space occupying mass, chiari or intracranial hypertension (pseudotumor), screen for mets (from colon cancer) or other etiology  - Start Ajovy. Will contact her oncologist just to ensure is ok. Gave her samples, asked her to wait until I contacted her Oncologist just in case.   No orders of the defined types were placed in this encounter.   No orders of the defined types were placed in this encounter.    Cc: Glenda Chroman, MD,  Glenda Chroman, MD  Sarina Ill, MD  [facility] [address] [place], [place] [address]  Phone [phone] Fax [phone]

## 2022-01-23 DIAGNOSIS — G43009 Migraine without aura, not intractable, without status migrainosus: Secondary | ICD-10-CM | POA: Insufficient documentation

## 2022-01-23 MED ORDER — QULIPTA 60 MG PO TABS: 60.0000 mg | ORAL_TABLET | Freq: Every day | ORAL | 11 refills | Status: DC

## 2022-01-25 ENCOUNTER — Telehealth: Payer: Self-pay | Admitting: *Deleted

## 2022-01-25 NOTE — Telephone Encounter (Signed)
[name] Key: [id number] - PA Case ID: [id number] ? PA Qulipta complete waiitng on approval  ?

## 2022-01-25 NOTE — Telephone Encounter (Signed)
Approvedtoday ?[id number];LAGTXM:[id number];Review Type:Prior Auth;Coverage Start Date:12/26/2021;Coverage End Date:07/24/2022; ? ?Will contact patient through mychart to make her aware  ?

## 2022-01-29 ENCOUNTER — Telehealth: Payer: Self-pay | Admitting: Cardiology

## 2022-01-29 NOTE — Telephone Encounter (Signed)
Patient informed and verbalized understanding of plan. 

## 2022-01-29 NOTE — Telephone Encounter (Signed)
Pt c/o medication issue: ? ?1. Name of Medication: Ajovy 225 mg ?Qulipta  ? ?2. How are you currently taking this medication (dosage and times per day)? Patient has not taken either medication yet ? ?3. Are you having a reaction (difficulty breathing--STAT)?  ? ?4. What is your medication issue? Patient wanted to know if Dr. Domenic Polite is OK with her taking those medications. She was prescribed them by her Neurologist Dr. Jaynee Eagles ?

## 2022-01-30 ENCOUNTER — Telehealth: Payer: Self-pay | Admitting: Cardiology

## 2022-01-30 ENCOUNTER — Other Ambulatory Visit: Payer: Self-pay

## 2022-01-30 MED ORDER — ROSUVASTATIN CALCIUM 20 MG PO TABS: 20.0000 mg | ORAL_TABLET | Freq: Every day | ORAL | 1 refills | Status: DC

## 2022-01-30 MED ORDER — ISOSORBIDE MONONITRATE ER 30 MG PO TB24: 15.0000 mg | ORAL_TABLET | Freq: Every day | ORAL | 1 refills | Status: DC

## 2022-01-30 MED ORDER — AMLODIPINE BESYLATE 5 MG PO TABS: 5.0000 mg | ORAL_TABLET | Freq: Every day | ORAL | 1 refills | Status: DC

## 2022-01-30 MED ORDER — METOPROLOL TARTRATE 50 MG PO TABS: 50.0000 mg | ORAL_TABLET | Freq: Two times a day (BID) | ORAL | 1 refills | Status: DC

## 2022-01-30 NOTE — Telephone Encounter (Signed)
Medications refilled and sent to pharmacy.

## 2022-01-30 NOTE — Telephone Encounter (Signed)
?*  STAT* If patient is at the pharmacy, call can be transferred to refill team. ? ? ?1. Which medications need to be refilled? (please list name of each medication and dose if known) need a new prescription, changing pharmacy for Rosuvastatin,  Metoprolol, amlodipine and  Isosorbide  ? ?2. Which pharmacy/location (including street and city if local pharmacy) is medication to be sent to? [facility] ? ?3. Do they need a 30 day or 90 day supply? 90 days and refills ? ?

## 2022-02-05 DIAGNOSIS — H43813 Vitreous degeneration, bilateral: Secondary | ICD-10-CM | POA: Diagnosis not present

## 2022-02-06 DIAGNOSIS — Z789 Other specified health status: Secondary | ICD-10-CM | POA: Diagnosis not present

## 2022-02-06 DIAGNOSIS — Z299 Encounter for prophylactic measures, unspecified: Secondary | ICD-10-CM | POA: Diagnosis not present

## 2022-02-06 DIAGNOSIS — L03019 Cellulitis of unspecified finger: Secondary | ICD-10-CM | POA: Diagnosis not present

## 2022-02-06 DIAGNOSIS — I10 Essential (primary) hypertension: Secondary | ICD-10-CM | POA: Diagnosis not present

## 2022-02-06 DIAGNOSIS — E1165 Type 2 diabetes mellitus with hyperglycemia: Secondary | ICD-10-CM | POA: Diagnosis not present

## 2022-02-06 DIAGNOSIS — F419 Anxiety disorder, unspecified: Secondary | ICD-10-CM | POA: Diagnosis not present

## 2022-02-08 DIAGNOSIS — I10 Essential (primary) hypertension: Secondary | ICD-10-CM | POA: Diagnosis not present

## 2022-02-20 ENCOUNTER — Encounter (INDEPENDENT_AMBULATORY_CARE_PROVIDER_SITE_OTHER): Payer: Self-pay | Admitting: Ophthalmology

## 2022-02-20 ENCOUNTER — Ambulatory Visit (INDEPENDENT_AMBULATORY_CARE_PROVIDER_SITE_OTHER): Payer: Medicare Other | Admitting: Ophthalmology

## 2022-02-20 DIAGNOSIS — H02056 Trichiasis without entropian left eye, unspecified eyelid: Secondary | ICD-10-CM | POA: Insufficient documentation

## 2022-02-20 DIAGNOSIS — H35722 Serous detachment of retinal pigment epithelium, left eye: Secondary | ICD-10-CM

## 2022-02-20 DIAGNOSIS — H353132 Nonexudative age-related macular degeneration, bilateral, intermediate dry stage: Secondary | ICD-10-CM | POA: Diagnosis not present

## 2022-02-20 DIAGNOSIS — H02054 Trichiasis without entropian left upper eyelid: Secondary | ICD-10-CM | POA: Diagnosis not present

## 2022-02-20 NOTE — Progress Notes (Signed)
? ? ?02/20/2022 ? ?  ? ?CHIEF COMPLAINT ?Patient presents for  ?Chief Complaint  ?Patient presents with  ? Macular Degeneration  ? ? ? ? ?HISTORY OF PRESENT ILLNESS: ?[name] is a 86 y.o. female who presents to the clinic today for:  ? ?HPI   ?1 yr fu ou oct. ?Pt stated that vision has gotten a little worse. Pt stated that when she is reading, it looks like the words are jumbled up together but is still able to read. ?Pt stated that it feels like something is in her left eye. ?Pt denies floaters and FOL. ?Pt is taking SYSTANE ? ? ? ?Last edited by Laurin Coder on 02/20/2022 11:34 AM.  ?  ? ? ?Referring physician: ?Luberta Mutter, MD ?[place] ?[place],  [place] [address] ? ?HISTORICAL INFORMATION:  ? ?Selected notes from the [facility] ?  ? ?Lab Results  ?Component Value Date  ? HGBA1C 7.3 (H) 10/20/2020  ?  ? ?CURRENT MEDICATIONS: ?No current outpatient medications on file. (Ophthalmic Drugs)  ? ?No current facility-administered medications for this visit. (Ophthalmic Drugs)  ? ?Current Outpatient Medications (Other)  ?Medication Sig  ? amLODipine (NORVASC) 5 MG tablet Take 1 tablet (5 mg total) by mouth at bedtime.  ? aspirin EC 81 MG tablet Take 81 mg by mouth daily. Swallow whole.  ? Atogepant (QULIPTA) 60 MG TABS Take 60 mg by mouth daily.  ? azelastine (ASTELIN) 0.1 % nasal spray ise ONE SPRAY IN EACH NOSTRIL EVERY DAY  ? cetirizine (ZYRTEC) 10 MG tablet Take 10 mg by mouth at bedtime.  ? Cholecalciferol (VITAMIN D3) 1000 UNITS CAPS Take 1,000 Units by mouth daily.   ? clonazePAM (KLONOPIN) 0.5 MG tablet Take by mouth.  ? cloNIDine (CATAPRES) 0.1 MG tablet TAKE 1 TABLET BY MOUTH EVERY DAY  ? docusate sodium (COLACE) 100 MG capsule Take 100 mg by mouth in the morning and at bedtime.  ? isosorbide mononitrate (IMDUR) 30 MG 24 hr tablet Take 0.5 tablets (15 mg total) by mouth daily.  ? Lactobacillus (REJUVAFLOR) CAPS Take by mouth.  ? metoprolol tartrate (LOPRESSOR) 50 MG tablet Take 1  tablet (50 mg total) by mouth 2 (two) times daily.  ? Multiple Vitamins-Minerals (PRESERVISION AREDS 2 PO) Take 1 tablet by mouth in the morning and at bedtime.  ? nitroGLYCERIN (NITROSTAT) 0.4 MG SL tablet Place 0.4 mg under the tongue every 5 (five) minutes as needed for chest pain.  ? prochlorperazine (COMPAZINE) 10 MG tablet Take by mouth.  ? rosuvastatin (CRESTOR) 20 MG tablet Take 1 tablet (20 mg total) by mouth at bedtime.  ? ?No current facility-administered medications for this visit. (Other)  ? ? ? ? ?REVIEW OF SYSTEMS: ?ROS   ?Negative for: Constitutional, Gastrointestinal, Neurological, Skin, Genitourinary, Musculoskeletal, HENT, Endocrine, Cardiovascular, Eyes, Respiratory, Psychiatric, Allergic/Imm, Heme/Lymph ?Last edited by Silvestre Moment on 02/20/2022 11:13 AM.  ?  ? ? ? ?ALLERGIES ?Allergies  ?Allergen Reactions  ? Cefixime Shortness Of Breath  ?  Patient is not familiar with this listed allergy  ? Conjugated Estrogens Other (See Comments)  ?  REACTION: flushing  ? Morphine And Related Other (See Comments)  ?  Morphine injection ?Neck pain  ? Codeine Other (See Comments)  ?  REACTION: dizziness  ? Iohexol   ?   Desc: pt has anxiety and an irregular heartbeat. contrast exacerbates this. pt was very uncomfortable after a 20cc bolus for an miroi. we did w/o iv 11/09 per dr.  stahl. pt will talk w/ md about this and will probably not get iv contrast in the future at her re, Onset Date: [id number] ?Multi CT's with contrast done at [facility] without pre meds, Heart Cath without pre med documented 2021. CT A/P with contrast without premeds 11/2020 without issues  ?  ? Adenosine Other (See Comments)  ?  Elevated heart rate when doing stress test  ? ? ?PAST MEDICAL HISTORY ?Past Medical History:  ?Diagnosis Date  ? Anemia   ? Anxiety   ? Arthritis   ? CAD (coronary artery disease)   ? Status post CABG April 2021 - Dr. Servando Snare  ? Chronic constipation   ? Claudication [facility])   ? Chronic occlusion of the right limb of aortic  stent graft.  ? Colon cancer ([place])   ? Diverticulosis   ? Left sided noted on colonoscopy 02/2006  ? Ectopic atrial tachycardia ([place])   ? Esophageal reflux disease   ? Essential hypertension   ? Hiatal hernia   ? History of TIA (transient ischemic attack)   ? Hyperlipidemia   ? Lumbar radiculopathy   ? Macular degeneration   ? Migraines   ? Penetrating atherosclerotic ulcer of aorta ([place])   ? Complex ulcerated plaque of the infrarenal abdominal aorta mild renal artery stenosis on the right side, normal left renal artery catheterization 2009, status post aortic stent graft  ? Renal artery stenosis ([place])   ? a. Right renal artery stent April 2013 - Dr. Fletcher Anon;  b. 03/2014 PTA/stenting of RRA 2/2 ISR: 6x18 Herculink stent.  ? Trigeminal neuralgia   ? Right  ? Type 2 diabetes mellitus ([place])   ? ?Past Surgical History:  ?Procedure Laterality Date  ? ABDOMINAL AORTAGRAM N/A 02/13/2012  ? Procedure: ABDOMINAL AORTAGRAM;  Surgeon: Wellington Hampshire, MD;  Location: [facility] CATH LAB;  Service: Cardiovascular;  Laterality: N/A;  ? ABDOMINAL AORTAGRAM N/A 03/17/2014  ? Procedure: ABDOMINAL AORTAGRAM;  Surgeon: Wellington Hampshire, MD;  Location: [facility] CATH LAB;  Service: Cardiovascular;  Laterality: N/A;  ? ABDOMINAL HYSTERECTOMY  1982  ? BIOPSY  09/15/2020  ? Procedure: BIOPSY;  Surgeon: [name], MD;  Location: [facility]OS[facility];  Service: Endoscopy;;  ? BREAST BIOPSY Left 1990's  ? CAROTID ENDARTERECTOMY Left 1993  ? COLONOSCOPY    ? COLONOSCOPY WITH PROPOFOL N/A 09/15/2020  ? Procedure: COLONOSCOPY WITH PROPOFOL;  Surgeon: [name], MD;  Location: [facility] ENDOSCOPY;  Service: Endoscopy;  Laterality: N/A;  ? CORONARY ARTERY BYPASS GRAFT N/A 03/02/2020  ? Procedure: CORONARY ARTERY BYPASS GRAFTING (CABG)x 3, LIMA TO LAD, SVG TO DIAGONAL, SVG TO RCA, WITH OPEN HARVESTING OF LEFT LOWER GREATER SAPHENOUS VEIN AND RIGHT AND LEFT GREATER SAPHENOUS VEIN HARVESTED ENDOSCOPICALLY;  Surgeon: Grace Isaac, MD;  Location: [facility];  Service: Open  Heart Surgery;  Laterality: N/A;  ? Endologix powerlink bifurcated    ? Covered stent graft  ? ENDOVEIN HARVEST OF GREATER SAPHENOUS VEIN Bilateral 03/02/2020  ? Procedure: [place] Of Greater Saphenous Vein;  Surgeon: Grace Isaac, MD;  Location: [facility];  Service: Open Heart Surgery;  Laterality: Bilateral;  ? LAPAROSCOPIC RIGHT HEMI COLECTOMY Right 10/28/2020  ? Procedure: LAPAROSCOPIC RIGHT HEMI COLECTOMY,;  Surgeon: Ileana Roup, MD;  [facility]: [facility] ORS;  Service: General;  Laterality: Right;  ? LEFT HEART CATH AND CORONARY ANGIOGRAPHY N/A 01/28/2020  ? Procedure: LEFT HEART CATH AND CORONARY ANGIOGRAPHY;  Surgeon: Nelva Bush, MD;  Location: [facility] CV LAB;  Service: Cardiovascular;  Laterality: N/A;  ? LEFT HEART CATHETERIZATION WITH CORONARY ANGIOGRAM N/A 03/17/2014  ? Procedure: LEFT HEART CATHETERIZATION WITH CORONARY ANGIOGRAM;  Surgeon: Wellington Hampshire, MD;  Location: [facility] CATH LAB;  Service: Cardiovascular;  Laterality: N/A;  ? [place]  ? PERCUTANEOUS STENT INTERVENTION Right 03/17/2014  ? Procedure: PERCUTANEOUS STENT INTERVENTION;  Surgeon: Wellington Hampshire, MD;  Location: [facility] CATH LAB;  Service: Cardiovascular;  Laterality: Right;  Renal  ? RENAL ANGIOGRAM N/A 02/13/2012  ? Procedure: RENAL ANGIOGRAM;  Surgeon: Wellington Hampshire, MD;  Location: [facility] CATH LAB;  Service: Cardiovascular;  Laterality: N/A;  ? RENAL ANGIOGRAM N/A 03/17/2014  ? Procedure: RENAL ANGIOGRAM;  Surgeon: Wellington Hampshire, MD;  Location: [facility] CATH LAB;  Service: Cardiovascular;  Laterality: N/A;  ? SCLEROTHERAPY  09/15/2020  ? Procedure: SCLEROTHERAPY;  Surgeon: [name], MD;  Location: [facility];  Service: Endoscopy;;  ? TEE WITHOUT CARDIOVERSION N/A 03/02/2020  ? Procedure: TRANSESOPHAGEAL ECHOCARDIOGRAM (TEE);  Surgeon: Grace Isaac, MD;  Location: [facility];  Service: Open Heart Surgery;  Laterality: N/A;  ? TONSILLECTOMY AND ADENOIDECTOMY  1947  ? ? ?FAMILY HISTORY ?Family History   ?Problem Relation Age of Onset  ? Heart failure Mother   ? Coronary artery disease Mother   ? Heart disease Mother   ? Hypertension Mother   ? Stroke Mother   ? Heart attack Father   ? Heart disease Father   ? H

## 2022-02-20 NOTE — Assessment & Plan Note (Signed)
Solitary lash with epilation completed today ?

## 2022-02-20 NOTE — Assessment & Plan Note (Signed)
Stable no sign of CNVM ?

## 2022-02-27 ENCOUNTER — Inpatient Hospital Stay (HOSPITAL_COMMUNITY): Payer: Medicare Other | Attending: Hematology

## 2022-02-27 DIAGNOSIS — Z79899 Other long term (current) drug therapy: Secondary | ICD-10-CM | POA: Insufficient documentation

## 2022-02-27 DIAGNOSIS — Z841 Family history of disorders of kidney and ureter: Secondary | ICD-10-CM | POA: Insufficient documentation

## 2022-02-27 DIAGNOSIS — D649 Anemia, unspecified: Secondary | ICD-10-CM | POA: Diagnosis not present

## 2022-02-27 DIAGNOSIS — R519 Headache, unspecified: Secondary | ICD-10-CM | POA: Insufficient documentation

## 2022-02-27 DIAGNOSIS — Z87891 Personal history of nicotine dependence: Secondary | ICD-10-CM | POA: Insufficient documentation

## 2022-02-27 DIAGNOSIS — Z888 Allergy status to other drugs, medicaments and biological substances status: Secondary | ICD-10-CM | POA: Insufficient documentation

## 2022-02-27 DIAGNOSIS — Z885 Allergy status to narcotic agent status: Secondary | ICD-10-CM | POA: Insufficient documentation

## 2022-02-27 DIAGNOSIS — C182 Malignant neoplasm of ascending colon: Secondary | ICD-10-CM | POA: Insufficient documentation

## 2022-02-27 DIAGNOSIS — Z881 Allergy status to other antibiotic agents status: Secondary | ICD-10-CM | POA: Diagnosis not present

## 2022-02-27 DIAGNOSIS — R42 Dizziness and giddiness: Secondary | ICD-10-CM | POA: Diagnosis not present

## 2022-02-27 DIAGNOSIS — Z8673 Personal history of transient ischemic attack (TIA), and cerebral infarction without residual deficits: Secondary | ICD-10-CM | POA: Insufficient documentation

## 2022-02-27 DIAGNOSIS — R200 Anesthesia of skin: Secondary | ICD-10-CM | POA: Insufficient documentation

## 2022-02-27 DIAGNOSIS — Z8249 Family history of ischemic heart disease and other diseases of the circulatory system: Secondary | ICD-10-CM | POA: Insufficient documentation

## 2022-02-27 DIAGNOSIS — R0602 Shortness of breath: Secondary | ICD-10-CM | POA: Insufficient documentation

## 2022-02-27 DIAGNOSIS — I10 Essential (primary) hypertension: Secondary | ICD-10-CM | POA: Diagnosis not present

## 2022-02-27 DIAGNOSIS — Z823 Family history of stroke: Secondary | ICD-10-CM | POA: Diagnosis not present

## 2022-02-27 DIAGNOSIS — Z818 Family history of other mental and behavioral disorders: Secondary | ICD-10-CM | POA: Insufficient documentation

## 2022-02-27 DIAGNOSIS — I2510 Atherosclerotic heart disease of native coronary artery without angina pectoris: Secondary | ICD-10-CM | POA: Insufficient documentation

## 2022-02-27 DIAGNOSIS — Z9049 Acquired absence of other specified parts of digestive tract: Secondary | ICD-10-CM | POA: Diagnosis not present

## 2022-02-27 LAB — CBC WITH DIFFERENTIAL/PLATELET
Abs Immature Granulocytes: 0.02 10*3/uL (ref 0.00–0.07)
Basophils Absolute: 0 10*3/uL (ref 0.0–0.1)
Basophils Relative: 0 %
Eosinophils Absolute: 0.1 10*3/uL (ref 0.0–0.5)
Eosinophils Relative: 3 %
HCT: 39.4 % (ref 36.0–46.0)
Hemoglobin: 12.9 g/dL (ref 12.0–15.0)
Immature Granulocytes: 1 %
Lymphocytes Relative: 29 %
Lymphs Abs: 0.9 10*3/uL (ref 0.7–4.0)
MCH: 28.7 pg (ref 26.0–34.0)
MCHC: 32.7 g/dL (ref 30.0–36.0)
MCV: 87.8 fL (ref 80.0–100.0)
Monocytes Absolute: 0.3 10*3/uL (ref 0.1–1.0)
Monocytes Relative: 8 %
Neutro Abs: 1.8 10*3/uL (ref 1.7–7.7)
Neutrophils Relative %: 59 %
Platelets: 180 10*3/uL (ref 150–400)
RBC: 4.49 MIL/uL (ref 3.87–5.11)
RDW: 14.2 % (ref 11.5–15.5)
WBC: 3 10*3/uL — ABNORMAL LOW (ref 4.0–10.5)
nRBC: 0 % (ref 0.0–0.2)

## 2022-02-27 LAB — COMPREHENSIVE METABOLIC PANEL
ALT: 31 U/L (ref 0–44)
AST: 27 U/L (ref 15–41)
Albumin: 4.4 g/dL (ref 3.5–5.0)
Alkaline Phosphatase: 89 U/L (ref 38–126)
Anion gap: 7 (ref 5–15)
BUN: 22 mg/dL (ref 8–23)
CO2: 26 mmol/L (ref 22–32)
Calcium: 10.1 mg/dL (ref 8.9–10.3)
Chloride: 102 mmol/L (ref 98–111)
Creatinine, Ser: 0.66 mg/dL (ref 0.44–1.00)
GFR, Estimated: >60 mL/min (ref >60)
Glucose, Bld: 146 mg/dL — ABNORMAL HIGH (ref 70–99)
Potassium: 4 mmol/L (ref 3.5–5.1)
Sodium: 135 mmol/L (ref 135–145)
Total Bilirubin: 0.7 mg/dL (ref 0.3–1.2)
Total Protein: 7 g/dL (ref 6.5–8.1)

## 2022-02-28 LAB — CEA: CEA: 1.6 ng/mL (ref 0.0–4.7)

## 2022-03-06 ENCOUNTER — Inpatient Hospital Stay (HOSPITAL_BASED_OUTPATIENT_CLINIC_OR_DEPARTMENT_OTHER): Payer: Medicare Other | Admitting: Hematology

## 2022-03-06 VITALS — BP 197/88 | HR 67 | Temp 98.1°F | Resp 16 | Ht 60.24 in | Wt 126.0 lb

## 2022-03-06 DIAGNOSIS — D649 Anemia, unspecified: Secondary | ICD-10-CM | POA: Diagnosis not present

## 2022-03-06 DIAGNOSIS — R519 Headache, unspecified: Secondary | ICD-10-CM | POA: Diagnosis not present

## 2022-03-06 DIAGNOSIS — R0602 Shortness of breath: Secondary | ICD-10-CM | POA: Diagnosis not present

## 2022-03-06 DIAGNOSIS — C182 Malignant neoplasm of ascending colon: Secondary | ICD-10-CM

## 2022-03-06 DIAGNOSIS — R42 Dizziness and giddiness: Secondary | ICD-10-CM | POA: Diagnosis not present

## 2022-03-06 DIAGNOSIS — R200 Anesthesia of skin: Secondary | ICD-10-CM | POA: Diagnosis not present

## 2022-03-06 NOTE — Patient Instructions (Addendum)
[facility] ?Discharge Instructions ? ? ?You were seen and examined today by Dr. Katragadda. ? ?He reviewed your lab work which is normal/stable. ? ?Return as scheduled.  ? ? ?Thank you for choosing [facility] at [facility] to provide your oncology and hematology care.  To afford each patient quality time with our provider, please arrive at least 15 minutes before your scheduled appointment time.  ? ?If you have a lab appointment with the Cancer Center please come in thru the Main Entrance and check in at the main information desk. ? ?You need to re-schedule your appointment should you arrive 10 or more minutes late.  We strive to give you quality time with our providers, and arriving late affects you and other patients whose appointments are after yours.  Also, if you no show three or more times for appointments you may be dismissed from the clinic at the providers discretion.     ?Again, thank you for choosing [facility].  Our hope is that these requests will decrease the amount of time that you wait before being seen by our physicians.       ?_____________________________________________________________ ? ?Should you have questions after your visit to [facility], please contact our office at [phone] and follow the prompts.  Our office hours are 8:00 a.m. and 4:30 p.m. Monday - Friday.  Please note that voicemails left after 4:00 p.m. may not be returned until the following business day.  We are closed weekends and major holidays.  You do have access to a nurse 24-7, just call the main number to the clinic [phone] and do not press any options, hold on the line and a nurse will answer the phone.   ? ?For prescription refill requests, have your pharmacy contact our office and allow 72 hours.   ? ?Due to Covid, you will need to wear a mask upon entering the hospital. If you do not have a mask, a mask will be given to  you at the Main Entrance upon arrival. For doctor visits, patients may have 1 support person age 18 or older with them. For treatment visits, patients can not have anyone with them due to social distancing guidelines and our immunocompromised population.  ? ?   ?

## 2022-03-06 NOTE — Progress Notes (Signed)
? ?[facility] ?[address] ?[place], [place] [address] ? ? ?CLINIC:  ?Medical Oncology/Hematology ? ?PCP:  ?Glenda Chroman, MD ?[place] / [place] [address] ?[phone] ? ? ?REASON FOR VISIT:  ?Follow-up for right colon cancer ? ?PRIOR THERAPY: Right hemicolectomy on 10/28/2020 ? 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 ?CURRENT THERAPY: Iron tablet BID ? ?BRIEF ONCOLOGIC HISTORY:  ?Oncology History  ?Cancer of ascending colon ([place])  ?01/15/2021 Initial Diagnosis  ? Cancer of ascending colon ([place]) ? ?  ?01/15/2021 Cancer Staging  ? Staging form: Colon and Rectum, AJCC 8th Edition ?- Clinical stage from 01/15/2021: Stage IIIC (cT3, cN2b, cM0) - Signed by Derek Jack, MD on 01/15/2021 ?Stage prefix: Initial diagnosis ?Microsatellite instability (MSI): Unstable high ? ?  ? ? ?CANCER STAGING: ?Cancer Staging  ?Cancer of ascending colon ([place]) ?Staging form: Colon and Rectum, AJCC 8th Edition ?- Clinical stage from 01/15/2021: Stage IIIC (cT3, cN2b, cM0) - Signed by Derek Jack, MD on 01/15/2021 ? ? ?INTERVAL HISTORY:  ?[name], a 86 y.o. female, returns for routine follow-up of her right colon cancer. Mattisen was last seen on 11/22/2021.  ? ?Today she reports feeling good. She denies recent bleeding, diarrhea, constipation, or change in BM. She reports chronic headaches.  ? ?REVIEW OF SYSTEMS:  ?Review of Systems  ?Constitutional:  Negative for appetite change and fatigue.  ?HENT:   Negative for nosebleeds.   ?Respiratory:  Positive for shortness of breath. Negative for hemoptysis.   ?Gastrointestinal:  Negative for blood in stool, constipation and diarrhea.  ?Genitourinary:  Negative for hematuria.   ?Neurological:  Positive for dizziness, headaches (8/10) and numbness (fingers).  ?All other systems reviewed and are negative. ? ?PAST MEDICAL/SURGICAL HISTORY:  ?Past Medical History:  ?Diagnosis Date  ? Anemia   ? Anxiety   ? Arthritis   ? CAD  (coronary artery disease)   ? Status post CABG April 2021 - Dr. Servando Snare  ? Chronic constipation   ? Claudication [facility])   ? Chronic occlusion of the right limb of aortic stent graft.  ? Colon cancer ([place])   ? Diverticulosis   ? Left sided noted on colonoscopy 02/2006  ? Ectopic atrial tachycardia ([place])   ? Esophageal reflux disease   ? Essential hypertension   ? Hiatal hernia   ? History of TIA (transient ischemic attack)   ? Hyperlipidemia   ? Lumbar radiculopathy   ? Macular degeneration   ? Migraines   ? Penetrating atherosclerotic ulcer of aorta ([place])   ? Complex ulcerated plaque of the infrarenal abdominal aorta mild renal artery stenosis on the right side, normal left renal artery catheterization 2009, status post aortic stent graft  ? Renal artery stenosis ([place])   ? a. Right renal artery stent April 2013 - Dr. Fletcher Anon;  b. 03/2014 PTA/stenting of RRA 2/2 ISR: 6x18 Herculink stent.  ? Trigeminal neuralgia   ? Right  ? Type 2 diabetes mellitus ([place])   ? ?Past Surgical History:  ?Procedure Laterality Date  ? ABDOMINAL AORTAGRAM N/A 02/13/2012  ? Procedure: ABDOMINAL AORTAGRAM;  Surgeon: Wellington Hampshire, MD;  Location: [facility] CATH LAB;  Service: Cardiovascular;  Laterality: N/A;  ? ABDOMINAL AORTAGRAM N/A 03/17/2014  ? Procedure: ABDOMINAL AORTAGRAM;  Surgeon: Wellington Hampshire, MD;  Location: [facility] CATH LAB;  Service: Cardiovascular;  Laterality: N/A;  ? ABDOMINAL HYSTERECTOMY  1982  ? BIOPSY  09/15/2020  ? Procedure: BIOPSY;  Surgeon: [name], MD;  Location: [facility];  Service: Endoscopy;;  ? BREAST BIOPSY Left 1990's  ? CAROTID ENDARTERECTOMY Left 1993  ? COLONOSCOPY    ? COLONOSCOPY WITH PROPOFOL N/A 09/15/2020  ? Procedure: COLONOSCOPY WITH PROPOFOL;  Surgeon: [name], MD;  [facility];  Service: Endoscopy;  Laterality: N/A;  ? CORONARY ARTERY BYPASS GRAFT N/A 03/02/2020  ? Procedure: CORONARY ARTERY BYPASS GRAFTING (CABG)x 3, LIMA TO LAD, SVG TO DIAGONAL, SVG TO RCA, WITH OPEN HARVESTING OF  LEFT LOWER GREATER SAPHENOUS VEIN AND RIGHT AND LEFT GREATER SAPHENOUS VEIN HARVESTED ENDOSCOPICALLY;  Surgeon: Grace Isaac, MD;  Location: [facility];  Service: Open Heart Surgery;  Laterality: N/A;  ? Endologix powerlink bifurcated    ? Covered stent graft  ? ENDOVEIN HARVEST OF GREATER SAPHENOUS VEIN Bilateral 03/02/2020  ? Procedure: [place] Of Greater Saphenous Vein;  Surgeon: Grace Isaac, MD;  Location: [facility];  Service: Open Heart Surgery;  Laterality: Bilateral;  ? LAPAROSCOPIC RIGHT HEMI COLECTOMY Right 10/28/2020  ? Procedure: LAPAROSCOPIC RIGHT HEMI COLECTOMY,;  Surgeon: Ileana Roup, MD;  [facility];  Service: General;  Laterality: Right;  ? LEFT HEART CATH AND CORONARY ANGIOGRAPHY N/A 01/28/2020  ? Procedure: LEFT HEART CATH AND CORONARY ANGIOGRAPHY;  Surgeon: Nelva Bush, MD;  Location: [facility] CV LAB;  Service: Cardiovascular;  Laterality: N/A;  ? LEFT HEART CATHETERIZATION WITH CORONARY ANGIOGRAM N/A 03/17/2014  ? Procedure: LEFT HEART CATHETERIZATION WITH CORONARY ANGIOGRAM;  Surgeon: Wellington Hampshire, MD;  Location: [facility] CATH LAB;  Service: Cardiovascular;  Laterality: N/A;  ? [place]  ? PERCUTANEOUS STENT INTERVENTION Right 03/17/2014  ? Procedure: PERCUTANEOUS STENT INTERVENTION;  Surgeon: Wellington Hampshire, MD;  Location: [facility] CATH LAB;  Service: Cardiovascular;  Laterality: Right;  Renal  ? RENAL ANGIOGRAM N/A 02/13/2012  ? Procedure: RENAL ANGIOGRAM;  Surgeon: Wellington Hampshire, MD;  Location: [facility] CATH LAB;  Service: Cardiovascular;  Laterality: N/A;  ? RENAL ANGIOGRAM N/A 03/17/2014  ? Procedure: RENAL ANGIOGRAM;  Surgeon: Wellington Hampshire, MD;  Location: [facility] CATH LAB;  Service: Cardiovascular;  Laterality: N/A;  ? SCLEROTHERAPY  09/15/2020  ? Procedure: SCLEROTHERAPY;  Surgeon: [name], MD;  Location: [facility];  Service: Endoscopy;;  ? TEE WITHOUT CARDIOVERSION N/A 03/02/2020  ? Procedure: TRANSESOPHAGEAL ECHOCARDIOGRAM (TEE);  Surgeon:  Grace Isaac, MD;  Location: [facility];  Service: Open Heart Surgery;  Laterality: N/A;  ? TONSILLECTOMY AND ADENOIDECTOMY  1947  ? ? ?SOCIAL HISTORY:  ?Social History  ? ?Socioeconomic History  ? Marital status: Widowed  ?  Spouse name: Not on file  ? Number of children: 1  ? Years of education: 73  ? Highest education level: Not on file  ?Occupational History  ? Occupation: Retired  ?  Comment: Clerk at [employer]  ?Tobacco Use  ? Smoking status: Former  ?  Packs/day: 0.20  ?  Years: 1.00  ?  Pack years: 0.20  ?  Types: Cigarettes  ?  Quit date: 11/13/1979  ?  Years since quitting: 42.3  ? Smokeless tobacco: Never  ? Tobacco comments:  ?  "smoked a few months"  ?Vaping Use  ? Vaping Use: Never used  ?Substance and Sexual Activity  ? Alcohol use: No  ?  Alcohol/week: 0.0 standard drinks  ? Drug use: Never  ? Sexual activity: Not on file  ?  Comment: Hysterectomy  ?Other Topics Concern  ? Not on file  ?Social  History Narrative  ? Drinks 1/2 caf coffee 3-4 cups a day   ? ?Social Determinants of Health  ? ?Financial Resource Strain: Not on file  ?Food Insecurity: Not on file  ?Transportation Needs: Not on file  ?Physical Activity: Not on file  ?Stress: Not on file  ?Social Connections: Not on file  ?Intimate Partner Violence: Not on file  ? ? ?FAMILY HISTORY:  ?Family History  ?Problem Relation Age of Onset  ? Heart failure Mother   ? Coronary artery disease Mother   ? Heart disease Mother   ? Hypertension Mother   ? Stroke Mother   ? Heart attack Father   ? Heart disease Father   ? Hypertension Father   ? Hypertension Sister   ? Parkinson's disease Sister   ? Migraines Sister   ? Kidney disease Sister   ? Stroke Sister   ? Stroke Sister   ? Hypertension Sister   ? Heart disease Brother   ? Hypertension Brother   ? Breast cancer Neg Hx   ? ? ?CURRENT MEDICATIONS:  ?Current Outpatient Medications  ?Medication Sig Dispense Refill  ? amLODipine (NORVASC) 5 MG tablet Take 1 tablet (5 mg total) by mouth at bedtime. 90  tablet 1  ? aspirin EC 81 MG tablet Take 81 mg by mouth daily. Swallow whole.    ? Atogepant (QULIPTA) 60 MG TABS Take 60 mg by mouth daily. 30 tablet 11  ? azelastine (ASTELIN) 0.1 % nasal spray ise ONE SPRAY IN E

## 2022-03-11 DIAGNOSIS — I10 Essential (primary) hypertension: Secondary | ICD-10-CM | POA: Diagnosis not present

## 2022-04-27 ENCOUNTER — Other Ambulatory Visit: Payer: Self-pay | Admitting: Cardiology

## 2022-05-08 ENCOUNTER — Encounter: Payer: Self-pay | Admitting: Cardiology

## 2022-05-08 ENCOUNTER — Ambulatory Visit (INDEPENDENT_AMBULATORY_CARE_PROVIDER_SITE_OTHER): Payer: Medicare Other | Admitting: Cardiology

## 2022-05-08 VITALS — BP 154/82 | HR 69 | Ht 62.0 in | Wt 127.6 lb

## 2022-05-08 DIAGNOSIS — I25119 Atherosclerotic heart disease of native coronary artery with unspecified angina pectoris: Secondary | ICD-10-CM

## 2022-05-08 DIAGNOSIS — I10 Essential (primary) hypertension: Secondary | ICD-10-CM | POA: Diagnosis not present

## 2022-05-08 DIAGNOSIS — E782 Mixed hyperlipidemia: Secondary | ICD-10-CM

## 2022-05-10 DIAGNOSIS — I10 Essential (primary) hypertension: Secondary | ICD-10-CM | POA: Diagnosis not present

## 2022-05-16 DIAGNOSIS — E1165 Type 2 diabetes mellitus with hyperglycemia: Secondary | ICD-10-CM | POA: Diagnosis not present

## 2022-05-16 DIAGNOSIS — Z299 Encounter for prophylactic measures, unspecified: Secondary | ICD-10-CM | POA: Diagnosis not present

## 2022-05-16 DIAGNOSIS — I10 Essential (primary) hypertension: Secondary | ICD-10-CM | POA: Diagnosis not present

## 2022-05-16 DIAGNOSIS — F419 Anxiety disorder, unspecified: Secondary | ICD-10-CM | POA: Diagnosis not present

## 2022-05-31 ENCOUNTER — Inpatient Hospital Stay (HOSPITAL_COMMUNITY): Payer: Medicare Other | Attending: Hematology

## 2022-05-31 ENCOUNTER — Ambulatory Visit (HOSPITAL_COMMUNITY)
Admission: RE | Admit: 2022-05-31 | Discharge: 2022-05-31 | Disposition: A | Discharge status: Home / Self Care | Payer: Medicare Other | Source: Ambulatory Visit | Attending: Hematology | Admitting: Hematology

## 2022-05-31 ENCOUNTER — Other Ambulatory Visit (HOSPITAL_COMMUNITY): Payer: Medicare Other

## 2022-05-31 DIAGNOSIS — I700 Atherosclerosis of aorta: Secondary | ICD-10-CM | POA: Diagnosis not present

## 2022-05-31 DIAGNOSIS — C182 Malignant neoplasm of ascending colon: Secondary | ICD-10-CM | POA: Diagnosis not present

## 2022-05-31 DIAGNOSIS — N2889 Other specified disorders of kidney and ureter: Secondary | ICD-10-CM | POA: Diagnosis not present

## 2022-05-31 DIAGNOSIS — K5730 Diverticulosis of large intestine without perforation or abscess without bleeding: Secondary | ICD-10-CM | POA: Diagnosis not present

## 2022-05-31 DIAGNOSIS — Z85038 Personal history of other malignant neoplasm of large intestine: Secondary | ICD-10-CM | POA: Diagnosis not present

## 2022-05-31 LAB — POCT I-STAT CREATININE: Creatinine, Ser: 1 mg/dL (ref 0.44–1.00)

## 2022-05-31 MED ORDER — IOHEXOL 300 MG/ML  SOLN
75.0000 mL | Freq: Once | INTRAMUSCULAR | Status: AC | PRN
  Administered 2022-05-31: 75 mL via INTRAVENOUS

## 2022-06-04 DIAGNOSIS — Z1339 Encounter for screening examination for other mental health and behavioral disorders: Secondary | ICD-10-CM | POA: Diagnosis not present

## 2022-06-04 DIAGNOSIS — Z0000 Encounter for general adult medical examination without abnormal findings: Secondary | ICD-10-CM | POA: Diagnosis not present

## 2022-06-04 DIAGNOSIS — R5383 Other fatigue: Secondary | ICD-10-CM | POA: Diagnosis not present

## 2022-06-04 DIAGNOSIS — Z79899 Other long term (current) drug therapy: Secondary | ICD-10-CM | POA: Diagnosis not present

## 2022-06-04 DIAGNOSIS — Z6824 Body mass index (BMI) 24.0-24.9, adult: Secondary | ICD-10-CM | POA: Diagnosis not present

## 2022-06-04 DIAGNOSIS — Z7189 Other specified counseling: Secondary | ICD-10-CM | POA: Diagnosis not present

## 2022-06-04 DIAGNOSIS — I10 Essential (primary) hypertension: Secondary | ICD-10-CM | POA: Diagnosis not present

## 2022-06-04 DIAGNOSIS — Z1331 Encounter for screening for depression: Secondary | ICD-10-CM | POA: Diagnosis not present

## 2022-06-04 DIAGNOSIS — E7800 Pure hypercholesterolemia, unspecified: Secondary | ICD-10-CM | POA: Diagnosis not present

## 2022-06-04 DIAGNOSIS — Z299 Encounter for prophylactic measures, unspecified: Secondary | ICD-10-CM | POA: Diagnosis not present

## 2022-06-05 DIAGNOSIS — R5383 Other fatigue: Secondary | ICD-10-CM | POA: Diagnosis not present

## 2022-06-05 DIAGNOSIS — E7800 Pure hypercholesterolemia, unspecified: Secondary | ICD-10-CM | POA: Diagnosis not present

## 2022-06-05 DIAGNOSIS — Z79899 Other long term (current) drug therapy: Secondary | ICD-10-CM | POA: Diagnosis not present

## 2022-06-07 ENCOUNTER — Inpatient Hospital Stay (HOSPITAL_COMMUNITY): Payer: Medicare Other

## 2022-06-07 ENCOUNTER — Inpatient Hospital Stay (HOSPITAL_BASED_OUTPATIENT_CLINIC_OR_DEPARTMENT_OTHER): Payer: Medicare Other | Admitting: Hematology

## 2022-06-07 VITALS — BP 209/92 | HR 70 | Temp 98.1°F | Resp 16 | Wt 126.8 lb

## 2022-06-07 DIAGNOSIS — C182 Malignant neoplasm of ascending colon: Secondary | ICD-10-CM

## 2022-06-07 DIAGNOSIS — D509 Iron deficiency anemia, unspecified: Secondary | ICD-10-CM | POA: Diagnosis not present

## 2022-06-07 LAB — COMPREHENSIVE METABOLIC PANEL
ALT: 26 U/L (ref 0–44)
AST: 27 U/L (ref 15–41)
Albumin: 4.6 g/dL (ref 3.5–5.0)
Alkaline Phosphatase: 95 U/L (ref 38–126)
Anion gap: 6 (ref 5–15)
BUN: 17 mg/dL (ref 8–23)
CO2: 25 mmol/L (ref 22–32)
Calcium: 10 mg/dL (ref 8.9–10.3)
Chloride: 103 mmol/L (ref 98–111)
Creatinine, Ser: 0.68 mg/dL (ref 0.44–1.00)
GFR, Estimated: >60 mL/min (ref >60)
Glucose, Bld: 99 mg/dL (ref 70–99)
Potassium: 4.3 mmol/L (ref 3.5–5.1)
Sodium: 134 mmol/L — ABNORMAL LOW (ref 135–145)
Total Bilirubin: 0.8 mg/dL (ref 0.3–1.2)
Total Protein: 7 g/dL (ref 6.5–8.1)

## 2022-06-07 LAB — CBC WITH DIFFERENTIAL/PLATELET
Abs Immature Granulocytes: 0.01 10*3/uL (ref 0.00–0.07)
Basophils Absolute: 0 10*3/uL (ref 0.0–0.1)
Basophils Relative: 0 %
Eosinophils Absolute: 0.1 10*3/uL (ref 0.0–0.5)
Eosinophils Relative: 2 %
HCT: 40.1 % (ref 36.0–46.0)
Hemoglobin: 13.4 g/dL (ref 12.0–15.0)
Immature Granulocytes: 0 %
Lymphocytes Relative: 32 %
Lymphs Abs: 0.9 10*3/uL (ref 0.7–4.0)
MCH: 29 pg (ref 26.0–34.0)
MCHC: 33.4 g/dL (ref 30.0–36.0)
MCV: 86.8 fL (ref 80.0–100.0)
Monocytes Absolute: 0.2 10*3/uL (ref 0.1–1.0)
Monocytes Relative: 8 %
Neutro Abs: 1.6 10*3/uL — ABNORMAL LOW (ref 1.7–7.7)
Neutrophils Relative %: 58 %
Platelets: 184 10*3/uL (ref 150–400)
RBC: 4.62 MIL/uL (ref 3.87–5.11)
RDW: 13.8 % (ref 11.5–15.5)
WBC: 2.8 10*3/uL — ABNORMAL LOW (ref 4.0–10.5)
nRBC: 0 % (ref 0.0–0.2)

## 2022-06-07 LAB — IRON AND TIBC
Iron: 98 ug/dL (ref 28–170)
Saturation Ratios: 24 % (ref 10.4–31.8)
TIBC: 414 ug/dL (ref 250–450)
UIBC: 316 ug/dL

## 2022-06-07 LAB — FERRITIN: Ferritin: 128 ng/mL (ref 11–307)

## 2022-06-07 NOTE — Progress Notes (Signed)
[place], [place] [address]   CLINIC:  Medical Oncology/Hematology  PCP:  Glenda Chroman, MD [address] / [place] [address] [phone]   REASON FOR VISIT:  Follow-up for right colon cancer  PRIOR THERAPY: Right hemicolectomy on 10/28/2020  NGS Results: Loss of MLH1 and PMS2, BRAF V 600 E mutation positive, MSI--high  CURRENT THERAPY: Iron tablet BID  BRIEF ONCOLOGIC HISTORY:  Oncology History  Cancer of ascending colon ([place])  01/15/2021 Initial Diagnosis   Cancer of ascending colon ([place])   01/15/2021 Cancer Staging   Staging form: Colon and Rectum, AJCC 8th Edition - Clinical stage from 01/15/2021: Stage IIIC (cT3, cN2b, cM0) - Signed by Derek Jack, MD on 01/15/2021 Stage prefix: Initial diagnosis Microsatellite instability (MSI): Unstable high     CANCER STAGING: Cancer Staging  Cancer of ascending colon [facility]) Staging form: Colon and Rectum, AJCC 8th Edition - Clinical stage from 01/15/2021: Stage IIIC (cT3, cN2b, cM0) - Signed by Derek Jack, MD on 01/15/2021   INTERVAL HISTORY:  [name], a 86 y.o. female, returns for routine follow-up of her right colon cancer. [name] was last seen on 03/06/2022.   Today she reports feeling good. She denies current bleeding, black stools, and n/v/d. Her appetite is good. She is not taking iron tablets.   REVIEW OF SYSTEMS:  Review of Systems  Constitutional:  Negative for appetite change and fatigue.  HENT:   Negative for nosebleeds.   Respiratory:  Positive for shortness of breath. Negative for hemoptysis.   Gastrointestinal:  Positive for constipation. Negative for diarrhea, nausea and vomiting.  Genitourinary:  Negative for hematuria and vaginal bleeding.   Neurological:  Positive for dizziness, headaches (8/10) and seizures (fingers).  Hematological:  Does not bruise/bleed easily.  Psychiatric/Behavioral:  The patient is nervous/anxious.   All other systems  reviewed and are negative.   PAST MEDICAL/SURGICAL HISTORY:  Past Medical History:  Diagnosis Date   Anemia    Anxiety    Arthritis    CAD (coronary artery disease)    Status post CABG April 2021 - Dr. Servando Snare   Chronic constipation    Claudication [facility])    Chronic occlusion of the right limb of aortic stent graft.   Colon cancer ([place])    Diverticulosis    Left sided noted on colonoscopy 02/2006   Ectopic atrial tachycardia (HCC)    Esophageal reflux disease    Essential hypertension    Hiatal hernia    History of TIA (transient ischemic attack)    Hyperlipidemia    Lumbar radiculopathy    Macular degeneration    Migraines    Penetrating atherosclerotic ulcer of aorta (HCC)    Complex ulcerated plaque of the infrarenal abdominal aorta mild renal artery stenosis on the right side, normal left renal artery catheterization 2009, status post aortic stent graft   Renal artery stenosis ([place])    a. Right renal artery stent April 2013 - Dr. Fletcher Anon;  b. 03/2014 PTA/stenting of RRA 2/2 ISR: 6x18 Herculink stent.   Trigeminal neuralgia    Right   Type 2 diabetes mellitus ([place])    Past Surgical History:  Procedure Laterality Date   ABDOMINAL AORTAGRAM N/A 02/13/2012   Procedure: ABDOMINAL Maxcine Ham;  Surgeon: Wellington Hampshire, MD;  Location: [facility] CATH LAB;  Service: Cardiovascular;  Laterality: N/A;   ABDOMINAL AORTAGRAM N/A 03/17/2014   Procedure: ABDOMINAL Maxcine Ham;  Surgeon: Wellington Hampshire, MD;  Location: [facility] CATH  LAB;  Service: Cardiovascular;  Laterality: N/A;   ABDOMINAL HYSTERECTOMY  1982   BIOPSY  09/15/2020   Procedure: BIOPSY;  Surgeon: [name], MD;  Location: [facility] ENDOSCOPY;  Service: Endoscopy;;   BREAST BIOPSY Left 1990's   CAROTID ENDARTERECTOMY Left 1993   COLONOSCOPY     COLONOSCOPY WITH PROPOFOL N/A 09/15/2020   Procedure: COLONOSCOPY WITH PROPOFOL;  Surgeon: [name], MD;  [facility]: [facility] ENDOSCOPY;  Service: Endoscopy;  Laterality: N/A;   CORONARY ARTERY  BYPASS GRAFT N/A 03/02/2020   Procedure: CORONARY ARTERY BYPASS GRAFTING (CABG)x 3, LIMA TO LAD, SVG TO DIAGONAL, SVG TO RCA, WITH OPEN HARVESTING OF LEFT LOWER GREATER SAPHENOUS VEIN AND RIGHT AND LEFT GREATER SAPHENOUS VEIN HARVESTED ENDOSCOPICALLY;  Surgeon: Grace Isaac, MD;  Location: [facility];  Service: Open Heart Surgery;  Laterality: N/A;   Endologix powerlink bifurcated     Covered stent graft   ENDOVEIN HARVEST OF GREATER SAPHENOUS VEIN Bilateral 03/02/2020   Procedure: [place] Of Greater Saphenous Vein;  Surgeon: Grace Isaac, MD;  Location: [facility];  Service: Open Heart Surgery;  Laterality: Bilateral;   LAPAROSCOPIC RIGHT HEMI COLECTOMY Right 10/28/2020   Procedure: LAPAROSCOPIC RIGHT HEMI COLECTOMY,;  Surgeon: Ileana Roup, MD;  [facility] ORS;  Service: General;  Laterality: Right;   LEFT HEART CATH AND CORONARY ANGIOGRAPHY N/A 01/28/2020   Procedure: LEFT HEART CATH AND CORONARY ANGIOGRAPHY;  Surgeon: Nelva Bush, MD;  Location: [facility] LAB;  Service: Cardiovascular;  Laterality: N/A;   LEFT HEART CATHETERIZATION WITH CORONARY ANGIOGRAM N/A 03/17/2014   Procedure: LEFT HEART CATHETERIZATION WITH CORONARY ANGIOGRAM;  Surgeon: Wellington Hampshire, MD;  Location: [facility] CATH LAB;  Service: Cardiovascular;  Laterality: N/A;   LUMBAR DISC SURGERY  1981   PERCUTANEOUS STENT INTERVENTION Right 03/17/2014   Procedure: PERCUTANEOUS STENT INTERVENTION;  Surgeon: Wellington Hampshire, MD;  Location: [facility] CATH LAB;  Service: Cardiovascular;  Laterality: Right;  Renal   RENAL ANGIOGRAM N/A 02/13/2012   Procedure: RENAL ANGIOGRAM;  Surgeon: Wellington Hampshire, MD;  Location: [facility] CATH LAB;  Service: Cardiovascular;  Laterality: N/A;   RENAL ANGIOGRAM N/A 03/17/2014   Procedure: RENAL ANGIOGRAM;  Surgeon: Wellington Hampshire, MD;  Location: [facility] CATH LAB;  Service: Cardiovascular;  Laterality: N/A;   SCLEROTHERAPY  09/15/2020   Procedure: SCLEROTHERAPY;  Surgeon: [name], MD;   [facility] ENDOS[facility];  Service: Endoscopy;;   TEE WITHOUT CARDIOVERSION N/A 03/02/2020   Procedure: TRANSESOPHAGEAL ECHOCARDIOGRAM (TEE);  Surgeon: Grace Isaac, MD;  Location: [facility];  Service: Open Heart Surgery;  Laterality: N/A;   TONSILLECTOMY AND ADENOIDECTOMY  1947    SOCIAL HISTORY:  Social History   Socioeconomic History   Marital status: Widowed    Spouse name: Not on file   Number of children: 1   Years of education: 75   Highest education level: Not on file  Occupational History   Occupation: Retired    Comment: Scientist, clinical (histocompatibility and immunogenetics) at [employer] Use   Smoking status: Former    Packs/day: 0.20    Years: 1.00    Total pack years: 0.20    Types: Cigarettes    Quit date: 11/13/1979    Years since quitting: 42.5   Smokeless tobacco: Never   Tobacco comments:    "smoked a few months"  Vaping Use   Vaping Use: Never used  Substance and Sexual Activity   Alcohol use: No    Alcohol/week: 0.0 standard drinks of alcohol  Drug use: Never   Sexual activity: Not on file    Comment: Hysterectomy  Other Topics Concern   Not on file  Social History Narrative   Drinks 1/2 caf coffee 3-4 cups a day    Social Determinants of Health   Financial Resource Strain: Low Risk  (11/22/2020)   Overall Financial Resource Strain (CARDIA)    Difficulty of Paying Living Expenses: Not hard at all  Food Insecurity: No Food Insecurity (11/22/2020)   Hunger Vital Sign    Worried About Running Out of Food in the Last Year: Never true    Ran Out of Food in the Last Year: Never true  Transportation Needs: No Transportation Needs (11/22/2020)   PRAPARE - Hydrologist (Medical): No    Lack of Transportation (Non-Medical): No  Physical Activity: Inactive (11/22/2020)   Exercise Vital Sign    Days of Exercise per Week: 0 days    Minutes of Exercise per Session: 0 min  Stress: Stress Concern Present (11/22/2020)   [place]    Feeling of Stress : To some extent  Social Connections: Moderately Isolated (11/22/2020)   Social Connection and Isolation Panel [NHANES]    Frequency of Communication with Friends and Family: Three times a week    Frequency of Social Gatherings with Friends and Family: Three times a week    Attends Religious Services: More than 4 times per year    Active Member of Clubs or Organizations: No    Attends Archivist Meetings: Never    Marital Status: Widowed  Intimate Partner Violence: Not At Risk (11/22/2020)   Humiliation, Afraid, Rape, and Kick questionnaire    Fear of Current or Ex-Partner: No    Emotionally Abused: No    Physically Abused: No    Sexually Abused: No    FAMILY HISTORY:  Family History  Problem Relation Age of Onset   Heart failure Mother    Coronary artery disease Mother    Heart disease Mother    Hypertension Mother    Stroke Mother    Heart attack Father    Heart disease Father    Hypertension Father    Hypertension Sister    Parkinson's disease Sister    Migraines Sister    Kidney disease Sister    Stroke Sister    Stroke Sister    Hypertension Sister    Heart disease Brother    Hypertension Brother    Breast cancer Neg Hx     CURRENT MEDICATIONS:  Current Outpatient Medications  Medication Sig Dispense Refill   amLODipine (NORVASC) 5 MG tablet TAKE ONE TABLET BY MOUTH AT BEDTIME 90 tablet 1   aspirin EC 81 MG tablet Take 81 mg by mouth daily. Swallow whole.     Atogepant (QULIPTA) 60 MG TABS Take 60 mg by mouth daily. 30 tablet 11   azelastine (ASTELIN) 0.1 % nasal spray as needed.     cetirizine (ZYRTEC) 10 MG tablet Take 10 mg by mouth as needed.     Cholecalciferol (VITAMIN D3) 1000 UNITS CAPS Take 1,000 Units by mouth daily.      clonazePAM (KLONOPIN) 0.5 MG tablet Take 0.25 mg by mouth at bedtime.     cloNIDine (CATAPRES) 0.1 MG tablet TAKE 1 TABLET BY MOUTH EVERY DAY (Patient taking differently:  as needed.) 90 tablet 3   docusate sodium (COLACE) 100 MG capsule Take 100 mg by mouth in the  morning and at bedtime.     isosorbide mononitrate (IMDUR) 30 MG 24 hr tablet TAKE 0.5 TABLETS BY MOUTH EVERY DAY 45 tablet 1   Lactobacillus (REJUVAFLOR) CAPS Take by mouth.     metoprolol tartrate (LOPRESSOR) 50 MG tablet TAKE ONE TABLET BY MOUTH TWICE DAILY 180 tablet 1   Multiple Vitamins-Minerals (PRESERVISION AREDS 2 PO) Take 1 tablet by mouth in the morning and at bedtime.     nitroGLYCERIN (NITROSTAT) 0.4 MG SL tablet Place 0.4 mg under the tongue every 5 (five) minutes as needed for chest pain.     Omega-3 Fatty Acids (FISH OIL PO) Take 1,400 mg by mouth daily.     rosuvastatin (CRESTOR) 20 MG tablet TAKE ONE TABLET BY MOUTH AT BEDTIME 90 tablet 1   prochlorperazine (COMPAZINE) 10 MG tablet Take by mouth. (Patient not taking: Reported on 06/07/2022)     No current facility-administered medications for this visit.    ALLERGIES:  Allergies  Allergen Reactions   Cefixime Shortness Of Breath    Patient is not familiar with this listed allergy   Conjugated Estrogens Other (See Comments)    REACTION: flushing   Morphine And Related Other (See Comments)    Morphine injection Neck pain   Codeine Other (See Comments)    REACTION: dizziness   Iohexol      Desc: pt has anxiety and an irregular heartbeat. contrast exacerbates this. pt was very uncomfortable after a 20cc bolus for an miroi. we did w/o iv 11/09 per dr. Kris Hartmann. pt will talk w/ md about this and will probably not get iv contrast in the future at her re, Onset Date: [id number] Multi CT's with contrast done at [facility] without pre meds, Heart Cath without pre med documented 2021. CT A/P with contrast without premeds 11/2020 without issues     Adenosine Other (See Comments)    Elevated heart rate when doing stress test    PHYSICAL EXAM:  Performance status (ECOG): 1 - Symptomatic but completely ambulatory  There were no vitals filed for  this visit. Wt Readings from Last 3 Encounters:  05/08/22 127 lb 9.6 oz (57.9 kg)  03/06/22 126 lb (57.2 kg)  11/22/21 127 lb (57.6 kg)   Physical Exam Vitals reviewed.  Constitutional:      Appearance: Normal appearance.  Cardiovascular:     Rate and Rhythm: Normal rate and regular rhythm.     Pulses: Normal pulses.     Heart sounds: Normal heart sounds.  Pulmonary:     Effort: Pulmonary effort is normal.     Breath sounds: Normal breath sounds.  Musculoskeletal:     Right lower leg: No edema.     Left lower leg: No edema.  Neurological:     General: No focal deficit present.     Mental Status: She is alert and oriented to person, place, and time.  Psychiatric:        Mood and Affect: Mood normal.        Behavior: Behavior normal.      LABORATORY DATA:  I have reviewed the labs as listed.     Latest Ref Rng & Units 02/27/2022    1:00 PM 11/16/2021   12:02 PM 08/02/2021    2:08 PM  CBC  WBC 4.0 - 10.5 K/uL 3.0  4.3  3.5   Hemoglobin 12.0 - 15.0 g/dL 12.9  13.8  13.9   Hematocrit 36.0 - 46.0 % 39.4  42.4  42.3   Platelets 150 -  400 K/uL 180  215  171       Latest Ref Rng & Units 05/31/2022    2:02 PM 02/27/2022    1:00 PM 11/16/2021   12:49 PM  CMP  Glucose 70 - 99 mg/dL  146    BUN 8 - 23 mg/dL  22    Creatinine 0.44 - 1.00 mg/dL 1.00  0.66  0.80   Sodium 135 - 145 mmol/L  135    Potassium 3.5 - 5.1 mmol/L  4.0    Chloride 98 - 111 mmol/L  102    CO2 22 - 32 mmol/L  26    Calcium 8.9 - 10.3 mg/dL  10.1    Total Protein 6.5 - 8.1 g/dL  7.0    Total Bilirubin 0.3 - 1.2 mg/dL  0.7    Alkaline Phos 38 - 126 U/L  89    AST 15 - 41 U/L  27    ALT 0 - 44 U/L  31      DIAGNOSTIC IMAGING:  I have independently reviewed the scans and discussed with the patient. CT Abdomen Pelvis W Contrast  Result Date: 06/01/2022 CLINICAL DATA:  History of colon cancer status post right hemicolectomy. Follow-up. * Tracking Code: BO * EXAM: CT ABDOMEN AND PELVIS WITH CONTRAST  TECHNIQUE: Multidetector CT imaging of the abdomen and pelvis was performed using the standard protocol following bolus administration of intravenous contrast. RADIATION DOSE REDUCTION: This exam was performed according to the departmental dose-optimization program which includes automated exposure control, adjustment of the mA and/or kV according to patient size and/or use of iterative reconstruction technique. CONTRAST:  100m OMNIPAQUE IOHEXOL 300 MG/ML  SOLN COMPARISON:  Multiple priors including most recent CT abdomen and pelvis November 16, 2021 FINDINGS: Lower chest: Prior median sternotomy and CABG. No acute abnormality. Hepatobiliary: No suspicious hepatic lesion. Gallbladder is unremarkable. No biliary ductal dilation. Pancreas: No pancreatic ductal dilation or evidence of acute inflammation. Spleen: No splenomegaly or focal splenic lesion. Adrenals/Urinary Tract: Bilateral adrenal glands appear normal. No hydronephrosis. Left cortical renal scarring with parenchymal calcifications. Right renal cyst is considered benign and requires no individual imaging follow-up. Urinary bladder is unremarkable for degree of distension. Stomach/Bowel: Radiopaque enteric contrast material traverses the sigmoid colon. Stomach is unremarkable for degree of distension. There is no pathologic dilation of small or large bowel. Surgical changes of prior right hemicolectomy with anastomosis in the right hemiabdomen. No suspicious nodularity along the suture line. Large volume of formed stool throughout the colon as can be seen with constipation. Vascular/Lymphatic: Aorto bi-iliac stent graft. Coronary artery calcifications. No pathologically enlarged abdominal or pelvic lymph nodes Reproductive: Status post hysterectomy. No adnexal masses. Other: No significant abdominopelvic free fluid. No discrete peritoneal or omental nodularity. Musculoskeletal: No aggressive lytic or blastic lesion of bone. Multilevel degenerative changes  spine. IMPRESSION: 1. Stable examination post right hemicolectomy without evidence of local recurrence or abdominopelvic metastatic disease. 2. Colonic diverticulosis without findings of acute diverticulitis. 3. Large volume of formed stool throughout the colon suggestive of constipation. 4.  Aortic Atherosclerosis (ICD10-I70.0). Electronically Signed   By: JDahlia BailiffM.D.   On: 06/01/2022 14:42     ASSESSMENT:  1.  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 - CT AP with contrast on 08/10/2020 with abnormal appearance of right colon with colocolonic intussusception with wall thickening and abnormally enlarged adjacent right mesenteric lymph nodes. - CT chest with contrast on 09/07/2020 with  no evidence of metastatic disease in the chest. -Colonoscopy on 09/15/2020-large spherical, ulcerated, partially necrotic tumor in the transverse/right colon.  Scope could not be maneuvered more proximally to the tumor. - Right hemicolectomy on 10/28/2020 by Dr. Dema Severin. - Pathology shows invasive moderately to poorly differentiated carcinoma involving the mid ascending colon, spanning 10 cm, tumor invades through muscularis propria into pericolorectal tissue, margins negative.  9/39 lymph nodes involved.  Lymphovascular invasion present.  PT3PN2B. - Loss of MLH1 and PMS2. - BRAF V600 E mutation positive.  MSI-high. -She has refused adjuvant chemotherapy.   2.  Social/family history: - She is accompanied by her daughter today.  Lives by herself and is independent of ADLs and IADLs.  Worked as a Catering manager. - Maternal aunt had ovarian cancer.  No other malignancies.   PLAN:  1.  Stage IIIc (PT3PN2B) adenocarcinoma of ascending colon: - She does not report any change in bowel habits or bleeding per rectum. - CTAP (05/31/2022): No evidence of metastatic disease or recurrence.  Colonic diverticulosis. - Last CEA was 1.6.  CEA from  today is pending.  LFTs are normal. - Recommend follow-up in 3 months with repeat labs including tumor marker.   2.  Microcytic anemia: - She has been off of iron tablet. - Labs today shows hemoglobin 13.4.  Ferritin is 128 and percent saturation 24.  We will plan to repeat labs in 3 months.   Orders placed this encounter:  No orders of the defined types were placed in this encounter.    Derek Jack, MD [place] [phone]   I, Thana Ates, am acting as a scribe for Dr. Derek Jack.  I, Derek Jack MD, have reviewed the above documentation for accuracy and completeness, and I agree with the above.

## 2022-06-07 NOTE — Patient Instructions (Addendum)
[facility] [facility] at [facility] Discharge Instructions   You were seen and examined today by Dr. Delton Coombes.  He reviewed the results of your CT scan which was normal.   We will check your blood work today.   We will see you back in 3 months. We will repeat lab work 1 week prior to your next visit.    Thank you for choosing [facility] at [facility] to provide your oncology and hematology care.  To afford each patient quality time with our provider, please arrive at least 15 minutes before your scheduled appointment time.   If you have a lab appointment with the [facility] please come in thru the Main Entrance and check in at the main information desk.  You need to re-schedule your appointment should you arrive 10 or more minutes late.  We strive to give you quality time with our providers, and arriving late affects you and other patients whose appointments are after yours.  Also, if you no show three or more times for appointments you may be dismissed from the clinic at the providers discretion.     Again, thank you for choosing [facility].  Our hope is that these requests will decrease the amount of time that you wait before being seen by our physicians.       _____________________________________________________________  Should you have questions after your visit to [facility], please contact our office at [phone] and follow the prompts.  Our office hours are 8:00 a.m. and 4:30 p.m. Monday - Friday.  Please note that voicemails left after 4:00 p.m. may not be returned until the following business day.  We are closed weekends and major holidays.  You do have access to a nurse 24-7, just call the main number to the clinic [phone] and do not press any options, hold on the line and a nurse will answer the phone.    For prescription refill requests, have your pharmacy contact our office and allow 72 hours.     Due to Covid, you will need to wear a mask upon entering the hospital. If you do not have a mask, a mask will be given to you at the Main Entrance upon arrival. For doctor visits, patients may have 1 support person age 44 or older with them. For treatment visits, patients can not have anyone with them due to social distancing guidelines and our immunocompromised population.

## 2022-06-08 LAB — CEA: CEA: 1.7 ng/mL (ref 0.0–4.7)

## 2022-06-11 DIAGNOSIS — I10 Essential (primary) hypertension: Secondary | ICD-10-CM | POA: Diagnosis not present

## 2022-07-11 DIAGNOSIS — I10 Essential (primary) hypertension: Secondary | ICD-10-CM | POA: Diagnosis not present

## 2022-08-10 DIAGNOSIS — I10 Essential (primary) hypertension: Secondary | ICD-10-CM | POA: Diagnosis not present

## 2022-09-05 ENCOUNTER — Inpatient Hospital Stay: Payer: Medicare Other | Attending: Hematology

## 2022-09-05 DIAGNOSIS — Z79899 Other long term (current) drug therapy: Secondary | ICD-10-CM | POA: Diagnosis not present

## 2022-09-05 DIAGNOSIS — Z87891 Personal history of nicotine dependence: Secondary | ICD-10-CM | POA: Insufficient documentation

## 2022-09-05 DIAGNOSIS — C182 Malignant neoplasm of ascending colon: Secondary | ICD-10-CM

## 2022-09-05 LAB — COMPREHENSIVE METABOLIC PANEL
ALT: 27 U/L (ref 0–44)
AST: 27 U/L (ref 15–41)
Albumin: 4.6 g/dL (ref 3.5–5.0)
Alkaline Phosphatase: 88 U/L (ref 38–126)
Anion gap: 8 (ref 5–15)
BUN: 19 mg/dL (ref 8–23)
CO2: 29 mmol/L (ref 22–32)
Calcium: 10.3 mg/dL (ref 8.9–10.3)
Chloride: 101 mmol/L (ref 98–111)
Creatinine, Ser: 0.93 mg/dL (ref 0.44–1.00)
GFR, Estimated: >60 mL/min (ref >60)
Glucose, Bld: 147 mg/dL — ABNORMAL HIGH (ref 70–99)
Potassium: 3.9 mmol/L (ref 3.5–5.1)
Sodium: 138 mmol/L (ref 135–145)
Total Bilirubin: 0.9 mg/dL (ref 0.3–1.2)
Total Protein: 7.2 g/dL (ref 6.5–8.1)

## 2022-09-05 LAB — CBC WITH DIFFERENTIAL/PLATELET
Abs Immature Granulocytes: 0.01 10*3/uL (ref 0.00–0.07)
Basophils Absolute: 0 10*3/uL (ref 0.0–0.1)
Basophils Relative: 1 %
Eosinophils Absolute: 0 10*3/uL (ref 0.0–0.5)
Eosinophils Relative: 1 %
HCT: 40.4 % (ref 36.0–46.0)
Hemoglobin: 13.4 g/dL (ref 12.0–15.0)
Immature Granulocytes: 0 %
Lymphocytes Relative: 28 %
Lymphs Abs: 0.8 10*3/uL (ref 0.7–4.0)
MCH: 28.8 pg (ref 26.0–34.0)
MCHC: 33.2 g/dL (ref 30.0–36.0)
MCV: 86.9 fL (ref 80.0–100.0)
Monocytes Absolute: 0.2 10*3/uL (ref 0.1–1.0)
Monocytes Relative: 5 %
Neutro Abs: 1.9 10*3/uL (ref 1.7–7.7)
Neutrophils Relative %: 65 %
Platelets: 176 10*3/uL (ref 150–400)
RBC: 4.65 MIL/uL (ref 3.87–5.11)
RDW: 13.8 % (ref 11.5–15.5)
WBC: 2.9 10*3/uL — ABNORMAL LOW (ref 4.0–10.5)
nRBC: 0 % (ref 0.0–0.2)

## 2022-09-06 LAB — CEA: CEA: 1.6 ng/mL (ref 0.0–4.7)

## 2022-09-10 DIAGNOSIS — I10 Essential (primary) hypertension: Secondary | ICD-10-CM | POA: Diagnosis not present

## 2022-09-11 ENCOUNTER — Inpatient Hospital Stay: Payer: Medicare Other

## 2022-09-11 ENCOUNTER — Inpatient Hospital Stay (HOSPITAL_BASED_OUTPATIENT_CLINIC_OR_DEPARTMENT_OTHER): Payer: Medicare Other | Admitting: Hematology

## 2022-09-11 VITALS — BP 225/79 | HR 74 | Temp 98.4°F | Resp 16 | Wt 126.7 lb

## 2022-09-11 DIAGNOSIS — C182 Malignant neoplasm of ascending colon: Secondary | ICD-10-CM

## 2022-09-11 DIAGNOSIS — Z79899 Other long term (current) drug therapy: Secondary | ICD-10-CM | POA: Diagnosis not present

## 2022-09-11 DIAGNOSIS — Z87891 Personal history of nicotine dependence: Secondary | ICD-10-CM | POA: Diagnosis not present

## 2022-09-11 NOTE — Patient Instructions (Addendum)
[facility]  Discharge Instructions  You were seen and examined today by Dr. Delton Coombes.  Dr. Delton Coombes discussed your most recent lab work which revealed that everything looks good and stable.  Follow-up as scheduled in 6 months with labs and scan prior.    Thank you for choosing [facility] to provide your oncology and hematology care.   To afford each patient quality time with our provider, please arrive at least 15 minutes before your scheduled appointment time. You may need to reschedule your appointment if you arrive late (10 or more minutes). Arriving late affects you and other patients whose appointments are after yours.  Also, if you miss three or more appointments without notifying the office, you may be dismissed from the clinic at the provider's discretion.    Again, thank you for choosing [facility].  Our hope is that these requests will decrease the amount of time that you wait before being seen by our physicians.   If you have a lab appointment with the [facility] please come in thru the Main Entrance and check in at the main information desk.           _____________________________________________________________  Should you have questions after your visit to [facility], please contact our office at [phone] and follow the prompts.  Our office hours are 8:00 a.m. to 4:30 p.m. Monday - Thursday and 8:00 a.m. to 2:30 p.m. Friday.  Please note that voicemails left after 4:00 p.m. may not be returned until the following business day.  We are closed weekends and all major holidays.  You do have access to a nurse 24-7, just call the main number to the clinic [phone] and do not press any options, hold on the line and a nurse will answer the phone.    For prescription refill requests, have your pharmacy contact our office and allow 72 hours.    Masks are optional in the cancer centers.  If you would like for your care team to wear a mask while they are taking care of you, please let them know. You may have one support person who is at least 84 years old accompany you for your appointments.

## 2022-09-11 NOTE — Progress Notes (Signed)
[place], [place] [address]   CLINIC:  Medical Oncology/Hematology  PCP:  Glenda Chroman, MD [address] / [place] [address] [phone]   REASON FOR VISIT:  Follow-up for right colon cancer  PRIOR THERAPY: Right hemicolectomy on 10/28/2020  NGS Results: Loss of MLH1 and PMS2, BRAF V 600 E mutation positive, MSI--high  CURRENT THERAPY: Iron tablet BID  BRIEF ONCOLOGIC HISTORY:  Oncology History  Cancer of ascending colon ([facility])  01/15/2021 Initial Diagnosis   Cancer of ascending colon ([place])   01/15/2021 Cancer Staging   Staging form: Colon and Rectum, AJCC 8th Edition - Clinical stage from 01/15/2021: Stage IIIC (cT3, cN2b, cM0) - Signed by Derek Jack, MD on 01/15/2021 Stage prefix: Initial diagnosis Microsatellite instability (MSI): Unstable high     CANCER STAGING:  Cancer Staging  Cancer of ascending colon [facility]) Staging form: Colon and Rectum, AJCC 8th Edition - Clinical stage from 01/15/2021: Stage IIIC (cT3, cN2b, cM0) - Signed by Derek Jack, MD on 01/15/2021   INTERVAL HISTORY:  [name], a 86 y.o. female, seen for follow-up of for right-sided colon cancer.  Denies any change in bowel habits.  Denies any bleeding per rectum or melena.   REVIEW OF SYSTEMS:  Review of Systems  Respiratory:  Positive for shortness of breath.   Cardiovascular:  Positive for palpitations.  Neurological:  Positive for headaches (8/10) and numbness ([place]).  Hematological:  Does not bruise/bleed easily.  All other systems reviewed and are negative.   PAST MEDICAL/SURGICAL HISTORY:  Past Medical History:  Diagnosis Date   Anemia    Anxiety    Arthritis    CAD (coronary artery disease)    Status post CABG April 2021 - Dr. Servando Snare   Chronic constipation    Claudication [facility])    Chronic occlusion of the right limb of aortic stent graft.   Colon cancer ([place])    Diverticulosis    Left sided noted on colonoscopy  02/2006   Ectopic atrial tachycardia (HCC)    Esophageal reflux disease    Essential hypertension    Hiatal hernia    History of TIA (transient ischemic attack)    Hyperlipidemia    Lumbar radiculopathy    Macular degeneration    Migraines    Penetrating atherosclerotic ulcer of aorta (H[facility])    Complex ulcerated plaque of the infrarenal abdominal aorta mild renal artery stenosis on the right side, normal left renal artery catheterization 2009, status post aortic stent graft   Renal artery stenosis ([address] [address])    a. Right renal artery stent April 2013 - Dr. Fletcher Anon;  b. 03/2014 PTA/stenting of RRA 2/2 ISR: 6x18 Herculink stent.   Trigeminal neuralgia    Right   Type 2 diabetes mellitus ([place])    Past Surgical History:  Procedure Laterality Date   ABDOMINAL AORTAGRAM N/A 02/13/2012   Procedure: ABDOMINAL Maxcine Ham;  Surgeon: Wellington Hampshire, MD;  Location: [place] CATH LAB;  Service: Cardiovascular;  Laterality: N/A;   ABDOMINAL AORTAGRAM N/A 03/17/2014   Procedure: ABDOMINAL Maxcine Ham;  Surgeon: Wellington Hampshire, MD;  Location: [place] CATH LAB;  Service: Cardiovascular;  Laterality: N/A;   ABDOMINAL HYSTERECTOMY  1982   BIOPSY  09/15/2020   Procedure: BIOPSY;  Surgeon: [name], MD;  Location: [facility] ENDOS[facility];  Service: Endoscopy;;   BREAST BIOPSY Left 1990's   CAROTID ENDARTERECTOMY Left 1993   COLONOSCOPY     COLONOSCOPY WITH PROPOFOL N/A 09/15/2020  Procedure: COLONOSCOPY WITH PROPOFOL;  Surgeon: [name], MD;  [facility]: [facility] ENDOS[facility];  Service: Endoscopy;  Laterality: N/A;   CORONARY ARTERY BYPASS GRAFT N/A 03/02/2020   Procedure: CORONARY ARTERY BYPASS GRAFTING (CABG)x 3, LIMA TO LAD, SVG TO DIAGONAL, SVG TO RCA, WITH OPEN HARVESTING OF LEFT LOWER GREATER SAPHENOUS VEIN AND RIGHT AND LEFT GREATER SAPHENOUS VEIN HARVESTED ENDOSCOPICALLY;  Surgeon: Grace Isaac, MD;  Location: [facility];  Service: Open Heart Surgery;  Laterality: N/A;   Endologix powerlink bifurcated     Covered stent graft    ENDOVEIN HARVEST OF GREATER SAPHENOUS VEIN Bilateral 03/02/2020   Procedure: [place] Of Greater Saphenous Vein;  Surgeon: Grace Isaac, MD;  Location: [place];  Service: Open Heart Surgery;  Laterality: Bilateral;   LAPAROSCOPIC RIGHT HEMI COLECTOMY Right 10/28/2020   Procedure: LAPAROSCOPIC RIGHT HEMI COLECTOMY,;  Surgeon: Ileana Roup, MD;  [facility];  Service: General;  Laterality: Right;   LEFT HEART CATH AND CORONARY ANGIOGRAPHY N/A 01/28/2020   Procedure: LEFT HEART CATH AND CORONARY ANGIOGRAPHY;  Surgeon: Nelva Bush, MD;  Location: [facility] CV LAB;  Service: Cardiovascular;  Laterality: N/A;   LEFT HEART CATHETERIZATION WITH CORONARY ANGIOGRAM N/A 03/17/2014   Procedure: LEFT HEART CATHETERIZATION WITH CORONARY ANGIOGRAM;  Surgeon: Wellington Hampshire, MD;  Location: [place] CATH LAB;  Service: Cardiovascular;  Laterality: N/A;   LUMBAR DISC SURGERY  1981   PERCUTANEOUS STENT INTERVENTION Right 03/17/2014   Procedure: PERCUTANEOUS STENT INTERVENTION;  Surgeon: Wellington Hampshire, MD;  Location: [place] CATH LAB;  Service: Cardiovascular;  Laterality: Right;  Renal   RENAL ANGIOGRAM N/A 02/13/2012   Procedure: RENAL ANGIOGRAM;  Surgeon: Wellington Hampshire, MD;  Location: [place] CATH LAB;  Service: Cardiovascular;  Laterality: N/A;   RENAL ANGIOGRAM N/A 03/17/2014   Procedure: RENAL ANGIOGRAM;  Surgeon: Wellington Hampshire, MD;  Location: [place] CATH LAB;  Service: Cardiovascular;  Laterality: N/A;   SCLEROTHERAPY  09/15/2020   Procedure: SCLEROTHERAPY;  Surgeon: [name], MD;  [facility];  Service: Endoscopy;;   TEE WITHOUT CARDIOVERSION N/A 03/02/2020   Procedure: TRANSESOPHAGEAL ECHOCARDIOGRAM (TEE);  Surgeon: Grace Isaac, MD;  Location: [facility] [place];  Service: Open Heart Surgery;  Laterality: N/A;   TONSILLECTOMY AND ADENOIDECTOMY  1947    SOCIAL HISTORY:  Social History   Socioeconomic History   Marital status: Widowed    Spouse name: Not on file   Number of  children: 1   Years of education: 78   Highest education level: Not on file  Occupational History   Occupation: Retired    Comment: Scientist, clinical (histocompatibility and immunogenetics) at [employer] Use   Smoking status: Former    Packs/day: 0.20    Years: 1.00    Total pack years: 0.20    Types: Cigarettes    Quit date: 11/13/1979    Years since quitting: 42.8   Smokeless tobacco: Never   Tobacco comments:    "smoked a few months"  Vaping Use   Vaping Use: Never used  Substance and Sexual Activity   Alcohol use: No    Alcohol/week: 0.0 standard drinks of alcohol   Drug use: Never   Sexual activity: Not on file    Comment: Hysterectomy  Other Topics Concern   Not on file  Social History Narrative   Drinks 1/2 caf coffee 3-4 cups a day    Social Determinants of Health   Financial Resource Strain: Low Risk  (11/22/2020)   Overall Financial Resource Strain (CARDIA)  Difficulty of Paying Living Expenses: Not hard at all  Food Insecurity: No Food Insecurity (11/22/2020)   Hunger Vital Sign    Worried About Running Out of Food in the Last Year: Never true    Ran Out of Food in the Last Year: Never true  Transportation Needs: No Transportation Needs (11/22/2020)   PRAPARE - Hydrologist (Medical): No    Lack of Transportation (Non-Medical): No  Physical Activity: Inactive (11/22/2020)   Exercise Vital Sign    Days of Exercise per Week: 0 days    Minutes of Exercise per Session: 0 min  Stress: Stress Concern Present (11/22/2020)   [place]    Feeling of Stress : To some extent  Social Connections: Moderately Isolated (11/22/2020)   Social Connection and Isolation Panel [NHANES]    Frequency of Communication with Friends and Family: Three times a week    Frequency of Social Gatherings with Friends and Family: Three times a week    Attends Religious Services: More than 4 times per year    Active Member of Clubs or  Organizations: No    Attends Archivist Meetings: Never    Marital Status: Widowed  Intimate Partner Violence: Not At Risk (11/22/2020)   Humiliation, Afraid, Rape, and Kick questionnaire    Fear of Current or Ex-Partner: No    Emotionally Abused: No    Physically Abused: No    Sexually Abused: No    FAMILY HISTORY:  Family History  Problem Relation Age of Onset   Heart failure Mother    Coronary artery disease Mother    Heart disease Mother    Hypertension Mother    Stroke Mother    Heart attack Father    Heart disease Father    Hypertension Father    Hypertension Sister    Parkinson's disease Sister    Migraines Sister    Kidney disease Sister    Stroke Sister    Stroke Sister    Hypertension Sister    Heart disease Brother    Hypertension Brother    Breast cancer Neg Hx     CURRENT MEDICATIONS:  Current Outpatient Medications  Medication Sig Dispense Refill   amLODipine (NORVASC) 5 MG tablet TAKE ONE TABLET BY MOUTH AT BEDTIME 90 tablet 1   aspirin EC 81 MG tablet Take 81 mg by mouth daily. Swallow whole.     Atogepant (QULIPTA) 60 MG TABS Take 60 mg by mouth daily. 30 tablet 11   azelastine (ASTELIN) 0.1 % nasal spray as needed.     cetirizine (ZYRTEC) 10 MG tablet Take 10 mg by mouth as needed.     Cholecalciferol (VITAMIN D3) 1000 UNITS CAPS Take 1,000 Units by mouth daily.      clonazePAM (KLONOPIN) 0.5 MG tablet Take 0.25 mg by mouth at bedtime.     cloNIDine (CATAPRES) 0.1 MG tablet TAKE 1 TABLET BY MOUTH EVERY DAY (Patient taking differently: as needed.) 90 tablet 3   docusate sodium (COLACE) 100 MG capsule Take 100 mg by mouth in the morning and at bedtime.     isosorbide mononitrate (IMDUR) 30 MG 24 hr tablet TAKE 0.5 TABLETS BY MOUTH EVERY DAY 45 tablet 1   Lactobacillus (REJUVAFLOR) CAPS Take by mouth.     metoprolol tartrate (LOPRESSOR) 50 MG tablet TAKE ONE TABLET BY MOUTH TWICE DAILY 180 tablet 1   Multiple Vitamins-Minerals (PRESERVISION  AREDS 2 PO) Take 1  tablet by mouth in the morning and at bedtime.     Omega-3 Fatty Acids (FISH OIL PO) Take 1,400 mg by mouth daily.     rosuvastatin (CRESTOR) 20 MG tablet TAKE ONE TABLET BY MOUTH AT BEDTIME 90 tablet 1   nitroGLYCERIN (NITROSTAT) 0.4 MG SL tablet Place 0.4 mg under the tongue every 5 (five) minutes as needed for chest pain. (Patient not taking: Reported on 09/11/2022)     prochlorperazine (COMPAZINE) 10 MG tablet Take by mouth. (Patient not taking: Reported on 09/11/2022)     No current facility-administered medications for this visit.    ALLERGIES:  Allergies  Allergen Reactions   Cefixime Shortness Of Breath    Patient is not familiar with this listed allergy   Conjugated Estrogens Other (See Comments)    REACTION: flushing   Morphine And Related Other (See Comments)    Morphine injection Neck pain   Codeine Other (See Comments)    REACTION: dizziness   Iohexol      Desc: pt has anxiety and an irregular heartbeat. contrast exacerbates this. pt was very uncomfortable after a 20cc bolus for an miroi. we did w/o iv 11/09 per dr. Kris Hartmann. pt will talk w/ md about this and will probably not get iv contrast in the future at her re, Onset Date: [id number] Multi CT's with contrast [facility] at [facility] without pre meds, Heart Cath without pre med documented 2021. CT A/P with contrast without premeds 11/2020 without issues     Adenosine Other (See Comments)    Elevated heart rate when doing stress test    PHYSICAL EXAM:  Performance status (ECOG): 1 - Symptomatic but completely ambulatory  Vitals:   09/11/22 1416  BP: (!) 225/79  Pulse: 74  Resp: 16  Temp: 98.4 F (36.9 C)  SpO2: 99%   Wt Readings from Last 3 Encounters:  09/11/22 126 lb 11.2 oz (57.5 kg)  06/07/22 126 lb 12.8 oz (57.5 kg)  05/08/22 127 lb 9.6 oz (57.9 kg)   Physical Exam Vitals reviewed.  Constitutional:      Appearance: Normal appearance.  Cardiovascular:     Rate and Rhythm: Normal rate and  regular rhythm.     Pulses: Normal pulses.     Heart sounds: Normal heart sounds.  Pulmonary:     Effort: Pulmonary effort is normal.     Breath sounds: Normal breath sounds.  Musculoskeletal:     Right lower leg: No edema.     Left lower leg: No edema.  Neurological:     General: No focal deficit present.     Mental Status: She is alert and oriented to person, place, and time.  Psychiatric:        Mood and Affect: Mood normal.        Behavior: Behavior normal.      LABORATORY DATA:  I have reviewed the labs as listed.     Latest Ref Rng & Units 09/05/2022    2:47 PM 06/07/2022   12:14 PM 02/27/2022    1:00 PM  CBC  WBC 4.0 - 10.5 K/uL 2.9  2.8  3.0   Hemoglobin 12.0 - 15.0 g/dL 13.4  13.4  12.9   Hematocrit 36.0 - 46.0 % 40.4  40.1  39.4   Platelets 150 - 400 K/uL 176  184  180       Latest Ref Rng & Units 09/05/2022    2:47 PM 06/07/2022   12:14 PM 05/31/2022    2:02 PM  CMP  Glucose 70 - 99 mg/dL 147  99    BUN 8 - 23 mg/dL 19  17    Creatinine 0.44 - 1.00 mg/dL 0.93  0.68  1.00   Sodium 135 - 145 mmol/L 138  134    Potassium 3.5 - 5.1 mmol/L 3.9  4.3    Chloride 98 - 111 mmol/L 101  103    CO2 22 - 32 mmol/L 29  25    Calcium 8.9 - 10.3 mg/dL 10.3  10.0    Total Protein 6.5 - 8.1 g/dL 7.2  7.0    Total Bilirubin 0.3 - 1.2 mg/dL 0.9  0.8    Alkaline Phos 38 - 126 U/L 88  95    AST 15 - 41 U/L 27  27    ALT 0 - 44 U/L 27  26      DIAGNOSTIC IMAGING:  I have independently reviewed the scans and discussed with the patient. No results found.   ASSESSMENT:  1.  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 - CT AP with contrast on 08/10/2020 with abnormal appearance of right colon with colocolonic intussusception with wall thickening and abnormally enlarged adjacent right mesenteric lymph nodes. - CT chest with contrast on 09/07/2020 with no evidence of metastatic disease in the  chest. -Colonoscopy on 09/15/2020-large spherical, ulcerated, partially necrotic tumor in the transverse/right colon.  Scope could not be maneuvered more proximally to the tumor. - Right hemicolectomy on 10/28/2020 by Dr. Dema Severin. - Pathology shows invasive moderately to poorly differentiated carcinoma involving the mid ascending colon, spanning 10 cm, tumor invades through muscularis propria into pericolorectal tissue, margins negative.  9/39 lymph nodes involved.  Lymphovascular invasion present.  PT3PN2B. - Loss of MLH1 and PMS2. - BRAF V600 E mutation positive.  MSI-high. -She has refused adjuvant chemotherapy.   2.  Social/family history: - She is accompanied by her daughter today.  Lives by herself and is independent of ADLs and IADLs.  Worked as a Catering manager. - Maternal aunt had ovarian cancer.  No other malignancies.   PLAN:  1.  Stage IIIc (PT3PN2B) adenocarcinoma of ascending colon: - CTAP on 05/31/2022 with no evidence of recurrence. - No bleeding per rectum or melena. - Labs from 09/05/2022 with normal LFTs.  CBC was grossly normal with leukopenia which is stable. - CEA was 1.6. - RTC 6 months with repeat CTAP with contrast and CEA level.      Orders placed this encounter:  Orders Placed This Encounter  Procedures   CT Abdomen Pelvis W Contrast   CBC with Differential/Platelet   Comprehensive metabolic panel   CEA      Derek Jack, MD [place] [phone]   I, Thana Ates, am acting as a scribe for Dr. Derek Jack.  I, Derek Jack MD, have reviewed the above documentation for accuracy and completeness, and I agree with the above.

## 2022-09-13 DIAGNOSIS — Z23 Encounter for immunization: Secondary | ICD-10-CM | POA: Diagnosis not present

## 2022-09-25 DIAGNOSIS — H6123 Impacted cerumen, bilateral: Secondary | ICD-10-CM | POA: Diagnosis not present

## 2022-09-25 DIAGNOSIS — Z789 Other specified health status: Secondary | ICD-10-CM | POA: Diagnosis not present

## 2022-09-25 DIAGNOSIS — I10 Essential (primary) hypertension: Secondary | ICD-10-CM | POA: Diagnosis not present

## 2022-09-25 DIAGNOSIS — I152 Hypertension secondary to endocrine disorders: Secondary | ICD-10-CM | POA: Diagnosis not present

## 2022-09-25 DIAGNOSIS — E1159 Type 2 diabetes mellitus with other circulatory complications: Secondary | ICD-10-CM | POA: Diagnosis not present

## 2022-09-25 DIAGNOSIS — Z299 Encounter for prophylactic measures, unspecified: Secondary | ICD-10-CM | POA: Diagnosis not present

## 2022-09-25 IMAGING — CT CT ABD-PELV W/ CM
2 of 5 series · 16 of 46 positions shown, 18 images · IV contrast (Omnipaque or Isovue)
Comparison: CT abdomen pelvis August 09, 2020 and chest CT
September 07, 2020, CT abdomen and pelvis June 02, 2012 and [place]

CLINICAL DATA: History of stage III adenocarcinoma of the ascending
colon status post right hemicolectomy on 10/28/2020,
chemo/immunotherapy under workup.

EXAM:
CT ABDOMEN AND PELVIS WITH CONTRAST
TECHNIQUE: Multidetector CT imaging of the abdomen and pelvis was performed
using the standard protocol following bolus administration of
intravenous contrast.
CONTRAST:  75mL OMNIPAQUE IOHEXOL 300 MG/ML  SOLN

[Series 2: axial st · axial · 0.74mm/px · z∈[+816,+1176]mm · 13 of 84 slices shown, 15 images]
[im 6/84  soft-tissue]
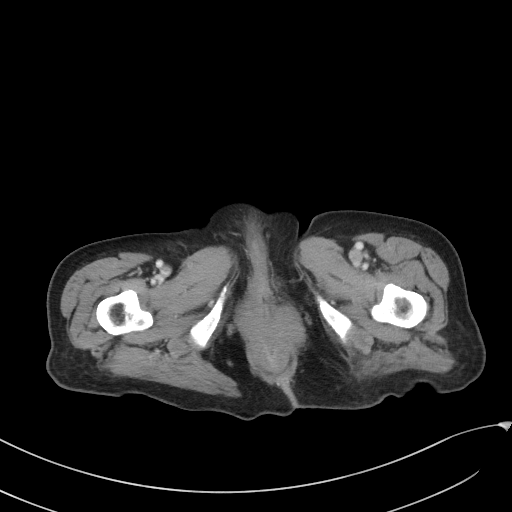
[im 6/84  bone]
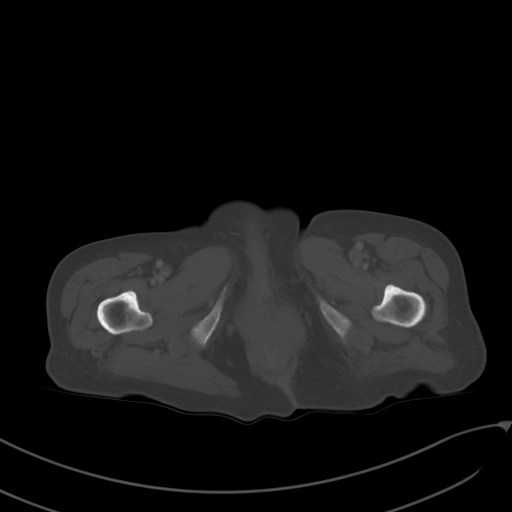
[im 11/84  soft-tissue]
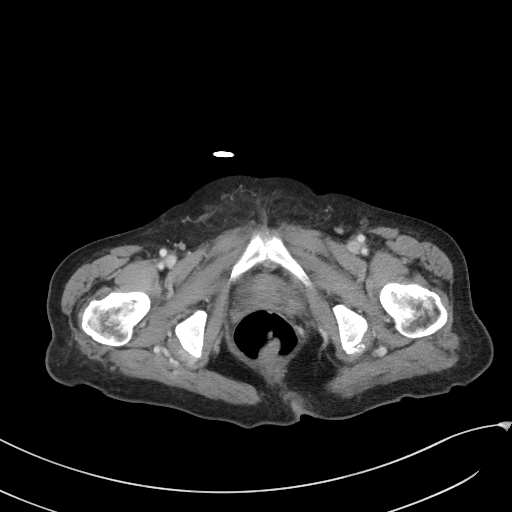
[im 16/84  soft-tissue]
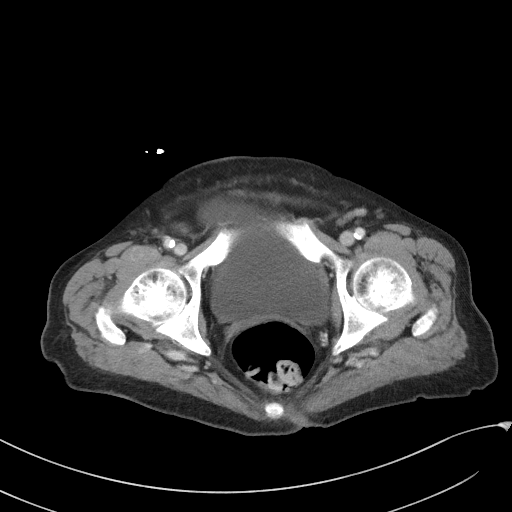
[im 26/84  soft-tissue]
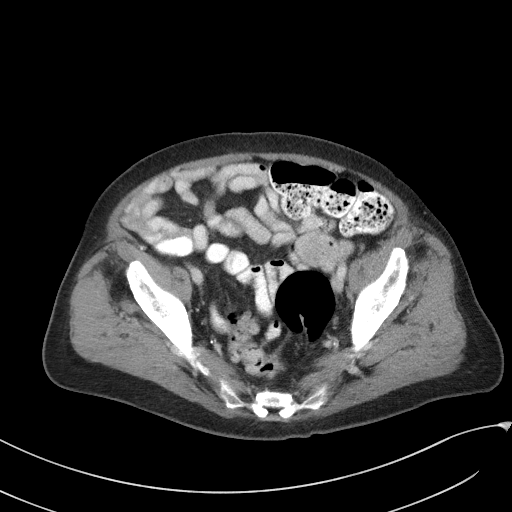
[im 32/84  soft-tissue]
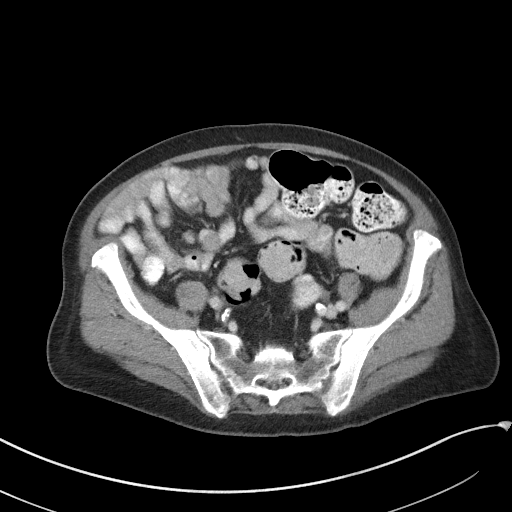
[im 37/84  soft-tissue]
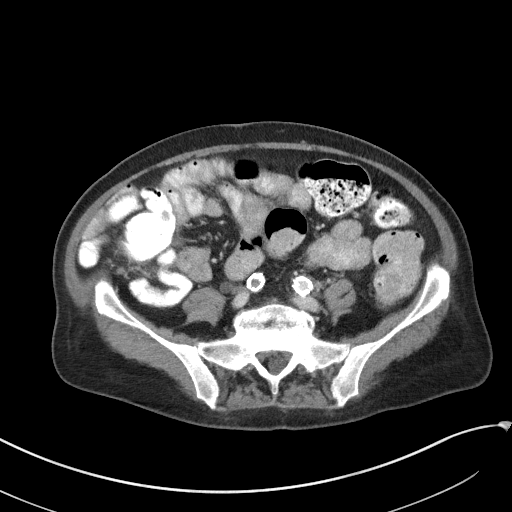
[im 42/84  soft-tissue]
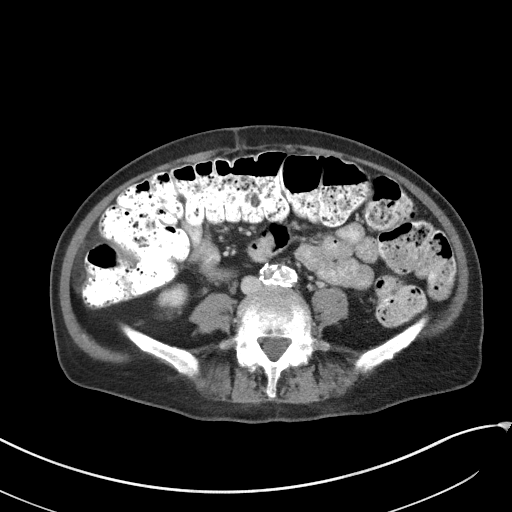
[im 47/84  soft-tissue]
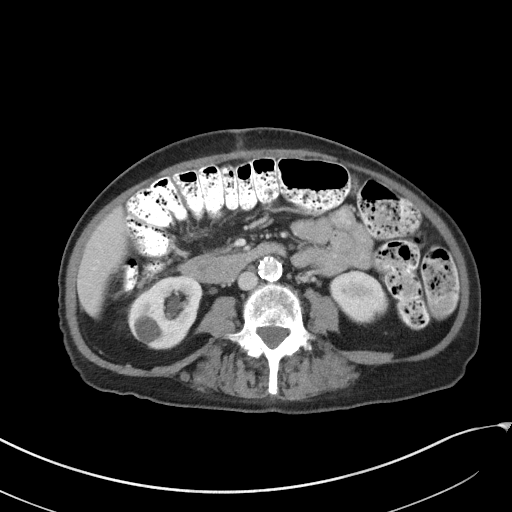
[im 52/84  soft-tissue]
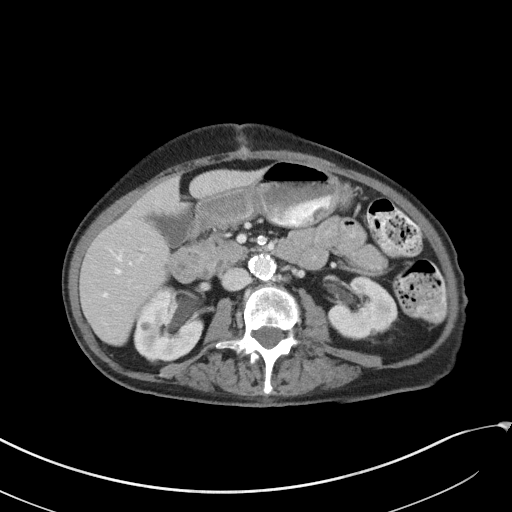
[im 52/84  bone]
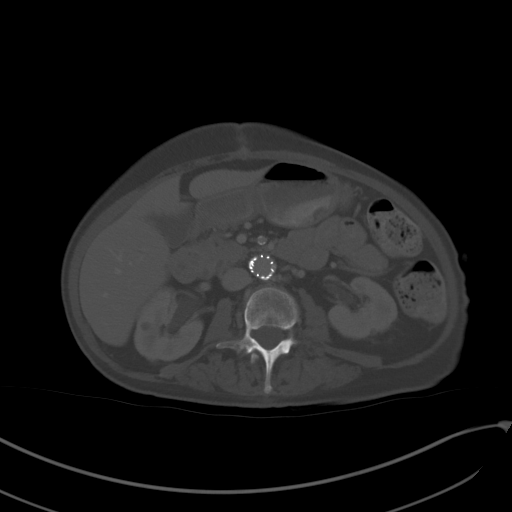
[im 58/84  soft-tissue]
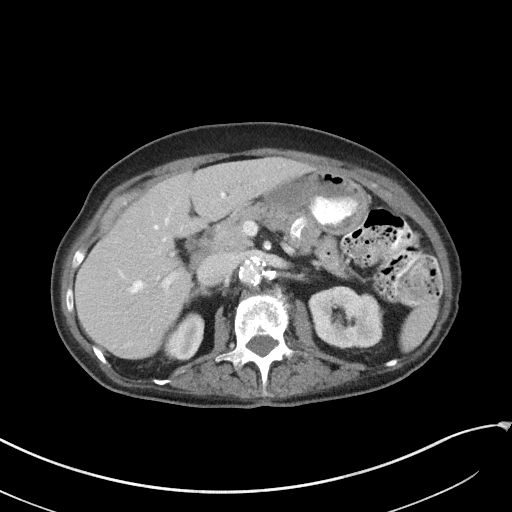
[im 68/84  soft-tissue]
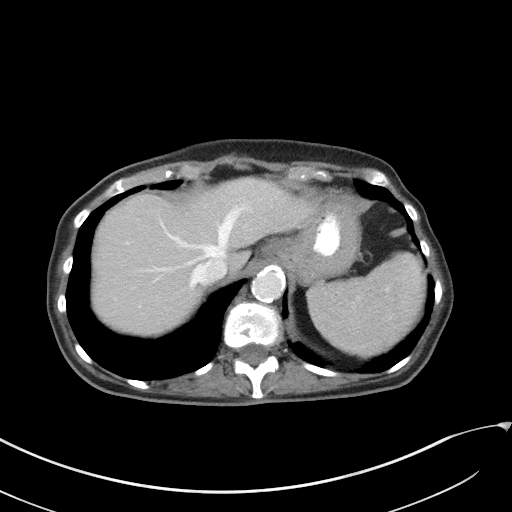
[im 73/84  soft-tissue]
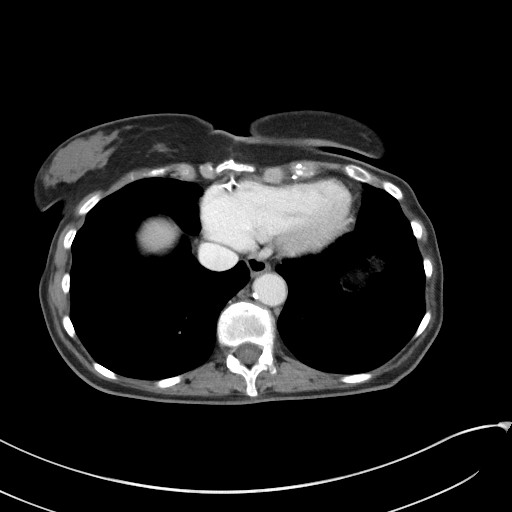
[im 78/84  soft-tissue]
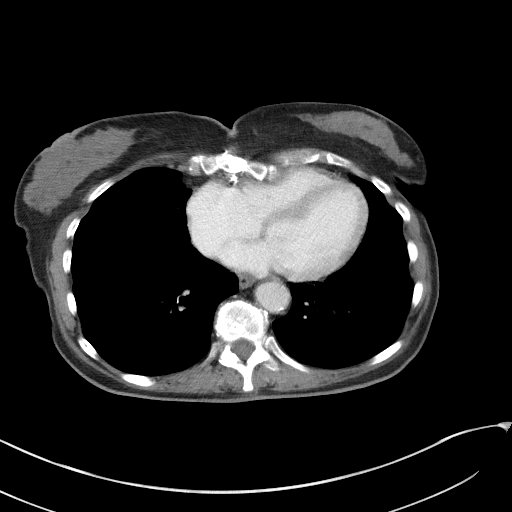

[Series 6: coronal st · coronal · 0.72mm/px · 3 of 83 slices shown]
[im 28/83  soft-tissue]
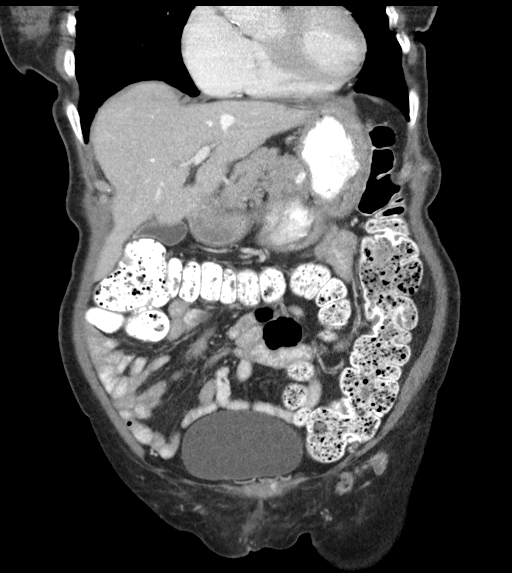
[im 37/83  soft-tissue]
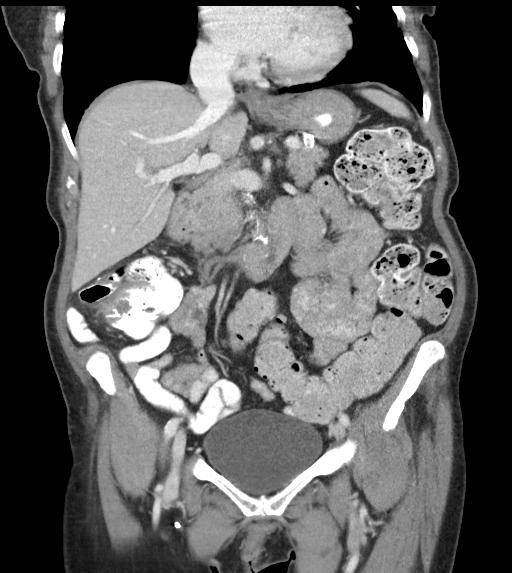
[im 46/83  soft-tissue]
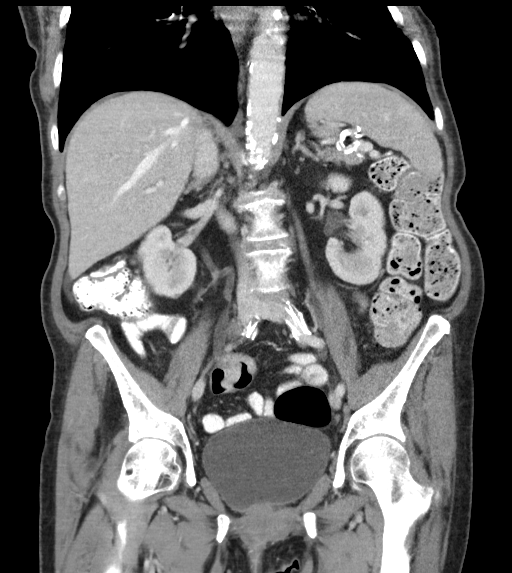

[16 of 46 positions shown; findings below may reference images not displayed]

FINDINGS: Lower chest: Prior median sternotomy and CABG. No acute abnormality.

Hepatobiliary: No focal liver abnormality is seen. No gallstones,
gallbladder wall thickening, or biliary dilatation.

Pancreas: Unremarkable. No pancreatic ductal dilatation or
surrounding inflammatory changes.

Spleen: Normal in size without focal abnormality.

Adrenals/Urinary Tract: Bilateral adrenal glands are unremarkable.
Cortical 2 cm right renal cyst. Additionally there are bilateral
small scattered hypodensities in the kidneys which are too small to
characterize but most consistent with cysts. Small focus of
calcification along the left interpolar cortical rim (series 2,
image 24), likely sequela of prior insult. No hydronephrosis. No
nephrolithiasis. Urinary bladder is distended without evidence of
wall thickening.

Stomach/Bowel: Postsurgical changes of right hemicolectomy with
anastomosis for ascending colonic mass resection. Stomach is
predominantly decompressed. No evidence of small-bowel obstruction,
wall thickening or acute inflammation. Moderate volume of formed
stool throughout the colon. No suspicious colonic wall thickening or
inflammation.

Vascular/Lymphatic: Extensive aortic and branch vessel
atherosclerosis. Stent graft in the distal aorta extending into the
bilateral common iliacs, with similar apparent occlusion of the
right common iliac limb. No pathologically enlarged abdominopelvic
lymph nodes.

Reproductive: Status post hysterectomy. No adnexal masses.

Other: No ascites, peritoneal nodularity or pneumoperitoneum.

Musculoskeletal: Multilevel degenerative changes spine. No
suspicious lytic or blastic lesion of bone.
IMPRESSION: 1. Postsurgical changes of right hemicolectomy with anastomosis for
ascending colonic mass resection. No evidence of recurrent or
metastatic disease within the abdomen or pelvis.
2. Extensive aortic and branch vessel atherosclerosis with stent
graft in the distal aorta extending into the bilateral common
iliacs, with similar apparent occlusion of the right common iliac
limb.
3. Moderate volume of formed stool throughout the colon.
4. Aortic atherosclerosis.

Aortic Atherosclerosis ([id number]).

## 2022-10-10 DIAGNOSIS — I10 Essential (primary) hypertension: Secondary | ICD-10-CM | POA: Diagnosis not present

## 2022-10-27 ENCOUNTER — Other Ambulatory Visit: Payer: Self-pay | Admitting: Cardiology

## 2022-12-21 ENCOUNTER — Other Ambulatory Visit: Payer: Self-pay | Admitting: Cardiology

## 2022-12-25 DIAGNOSIS — G4700 Insomnia, unspecified: Secondary | ICD-10-CM | POA: Diagnosis not present

## 2022-12-25 DIAGNOSIS — I25119 Atherosclerotic heart disease of native coronary artery with unspecified angina pectoris: Secondary | ICD-10-CM | POA: Diagnosis not present

## 2022-12-25 DIAGNOSIS — Z299 Encounter for prophylactic measures, unspecified: Secondary | ICD-10-CM | POA: Diagnosis not present

## 2022-12-25 DIAGNOSIS — I10 Essential (primary) hypertension: Secondary | ICD-10-CM | POA: Diagnosis not present

## 2022-12-25 DIAGNOSIS — I779 Disorder of arteries and arterioles, unspecified: Secondary | ICD-10-CM | POA: Diagnosis not present

## 2023-01-09 DIAGNOSIS — I10 Essential (primary) hypertension: Secondary | ICD-10-CM | POA: Diagnosis not present

## 2023-02-09 DIAGNOSIS — I10 Essential (primary) hypertension: Secondary | ICD-10-CM | POA: Diagnosis not present

## 2023-02-18 DIAGNOSIS — H353132 Nonexudative age-related macular degeneration, bilateral, intermediate dry stage: Secondary | ICD-10-CM | POA: Diagnosis not present

## 2023-02-21 ENCOUNTER — Encounter (INDEPENDENT_AMBULATORY_CARE_PROVIDER_SITE_OTHER): Payer: Medicare Other | Admitting: Ophthalmology

## 2023-02-21 DIAGNOSIS — H35722 Serous detachment of retinal pigment epithelium, left eye: Secondary | ICD-10-CM | POA: Diagnosis not present

## 2023-02-21 DIAGNOSIS — H353221 Exudative age-related macular degeneration, left eye, with active choroidal neovascularization: Secondary | ICD-10-CM | POA: Diagnosis not present

## 2023-02-21 DIAGNOSIS — H353132 Nonexudative age-related macular degeneration, bilateral, intermediate dry stage: Secondary | ICD-10-CM | POA: Diagnosis not present

## 2023-02-21 DIAGNOSIS — H02054 Trichiasis without entropian left upper eyelid: Secondary | ICD-10-CM | POA: Diagnosis not present

## 2023-03-06 ENCOUNTER — Ambulatory Visit (HOSPITAL_COMMUNITY)
Admission: RE | Admit: 2023-03-06 | Discharge: 2023-03-06 | Disposition: A | Discharge status: Home / Self Care | Payer: Medicare Other | Source: Ambulatory Visit | Attending: Hematology | Admitting: Hematology

## 2023-03-06 ENCOUNTER — Inpatient Hospital Stay: Payer: Medicare Other | Attending: Hematology

## 2023-03-06 DIAGNOSIS — N281 Cyst of kidney, acquired: Secondary | ICD-10-CM | POA: Diagnosis not present

## 2023-03-06 DIAGNOSIS — C189 Malignant neoplasm of colon, unspecified: Secondary | ICD-10-CM | POA: Diagnosis not present

## 2023-03-06 DIAGNOSIS — C182 Malignant neoplasm of ascending colon: Secondary | ICD-10-CM | POA: Insufficient documentation

## 2023-03-06 LAB — CBC WITH DIFFERENTIAL/PLATELET
Abs Immature Granulocytes: 0.01 10*3/uL (ref 0.00–0.07)
Basophils Absolute: 0 10*3/uL (ref 0.0–0.1)
Basophils Relative: 0 %
Eosinophils Absolute: 0.1 10*3/uL (ref 0.0–0.5)
Eosinophils Relative: 2 %
HCT: 44 % (ref 36.0–46.0)
Hemoglobin: 14.4 g/dL (ref 12.0–15.0)
Immature Granulocytes: 0 %
Lymphocytes Relative: 29 %
Lymphs Abs: 1 10*3/uL (ref 0.7–4.0)
MCH: 29.1 pg (ref 26.0–34.0)
MCHC: 32.7 g/dL (ref 30.0–36.0)
MCV: 88.9 fL (ref 80.0–100.0)
Monocytes Absolute: 0.2 10*3/uL (ref 0.1–1.0)
Monocytes Relative: 7 %
Neutro Abs: 2.2 10*3/uL (ref 1.7–7.7)
Neutrophils Relative %: 62 %
Platelets: 205 10*3/uL (ref 150–400)
RBC: 4.95 MIL/uL (ref 3.87–5.11)
RDW: 14.1 % (ref 11.5–15.5)
WBC: 3.5 10*3/uL — ABNORMAL LOW (ref 4.0–10.5)
nRBC: 0 % (ref 0.0–0.2)

## 2023-03-06 LAB — COMPREHENSIVE METABOLIC PANEL
ALT: 25 U/L (ref 0–44)
AST: 27 U/L (ref 15–41)
Albumin: 4.6 g/dL (ref 3.5–5.0)
Alkaline Phosphatase: 98 U/L (ref 38–126)
Anion gap: 10 (ref 5–15)
BUN: 24 mg/dL — ABNORMAL HIGH (ref 8–23)
CO2: 26 mmol/L (ref 22–32)
Calcium: 10.5 mg/dL — ABNORMAL HIGH (ref 8.9–10.3)
Chloride: 98 mmol/L (ref 98–111)
Creatinine, Ser: 0.67 mg/dL (ref 0.44–1.00)
GFR, Estimated: >60 mL/min (ref >60)
Glucose, Bld: 125 mg/dL — ABNORMAL HIGH (ref 70–99)
Potassium: 4 mmol/L (ref 3.5–5.1)
Sodium: 134 mmol/L — ABNORMAL LOW (ref 135–145)
Total Bilirubin: 0.8 mg/dL (ref 0.3–1.2)
Total Protein: 7.5 g/dL (ref 6.5–8.1)

## 2023-03-06 LAB — POCT I-STAT CREATININE: Creatinine, Ser: 0.8 mg/dL (ref 0.44–1.00)

## 2023-03-06 MED ORDER — IOHEXOL 350 MG/ML SOLN: 100.0000 mL | Freq: Once | INTRAVENOUS | Status: DC | PRN

## 2023-03-06 MED ORDER — IOHEXOL 300 MG/ML  SOLN
100.0000 mL | Freq: Once | INTRAMUSCULAR | Status: AC | PRN
  Administered 2023-03-06: 100 mL via INTRAVENOUS

## 2023-03-08 LAB — CEA: CEA: 1.9 ng/mL (ref 0.0–4.7)

## 2023-03-12 DIAGNOSIS — I10 Essential (primary) hypertension: Secondary | ICD-10-CM | POA: Diagnosis not present

## 2023-03-12 NOTE — Progress Notes (Signed)
[facility] [address], [place] [address]    Clinic Day:  03/13/2023  Referring physician: Ignatius Specking, MD  Patient Care Team: Ignatius Specking, MD as PCP - General (Internal Medicine) Jonelle Sidle, MD as PCP - Cardiology (Cardiology) Melina Fiddler, MD as Consulting Physician (Sports Medicine) Delight Ovens, MD (Inactive) as Consulting Physician (Cardiothoracic Surgery) Therese Sarah, RN as Oncology Nurse Navigator (Oncology)   ASSESSMENT & PLAN:   Assessment: 1.  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 - CT AP with contrast on 08/10/2020 with abnormal appearance of right colon with colocolonic intussusception with wall thickening and abnormally enlarged adjacent right mesenteric lymph nodes. - CT chest with contrast on 09/07/2020 with no evidence of metastatic disease in the chest. -Colonoscopy on 09/15/2020-large spherical, ulcerated, partially necrotic tumor in the transverse/right colon.  Scope could not be maneuvered more proximally to the tumor. - Right hemicolectomy on 10/28/2020 by Dr. Cliffton Asters. - Pathology shows invasive moderately to poorly differentiated carcinoma involving the mid ascending colon, spanning 10 cm, tumor invades through muscularis propria into pericolorectal tissue, margins negative.  9/39 lymph nodes involved.  Lymphovascular invasion present.  PT3PN2B. - Loss of MLH1 and PMS2. - BRAF V600 E mutation positive.  MSI-high. -She has refused adjuvant chemotherapy.   2.  Social/family history: - She is accompanied by her daughter today.  Lives by herself and is independent of ADLs and IADLs.  Worked as a Scientist, physiological. - Maternal aunt had ovarian cancer.  No other malignancies.    Plan: 1.  Stage IIIc (PT3PN2B) adenocarcinoma of ascending colon: - She does not report any change in bowel habits or bleeding per rectum. - Labs from 03/06/2023:  CEA 1.9.  LFTs are normal. - CTAP from 03/06/2023: No evidence of recurrence or metastatic disease. - Recommend follow-up in 6 months with repeat CEA and CT scan.  2.  Mild hypercalcemia: - Calcium was 10.5.  She is taking vitamin D3 1 tablet daily.  Dose is not known. - She does not report taking calcium supplements. - She had history of mild hypercalcemia in 2015 and prior to that. - She will follow-up with her PMD in 1 month and have her calcium and vitamin D levels repeated.    Orders Placed This Encounter  Procedures   CT Abdomen Pelvis W Contrast    Standing Status:   Future    Standing Expiration Date:   03/12/2024    Order Specific Question:   If indicated for the ordered procedure, I authorize the administration of contrast media per Radiology protocol    Answer:   Yes    Order Specific Question:   Does the patient have a contrast media/X-ray dye allergy?    Answer:   Yes    Order Specific Question:   Preferred imaging location?    Answer:   [facility]    Order Specific Question:   Release to patient    Answer:   Immediate [1]    Order Specific Question:   If indicated for the ordered procedure, I authorize the administration of oral contrast media per Radiology protocol    Answer:   Yes   CBC with Differential/Platelet    Standing Status:   Future    Standing Expiration Date:   03/12/2024    Order Specific Question:   Release to patient    Answer:   Immediate  Comprehensive metabolic panel    Standing Status:   Future    Standing Expiration Date:   03/12/2024    Order Specific Question:   Release to patient    Answer:   Immediate   CEA    Standing Status:   Future    Standing Expiration Date:   03/12/2024      I,[name],acting as a scribe for Doreatha Massed, MD.,have documented all relevant documentation on the behalf of Doreatha Massed, MD,as directed by  Doreatha Massed, MD while in the presence of Doreatha Massed, MD.   I,  Doreatha Massed MD, have reviewed the above documentation for accuracy and completeness, and I agree with the above.   Doreatha Massed, MD   5/1/20245:26 PM  CHIEF COMPLAINT:   Diagnosis: right colon cancer    Cancer Staging  Cancer of ascending colon [facility]) Staging form: Colon and Rectum, AJCC 8th Edition - Clinical stage from 01/15/2021: Stage IIIC (cT3, cN2b, cM0) - Signed by Doreatha Massed, MD on 01/15/2021    Prior Therapy: Right hemicolectomy on 10/28/2020  Current Therapy:  surveillance   HISTORY OF PRESENT ILLNESS:   Oncology History  Cancer of ascending colon (HCC)  01/15/2021 Initial Diagnosis   Cancer of ascending colon (HCC)   01/15/2021 Cancer Staging   Staging form: Colon and Rectum, AJCC 8th Edition - Clinical stage from 01/15/2021: Stage IIIC (cT3, cN2b, cM0) - Signed by Doreatha Massed, MD on 01/15/2021 Stage prefix: Initial diagnosis Microsatellite instability (MSI): Unstable high      INTERVAL HISTORY:   [name] is a 87 y.o. female presenting to clinic today for follow up of right colon cancer. She was last seen by me on 09/11/22.  Since her last visit, she underwent surveillance CT A/P on 03/06/23 showing: no evidence of recurrent or metastatic disease.  Today, she states that she is doing well overall. Her appetite level is at 100%. Her energy level is at 100%.  PAST MEDICAL HISTORY:   Past Medical History: Past Medical History:  Diagnosis Date   Anemia    Anxiety    Arthritis    CAD (coronary artery disease)    Status post CABG April 2021 - Dr. Tyrone Sage   Chronic constipation    Claudication [facility])    Chronic occlusion of the right limb of aortic stent graft.   Colon cancer (HCC)    Diverticulosis    Left sided noted on colonoscopy 02/2006   Ectopic atrial tachycardia    Esophageal reflux disease    Essential hypertension    Hiatal hernia    History of TIA (transient ischemic attack)    Hyperlipidemia    Lumbar radiculopathy     Macular degeneration    Migraines    Penetrating atherosclerotic ulcer of aorta (HCC)    Complex ulcerated plaque of the infrarenal abdominal aorta mild renal artery stenosis on the right side, normal left renal artery catheterization 2009, status post aortic stent graft   Renal artery stenosis (HCC)    a. Right renal artery stent April 2013 - Dr. Kirke Corin;  b. 03/2014 PTA/stenting of RRA 2/2 ISR: 6x18 Herculink stent.   Trigeminal neuralgia    Right   Type 2 diabetes mellitus (HCC)     Surgical History: Past Surgical History:  Procedure Laterality Date   ABDOMINAL AORTAGRAM N/A 02/13/2012   Procedure: ABDOMINAL Ronny Flurry;  Surgeon: Iran Ouch, MD;  Location: [facility] CATH LAB;  Service: Cardiovascular;  Laterality: N/A;   ABDOMINAL AORTAGRAM N/A 03/17/2014   Procedure:  ABDOMINAL AORTAGRAM;  Surgeon: Iran Ouch, MD;  Location: [facility] CATH LAB;  Service: Cardiovascular;  Laterality: N/A;   ABDOMINAL HYSTERECTOMY  1982   BIOPSY  09/15/2020   Procedure: BIOPSY;  Surgeon: Rachael Fee, MD;  Location: [facility] ENDOSCOPY;  Service: Endoscopy;;   BREAST BIOPSY Left 1990's   CAROTID ENDARTERECTOMY Left 1993   COLONOSCOPY     COLONOSCOPY WITH PROPOFOL N/A 09/15/2020   Procedure: COLONOSCOPY WITH PROPOFOL;  Surgeon: Rachael Fee, MD;  Location: [facility] ENDOSCOPY;  Service: Endoscopy;  Laterality: N/A;   CORONARY ARTERY BYPASS GRAFT N/A 03/02/2020   Procedure: CORONARY ARTERY BYPASS GRAFTING (CABG)x 3, LIMA TO LAD, SVG TO DIAGONAL, SVG TO RCA, WITH OPEN HARVESTING OF LEFT LOWER GREATER SAPHENOUS VEIN AND RIGHT AND LEFT GREATER SAPHENOUS VEIN HARVESTED ENDOSCOPICALLY;  Surgeon: Delight Ovens, MD;  Location: [facility] OR;  Service: Open Heart Surgery;  Laterality: N/A;   Endologix powerlink bifurcated     Covered stent graft   ENDOVEIN HARVEST OF GREATER SAPHENOUS VEIN Bilateral 03/02/2020   Procedure: Mack Guise Of Greater Saphenous Vein;  Surgeon: Delight Ovens, MD;  Location: [facility] OR;  Service:  Open Heart Surgery;  Laterality: Bilateral;   LAPAROSCOPIC RIGHT HEMI COLECTOMY Right 10/28/2020   Procedure: LAPAROSCOPIC RIGHT HEMI COLECTOMY,;  Surgeon: Andria Meuse, MD;  Location: [facility] ORS;  Service: General;  Laterality: Right;   LEFT HEART CATH AND CORONARY ANGIOGRAPHY N/A 01/28/2020   Procedure: LEFT HEART CATH AND CORONARY ANGIOGRAPHY;  Surgeon: Yvonne Kendall, MD;  Location: [facility] INVASIVE CV LAB;  Service: Cardiovascular;  Laterality: N/A;   LEFT HEART CATHETERIZATION WITH CORONARY ANGIOGRAM N/A 03/17/2014   Procedure: LEFT HEART CATHETERIZATION WITH CORONARY ANGIOGRAM;  Surgeon: Iran Ouch, MD;  Location: [facility] CATH LAB;  Service: Cardiovascular;  Laterality: N/A;   LUMBAR DISC SURGERY  1981   PERCUTANEOUS STENT INTERVENTION Right 03/17/2014   Procedure: PERCUTANEOUS STENT INTERVENTION;  Surgeon: Iran Ouch, MD;  Location: [facility] CATH LAB;  Service: Cardiovascular;  Laterality: Right;  Renal   RENAL ANGIOGRAM N/A 02/13/2012   Procedure: RENAL ANGIOGRAM;  Surgeon: Iran Ouch, MD;  Location: [facility] CATH LAB;  Service: Cardiovascular;  Laterality: N/A;   RENAL ANGIOGRAM N/A 03/17/2014   Procedure: RENAL ANGIOGRAM;  Surgeon: Iran Ouch, MD;  Location: [facility] CATH LAB;  Service: Cardiovascular;  Laterality: N/A;   SCLEROTHERAPY  09/15/2020   Procedure: SCLEROTHERAPY;  Surgeon: Rachael Fee, MD;  Location: [facility] ENDOSCOPY;  Service: Endoscopy;;   TEE WITHOUT CARDIOVERSION N/A 03/02/2020   Procedure: TRANSESOPHAGEAL ECHOCARDIOGRAM (TEE);  Surgeon: Delight Ovens, MD;  Location: [facility] OR;  Service: Open Heart Surgery;  Laterality: N/A;   TONSILLECTOMY AND ADENOIDECTOMY  1947    Social History: Social History   Socioeconomic History   Marital status: Widowed    Spouse name: Not on file   Number of children: 1   Years of education: 54   Highest education level: Not on file  Occupational History   Occupation: Retired    Comment: Solicitor at [employer]  Tobacco Use   Smoking  status: Former    Packs/day: 0.20    Years: 1.00    Additional pack years: 0.00    Total pack years: 0.20    Types: Cigarettes    Quit date: 11/13/1979    Years since quitting: 43.3   Smokeless tobacco: Never   Tobacco comments:    "smoked a few months"  Vaping Use   Vaping Use: Never used  Substance and  Sexual Activity   Alcohol use: No    Alcohol/week: 0.0 standard drinks of alcohol   Drug use: Never   Sexual activity: Not on file    Comment: Hysterectomy  Other Topics Concern   Not on file  Social History Narrative   Drinks 1/2 caf coffee 3-4 cups a day    Social Determinants of Health   Financial Resource Strain: Low Risk  (11/22/2020)   Overall Financial Resource Strain (CARDIA)    Difficulty of Paying Living Expenses: Not hard at all  Food Insecurity: No Food Insecurity (11/22/2020)   Hunger Vital Sign    Worried About Running Out of Food in the Last Year: Never true    Ran Out of Food in the Last Year: Never true  Transportation Needs: No Transportation Needs (11/22/2020)   PRAPARE - Administrator, Civil Service (Medical): No    Lack of Transportation (Non-Medical): No  Physical Activity: Inactive (11/22/2020)   Exercise Vital Sign    Days of Exercise per Week: 0 days    Minutes of Exercise per Session: 0 min  Stress: Stress Concern Present (11/22/2020)   [employer] of Occupational Health - Occupational Stress Questionnaire    Feeling of Stress : To some extent  Social Connections: Moderately Isolated (11/22/2020)   Social Connection and Isolation Panel [NHANES]    Frequency of Communication with Friends and Family: Three times a week    Frequency of Social Gatherings with Friends and Family: Three times a week    Attends Religious Services: More than 4 times per year    Active Member of Clubs or Organizations: No    Attends Banker Meetings: Never    Marital Status: Widowed  Intimate Partner Violence: Not At Risk (11/22/2020)    Humiliation, Afraid, Rape, and Kick questionnaire    Fear of Current or Ex-Partner: No    Emotionally Abused: No    Physically Abused: No    Sexually Abused: No    Family History: Family History  Problem Relation Age of Onset   Heart failure Mother    Coronary artery disease Mother    Heart disease Mother    Hypertension Mother    Stroke Mother    Heart attack Father    Heart disease Father    Hypertension Father    Hypertension Sister    Parkinson's disease Sister    Migraines Sister    Kidney disease Sister    Stroke Sister    Stroke Sister    Hypertension Sister    Heart disease Brother    Hypertension Brother    Breast cancer Neg Hx     Current Medications:  Current Outpatient Medications:    amLODipine (NORVASC) 5 MG tablet, TAKE ONE TABLET BY MOUTH AT BEDTIME, Disp: 90 tablet, Rfl: 0   aspirin EC 81 MG tablet, Take 81 mg by mouth daily. Swallow whole., Disp: , Rfl:    cetirizine (ZYRTEC) 10 MG tablet, Take 10 mg by mouth as needed., Disp: , Rfl:    Cholecalciferol (VITAMIN D3) 1000 UNITS CAPS, Take 1,000 Units by mouth daily. , Disp: , Rfl:    docusate sodium (COLACE) 100 MG capsule, Take 100 mg by mouth in the morning and at bedtime., Disp: , Rfl:    isosorbide mononitrate (IMDUR) 30 MG 24 hr tablet, TAKE 0.5 TABLETS BY MOUTH EVERY DAY, Disp: 45 tablet, Rfl: 3   Lactobacillus (REJUVAFLOR) CAPS, Take by mouth., Disp: , Rfl:    metoprolol tartrate (  LOPRESSOR) 50 MG tablet, TAKE ONE TABLET BY MOUTH TWICE DAILY, Disp: 180 tablet, Rfl: 3   Multiple Vitamins-Minerals (PRESERVISION AREDS 2 PO), Take 1 tablet by mouth in the morning and at bedtime., Disp: , Rfl:    Omega-3 Fatty Acids (FISH OIL PO), Take 1,400 mg by mouth daily., Disp: , Rfl:    rosuvastatin (CRESTOR) 20 MG tablet, TAKE ONE TABLET BY MOUTH AT BEDTIME, Disp: 90 tablet, Rfl: 0   azelastine (ASTELIN) 0.1 % nasal spray, as needed. (Patient not taking: Reported on 03/13/2023), Disp: , Rfl:    clonazePAM  (KLONOPIN) 0.5 MG tablet, Take 0.25 mg by mouth at bedtime. (Patient not taking: Reported on 03/13/2023), Disp: , Rfl:    cloNIDine (CATAPRES) 0.1 MG tablet, TAKE 1 TABLET BY MOUTH EVERY DAY (Patient not taking: Reported on 03/13/2023), Disp: 90 tablet, Rfl: 3   nitroGLYCERIN (NITROSTAT) 0.4 MG SL tablet, Place 0.4 mg under the tongue every 5 (five) minutes as needed for chest pain. (Patient not taking: Reported on 09/11/2022), Disp: , Rfl:    predniSONE (DELTASONE) 50 MG tablet, Take (1) 13 hrs, 7hrs and 1hr prior to CT scan, Disp: 3 tablet, Rfl: 0   prochlorperazine (COMPAZINE) 10 MG tablet, Take by mouth. (Patient not taking: Reported on 09/11/2022), Disp: , Rfl:    Allergies: Allergies  Allergen Reactions   Cefixime Shortness Of Breath    Patient is not familiar with this listed allergy   Conjugated Estrogens Other (See Comments)    REACTION: flushing   Morphine And Related Other (See Comments)    Morphine injection Neck pain   Codeine Other (See Comments)    REACTION: dizziness   Iohexol      Desc: pt has anxiety and an irregular heartbeat. contrast exacerbates this. pt was very uncomfortable after a 20cc bolus for an miroi. we did w/o iv 11/09 per dr. Eppie Gibson. pt will talk w/ md about this and will probably not get iv contrast in the future at her re, Onset Date: [id number] Multi CT's with contrast done at [facility] without pre meds, Heart Cath without pre med documented 2021. CT A/P with contrast without premeds 11/2020 without issues     Adenosine Other (See Comments)    Elevated heart rate when doing stress test    REVIEW OF SYSTEMS:   Review of Systems  Constitutional:  Negative for chills, fatigue and fever.  HENT:   Negative for lump/mass, mouth sores, nosebleeds, sore throat and trouble swallowing.   Eyes:  Negative for eye problems.  Respiratory:  Negative for cough and shortness of breath.   Cardiovascular:  Negative for chest pain, leg swelling and palpitations.  Gastrointestinal:   Negative for abdominal pain, constipation, diarrhea, nausea and vomiting.  Genitourinary:  Negative for bladder incontinence, difficulty urinating, dysuria, frequency, hematuria and nocturia.   Musculoskeletal:  Negative for arthralgias, back pain, flank pain, myalgias and neck pain.  Skin:  Negative for itching and rash.  Neurological:  Positive for headaches. Negative for numbness.  Hematological:  Does not bruise/bleed easily.  Psychiatric/Behavioral:  Negative for depression, sleep disturbance and suicidal ideas. The patient is not nervous/anxious.   All other systems reviewed and are negative.    VITALS:   Blood pressure (!) 180/77, pulse 73, temperature 98.3 F (36.8 C), temperature source Oral, resp. rate 18, weight 127 lb 9.6 oz (57.9 kg), SpO2 100 %.  Wt Readings from Last 3 Encounters:  03/13/23 127 lb 9.6 oz (57.9 kg)  09/11/22 126 lb 11.2 oz (57.5  kg)  06/07/22 126 lb 12.8 oz (57.5 kg)    Body mass index is 23.34 kg/m.  Performance status (ECOG): 1 - Symptomatic but completely ambulatory  PHYSICAL EXAM:   Physical Exam Vitals and nursing note reviewed. Exam conducted with a chaperone present.  Constitutional:      Appearance: Normal appearance.  Cardiovascular:     Rate and Rhythm: Normal rate and regular rhythm.     Pulses: Normal pulses.     Heart sounds: Normal heart sounds.  Pulmonary:     Effort: Pulmonary effort is normal.     Breath sounds: Normal breath sounds.  Abdominal:     Palpations: Abdomen is soft. There is no hepatomegaly, splenomegaly or mass.     Tenderness: There is no abdominal tenderness.  Musculoskeletal:     Right lower leg: No edema.     Left lower leg: No edema.  Lymphadenopathy:     Cervical: No cervical adenopathy.     Right cervical: No superficial, deep or posterior cervical adenopathy.    Left cervical: No superficial, deep or posterior cervical adenopathy.     Upper Body:     Right upper body: No supraclavicular or axillary  adenopathy.     Left upper body: No supraclavicular or axillary adenopathy.  Neurological:     General: No focal deficit present.     Mental Status: She is alert and oriented to person, place, and time.  Psychiatric:        Mood and Affect: Mood normal.        Behavior: Behavior normal.     LABS:      Latest Ref Rng & Units 03/06/2023    1:44 PM 09/05/2022    2:47 PM 06/07/2022   12:14 PM  CBC  WBC 4.0 - 10.5 K/uL 3.5  2.9  2.8   Hemoglobin 12.0 - 15.0 g/dL 16.1  09.6  04.5   Hematocrit 36.0 - 46.0 % 44.0  40.4  40.1   Platelets 150 - 400 K/uL 205  176  184       Latest Ref Rng & Units 03/06/2023    2:36 PM 03/06/2023    1:44 PM 09/05/2022    2:47 PM  CMP  Glucose 70 - 99 mg/dL  409  811   BUN 8 - 23 mg/dL  24  19   Creatinine 9.14 - 1.00 mg/dL 7.82  9.56  2.13   Sodium 135 - 145 mmol/L  134  138   Potassium 3.5 - 5.1 mmol/L  4.0  3.9   Chloride 98 - 111 mmol/L  98  101   CO2 22 - 32 mmol/L  26  29   Calcium 8.9 - 10.3 mg/dL  08.6  57.8   Total Protein 6.5 - 8.1 g/dL  7.5  7.2   Total Bilirubin 0.3 - 1.2 mg/dL  0.8  0.9   Alkaline Phos 38 - 126 U/L  98  88   AST 15 - 41 U/L  27  27   ALT 0 - 44 U/L  25  27      Lab Results  Component Value Date   CEA1 1.9 03/06/2023   /  CEA  Date Value Ref Range Status  03/06/2023 1.9 0.0 - 4.7 ng/mL Final    Comment:    (NOTE)                             Nonsmokers          <  3.9                             Smokers             <5.6 [employer] Electrochemiluminescence Immunoassay (ECLIA) Values obtained with different assay methods or kits cannot be used interchangeably.  Results cannot be interpreted as absolute evidence of the presence or absence of malignant disease. Performed At: [facility] [address] [place], [place] [id number] Jolene Schimke MD [id number]    No results found for: "PSA1" No results found for: "[id number]" No results found for: "CAN125"  No results found for: "TOTALPROTELP",  "ALBUMINELP", "A1GS", "A2GS", "BETS", "BETA2SER", "GAMS", "MSPIKE", "SPEI" Lab Results  Component Value Date   TIBC 414 06/07/2022   TIBC 424 11/16/2021   TIBC 400 08/02/2021   FERRITIN 128 06/07/2022   FERRITIN 130 11/16/2021   FERRITIN 125 08/02/2021   IRONPCTSAT 24 06/07/2022   IRONPCTSAT 16 11/16/2021   IRONPCTSAT 25 08/02/2021   No results found for: "LDH"   STUDIES:   CT Abdomen Pelvis W Contrast  Result Date: 03/11/2023 CLINICAL DATA:  Follow-up colon cancer, status post right hemicolectomy EXAM: CT ABDOMEN AND PELVIS WITH CONTRAST TECHNIQUE: Multidetector CT imaging of the abdomen and pelvis was performed using the standard protocol following bolus administration of intravenous contrast. RADIATION DOSE REDUCTION: This exam was performed according to the departmental dose-optimization program which includes automated exposure control, adjustment of the mA and/or kV according to patient size and/or use of iterative reconstruction technique. CONTRAST:  OMNIPAQUE IOHEXOL 300 MG/ML  SOLN COMPARISON:  05/31/2022 FINDINGS: Lower chest: Lung bases are clear. Hepatobiliary: Liver is within normal limits. Gallbladder is unremarkable. No intrahepatic or extrahepatic duct dilatation. Pancreas: Within normal limits. Spleen: Within normal limits. Adrenals/Urinary Tract: Adrenal glands are within normal limits. Right renal cysts, measuring up to 2.5 cm in the lower pole (series 3/image 24), benign (Bosniak I). No follow-up is recommended. Stable left renal cortical scarring with dystrophic calcifications (series 2/image 31). No hydronephrosis. Bladder is within normal limits. Stomach/Bowel: Stomach is within normal limits. Status post right hemicolectomy with appendectomy. Mild sigmoid diverticulosis, without evidence of diverticulitis. Vascular/Lymphatic: No evidence of abdominal aortic aneurysm. Aorto bi-iliac stents. Atherosclerotic calcifications of the abdominal aorta and branch vessels. No  suspicious abdominopelvic lymphadenopathy. Reproductive: Status post hysterectomy. No adnexal masses. Other: No abdominopelvic ascites. Musculoskeletal: Degenerative changes of the visualized thoracolumbar spine. IMPRESSION: Status post right hemicolectomy. No evidence of recurrent or metastatic disease. Additional stable ancillary findings as above. Electronically Signed   By: Charline Bills M.D.   On: 03/11/2023 01:56

## 2023-03-13 ENCOUNTER — Inpatient Hospital Stay: Payer: Medicare Other | Attending: Hematology | Admitting: Hematology

## 2023-03-13 ENCOUNTER — Encounter: Payer: Self-pay | Admitting: Hematology

## 2023-03-13 VITALS — BP 180/77 | HR 73 | Temp 98.3°F | Resp 18 | Wt 127.6 lb

## 2023-03-13 DIAGNOSIS — C182 Malignant neoplasm of ascending colon: Secondary | ICD-10-CM

## 2023-03-13 DIAGNOSIS — Z08 Encounter for follow-up examination after completed treatment for malignant neoplasm: Secondary | ICD-10-CM | POA: Insufficient documentation

## 2023-03-13 DIAGNOSIS — Z85038 Personal history of other malignant neoplasm of large intestine: Secondary | ICD-10-CM | POA: Diagnosis not present

## 2023-03-13 MED ORDER — PREDNISONE 50 MG PO TABS: ORAL_TABLET | ORAL | 0 refills | Status: DC

## 2023-03-13 NOTE — Patient Instructions (Addendum)
[facility]  Discharge Instructions  You were seen and examined today by Dr. Ellin Saba.  Dr. Ellin Saba discussed your most recent lab work and CT scan which revealed that your calcium is slightly elevated.   Follow up with your primary care doctor about your calcium being slightly elevated.  Follow-up as scheduled in 6 months.    Thank you for choosing  [facility] to provide your oncology and hematology care.   To afford each patient quality time with our provider, please arrive at least 15 minutes before your scheduled appointment time. You may need to reschedule your appointment if you arrive late (10 or more minutes). Arriving late affects you and other patients whose appointments are after yours.  Also, if you miss three or more appointments without notifying the office, you may be dismissed from the clinic at the provider's discretion.    Again, thank you for choosing [facility].  Our hope is that these requests will decrease the amount of time that you wait before being seen by our physicians.   If you have a lab appointment with the Cancer Center - please note that after April 8th, all labs will be drawn in the cancer center.  You do not have to check in or register with the main entrance as you have in the past but will complete your check-in at the cancer center.            _____________________________________________________________  Should you have questions after your visit to [facility], please contact our office at [phone] and follow the prompts.  Our office hours are 8:00 a.m. to 4:30 p.m. Monday - Thursday and 8:00 a.m. to 2:30 p.m. Friday.  Please note that voicemails left after 4:00 p.m. may not be returned until the following business day.  We are closed weekends and all major holidays.  You do have access to a nurse 24-7, just call the main number to the clinic [phone] and do  not press any options, hold on the line and a nurse will answer the phone.    For prescription refill requests, have your pharmacy contact our office and allow 72 hours.    Masks are no longer required in the cancer centers. If you would like for your care team to wear a mask while they are taking care of you, please let them know. You may have one support person who is at least 85 years old accompany you for your appointments.

## 2023-04-01 DIAGNOSIS — H35722 Serous detachment of retinal pigment epithelium, left eye: Secondary | ICD-10-CM | POA: Diagnosis not present

## 2023-04-01 DIAGNOSIS — H353221 Exudative age-related macular degeneration, left eye, with active choroidal neovascularization: Secondary | ICD-10-CM | POA: Diagnosis not present

## 2023-04-01 DIAGNOSIS — H353132 Nonexudative age-related macular degeneration, bilateral, intermediate dry stage: Secondary | ICD-10-CM | POA: Diagnosis not present

## 2023-04-01 DIAGNOSIS — H02054 Trichiasis without entropian left upper eyelid: Secondary | ICD-10-CM | POA: Diagnosis not present

## 2023-04-04 DIAGNOSIS — Z299 Encounter for prophylactic measures, unspecified: Secondary | ICD-10-CM | POA: Diagnosis not present

## 2023-04-04 DIAGNOSIS — F419 Anxiety disorder, unspecified: Secondary | ICD-10-CM | POA: Diagnosis not present

## 2023-04-04 DIAGNOSIS — E1159 Type 2 diabetes mellitus with other circulatory complications: Secondary | ICD-10-CM | POA: Diagnosis not present

## 2023-04-04 DIAGNOSIS — I152 Hypertension secondary to endocrine disorders: Secondary | ICD-10-CM | POA: Diagnosis not present

## 2023-04-04 DIAGNOSIS — I10 Essential (primary) hypertension: Secondary | ICD-10-CM | POA: Diagnosis not present

## 2023-04-05 ENCOUNTER — Other Ambulatory Visit: Payer: Self-pay | Admitting: Cardiology

## 2023-04-12 DIAGNOSIS — I10 Essential (primary) hypertension: Secondary | ICD-10-CM | POA: Diagnosis not present

## 2023-05-12 DIAGNOSIS — I10 Essential (primary) hypertension: Secondary | ICD-10-CM | POA: Diagnosis not present

## 2023-05-13 DIAGNOSIS — H02054 Trichiasis without entropian left upper eyelid: Secondary | ICD-10-CM | POA: Diagnosis not present

## 2023-05-13 DIAGNOSIS — H35722 Serous detachment of retinal pigment epithelium, left eye: Secondary | ICD-10-CM | POA: Diagnosis not present

## 2023-05-13 DIAGNOSIS — H353221 Exudative age-related macular degeneration, left eye, with active choroidal neovascularization: Secondary | ICD-10-CM | POA: Diagnosis not present

## 2023-05-13 DIAGNOSIS — H353132 Nonexudative age-related macular degeneration, bilateral, intermediate dry stage: Secondary | ICD-10-CM | POA: Diagnosis not present

## 2023-05-14 NOTE — Progress Notes (Unsigned)
Cardiology Office Note  Date: 05/15/2023   ID: [name], DOB [birth year], MRN [id number]  History of Present Illness: [name] is an 87 y.o. female last seen in June 2023.  She is here for a follow-up visit.  Reports no progressive angina with typical activities, no increasing dyspnea on exertion.  Complains of headache, also feels poorly after she takes her morning metoprolol.  We have discussed potential changes in her medications, however she wants to keep things the same for now.  I did review her home blood pressure checks which on average look better than office visit checks.  Blood pressure was initially elevated in the 190s systolic when she came in, rechecked by me at 152/88.  I went over her medications.  She reports compliance with Norvasc and metoprolol.  Does not take clonidine with any regularity.  She is also on Imdur and Crestor.  Her last LDL was 73.  ECG today shows sinus rhythm with left atrial enlargement and nonspecific ST-T changes, decreased R wave progression.  Physical Exam: VS:  BP (!) 152/88   Pulse 66   Ht 5\' 2"  (1.575 m)   Wt 125 lb 3.2 oz (56.8 kg)   SpO2 95%   BMI 22.90 kg/m , BMI Body mass index is 22.9 kg/m.  Wt Readings from Last 3 Encounters:  05/15/23 125 lb 3.2 oz (56.8 kg)  03/13/23 127 lb 9.6 oz (57.9 kg)  09/11/22 126 lb 11.2 oz (57.5 kg)    General: Patient appears comfortable at rest. HEENT: Conjunctiva and lids normal. Neck: Supple, no elevated JVP or carotid bruits. Lungs: Clear to auscultation, nonlabored breathing at rest. Cardiac: Regular rate and rhythm, no S3, 1/6 systolic murmur. Extremities: No pitting edema.  ECG:  An ECG dated 10/18/2021 was personally reviewed today and demonstrated:  Sinus rhythm with left atrial enlargement and old anterior infarct pattern.  Labwork: 03/06/2023: ALT 25; AST 27; BUN 24; Creatinine, Ser 0.80; Hemoglobin 14.4; Platelets 205; Potassium 4.0; Sodium 134  July 2022: Cholesterol 151,  triglycerides 131, HDL 55, LDL 73  Other Studies Reviewed Today:  No interval cardiac testing for review today.  Assessment and Plan:  1.  Multivessel CAD status post CABG in April 2021 with LIMA to LAD, SVG to diagonal, and SVG to RCA.  Follow-up Lexiscan Myoview in October 2021 was low risk indicating possible small inferior scar but no ischemia and LVEF greater than 65%.  Plan to continue medical therapy and observation for now in the absence of accelerating angina.  Continue aspirin, Imdur, Lopressor, Crestor, and as needed nitroglycerin.  2.  Essential hypertension.  Continue to track blood pressure at home.  I did discuss adjustments in her medications, could consider reducing Lopressor to 25 mg twice daily, increase Norvasc to 7.5 mg daily, and depending on subsequent blood pressure consider use of clonidine on a regular basis.  For now she did not want to make any specific changes.  3.  PAD.  History of infrarenal abdominal aortic penetrating ulcer status post stent graft in 2009.  Also history of renal artery stenosis status post right renal artery stent intervention in 2013 and subsequent intervention to same vessel in 2015 by Dr. Kirke Corin.  4.  Mixed hyperlipidemia.  LDL 73 in July 2022.  5.  Asymptomatic carotid artery disease, mild to moderate by carotid Dopplers in 2021.  We will arrange a follow-up study.  Disposition:  Follow up  6 months.  Signed, Jonelle Sidle, M.D., F.A.C.C.  [facility] at [facility]

## 2023-05-15 ENCOUNTER — Encounter: Payer: Self-pay | Admitting: Cardiology

## 2023-05-15 ENCOUNTER — Telehealth: Payer: Self-pay | Admitting: Cardiology

## 2023-05-15 ENCOUNTER — Ambulatory Visit: Payer: Medicare Other | Attending: Cardiology | Admitting: Cardiology

## 2023-05-15 VITALS — BP 152/88 | HR 66 | Ht 62.0 in | Wt 125.2 lb

## 2023-05-15 DIAGNOSIS — I6523 Occlusion and stenosis of bilateral carotid arteries: Secondary | ICD-10-CM | POA: Diagnosis not present

## 2023-05-15 DIAGNOSIS — E782 Mixed hyperlipidemia: Secondary | ICD-10-CM

## 2023-05-15 DIAGNOSIS — I10 Essential (primary) hypertension: Secondary | ICD-10-CM | POA: Diagnosis not present

## 2023-05-15 DIAGNOSIS — I25119 Atherosclerotic heart disease of native coronary artery with unspecified angina pectoris: Secondary | ICD-10-CM

## 2023-05-15 MED ORDER — ROSUVASTATIN CALCIUM 20 MG PO TABS: 20.0000 mg | ORAL_TABLET | Freq: Every day | ORAL | 3 refills | Status: DC

## 2023-05-15 MED ORDER — ISOSORBIDE MONONITRATE ER 30 MG PO TB24: ORAL_TABLET | ORAL | 3 refills | Status: DC

## 2023-05-15 MED ORDER — METOPROLOL TARTRATE 50 MG PO TABS: 50.0000 mg | ORAL_TABLET | Freq: Two times a day (BID) | ORAL | 3 refills | Status: DC

## 2023-05-15 MED ORDER — AMLODIPINE BESYLATE 5 MG PO TABS: 5.0000 mg | ORAL_TABLET | Freq: Every day | ORAL | 3 refills | Status: DC

## 2023-05-15 NOTE — Patient Instructions (Addendum)
Medication Instructions:  ?Your physician recommends that you continue on your current medications as directed. Please refer to the Current Medication list given to you today. ? ?Labwork: ?none ? ?Testing/Procedures: ?Your physician has requested that you have a carotid duplex. This test is an ultrasound of the carotid arteries in your neck. It looks at blood flow through these arteries that supply the brain with blood. Allow one hour for this exam. There are no restrictions or special instructions.  ? ?Follow-Up: ?Your physician recommends that you schedule a follow-up appointment in: 6 months ? ?Any Other Special Instructions Will Be Listed Below (If Applicable). ? ?If you need a refill on your cardiac medications before your next appointment, please call your pharmacy. ? ?

## 2023-05-15 NOTE — Telephone Encounter (Signed)
Patient requested to have her medications from Dr. Diona Browner a 90 day supply for the future.

## 2023-05-15 NOTE — Telephone Encounter (Signed)
Noted and sent.  

## 2023-05-22 ENCOUNTER — Ambulatory Visit: Payer: Medicare Other | Attending: Cardiology

## 2023-05-22 DIAGNOSIS — I6523 Occlusion and stenosis of bilateral carotid arteries: Secondary | ICD-10-CM | POA: Insufficient documentation

## 2023-05-28 ENCOUNTER — Ambulatory Visit: Payer: Medicare Other | Admitting: Nurse Practitioner

## 2023-06-12 DIAGNOSIS — Z299 Encounter for prophylactic measures, unspecified: Secondary | ICD-10-CM | POA: Diagnosis not present

## 2023-06-12 DIAGNOSIS — Z1331 Encounter for screening for depression: Secondary | ICD-10-CM | POA: Diagnosis not present

## 2023-06-12 DIAGNOSIS — Z79899 Other long term (current) drug therapy: Secondary | ICD-10-CM | POA: Diagnosis not present

## 2023-06-12 DIAGNOSIS — Z1339 Encounter for screening examination for other mental health and behavioral disorders: Secondary | ICD-10-CM | POA: Diagnosis not present

## 2023-06-12 DIAGNOSIS — Z0000 Encounter for general adult medical examination without abnormal findings: Secondary | ICD-10-CM | POA: Diagnosis not present

## 2023-06-12 DIAGNOSIS — Z7189 Other specified counseling: Secondary | ICD-10-CM | POA: Diagnosis not present

## 2023-06-12 DIAGNOSIS — E7800 Pure hypercholesterolemia, unspecified: Secondary | ICD-10-CM | POA: Diagnosis not present

## 2023-06-12 DIAGNOSIS — R5383 Other fatigue: Secondary | ICD-10-CM | POA: Diagnosis not present

## 2023-06-12 DIAGNOSIS — I10 Essential (primary) hypertension: Secondary | ICD-10-CM | POA: Diagnosis not present

## 2023-06-17 DIAGNOSIS — H353132 Nonexudative age-related macular degeneration, bilateral, intermediate dry stage: Secondary | ICD-10-CM | POA: Diagnosis not present

## 2023-06-17 DIAGNOSIS — H02054 Trichiasis without entropian left upper eyelid: Secondary | ICD-10-CM | POA: Diagnosis not present

## 2023-06-17 DIAGNOSIS — H353221 Exudative age-related macular degeneration, left eye, with active choroidal neovascularization: Secondary | ICD-10-CM | POA: Diagnosis not present

## 2023-06-17 DIAGNOSIS — H35722 Serous detachment of retinal pigment epithelium, left eye: Secondary | ICD-10-CM | POA: Diagnosis not present

## 2023-06-19 DIAGNOSIS — I10 Essential (primary) hypertension: Secondary | ICD-10-CM | POA: Diagnosis not present

## 2023-06-19 DIAGNOSIS — M25551 Pain in right hip: Secondary | ICD-10-CM | POA: Diagnosis not present

## 2023-06-19 DIAGNOSIS — Z299 Encounter for prophylactic measures, unspecified: Secondary | ICD-10-CM | POA: Diagnosis not present

## 2023-06-27 DIAGNOSIS — D649 Anemia, unspecified: Secondary | ICD-10-CM | POA: Diagnosis not present

## 2023-06-28 DIAGNOSIS — Z299 Encounter for prophylactic measures, unspecified: Secondary | ICD-10-CM | POA: Diagnosis not present

## 2023-06-28 DIAGNOSIS — D649 Anemia, unspecified: Secondary | ICD-10-CM | POA: Diagnosis not present

## 2023-06-28 DIAGNOSIS — D709 Neutropenia, unspecified: Secondary | ICD-10-CM | POA: Diagnosis not present

## 2023-06-28 DIAGNOSIS — I10 Essential (primary) hypertension: Secondary | ICD-10-CM | POA: Diagnosis not present

## 2023-07-13 DIAGNOSIS — I10 Essential (primary) hypertension: Secondary | ICD-10-CM | POA: Diagnosis not present

## 2023-07-16 DIAGNOSIS — I10 Essential (primary) hypertension: Secondary | ICD-10-CM | POA: Diagnosis not present

## 2023-07-16 DIAGNOSIS — Z299 Encounter for prophylactic measures, unspecified: Secondary | ICD-10-CM | POA: Diagnosis not present

## 2023-07-16 DIAGNOSIS — R519 Headache, unspecified: Secondary | ICD-10-CM | POA: Diagnosis not present

## 2023-07-16 DIAGNOSIS — F419 Anxiety disorder, unspecified: Secondary | ICD-10-CM | POA: Diagnosis not present

## 2023-07-16 DIAGNOSIS — R5383 Other fatigue: Secondary | ICD-10-CM | POA: Diagnosis not present

## 2023-07-31 DIAGNOSIS — H353132 Nonexudative age-related macular degeneration, bilateral, intermediate dry stage: Secondary | ICD-10-CM | POA: Diagnosis not present

## 2023-07-31 DIAGNOSIS — H35722 Serous detachment of retinal pigment epithelium, left eye: Secondary | ICD-10-CM | POA: Diagnosis not present

## 2023-07-31 DIAGNOSIS — H353221 Exudative age-related macular degeneration, left eye, with active choroidal neovascularization: Secondary | ICD-10-CM | POA: Diagnosis not present

## 2023-07-31 DIAGNOSIS — H02054 Trichiasis without entropian left upper eyelid: Secondary | ICD-10-CM | POA: Diagnosis not present

## 2023-08-13 ENCOUNTER — Other Ambulatory Visit (HOSPITAL_COMMUNITY): Payer: Self-pay

## 2023-08-13 ENCOUNTER — Telehealth: Payer: Self-pay | Admitting: Cardiology

## 2023-08-13 MED ORDER — CLONIDINE HCL 0.1 MG PO TABS
0.0500 mg | ORAL_TABLET | Freq: Two times a day (BID) | ORAL | 1 refills | Status: DC
  Filled 2023-08-13: qty 90, 90d supply, fill #0

## 2023-08-13 NOTE — Telephone Encounter (Signed)
Okay patient has agreed to try this she follows up with BS later this week I advised her to bring BP monitor to be calibrated to that appointment to make sure it is accurate

## 2023-08-13 NOTE — Telephone Encounter (Signed)
Spoke with patient she states this has been going on since Sunday see the below phone call message for BP readings  Patient sounds extremely SOB on the phone while talking  She states she has a severe headache that has lasted since Sunday  Last BP reading at 3:20 was 147/75  Patient feels she needs meds to be adjusted Advised patient if her symptoms progress please reach out to [place] if she has not heard back from [place] or if we are not avaliable proceed to the ED   She states she has been taking her medication as prescribed

## 2023-08-13 NOTE — Addendum Note (Signed)
Addended by: Sharen Hones on: 08/13/2023 04:49 PM   Modules accepted: Orders

## 2023-08-13 NOTE — Telephone Encounter (Signed)
Patient states she took 2 doses on Sunday  No clonidine yesterday(Monday) And so far today she took 1 dose this morning when she took her Metoprolol

## 2023-08-13 NOTE — Telephone Encounter (Signed)
Pt c/o BP issue: STAT if pt c/o blurred vision, one-sided weakness or slurred speech  1. What are your last 5 BP readings?  9/29: 350+/??, 204/94, 170/88 10/01: 160/??, 218/107, 173/95, 170/88  2. Are you having any other symptoms (ex. Dizziness, headache, blurred vision, passed out)?  Headaches, weakness, SOB  3. What is your BP issue?  BP is elevated. Systolic is remaining in the 200's. Patient assumes her medication may need to be adjusted.

## 2023-08-13 NOTE — Telephone Encounter (Signed)
Patient states she is willing to try that but she wants to cut her current dose in half and take a half in the AM and half in the PM due to when she takes this medication it makes her extremely tired. She id say she was comfortable taking 1 pill of what she has now each night if she cannot cut in half and split but she feels 2 whole tablets is too much. Her BP at 3:40 was 173/83

## 2023-08-14 ENCOUNTER — Encounter (HOSPITAL_COMMUNITY): Payer: Self-pay | Admitting: *Deleted

## 2023-08-14 ENCOUNTER — Other Ambulatory Visit: Payer: Self-pay

## 2023-08-14 ENCOUNTER — Emergency Department (HOSPITAL_COMMUNITY): Payer: Medicare Other

## 2023-08-14 ENCOUNTER — Emergency Department (HOSPITAL_COMMUNITY)
Admission: EM | Admit: 2023-08-14 | Discharge: 2023-08-15 | Disposition: A | Discharge status: Home / Self Care | Payer: Medicare Other | Attending: Emergency Medicine | Admitting: Emergency Medicine

## 2023-08-14 DIAGNOSIS — Z951 Presence of aortocoronary bypass graft: Secondary | ICD-10-CM | POA: Diagnosis not present

## 2023-08-14 DIAGNOSIS — I160 Hypertensive urgency: Secondary | ICD-10-CM | POA: Diagnosis not present

## 2023-08-14 DIAGNOSIS — E782 Mixed hyperlipidemia: Secondary | ICD-10-CM | POA: Insufficient documentation

## 2023-08-14 DIAGNOSIS — I2510 Atherosclerotic heart disease of native coronary artery without angina pectoris: Secondary | ICD-10-CM | POA: Insufficient documentation

## 2023-08-14 DIAGNOSIS — R079 Chest pain, unspecified: Secondary | ICD-10-CM | POA: Diagnosis not present

## 2023-08-14 DIAGNOSIS — I10 Essential (primary) hypertension: Secondary | ICD-10-CM | POA: Insufficient documentation

## 2023-08-14 DIAGNOSIS — Z7982 Long term (current) use of aspirin: Secondary | ICD-10-CM | POA: Diagnosis not present

## 2023-08-14 DIAGNOSIS — Z79899 Other long term (current) drug therapy: Secondary | ICD-10-CM | POA: Insufficient documentation

## 2023-08-14 DIAGNOSIS — I739 Peripheral vascular disease, unspecified: Secondary | ICD-10-CM | POA: Diagnosis not present

## 2023-08-14 DIAGNOSIS — E871 Hypo-osmolality and hyponatremia: Secondary | ICD-10-CM | POA: Diagnosis not present

## 2023-08-14 LAB — BASIC METABOLIC PANEL
Anion gap: 10 (ref 5–15)
BUN: 21 mg/dL (ref 8–23)
CO2: 27 mmol/L (ref 22–32)
Calcium: 10.6 mg/dL — ABNORMAL HIGH (ref 8.9–10.3)
Chloride: 93 mmol/L — ABNORMAL LOW (ref 98–111)
Creatinine, Ser: 0.83 mg/dL (ref 0.44–1.00)
GFR, Estimated: >60 mL/min (ref >60)
Glucose, Bld: 129 mg/dL — ABNORMAL HIGH (ref 70–99)
Potassium: 3.8 mmol/L (ref 3.5–5.1)
Sodium: 130 mmol/L — ABNORMAL LOW (ref 135–145)

## 2023-08-14 LAB — CBC
HCT: 42 % (ref 36.0–46.0)
Hemoglobin: 14.3 g/dL (ref 12.0–15.0)
MCH: 29.1 pg (ref 26.0–34.0)
MCHC: 34 g/dL (ref 30.0–36.0)
MCV: 85.5 fL (ref 80.0–100.0)
Platelets: 202 10*3/uL (ref 150–400)
RBC: 4.91 MIL/uL (ref 3.87–5.11)
RDW: 14.2 % (ref 11.5–15.5)
WBC: 2.7 10*3/uL — ABNORMAL LOW (ref 4.0–10.5)
nRBC: 0 % (ref 0.0–0.2)

## 2023-08-14 LAB — TROPONIN I (HIGH SENSITIVITY)
Troponin I (High Sensitivity): 7 ng/L (ref <18)
Troponin I (High Sensitivity): 7 ng/L (ref <18)

## 2023-08-14 NOTE — Telephone Encounter (Signed)
Pt called back to speak with nurse

## 2023-08-14 NOTE — ED Triage Notes (Signed)
Pt with elevated BP at home, mild chest pain and HA. Bp at home 160/81. Denies CP at present

## 2023-08-14 NOTE — Telephone Encounter (Signed)
240/110 this morning after taking clonidine her BP 146/80, Patient sounds very weak and SOB on the phone patient states she is not feeling well at all. Patient stated she feels like she needs to go to [facility]. I advised her to do so with the way she is feeling and her BP. Patient is having someone take her or will call EMS

## 2023-08-14 NOTE — ED Provider Notes (Signed)
[facility] AT [facility] Provider Note   CSN: [id number] Arrival date & time: 08/14/23  1559     History  Chief Complaint  Patient presents with   Hypertension   Chest Pain    [name] is a 87 y.o. female.  HPI Patient presents with her daughter who assists with and clarifies the history.  In essence the patient has a history of labile blood pressure, as well as multiple prior similar episodes to that which brings her here tonight. Today she presents with concern of 4 days of discomfort, occasional chest pain, lightheadedness, sensation of palpations in her neck.  She spoke with her cardiology team, was advised to take clonidine, half a tablet twice daily until follow-up which is arranged for tomorrow morning.  She has not been doing so due to concerns, she voices, about possibly dropping her blood pressure too low. No fever, vomiting, chills, syncope, abdominal pain.    Home Medications Prior to Admission medications   Medication Sig Start Date End Date Taking? Authorizing Provider  acetaminophen (TYLENOL) 325 MG tablet Take 325 mg by mouth every 4 (four) hours as needed for moderate pain. 11/20/12  Yes [provider]  amLODipine (NORVASC) 5 MG tablet Take 1 tablet (5 mg total) by mouth at bedtime. 05/15/23  Yes Jonelle Sidle, MD  aspirin EC 81 MG tablet Take 81 mg by mouth daily. Swallow whole.   Yes [provider]  clonazePAM (KLONOPIN) 0.5 MG tablet Take 0.25 mg by mouth at bedtime. 10/27/21  Yes [provider]  cloNIDine (CATAPRES) 0.1 MG tablet Take 1/2 tablet by mouth twice daily Patient taking differently: Take 0.05 mg by mouth as needed (high blood pressure). 08/13/23  Yes Jonelle Sidle, MD  docusate sodium (COLACE) 100 MG capsule Take 100 mg by mouth in the morning and at bedtime.   Yes [provider]  isosorbide mononitrate (IMDUR) 30 MG 24 hr tablet TAKE 0.5 TABLETS BY MOUTH EVERY DAY  05/15/23  Yes Jonelle Sidle, MD  metoprolol tartrate (LOPRESSOR) 50 MG tablet Take 1 tablet (50 mg total) by mouth 2 (two) times daily. 05/15/23  Yes Jonelle Sidle, MD  Multiple Vitamins-Minerals (PRESERVISION AREDS 2 PO) Take 1 tablet by mouth in the morning and at bedtime.   Yes [provider]  nitroGLYCERIN (NITROSTAT) 0.4 MG SL tablet Place 0.4 mg under the tongue every 5 (five) minutes x 3 doses as needed for chest pain (if no relief after 3rd dose, proceed to ED or call 911).   Yes [provider]  Omega-3 Fatty Acids (FISH OIL PO) Take 1,400 mg by mouth daily.   Yes [provider]  rosuvastatin (CRESTOR) 20 MG tablet Take 1 tablet (20 mg total) by mouth at bedtime. 05/15/23  Yes Jonelle Sidle, MD      Allergies    Cefixime, Conjugated estrogens, Morphine and codeine, Codeine, Iohexol, and Adenosine    Review of Systems   Review of Systems  All other systems reviewed and are negative.   Physical Exam Updated Vital Signs BP (!) 187/93 (BP Location: Right Arm)   Pulse 65   Temp 97.9 F (36.6 C) (Oral)   Resp 16   Ht 5\' 2"  (1.575 m)   Wt 56.7 kg   SpO2 97%   BMI 22.86 kg/m  Physical Exam Vitals and nursing note reviewed.  Constitutional:      General: She is not in acute distress.  Appearance: She is well-developed.  HENT:     Head: Normocephalic and atraumatic.  Eyes:     Conjunctiva/sclera: Conjunctivae normal.  Cardiovascular:     Rate and Rhythm: Normal rate and regular rhythm.  Pulmonary:     Effort: Pulmonary effort is normal. No respiratory distress.     Breath sounds: Normal breath sounds. No stridor.  Abdominal:     General: There is no distension.  Skin:    General: Skin is warm and dry.  Neurological:     Mental Status: She is alert and oriented to person, place, and time.     Cranial Nerves: No cranial nerve deficit.  Psychiatric:        Mood and Affect: Mood normal.     ED Results / Procedures / Treatments    Labs (all labs ordered are listed, but only abnormal results are displayed) Labs Reviewed  BASIC METABOLIC PANEL - Abnormal; Notable for the following components:      Result Value   Sodium 130 (*)    Chloride 93 (*)    Glucose, Bld 129 (*)    Calcium 10.6 (*)    All other components within normal limits  CBC - Abnormal; Notable for the following components:   WBC 2.7 (*)    All other components within normal limits  TROPONIN I (HIGH SENSITIVITY)  TROPONIN I (HIGH SENSITIVITY)    EKG EKG Interpretation Date/Time:  Wednesday August 14 2023 16:23:40 EDT Ventricular Rate:  63 PR Interval:  192 QRS Duration:  92 QT Interval:  364 QTC Calculation: 372 R Axis:   82  Text Interpretation: Normal sinus rhythm Possible Left atrial enlargement Artifact Abnormal ECG Confirmed by Gerhard Munch [phone]) on 08/14/2023 4:30:15 PM  Radiology DG Chest 2 View  Result Date: 08/14/2023 CLINICAL DATA:  Chest pain for 4 days. EXAM: CHEST - 2 VIEW COMPARISON:  Apr 07, 2020. FINDINGS: The heart size and mediastinal contours are within normal limits. Both lungs are clear. Status post coronary artery bypass graft. The visualized skeletal structures are unremarkable. IMPRESSION: No active cardiopulmonary disease. Electronically Signed   By: Lupita Raider M.D.   On: 08/14/2023 16:54    Procedures Procedures    Medications Ordered in ED Medications - No data to display  ED Course/ Medical Decision Making/ A&P                                 Medical Decision Making Adult female with history of anxiety, CABG, thus elevated ACS concern presents with intermittent chest pain, palpations, hypertension over days in the context of prior similar episodes. Concern for ACS, pneumonia, bacteremia, sepsis, electrolyte abnormalities, hypertensive crisis.   Amount and/or Complexity of Data Reviewed Independent Historian:     Details: Daughter at bedside External Data Reviewed: notes.    Details:  Ongoing eval with primary care for hypertension Labs: ordered. Decision-making details documented in ED Course. Radiology: ordered and independent interpretation performed. Decision-making details documented in ED Course. ECG/medicine tests: independent interpretation performed. Decision-making details documented in ED Course.  Risk Prescription drug management. Decision regarding hospitalization. Diagnosis or treatment significantly limited by social determinants of health.   Update: On repeat exam the patient is in no distress is awake, alert, hemodynamically unremarkable, troponin x 2 normal, EKG nonischemic, no evidence for ACS, aortic disruption, or decompensated hypertensive crisis. Some suspicion for the patient's labile hypertension, anxiety contributing to today's presentation. After lengthy conversations  with the patient, and her daughter, the patient discharged to follow-up with cardiology tomorrow morning, with consideration of additional medication interventions as well as continuous cardiac monitoring.         Final Clinical Impression(s) / ED Diagnoses Final diagnoses:  Hypertensive urgency    Rx / DC Orders ED Discharge Orders     None         Gerhard Munch, MD 08/14/23 2317

## 2023-08-14 NOTE — Discharge Instructions (Signed)
As discussed, today's evaluation has been generally reassuring.  Please be sure to keep your appointment with the cardiology team tomorrow.  In particular discussed your concerns for hypertension or high blood pressure and consider with them utility of continuous cardiac monitoring for a period to ascertain whether your heart rhythm is consistent given your concern for or palpations.

## 2023-08-15 ENCOUNTER — Encounter: Payer: Self-pay | Admitting: Student

## 2023-08-15 ENCOUNTER — Ambulatory Visit: Payer: Medicare Other | Admitting: Student

## 2023-08-15 VITALS — BP 160/80 | HR 56 | Ht 62.0 in | Wt 123.6 lb

## 2023-08-15 DIAGNOSIS — E871 Hypo-osmolality and hyponatremia: Secondary | ICD-10-CM | POA: Insufficient documentation

## 2023-08-15 DIAGNOSIS — I10 Essential (primary) hypertension: Secondary | ICD-10-CM | POA: Insufficient documentation

## 2023-08-15 DIAGNOSIS — I739 Peripheral vascular disease, unspecified: Secondary | ICD-10-CM | POA: Insufficient documentation

## 2023-08-15 DIAGNOSIS — E782 Mixed hyperlipidemia: Secondary | ICD-10-CM

## 2023-08-15 DIAGNOSIS — I160 Hypertensive urgency: Secondary | ICD-10-CM | POA: Diagnosis not present

## 2023-08-15 DIAGNOSIS — I2510 Atherosclerotic heart disease of native coronary artery without angina pectoris: Secondary | ICD-10-CM | POA: Insufficient documentation

## 2023-08-15 MED ORDER — AMLODIPINE BESYLATE 5 MG PO TABS: 5.0000 mg | ORAL_TABLET | Freq: Two times a day (BID) | ORAL | 3 refills | Status: DC

## 2023-08-15 MED ORDER — CLONIDINE HCL 0.1 MG PO TABS: 0.1000 mg | ORAL_TABLET | ORAL | Status: DC | PRN

## 2023-08-15 NOTE — Progress Notes (Signed)
Cardiology Office Note    Date:  08/15/2023  ID:  Yetta, Oke 1936-02-07, MRN [id number] Cardiologist: Nona Dell, MD    History of Present Illness:    [name] is a 87 y.o. female with past medical history of CAD (s/p CABG in 02/2020), HTN, HLD, Type 2 DM, prior TIA, PAD (s/p stent graft of infrarenal abdominal aorta in 2009 and right renal artery intervention in 2013 and 2015 by Dr. Kirke Corin), atrial tachycardia and macular degeneration who presents to the office today for evaluation of variable BP.  She was last examined by Dr. Diona Browner in 05/2023 and denied any progressive anginal symptoms at that time but BP had been variable at home. SBP was initially in the 190's but rechecked and improved to 152/88. She was encouraged to keep track of BP at home as Amlodipine may need to be further titrated to 7.5 mg daily.  She called the office earlier this week reporting her SBP had been in the 200's but was at 147/75 on the most recent check. She was listed as taking Lopressor 50 mg twice daily, Imdur 15mg  daily, Amlodipine 5 mg daily and PRN Clonidine. Dr. Diona Browner did recommend that she start taking Clonidine more regularly at 0.1 mg twice daily. She reported the medication made her extremely tired, therefore it was recommended she try taking a half-tablet twice daily. Her BP was at 240/110 on 08/14/2023 and she reported feeling weak and short of breath, therefore she was advised to go to [facility] ED for further evaluation.  Labs were obtained and showed WBC 2.7, Hgb 14.3, platelets 202, Na+ 130, K+ 3.8 and creatinine 0.83. Troponin values were negative at 7 and EKG showed normal sinus rhythm, heart rate 63 with baseline artifact.  No changes were made to her medications and she was informed to keep scheduled follow-up for today.   In talking with the patient today, she reports her blood pressure has been significantly variable as discussed above. Says that she was unable to tolerate  taking Clonidine twice a day as this makes her extremely drowsy and fatigued. Also feels worse since Lopressor was titrated to 50mg  BID in the past. She does experience headaches and fatigue when BP is elevated. Also notices shortness of breath with this. No specific chest pain, orthopnea, PND or pitting edema.  Studies Reviewed:   EKG: EKG is not ordered today.   NST: 08/2020 There was no ST segment deviation noted during stress. Findings consistent with prior small inferior myocardial infarction. This is a low risk study. There is no current ischemia. The left ventricular ejection fraction is hyperdynamic (>65%).   Carotid Dopplers: 05/2023 Summary:  Right Carotid: Velocities in the right ICA are consistent with a 1-39%  stenosis.   Left Carotid: Velocities in the left ICA are consistent with a 1-39%  stenosis.   Vertebrals: Bilateral vertebral arteries demonstrate antegrade flow.   *See table(s) above for measurements and observations.   Physical Exam:   VS:  BP (!) 160/80   Pulse (!) 56   Ht 5\' 2"  (1.575 m)   Wt 123 lb 9.6 oz (56.1 kg)   SpO2 100%   BMI 22.61 kg/m    Wt Readings from Last 3 Encounters:  08/15/23 123 lb 9.6 oz (56.1 kg)  08/14/23 125 lb (56.7 kg)  05/15/23 125 lb 3.2 oz (56.8 kg)     GEN: Pleasant, elderly female appearing in no acute distress NECK: No JVD; No carotid bruits CARDIAC: RRR,  no murmurs, rubs, gallops RESPIRATORY:  Clear to auscultation without rales, wheezing or rhonchi  ABDOMEN: Appears non-distended. No obvious abdominal masses. EXTREMITIES: No clubbing or cyanosis. No pitting edema.  Distal pedal pulses are 2+ bilaterally.   Assessment and Plan:   1. Accelerated HTN - Her blood pressure has been elevated when checked at home and was at 160/80 during today's visit with similar results by recheck. She is currently on Amlodipine 5 mg daily, Clonidine 0.1 mg as needed, Imdur 15 mg daily and Lopressor 50 mg twice daily. Will try to  avoid Clonidine given her significant side effects with this. At this time, will plan to titrate Amlodipine to 10 mg daily and she prefers to take this as divided dosing of 5 mg twice daily. She reports fatigue with Lopressor and overall feels that she has not tolerated this well. Pending her BP trend, can perhaps discontinue this in the future or at least reduce to 25mg  BID. She reports having a cough with Lisinopril in the past but did not try an ARB. Pending her BP trend, would anticipate starting Losartan if BP remains above goal.  2. CAD - She is s/p CABG in 02/2020 and denies any specific anginal symptoms. Continue ASA 81 mg daily, Imdur 15 mg daily, Lopressor 50 mg twice daily and Crestor 20 mg daily.  3. HLD - Followed by her PCP.  She has remained on Crestor 20 mg daily with goal LDL less than 55.  4. PAD - She underwent stent grafting of the infrarenal abdominal aorta in 2009 and right renal artery intervention in 2013 and 2015 by Dr. Kirke Corin. If BP remains challenging to control, would obtain a repeat renal artery duplex as most recent imaging was in 05/2019.  5. Hyponatremia - Na+ was at 130 during recent ED evaluation and it appears she has experienced intermittent hyponatremia in the past as well. Would recheck a BMET following medication changes at her nurse visit if not checked by her PCP in the interim.   Disposition: Will arrange for a Nurse Visit in 2 weeks for a BP check and then follow-up in 2-3 months.    Signed, Ellsworth Lennox, PA-C

## 2023-08-15 NOTE — Patient Instructions (Signed)
Medication Instructions:   Increase Amlodipine to 5mg  twice daily.   *If you need a refill on your cardiac medications before your next appointment, please call your pharmacy*  Follow-Up: At [facility], you and your health needs are our priority.  As part of our continuing mission to provide you with exceptional heart care, we have created designated Provider Care Teams.  These Care Teams include your primary Cardiologist (physician) and Advanced Practice Providers (APPs -  Physician Assistants and Nurse Practitioners) who all work together to provide you with the care you need, when you need it.  We recommend signing up for the patient portal called "MyChart".  Sign up information is provided on this After Visit Summary.  MyChart is used to connect with patients for Virtual Visits (Telemedicine).  Patients are able to view lab/test results, encounter notes, upcoming appointments, etc.  Non-urgent messages can be sent to your provider as well.   To learn more about what you can do with MyChart, go to [email or web address].    Your next appointment:   Nurse Visit in [place] in 2 weeks. Bring home BP cuff and BP log.   Follow-up with Dr. Diona Browner in [place] in 2-3 months.

## 2023-08-16 ENCOUNTER — Telehealth: Payer: Self-pay | Admitting: Student

## 2023-08-16 ENCOUNTER — Other Ambulatory Visit: Payer: Self-pay

## 2023-08-16 NOTE — Telephone Encounter (Signed)
   We just increased Amlodipine to 10mg  daily at the time of her office visit yesterday, meaning today is the first day she has been on 10mg . It can take 2-3 weeks to see the impact on her BP. I would recommend that she continue to keep a blood pressure log and let us know her readings next week. We can add Losartan in the future as discussed at her office visit as well.  Signed, Ellsworth Lennox, PA-C 08/16/2023, 5:31 PM

## 2023-08-16 NOTE — Telephone Encounter (Signed)
Patient called the office and stated that she has been having elevated blood pressure readings. The past readings are: 204/94, 218/107, 156/72 and todays blood pressure at 5 pm was 170/81. Patient also reported that she had a headache and was flushed and was a little SOB when her blood pressure was elevated. Please advise on next steps for this patient.

## 2023-08-16 NOTE — Telephone Encounter (Signed)
Pt c/o BP issue: STAT if pt c/o blurred vision, one-sided weakness or slurred speech  1. What are your last 5 BP readings? 170/81  2. Are you having any other symptoms (ex. Dizziness, headache, blurred vision, passed out)? Headache. Blurred vision  3. What is your BP issue? Pt states she is having high BP. Please advise

## 2023-08-19 NOTE — Telephone Encounter (Signed)
Called patient and informed her of [place] Strader's recommendation below:  "We just increased Amlodipine to 10mg  daily at the time of her office visit yesterday, meaning today is the first day she has been on 10mg . It can take 2-3 weeks to see the impact on her BP. I would recommend that she continue to keep a blood pressure log and let us know her readings next week. We can add Losartan in the future as discussed at her office visit as well".  Patient verbalized understanding and stated that she would keep a blood pressure log and bring it to her next appointment. Patient had no further questions at this time.

## 2023-08-19 NOTE — Telephone Encounter (Signed)
Follow Up:     Patient called and wanted wanted to talk with [place]. She said she had helped hr last week,

## 2023-08-19 NOTE — Telephone Encounter (Signed)
Requesting an appointment to be seen by Dr. Diona Browner for BP issues. Advised that no appointments available for [facility] at this time but she could be added to the wait list. Reports BP this morning was 122/67 and last night around 8:00 pm, BP was 169/89. Medication reviewed and advised to continue monitoring BP's each day. Advised that she should give new adjustments to amlodipine at least 1-2 weeks to take affect. Advised to bring a copy of her home BP readings along with her home BP monitor to office 08/29/2023 when she comes for nurse visit. Advised she has been added to the wait list for [facility]. Verbalized understanding.

## 2023-08-20 ENCOUNTER — Other Ambulatory Visit (HOSPITAL_COMMUNITY): Payer: Self-pay

## 2023-08-23 ENCOUNTER — Telehealth: Payer: Self-pay | Admitting: Student

## 2023-08-23 DIAGNOSIS — Z79899 Other long term (current) drug therapy: Secondary | ICD-10-CM

## 2023-08-23 MED ORDER — LOSARTAN POTASSIUM 25 MG PO TABS: 25.0000 mg | ORAL_TABLET | Freq: Every day | ORAL | 3 refills | Status: DC

## 2023-08-23 NOTE — Telephone Encounter (Addendum)
I spoke with patient and she insists she can tolerate clonidine but she is agreeable to start Losartan 25 mg in the am and call back in 5 days with BP readings and update. She only wanted a 30 day supply. She would not agree to decrease amlodipine to 7.5 mg daily. She seem very anxious about taking her medications together. I told her she can take all of her am meds together,there is no issue.  She will have labs done in 2 weeks at [facility],I will mail the lab slip to her.

## 2023-08-23 NOTE — Telephone Encounter (Signed)
   At the time of her last office visit, she reported significant side effects with Clonidine and was trying to avoid this. If she is having headaches with Amlodipine, could try reducing to 7.5 mg daily (1.5 tablets) and can take this once in the morning or in the evening. Headaches are not a common side effect of Amlodipine and I am concerned that her headaches are due to her elevated blood pressure. Given her elevated readings and as discussed at our office visit, would recommend that we try Losartan 25 mg daily as she previously had a cough with Lisinopril in the past but that is not a common side effect with Losartan. If open to this, would need a repeat BMET in 2 weeks. Can be titrated over time if needed but would start low and see how BP responds.   Signed, Ellsworth Lennox, PA-C 08/23/2023, 12:55 PM Pager: [phone]

## 2023-08-23 NOTE — Telephone Encounter (Signed)
Patient states she cannot tolerate twice a day dosing of amlodipine as it causes headaches.She states her BP this am was 179/92 and has been in the 160's systolic most days.  She wants to stop the am dose of amlodipine and increase the clonidine.  She says tylenol does not help her headaches.

## 2023-08-23 NOTE — Telephone Encounter (Signed)
Pt c/o medication issue:  1. Name of Medication: amLODipine (NORVASC) 5 MG tablet   2. How are you currently taking this medication (dosage and times per day)?   Take 1 tablet (5 mg total) by mouth 2 (two) times daily.    3. Are you having a reaction (difficulty breathing--STAT)?   4. What is your medication issue? Patient states dose was just recently increased to twice a day and it's now giving her a headache.  She wants to know if she should stop taking it.

## 2023-08-23 NOTE — Addendum Note (Signed)
Addended by: Marlyn Corporal A on: 08/23/2023 01:16 PM   Modules accepted: Orders

## 2023-08-29 ENCOUNTER — Encounter: Payer: Self-pay | Admitting: Student

## 2023-08-29 ENCOUNTER — Ambulatory Visit: Payer: Medicare Other | Attending: Nurse Practitioner

## 2023-08-29 VITALS — BP 177/87 | HR 65 | Ht 62.0 in | Wt 125.4 lb

## 2023-08-29 DIAGNOSIS — Z79899 Other long term (current) drug therapy: Secondary | ICD-10-CM | POA: Diagnosis present

## 2023-08-29 DIAGNOSIS — I2510 Atherosclerotic heart disease of native coronary artery without angina pectoris: Secondary | ICD-10-CM | POA: Diagnosis present

## 2023-08-29 MED ORDER — LOSARTAN POTASSIUM 50 MG PO TABS: 50.0000 mg | ORAL_TABLET | Freq: Every day | ORAL | 3 refills | Status: DC

## 2023-08-29 NOTE — Patient Instructions (Addendum)
Medication Instructions:  Your physician has recommended you make the following change in your medication:  Please Increase Losartan to 50 Mg daily   Labwork: BMET in 1-2 weeks   Testing/Procedures: None  Follow-Up: Your physician recommends that you schedule a follow-up appointment in:   Any Other Special Instructions Will Be Listed Below (If Applicable).  If you need a refill on your cardiac medications before your next appointment, please call your pharmacy.

## 2023-08-29 NOTE — Progress Notes (Signed)
Patient here for BP check per [place]  Patient states not SOB, weakness or chest pain, she just says she is nervous  Patient states she was rushing this morning to get here. She feels whenever she is up doing something her face gets really flushed and can tell her BP is rising   BP readings turned in  Patient BP monitor 226/108  HR 87  Manual 220/90  HR 90    Let patient rest 10 Minutes and will recheck again   Patients Monitor -177/87  HR 67      Manual - 170/80  HR 65

## 2023-08-29 NOTE — Progress Notes (Signed)
   Discussed with Tori during patient's visit. Will increase Losartan to 50mg  daily. Repeat BMET in 2 weeks. Can bring BP log to the office in 2 weeks or have a nurse visit for BP check in 2 weeks. Will arrange for follow-up with an APP or Dr. Diona Browner in 4-6 weeks.   Signed, Ellsworth Lennox, PA-C 08/29/2023, 9:25 AM

## 2023-09-11 ENCOUNTER — Ambulatory Visit (HOSPITAL_COMMUNITY)
Admission: RE | Admit: 2023-09-11 | Discharge: 2023-09-11 | Disposition: A | Discharge status: Home / Self Care | Payer: Medicare Other | Source: Ambulatory Visit | Attending: Hematology | Admitting: Hematology

## 2023-09-11 ENCOUNTER — Inpatient Hospital Stay: Payer: Medicare Other | Attending: Hematology

## 2023-09-11 ENCOUNTER — Telehealth: Payer: Self-pay | Admitting: Cardiology

## 2023-09-11 DIAGNOSIS — Z85038 Personal history of other malignant neoplasm of large intestine: Secondary | ICD-10-CM | POA: Diagnosis not present

## 2023-09-11 DIAGNOSIS — C182 Malignant neoplasm of ascending colon: Secondary | ICD-10-CM | POA: Insufficient documentation

## 2023-09-11 DIAGNOSIS — K5730 Diverticulosis of large intestine without perforation or abscess without bleeding: Secondary | ICD-10-CM | POA: Diagnosis not present

## 2023-09-11 DIAGNOSIS — N8110 Cystocele, unspecified: Secondary | ICD-10-CM | POA: Diagnosis not present

## 2023-09-11 DIAGNOSIS — C189 Malignant neoplasm of colon, unspecified: Secondary | ICD-10-CM | POA: Diagnosis not present

## 2023-09-11 DIAGNOSIS — I10 Essential (primary) hypertension: Secondary | ICD-10-CM | POA: Diagnosis not present

## 2023-09-11 LAB — CBC WITH DIFFERENTIAL/PLATELET
Abs Immature Granulocytes: 0.01 10*3/uL (ref 0.00–0.07)
Basophils Absolute: 0 10*3/uL (ref 0.0–0.1)
Basophils Relative: 0 %
Eosinophils Absolute: 0.1 10*3/uL (ref 0.0–0.5)
Eosinophils Relative: 2 %
HCT: 39.8 % (ref 36.0–46.0)
Hemoglobin: 13 g/dL (ref 12.0–15.0)
Immature Granulocytes: 0 %
Lymphocytes Relative: 33 %
Lymphs Abs: 0.7 10*3/uL (ref 0.7–4.0)
MCH: 28.8 pg (ref 26.0–34.0)
MCHC: 32.7 g/dL (ref 30.0–36.0)
MCV: 88.2 fL (ref 80.0–100.0)
Monocytes Absolute: 0.2 10*3/uL (ref 0.1–1.0)
Monocytes Relative: 10 %
Neutro Abs: 1.2 10*3/uL — ABNORMAL LOW (ref 1.7–7.7)
Neutrophils Relative %: 55 %
Platelets: 190 10*3/uL (ref 150–400)
RBC: 4.51 MIL/uL (ref 3.87–5.11)
RDW: 14.6 % (ref 11.5–15.5)
WBC: 2.3 10*3/uL — ABNORMAL LOW (ref 4.0–10.5)
nRBC: 0 % (ref 0.0–0.2)

## 2023-09-11 LAB — COMPREHENSIVE METABOLIC PANEL
ALT: 23 U/L (ref 0–44)
AST: 24 U/L (ref 15–41)
Albumin: 4.7 g/dL (ref 3.5–5.0)
Alkaline Phosphatase: 83 U/L (ref 38–126)
Anion gap: 10 (ref 5–15)
BUN: 22 mg/dL (ref 8–23)
CO2: 27 mmol/L (ref 22–32)
Calcium: 10.5 mg/dL — ABNORMAL HIGH (ref 8.9–10.3)
Chloride: 97 mmol/L — ABNORMAL LOW (ref 98–111)
Creatinine, Ser: 0.73 mg/dL (ref 0.44–1.00)
GFR, Estimated: >60 mL/min (ref >60)
Glucose, Bld: 123 mg/dL — ABNORMAL HIGH (ref 70–99)
Potassium: 4 mmol/L (ref 3.5–5.1)
Sodium: 134 mmol/L — ABNORMAL LOW (ref 135–145)
Total Bilirubin: 0.9 mg/dL (ref 0.3–1.2)
Total Protein: 7.6 g/dL (ref 6.5–8.1)

## 2023-09-11 MED ORDER — IOHEXOL 300 MG/ML  SOLN
100.0000 mL | Freq: Once | INTRAMUSCULAR | Status: AC | PRN
  Administered 2023-09-11: 100 mL via INTRAVENOUS

## 2023-09-11 MED ORDER — IOHEXOL 9 MG/ML PO SOLN
500.0000 mL | ORAL | Status: AC
  Administered 2023-09-11: 500 mL via ORAL

## 2023-09-11 NOTE — Telephone Encounter (Signed)
NOT SURE WHO CANCELED TOMORROW'S APPOINTMENT ALREADY BUT GOT HER SCHEDULED FOR MONDAY

## 2023-09-11 NOTE — Telephone Encounter (Signed)
Patient needs to reschedule her nurse appt. Please advise

## 2023-09-11 NOTE — Telephone Encounter (Signed)
Pt states she needs to reschedule her appt with the nurse tomorrow morning. Please advise

## 2023-09-12 ENCOUNTER — Ambulatory Visit: Payer: Medicare Other

## 2023-09-12 LAB — CEA: CEA: 2.2 ng/mL (ref 0.0–4.7)

## 2023-09-16 ENCOUNTER — Encounter: Payer: Self-pay | Admitting: *Deleted

## 2023-09-16 ENCOUNTER — Ambulatory Visit: Payer: Medicare Other | Attending: Cardiology | Admitting: *Deleted

## 2023-09-16 VITALS — BP 204/98 | HR 78 | Ht 62.0 in | Wt 124.8 lb

## 2023-09-16 DIAGNOSIS — I10 Essential (primary) hypertension: Secondary | ICD-10-CM | POA: Insufficient documentation

## 2023-09-16 NOTE — Progress Notes (Unsigned)
Present for nurse visit to have BP check per Randall An PA after last nurse visit. Brought home blood pressure readings to visit. "Discussed with Tori during patient's visit. Will increase Losartan to 50mg  daily. Repeat BMET in 2 weeks. Can bring BP log to the office in 2 weeks or have a nurse visit for BP check in 2 weeks. Will arrange for follow-up with an APP or Dr. Diona Browner in 4-6 weeks.    Signed, Ellsworth Lennox, PA-C 08/29/2023, 9:25 AM"  Medications reviewed. Reports taking all doses of medications without side effects. Reports she did take her medications at 8:00 am this morning. Reports she did not check her home BP this morning. Denies dizziness, chest pain or sob. Vitals taken and sent to provider for review. Also sent to [facility] (PCP cardiologist and DOD)

## 2023-09-16 NOTE — Patient Instructions (Signed)
Medication Instructions:  Your physician recommends that you continue on your current medications as directed. Please refer to the Current Medication list given to you today.  Labwork: none  Testing/Procedures: none  Follow-Up: Your physician recommends that you schedule a follow-up appointment in: as planned  Any Other Special Instructions Will Be Listed Below (If Applicable).  If you need a refill on your cardiac medications before your next appointment, please call your pharmacy.

## 2023-09-17 NOTE — Progress Notes (Signed)
Home blood pressure readings and visit reviewed by Diona Browner on 09/16/2023. Per Diona Browner, "based on home blood pressure readings, no changes to medications at this time".  Home BP readings sent for scanning.

## 2023-09-18 DIAGNOSIS — H35722 Serous detachment of retinal pigment epithelium, left eye: Secondary | ICD-10-CM | POA: Diagnosis not present

## 2023-09-18 DIAGNOSIS — H02054 Trichiasis without entropian left upper eyelid: Secondary | ICD-10-CM | POA: Diagnosis not present

## 2023-09-18 DIAGNOSIS — H353132 Nonexudative age-related macular degeneration, bilateral, intermediate dry stage: Secondary | ICD-10-CM | POA: Diagnosis not present

## 2023-09-18 DIAGNOSIS — H353221 Exudative age-related macular degeneration, left eye, with active choroidal neovascularization: Secondary | ICD-10-CM | POA: Diagnosis not present

## 2023-09-19 ENCOUNTER — Inpatient Hospital Stay: Payer: Medicare Other

## 2023-09-19 ENCOUNTER — Inpatient Hospital Stay: Payer: Medicare Other | Attending: Hematology | Admitting: Hematology

## 2023-09-19 VITALS — BP 197/85 | HR 76 | Temp 98.6°F | Resp 16 | Wt 126.5 lb

## 2023-09-19 DIAGNOSIS — Z85038 Personal history of other malignant neoplasm of large intestine: Secondary | ICD-10-CM | POA: Diagnosis not present

## 2023-09-19 DIAGNOSIS — Z7982 Long term (current) use of aspirin: Secondary | ICD-10-CM | POA: Diagnosis not present

## 2023-09-19 DIAGNOSIS — E611 Iron deficiency: Secondary | ICD-10-CM

## 2023-09-19 DIAGNOSIS — Z08 Encounter for follow-up examination after completed treatment for malignant neoplasm: Secondary | ICD-10-CM | POA: Diagnosis not present

## 2023-09-19 DIAGNOSIS — D72819 Decreased white blood cell count, unspecified: Secondary | ICD-10-CM | POA: Diagnosis not present

## 2023-09-19 DIAGNOSIS — Z79899 Other long term (current) drug therapy: Secondary | ICD-10-CM | POA: Insufficient documentation

## 2023-09-19 DIAGNOSIS — I700 Atherosclerosis of aorta: Secondary | ICD-10-CM | POA: Insufficient documentation

## 2023-09-19 DIAGNOSIS — D709 Neutropenia, unspecified: Secondary | ICD-10-CM | POA: Diagnosis not present

## 2023-09-19 DIAGNOSIS — D649 Anemia, unspecified: Secondary | ICD-10-CM | POA: Insufficient documentation

## 2023-09-19 DIAGNOSIS — I82521 Chronic embolism and thrombosis of right iliac vein: Secondary | ICD-10-CM | POA: Diagnosis not present

## 2023-09-19 LAB — IRON AND TIBC
Iron: 98 ug/dL (ref 28–170)
Saturation Ratios: 22 % (ref 10.4–31.8)
TIBC: 453 ug/dL — ABNORMAL HIGH (ref 250–450)
UIBC: 355 ug/dL

## 2023-09-19 LAB — LACTATE DEHYDROGENASE: LDH: 157 U/L (ref 98–192)

## 2023-09-19 LAB — FERRITIN: Ferritin: 243 ng/mL (ref 11–307)

## 2023-09-19 LAB — FOLATE: Folate: 23.2 ng/mL (ref >5.9)

## 2023-09-19 LAB — VITAMIN B12: Vitamin B-12: 651 pg/mL (ref 180–914)

## 2023-09-19 NOTE — Patient Instructions (Addendum)
[facility] at [facility] Discharge Instructions   You were seen and examined today by Dr. Ellin Saba.  He reviewed the results of your CT scan which is normal. There is no evidence of cancer recurrence.  He reviewed the results of your lab work which are normal/stable. Your CEA (tumor marker) was normal.   We will do additional lab work today to investigate your low white blood cell count.  We will see you back in 5 weeks to go over these results.   Return as scheduled.    Thank you for choosing [facility] at [facility] to provide your oncology and hematology care.  To afford each patient quality time with our provider, please arrive at least 15 minutes before your scheduled appointment time.   If you have a lab appointment with the Cancer Center please come in thru the Main Entrance and check in at the main information desk.  You need to re-schedule your appointment should you arrive 10 or more minutes late.  We strive to give you quality time with our providers, and arriving late affects you and other patients whose appointments are after yours.  Also, if you no show three or more times for appointments you may be dismissed from the clinic at the providers discretion.     Again, thank you for choosing [facility].  Our hope is that these requests will decrease the amount of time that you wait before being seen by our physicians.       _____________________________________________________________  Should you have questions after your visit to [facility], please contact our office at [phone] and follow the prompts.  Our office hours are 8:00 a.m. and 4:30 p.m. Monday - Friday.  Please note that voicemails left after 4:00 p.m. may not be returned until the following business day.  We are closed weekends and major holidays.  You do have access to a nurse 24-7, just call the main number to the clinic [phone]  and do not press any options, hold on the line and a nurse will answer the phone.    For prescription refill requests, have your pharmacy contact our office and allow 72 hours.    Due to Covid, you will need to wear a mask upon entering the hospital. If you do not have a mask, a mask will be given to you at the Main Entrance upon arrival. For doctor visits, patients may have 1 support person age 35 or older with them. For treatment visits, patients can not have anyone with them due to social distancing guidelines and our immunocompromised population.

## 2023-09-19 NOTE — Progress Notes (Signed)
[facility] [address] [place] [address]    Clinic Day:  09/19/2023  Referring physician: Ignatius Specking, MD  Patient Care Team: Ignatius Specking, MD as PCP - General (Internal Medicine) Jonelle Sidle, MD as PCP - Cardiology (Cardiology) Melina Fiddler, MD as Consulting Physician (Sports Medicine) Delight Ovens, MD (Inactive) as Consulting Physician (Cardiothoracic Surgery) Therese Sarah, RN as Oncology Nurse Navigator (Oncology)   ASSESSMENT & PLAN:   Assessment: 1.  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 - CT AP with contrast on 08/10/2020 with abnormal appearance of right colon with colocolonic intussusception with wall thickening and abnormally enlarged adjacent right mesenteric lymph nodes. - CT chest with contrast on 09/07/2020 with no evidence of metastatic disease in the chest. -Colonoscopy on 09/15/2020-large spherical, ulcerated, partially necrotic tumor in the transverse/right colon.  Scope could not be maneuvered more proximally to the tumor. - Right hemicolectomy on 10/28/2020 by Dr. Cliffton Asters. - Pathology shows invasive moderately to poorly differentiated carcinoma involving the mid ascending colon, spanning 10 cm, tumor invades through muscularis propria into pericolorectal tissue, margins negative.  9/39 lymph nodes involved.  Lymphovascular invasion present.  PT3PN2B. - Loss of MLH1 and PMS2. - BRAF V600 E mutation positive.  MSI-high. -She has refused adjuvant chemotherapy.   2.  Social/family history: - She is accompanied by her daughter today.  Lives by herself and is independent of ADLs and IADLs.  Worked as a Scientist, physiological. - Maternal aunt had ovarian cancer.  No other malignancies.    Plan: 1.  Stage IIIc (PT3PN2B) adenocarcinoma of ascending colon: - She does not report any change in bowel habits or bleeding per rectum. - Labs from  09/11/2023: Normal LFTs.  CEA was normal at 2.2. - CTAP (09/11/2023): Stable exam with no evidence of recurrence or metastatic disease.  Other benign findings were discussed. - She will follow-up with [place] in 6 months with repeat labs and CEA.  2.  Mild hypercalcemia: - She has long history of mild hypercalcemia since 2015 and prior to that. - Vitamin D supplements were discontinued on 04/05/2023.  She is not on calcium supplements.  Calcium is stable at 10.5.  3.  Leukopenia and neutropenia: - She has leukopenia since last 1 and half year.  No recurrent infections. - Recent CTAP did not show splenomegaly. - Recommend further workup with B12, folic acid, MMA, copper, SPEP, SIFE X, S FLC, LDH, ANA, rheumatoid factor.  Will also check flow cytometry for leukemia/lymphoma panel. - RTC 4 to 5 weeks for follow-up to discuss results.    Orders Placed This Encounter  Procedures   Kappa/lambda light chains    Standing Status:   Future    Number of Occurrences:   1    Standing Expiration Date:   09/18/2024   Protein electrophoresis, serum    Standing Status:   Future    Number of Occurrences:   1    Standing Expiration Date:   09/18/2024   Iron and TIBC ([facility] ONLY)    Standing Status:   Future    Number of Occurrences:   1    Standing Expiration Date:   09/18/2024   Ferritin    Standing Status:   Future    Number of Occurrences:   1    Standing Expiration Date:   09/18/2024   Folate    Standing Status:   Future  Number of Occurrences:   1    Standing Expiration Date:   09/18/2024   Vitamin B12    Standing Status:   Future    Number of Occurrences:   1    Standing Expiration Date:   09/18/2024   Copper, serum    Standing Status:   Future    Number of Occurrences:   1    Standing Expiration Date:   09/18/2024   Lactate dehydrogenase    Standing Status:   Future    Number of Occurrences:   1    Standing Expiration Date:   09/18/2024   ANA, IFA (with reflex)     Standing Status:   Future    Number of Occurrences:   1    Standing Expiration Date:   09/18/2024   Rheumatoid factor    Standing Status:   Future    Number of Occurrences:   1    Standing Expiration Date:   09/18/2024   Flow Cytometry, Peripheral Blood (Oncology)    Leukemia/lymphoma panel    Standing Status:   Future    Number of Occurrences:   1    Standing Expiration Date:   09/18/2024      Mikeal Hawthorne R Teague,acting as a scribe for Doreatha Massed, MD.,have documented all relevant documentation on the behalf of Doreatha Massed, MD,as directed by  Doreatha Massed, MD while in the presence of Doreatha Massed, MD.  I, Doreatha Massed MD, have reviewed the above documentation for accuracy and completeness, and I agree with the above.    Doreatha Massed, MD   11/7/20244:57 PM  CHIEF COMPLAINT:   Diagnosis: right colon cancer    Cancer Staging  Cancer of ascending colon [facility] Staging form: Colon and Rectum, AJCC 8th Edition - Clinical stage from 01/15/2021: Stage IIIC (cT3, cN2b, cM0) - Signed by Doreatha Massed, MD on 01/15/2021    Prior Therapy: Right hemicolectomy on 10/28/2020  Current Therapy:  surveillance   HISTORY OF PRESENT ILLNESS:   Oncology History  Cancer of ascending colon (HCC)  01/15/2021 Initial Diagnosis   Cancer of ascending colon (HCC)   01/15/2021 Cancer Staging   Staging form: Colon and Rectum, AJCC 8th Edition - Clinical stage from 01/15/2021: Stage IIIC (cT3, cN2b, cM0) - Signed by Doreatha Massed, MD on 01/15/2021 Stage prefix: Initial diagnosis Microsatellite instability (MSI): Unstable high      INTERVAL HISTORY:   Tahlor is a 87 y.o. female presenting to clinic today for follow up of right colon cancer. She was last seen by me on 03/13/23.  Since her last visit, she underwent CT A/P on 09/11/23 that found: no evidence of recurrent or metastatic carcinoma; colonic diverticulosis, without radiographic evidence of  diverticulitis; and pelvic floor laxity with small cystocele. She presented to the ED on 08/14/23 for hypertensive urgency.   Today, she states that she is doing well overall. Her appetite level is at 100%. Her energy level is at 50%. She reports her blood pressure is normal at home. She recently started losartan, and is still taking metoprolol and amlodipine. She denies any changes in BM's, BRBPR, or hematuria. She denies having lupus or rheumatoid arthritis. She discontinued Vitamin D-3 supplements in May 2024.   PAST MEDICAL HISTORY:   Past Medical History: Past Medical History:  Diagnosis Date   Anemia    Anxiety    Arthritis    CAD (coronary artery disease)    Status post CABG April 2021 - Dr. Tyrone Sage   Chronic constipation  Claudication [facility])    Chronic occlusion of the right limb of aortic stent graft.   Colon cancer (HCC)    Diverticulosis    Left sided noted on colonoscopy 02/2006   Ectopic atrial tachycardia (HCC)    Esophageal reflux disease    Essential hypertension    Hiatal hernia    History of TIA (transient ischemic attack)    Hyperlipidemia    Lumbar radiculopathy    Macular degeneration    Migraines    Penetrating atherosclerotic ulcer of aorta (HCC)    Complex ulcerated plaque of the infrarenal abdominal aorta mild renal artery stenosis on the right side, normal left renal artery catheterization 2009, status post aortic stent graft   Renal artery stenosis (HCC)    a. Right renal artery stent April 2013 - Dr. Kirke Corin;  b. 03/2014 PTA/stenting of RRA 2/2 ISR: 6x18 Herculink stent.   Trigeminal neuralgia    Right   Type 2 diabetes mellitus (HCC)     Surgical History: Past Surgical History:  Procedure Laterality Date   ABDOMINAL AORTAGRAM N/A 02/13/2012   Procedure: ABDOMINAL Ronny Flurry;  Surgeon: Iran Ouch, MD;  Location: [facility] CATH LAB;  Service: Cardiovascular;  Laterality: N/A;   ABDOMINAL AORTAGRAM N/A 03/17/2014   Procedure: ABDOMINAL Ronny Flurry;   Surgeon: Iran Ouch, MD;  Location: [facility];  Service: Cardiovascular;  Laterality: N/A;   ABDOMINAL HYSTERECTOMY  1982   BIOPSY  09/15/2020   Procedure: BIOPSY;  Surgeon: Rachael Fee, MD;  Location: [facility] ENDOSCOPY;  Service: Endoscopy;;   BREAST BIOPSY Left 1990's   CARDIAC SURGERY     CAROTID ENDARTERECTOMY Left 1993   COLONOSCOPY     COLONOSCOPY WITH PROPOFOL N/A 09/15/2020   Procedure: COLONOSCOPY WITH PROPOFOL;  Surgeon: Rachael Fee, MD;  Location: [facility] ENDOSCOPY;  Service: Endoscopy;  Laterality: N/A;   CORONARY ARTERY BYPASS GRAFT N/A 03/02/2020   Procedure: CORONARY ARTERY BYPASS GRAFTING (CABG)x 3, LIMA TO LAD, SVG TO DIAGONAL, SVG TO RCA, WITH OPEN HARVESTING OF LEFT LOWER GREATER SAPHENOUS VEIN AND RIGHT AND LEFT GREATER SAPHENOUS VEIN HARVESTED ENDOSCOPICALLY;  Surgeon: Delight Ovens, MD;  Location: [facility] OR;  Service: Open Heart Surgery;  Laterality: N/A;   Endologix powerlink bifurcated     Covered stent graft   ENDOVEIN HARVEST OF GREATER SAPHENOUS VEIN Bilateral 03/02/2020   Procedure: Mack Guise Of Greater Saphenous Vein;  Surgeon: Delight Ovens, MD;  Location: [facility] OR;  Service: Open Heart Surgery;  Laterality: Bilateral;   LAPAROSCOPIC RIGHT HEMI COLECTOMY Right 10/28/2020   Procedure: LAPAROSCOPIC RIGHT HEMI COLECTOMY,;  Surgeon: Andria Meuse, MD;  Location: [facility] ORS;  Service: General;  Laterality: Right;   LEFT HEART CATH AND CORONARY ANGIOGRAPHY N/A 01/28/2020   Procedure: LEFT HEART CATH AND CORONARY ANGIOGRAPHY;  Surgeon: Yvonne Kendall, MD;  [facility]: [facility] INVASIVE CV LAB;  Service: Cardiovascular;  Laterality: N/A;   LEFT HEART CATHETERIZATION WITH CORONARY ANGIOGRAM N/A 03/17/2014   Procedure: LEFT HEART CATHETERIZATION WITH CORONARY ANGIOGRAM;  Surgeon: Iran Ouch, MD;  [facility]: [facility];  Service: Cardiovascular;  Laterality: N/A;   LUMBAR DISC SURGERY  1981   PERCUTANEOUS STENT INTERVENTION Right 03/17/2014   Procedure:  PERCUTANEOUS STENT INTERVENTION;  Surgeon: Iran Ouch, MD;  [facility]: [facility];  Service: Cardiovascular;  Laterality: Right;  Renal   RENAL ANGIOGRAM N/A 02/13/2012   Procedure: RENAL ANGIOGRAM;  Surgeon: Iran Ouch, MD;  Location: [facility];  Service: Cardiovascular;  Laterality: N/A;  RENAL ANGIOGRAM N/A 03/17/2014   Procedure: RENAL ANGIOGRAM;  Surgeon: Iran Ouch, MD;  [facility]: [facility];  Service: Cardiovascular;  Laterality: N/A;   SCLEROTHERAPY  09/15/2020   Procedure: SCLEROTHERAPY;  Surgeon: Rachael Fee, MD;  Location: [facility];  Service: Endoscopy;;   TEE WITHOUT CARDIOVERSION N/A 03/02/2020   Procedure: TRANSESOPHAGEAL ECHOCARDIOGRAM (TEE);  Surgeon: Delight Ovens, MD;  Location: [facility] OR;  Service: Open Heart Surgery;  Laterality: N/A;   TONSILLECTOMY AND ADENOIDECTOMY  1947    Social History: Social History   Socioeconomic History   Marital status: Widowed    Spouse name: Not on file   Number of children: 1   Years of education: 78   Highest education level: Not on file  Occupational History   Occupation: Retired    Comment: Solicitor at [employer]  Tobacco Use   Smoking status: Former    Current packs/day: 0.00    Average packs/day: 0.2 packs/day for 1 year (0.2 ttl pk-yrs)    Types: Cigarettes    Start date: 11/12/1978    Quit date: 11/13/1979    Years since quitting: 43.8   Smokeless tobacco: Never   Tobacco comments:    "smoked a few months"  Vaping Use   Vaping status: Never Used  Substance and Sexual Activity   Alcohol use: No    Alcohol/week: 0.0 standard drinks of alcohol   Drug use: Never   Sexual activity: Not on file    Comment: Hysterectomy  Other Topics Concern   Not on file  Social History Narrative   Drinks 1/2 caf coffee 3-4 cups a day    Social Determinants of Health   Financial Resource Strain: Low Risk  (11/22/2020)   Overall Financial Resource Strain (CARDIA)    Difficulty of Paying Living Expenses: Not  hard at all  Food Insecurity: No Food Insecurity (11/22/2020)   Hunger Vital Sign    Worried About Running Out of Food in the Last Year: Never true    Ran Out of Food in the Last Year: Never true  Transportation Needs: No Transportation Needs (11/22/2020)   PRAPARE - Administrator, Civil Service (Medical): No    Lack of Transportation (Non-Medical): No  Physical Activity: Inactive (11/22/2020)   Exercise Vital Sign    Days of Exercise per Week: 0 days    Minutes of Exercise per Session: 0 min  Stress: Stress Concern Present (11/22/2020)   [employer] of Occupational Health - Occupational Stress Questionnaire    Feeling of Stress : To some extent  Social Connections: Moderately Isolated (11/22/2020)   Social Connection and Isolation Panel [NHANES]    Frequency of Communication with Friends and Family: Three times a week    Frequency of Social Gatherings with Friends and Family: Three times a week    Attends Religious Services: More than 4 times per year    Active Member of Clubs or Organizations: No    Attends Banker Meetings: Never    Marital Status: Widowed  Intimate Partner Violence: Not At Risk (11/22/2020)   Humiliation, Afraid, Rape, and Kick questionnaire    Fear of Current or Ex-Partner: No    Emotionally Abused: No    Physically Abused: No    Sexually Abused: No    Family History: Family History  Problem Relation Age of Onset   Heart failure Mother    Coronary artery disease Mother    Heart disease Mother    Hypertension Mother  Stroke Mother    Heart attack Father    Heart disease Father    Hypertension Father    Hypertension Sister    Parkinson's disease Sister    Migraines Sister    Kidney disease Sister    Stroke Sister    Stroke Sister    Hypertension Sister    Heart disease Brother    Hypertension Brother    Breast cancer Neg Hx     Current Medications:  Current Outpatient Medications:    acetaminophen (TYLENOL) 325  MG tablet, Take 325 mg by mouth every 4 (four) hours as needed for moderate pain., Disp: , Rfl:    amLODipine (NORVASC) 5 MG tablet, Take 7.5 mg by mouth at bedtime., Disp: , Rfl:    aspirin EC 81 MG tablet, Take 81 mg by mouth daily. Swallow whole., Disp: , Rfl:    clonazePAM (KLONOPIN) 0.5 MG tablet, Take 0.25 mg by mouth at bedtime., Disp: , Rfl:    cloNIDine (CATAPRES) 0.1 MG tablet, Take 0.1 mg by mouth daily as needed (elevated blood pressure)., Disp: , Rfl:    docusate sodium (COLACE) 100 MG capsule, Take 100 mg by mouth in the morning and at bedtime., Disp: , Rfl:    isosorbide mononitrate (IMDUR) 30 MG 24 hr tablet, TAKE 0.5 TABLETS BY MOUTH EVERY DAY, Disp: 45 tablet, Rfl: 3   losartan (COZAAR) 50 MG tablet, Take 1 tablet (50 mg total) by mouth daily., Disp: 30 tablet, Rfl: 3   metoprolol tartrate (LOPRESSOR) 50 MG tablet, Take 1 tablet (50 mg total) by mouth 2 (two) times daily., Disp: 180 tablet, Rfl: 3   Multiple Vitamins-Minerals (PRESERVISION AREDS 2 PO), Take 1 tablet by mouth in the morning and at bedtime., Disp: , Rfl:    Omega-3 Fatty Acids (FISH OIL PO), Take 1,400 mg by mouth daily., Disp: , Rfl:    rosuvastatin (CRESTOR) 20 MG tablet, Take 1 tablet (20 mg total) by mouth at bedtime., Disp: 90 tablet, Rfl: 3   nitroGLYCERIN (NITROSTAT) 0.4 MG SL tablet, Place 0.4 mg under the tongue every 5 (five) minutes x 3 doses as needed for chest pain (if no relief after 3rd dose, proceed to ED or call 911). (Patient not taking: Reported on 09/19/2023), Disp: , Rfl:    Allergies: Allergies  Allergen Reactions   Cefixime Shortness Of Breath    Patient is not familiar with this listed allergy   Conjugated Estrogens Other (See Comments)    REACTION: flushing   Morphine And Codeine Other (See Comments)    Morphine injection Neck pain   Codeine Other (See Comments)    REACTION: dizziness   Iohexol      Desc: pt has anxiety and an irregular heartbeat. contrast exacerbates this. pt was  very uncomfortable after a 20cc bolus for an miroi. we did w/o iv 11/09 per dr. Eppie Gibson. pt will talk w/ md about this and will probably not get iv contrast in the future at her re, Onset Date: [id number] Multi CT's with contrast done at [facility] without pre meds, Heart Cath without pre med documented 2021. CT A/P with contrast without premeds 11/2020 without issues     Lisinopril     Cough   Adenosine Other (See Comments)    Elevated heart rate when doing stress test    REVIEW OF SYSTEMS:   Review of Systems  Constitutional:  Negative for chills, fatigue and fever.  HENT:   Negative for lump/mass, mouth sores, nosebleeds, sore throat and trouble  swallowing.   Eyes:  Negative for eye problems.  Respiratory:  Positive for shortness of breath. Negative for cough.   Cardiovascular:  Positive for palpitations. Negative for chest pain and leg swelling.  Gastrointestinal:  Negative for abdominal pain, constipation, diarrhea, nausea and vomiting.  Genitourinary:  Negative for bladder incontinence, difficulty urinating, dysuria, frequency, hematuria and nocturia.   Musculoskeletal:  Negative for arthralgias, back pain, flank pain, myalgias and neck pain.  Skin:  Negative for itching and rash.  Neurological:  Positive for headaches and numbness. Negative for dizziness. Seizures: in hands. Hematological:  Does not bruise/bleed easily.  Psychiatric/Behavioral:  Negative for depression, sleep disturbance and suicidal ideas. The patient is not nervous/anxious.   All other systems reviewed and are negative.    VITALS:   Blood pressure (!) 197/85, pulse 76, temperature 98.6 F (37 C), temperature source Oral, resp. rate 16, weight 126 lb 8 oz (57.4 kg), SpO2 97%.  Wt Readings from Last 3 Encounters:  09/19/23 126 lb 8 oz (57.4 kg)  09/16/23 124 lb 12.8 oz (56.6 kg)  08/29/23 125 lb 6.4 oz (56.9 kg)    Body mass index is 23.14 kg/m.  Performance status (ECOG): 1 - Symptomatic but completely  ambulatory  PHYSICAL EXAM:   Physical Exam Vitals and nursing note reviewed. Exam conducted with a chaperone present.  Constitutional:      Appearance: Normal appearance.  Cardiovascular:     Rate and Rhythm: Normal rate and regular rhythm.     Pulses: Normal pulses.     Heart sounds: Normal heart sounds.  Pulmonary:     Effort: Pulmonary effort is normal.     Breath sounds: Normal breath sounds.  Abdominal:     Palpations: Abdomen is soft. There is no hepatomegaly, splenomegaly or mass.     Tenderness: There is no abdominal tenderness.  Musculoskeletal:     Right lower leg: No edema.     Left lower leg: No edema.  Lymphadenopathy:     Cervical: No cervical adenopathy.     Right cervical: No superficial, deep or posterior cervical adenopathy.    Left cervical: No superficial, deep or posterior cervical adenopathy.     Upper Body:     Right upper body: No supraclavicular or axillary adenopathy.     Left upper body: No supraclavicular or axillary adenopathy.  Neurological:     General: No focal deficit present.     Mental Status: She is alert and oriented to person, place, and time.  Psychiatric:        Mood and Affect: Mood normal.        Behavior: Behavior normal.     LABS:      Latest Ref Rng & Units 09/11/2023   10:38 AM 08/14/2023    4:43 PM 03/06/2023    1:44 PM  CBC  WBC 4.0 - 10.5 K/uL 2.3  2.7  3.5   Hemoglobin 12.0 - 15.0 g/dL 41.6  60.6  30.1   Hematocrit 36.0 - 46.0 % 39.8  42.0  44.0   Platelets 150 - 400 K/uL 190  202  205       Latest Ref Rng & Units 09/11/2023   10:38 AM 08/14/2023    4:43 PM 03/06/2023    2:36 PM  CMP  Glucose 70 - 99 mg/dL 601  093    BUN 8 - 23 mg/dL 22  21    Creatinine 2.35 - 1.00 mg/dL 5.73  2.20  2.54   Sodium 135 -  145 mmol/L 134  130    Potassium 3.5 - 5.1 mmol/L 4.0  3.8    Chloride 98 - 111 mmol/L 97  93    CO2 22 - 32 mmol/L 27  27    Calcium 8.9 - 10.3 mg/dL 16.1  09.6    Total Protein 6.5 - 8.1 g/dL 7.6      Total Bilirubin 0.3 - 1.2 mg/dL 0.9     Alkaline Phos 38 - 126 U/L 83     AST 15 - 41 U/L 24     ALT 0 - 44 U/L 23        Lab Results  Component Value Date   CEA1 2.2 09/11/2023   /  CEA  Date Value Ref Range Status  09/11/2023 2.2 0.0 - 4.7 ng/mL Final    Comment:    (NOTE)                             Nonsmokers          <3.9                             Smokers             <5.6 Roche [employer]ostics Electrochemiluminescence Immunoassay (ECLIA) Values obtained with different assay methods or kits cannot be used interchangeably.  Results cannot be interpreted as absolute evidence of the presence or absence of malignant disease. Performed At: [facility] [address] [place], [place] [id number] Jolene Schimke MD BJ:[id number]    No results found for: "PSA1" No results found for: "CAN199" No results found for: "CAN125"  No results found for: "TOTALPROTELP", "ALBUMINELP", "A1GS", "A2GS", "BETS", "BETA2SER", "GAMS", "MSPIKE", "SPEI" Lab Results  Component Value Date   TIBC 453 (H) 09/19/2023   TIBC 414 06/07/2022   TIBC 424 11/16/2021   FERRITIN 243 09/19/2023   FERRITIN 128 06/07/2022   FERRITIN 130 11/16/2021   IRONPCTSAT 22 09/19/2023   IRONPCTSAT 24 06/07/2022   IRONPCTSAT 16 11/16/2021   Lab Results  Component Value Date   LDH 157 09/19/2023     STUDIES:   CT Abdomen Pelvis W Contrast  Result Date: 09/14/2023 CLINICAL DATA:  Follow-up colon carcinoma. Surveillance. Previous right colectomy. * Tracking Code: BO * EXAM: CT ABDOMEN AND PELVIS WITH CONTRAST TECHNIQUE: Multidetector CT imaging of the abdomen and pelvis was performed using the standard protocol following bolus administration of intravenous contrast. RADIATION DOSE REDUCTION: This exam was performed according to the departmental dose-optimization program which includes automated exposure control, adjustment of the mA and/or kV according to patient size and/or use of iterative reconstruction  technique. CONTRAST:  OMNIPAQUE IOHEXOL 300 MG/ML  SOLN COMPARISON:  03/06/2023 FINDINGS: Lower Chest: No acute findings. Hepatobiliary: No suspicious hepatic masses identified. Gallbladder is unremarkable. No evidence of biliary ductal dilatation. Pancreas:  No mass or inflammatory changes. Spleen: Within normal limits in size and appearance. Adrenals/Urinary Tract: No suspicious masses identified. No evidence of ureteral calculi or hydronephrosis. Pelvic floor laxity with small cystocele noted. Stomach/Bowel: Postop changes from right colectomy remains stable. No mass identified. No evidence of obstruction, inflammatory process or abnormal fluid collections. Diverticulosis is seen mainly involving the sigmoid colon, however there is no evidence of diverticulitis. Vascular/Lymphatic: No pathologically enlarged lymph nodes. No acute vascular findings. Aorto biiliac stent graft again seen, with chronic occlusion of the right common iliac artery. Reproductive: Prior hysterectomy noted.  Adnexal regions are unremarkable in appearance. Other:  None. Musculoskeletal:  No suspicious bone lesions identified. IMPRESSION: Stable exam. No evidence of recurrent or metastatic carcinoma. Colonic diverticulosis, without radiographic evidence of diverticulitis. Pelvic floor laxity with small cystocele. Aortic Atherosclerosis (ICD10-I70.0). Electronically Signed   By: Danae Orleans M.D.   On: 09/14/2023 16:50

## 2023-09-20 LAB — KAPPA/LAMBDA LIGHT CHAINS
Kappa free light chain: 18.9 mg/L (ref 3.3–19.4)
Kappa, lambda light chain ratio: 1.95 — ABNORMAL HIGH (ref 0.26–1.65)
Lambda free light chains: 9.7 mg/L (ref 5.7–26.3)

## 2023-09-20 LAB — SURGICAL PATHOLOGY

## 2023-09-20 LAB — RHEUMATOID FACTOR: Rheumatoid fact SerPl-aCnc: <10 [IU]/mL (ref <14.0)

## 2023-09-23 LAB — ANTINUCLEAR ANTIBODIES, IFA: ANA Ab, IFA: NEGATIVE

## 2023-09-23 LAB — COPPER, SERUM: Copper: 131 ug/dL (ref 80–158)

## 2023-09-25 ENCOUNTER — Encounter: Payer: Self-pay | Admitting: Student

## 2023-09-25 ENCOUNTER — Ambulatory Visit: Payer: Medicare Other | Attending: Student | Admitting: Student

## 2023-09-25 VITALS — BP 160/82 | HR 74 | Ht 62.0 in | Wt 126.0 lb

## 2023-09-25 DIAGNOSIS — E782 Mixed hyperlipidemia: Secondary | ICD-10-CM | POA: Insufficient documentation

## 2023-09-25 DIAGNOSIS — I10 Essential (primary) hypertension: Secondary | ICD-10-CM | POA: Diagnosis not present

## 2023-09-25 DIAGNOSIS — I2510 Atherosclerotic heart disease of native coronary artery without angina pectoris: Secondary | ICD-10-CM | POA: Insufficient documentation

## 2023-09-25 DIAGNOSIS — E871 Hypo-osmolality and hyponatremia: Secondary | ICD-10-CM | POA: Diagnosis not present

## 2023-09-25 DIAGNOSIS — I739 Peripheral vascular disease, unspecified: Secondary | ICD-10-CM | POA: Insufficient documentation

## 2023-09-25 LAB — FLOW CYTOMETRY

## 2023-09-25 NOTE — Patient Instructions (Signed)
Medication Instructions:   Your physician recommends that you continue on your current medications as directed. Please refer to the Current Medication list given to you today.   Labwork: None today  Testing/Procedures: None today  Follow-Up: Keep January appointment with Dr.McDowell  Any Other Special Instructions Will Be Listed Below (If Applicable).  Keep daily BP log and bring with you at your January appointment  If you need a refill on your cardiac medications before your next appointment, please call your pharmacy.

## 2023-09-25 NOTE — Progress Notes (Signed)
Cardiology Office Note    Date:  09/25/2023  ID:  [name], DOB 1936/10/05, MRN [id number] Cardiologist: Nona Dell, MD    History of Present Illness:    [name] is a 87 y.o. female with past medical history of CAD (s/p CABG in 02/2020), HTN, HLD, Type 2 DM, prior TIA, PAD (s/p stent graft of infrarenal abdominal aorta in 2009 and right renal artery intervention in 2013 and 2015 by Dr. Kirke Corin), atrial tachycardia, history of adenocarcinoma of ascending colon and macular degeneration who presents to the office today for 6-week follow-up.  She was examined by myself in 08/2023 and reported that her blood pressure had been significantly variable at home and she had previously been prescribed Clonidine to take 0.1 mg twice daily but experienced worsening fatigue and drowsiness with this. She was taking Amlodipine 5 mg daily, Imdur 15 mg daily and Lopressor 50 mg daily. Was recommend to titrate Amlodipine to 10 mg daily and consider the addition of Losartan pending her BP trend. She previously had a cough with Lisinopril.  She was ultimately started on Losartan and this was titrated from 25 mg daily to 50 mg daily at the time of her nurse visit on 08/29/2023. She had a follow-up nurse visit on 09/16/2023 and BP was elevated 204/98. Home readings were reviewed and overall well-controlled. This was reviewed with Dr. Diona Browner who recommended no changes in her medications at that time.  In talking with the patient today, she reports her blood pressure has overall been well-controlled at home and she brings with her a blood pressure log and SBP has been in the 120's to 140's with occasional SBP in the 160's. She does report having headaches on a daily basis which is not dependent on her BP readings. Was previously evaluated by Neurology for migraine headaches but declined medical therapy at that time. She feels like her headaches did worsen with titration of Lopressor in the past and we  reviewed possible reduction of this to see if she experiences improvement in her symptoms but she wishes to hold off on this for now. She denies any specific chest pain or palpitations. No recent orthopnea, PND or pitting edema.  Studies Reviewed:   EKG: EKG is not ordered today.  Renal Artery Duplex: 05/2019 Summary:  Largest Aortic Diameter: 2.2 cm    Renal:    Right: Normal size right kidney. Cyst(s) noted. RRV flow present. No         evidence of right renal artery stenosis. Normal cortical         thickness of right kidney. Patent right renal artery stent.  Left:  Normal size of left kidney. Normal cortical thickness of the         left kidney. No evidence of left renal artery stenosis.  Mesenteric:  Normal Celiac artery and Superior Mesenteric artery findings.  Patent IVC.    *See table(s) above for measurements and observations.   NST: 08/2020 There was no ST segment deviation noted during stress. Findings consistent with prior small inferior myocardial infarction. This is a low risk study. There is no current ischemia. The left ventricular ejection fraction is hyperdynamic (>65%).  Carotid Duplex: 05/2023 Summary:  Right Carotid: Velocities in the right ICA are consistent with a 1-39%  stenosis.   Left Carotid: Velocities in the left ICA are consistent with a 1-39%  stenosis.   Vertebrals: Bilateral vertebral arteries demonstrate antegrade flow.   Risk Assessment/Calculations:     HYPERTENSION  CONTROL Vitals:   09/25/23 1422 09/25/23 1454  BP: (!) 200/84 (!) 160/82    The patient's blood pressure is elevated above target today.  In order to address the patient's elevated BP: Blood pressure will be monitored at home to determine if medication changes need to be made.            Physical Exam:   VS:  BP (!) 160/82   Pulse 74   Ht 5\' 2"  (1.575 m)   Wt 126 lb (57.2 kg)   SpO2 94%   BMI 23.05 kg/m    Wt Readings from Last 3 Encounters:  09/25/23  126 lb (57.2 kg)  09/19/23 126 lb 8 oz (57.4 kg)  09/16/23 124 lb 12.8 oz (56.6 kg)     GEN: Well nourished, well developed female appearing in no acute distress NECK: No JVD; No carotid bruits CARDIAC: RRR, no murmurs, rubs, gallops RESPIRATORY:  Clear to auscultation without rales, wheezing or rhonchi  ABDOMEN: Appears non-distended. No obvious abdominal masses. EXTREMITIES: No clubbing or cyanosis. No pitting edema.  Distal pedal pulses are 2+ bilaterally.   Assessment and Plan:   1. HTN - Blood pressure has been significantly variable and she does have whitecoat hypertension when BP initially recorded at 200/84 during today's visit, rechecked and improved to 160/82. Her home blood pressure cuff has been checked against ours for accuracy and this does match up. BP has overall been well-controlled when checked at home as discussed above.  - For now, will continue current medical therapy with Amlodipine 7.5 mg daily (previously intolerant to higher dosing), Imdur 15 mg daily, Losartan 50 mg daily and Lopressor 50 mg twice daily. We discussed obtaining follow-up renal arterial dopplers if she has resistant hypertension but she wishes to hold off on this for now given readings have overall improved.  2. CAD - She underwent CABG in 02/2020. She denies any recent anginal symptoms. Continue ASA 81 mg daily, Imdur 15 mg daily, Lopressor 50 mg twice daily and Crestor 20 mg daily.  3. PAD - Previously underwent stent grafting of the infrarenal abdominal aorta in 2009 and right renal artery intervention in 2003 and 2015. No significant stenosis by most recent imaging in 05/2019.  - Discussed repeat imaging as outlined above but she wishes to hold off for now.  4. HLD - Followed by her PCP and she remains on Crestor 20 mg daily.  5. Hyponatremia - She has chronic hyponatremia but this had improved to 134 when checked on 09/11/2023.  Signed, Ellsworth Lennox, PA-C

## 2023-09-26 LAB — PROTEIN ELECTROPHORESIS, SERUM
A/G Ratio: 1.7 (ref 0.7–1.7)
Albumin ELP: 4.8 g/dL — ABNORMAL HIGH (ref 2.9–4.4)
Alpha-1-Globulin: 0.3 g/dL (ref 0.0–0.4)
Alpha-2-Globulin: 0.7 g/dL (ref 0.4–1.0)
Beta Globulin: 1.1 g/dL (ref 0.7–1.3)
Gamma Globulin: 0.8 g/dL (ref 0.4–1.8)
Globulin, Total: 2.9 g/dL (ref 2.2–3.9)
Total Protein ELP: 7.7 g/dL (ref 6.0–8.5)

## 2023-09-30 ENCOUNTER — Other Ambulatory Visit: Payer: Self-pay | Admitting: *Deleted

## 2023-09-30 DIAGNOSIS — Z85038 Personal history of other malignant neoplasm of large intestine: Secondary | ICD-10-CM | POA: Diagnosis not present

## 2023-09-30 DIAGNOSIS — D649 Anemia, unspecified: Secondary | ICD-10-CM | POA: Diagnosis not present

## 2023-09-30 DIAGNOSIS — Z08 Encounter for follow-up examination after completed treatment for malignant neoplasm: Secondary | ICD-10-CM | POA: Diagnosis not present

## 2023-09-30 DIAGNOSIS — D72819 Decreased white blood cell count, unspecified: Secondary | ICD-10-CM

## 2023-09-30 DIAGNOSIS — I700 Atherosclerosis of aorta: Secondary | ICD-10-CM | POA: Diagnosis not present

## 2023-09-30 DIAGNOSIS — D709 Neutropenia, unspecified: Secondary | ICD-10-CM | POA: Diagnosis not present

## 2023-09-30 DIAGNOSIS — E611 Iron deficiency: Secondary | ICD-10-CM

## 2023-10-02 LAB — IMMUNOFIXATION, URINE

## 2023-10-05 DIAGNOSIS — Z23 Encounter for immunization: Secondary | ICD-10-CM | POA: Diagnosis not present

## 2023-10-11 DIAGNOSIS — I10 Essential (primary) hypertension: Secondary | ICD-10-CM | POA: Diagnosis not present

## 2023-10-27 NOTE — Progress Notes (Signed)
[facility] [address] [place] [address]    Clinic Day:  10/28/2023  Referring physician: Ignatius Specking, MD  Patient Care Team: Ignatius Specking, MD as PCP - General (Internal Medicine) Jonelle Sidle, MD as PCP - Cardiology (Cardiology) Melina Fiddler, MD as Consulting Physician (Sports Medicine) Delight Ovens, MD (Inactive) as Consulting Physician (Cardiothoracic Surgery) Therese Sarah, RN as Oncology Nurse Navigator (Oncology)   ASSESSMENT & PLAN:   Assessment: 1.  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 - CT AP with contrast on 08/10/2020 with abnormal appearance of right colon with colocolonic intussusception with wall thickening and abnormally enlarged adjacent right mesenteric lymph nodes. - CT chest with contrast on 09/07/2020 with no evidence of metastatic disease in the chest. -Colonoscopy on 09/15/2020-large spherical, ulcerated, partially necrotic tumor in the transverse/right colon.  Scope could not be maneuvered more proximally to the tumor. - Right hemicolectomy on 10/28/2020 by Dr. Cliffton Asters. - Pathology shows invasive moderately to poorly differentiated carcinoma involving the mid ascending colon, spanning 10 cm, tumor invades through muscularis propria into pericolorectal tissue, margins negative.  9/39 lymph nodes involved.  Lymphovascular invasion present.  PT3PN2B. - Loss of MLH1 and PMS2. - BRAF V600 E mutation positive.  MSI-high. -She has refused adjuvant chemotherapy.   2.  Social/family history: - She is accompanied by her daughter today.  Lives by herself and is independent of ADLs and IADLs.  Worked as a Scientist, physiological. - Maternal aunt had ovarian cancer.  No other malignancies.    Plan: 1.  Stage IIIc (PT3PN2B) adenocarcinoma of ascending colon: - She does not report any change in bowel habits or bleeding per rectum. - Labs from  09/11/2023: Normal LFTs.  CEA was normal at 2.2. - CTAP (09/11/2023): Stable exam with no evidence of recurrence or metastatic disease.  Other benign findings were discussed. - She will follow-up in 6 months with repeat labs and CEA.  Will repeat imaging in 1 year.   2.  Mild hypercalcemia: - She has long history of mild hypercalcemia since 2015 and prior to that. - Vitamin D supplements were discontinued on 04/05/2023.  She is not on calcium supplements.  Calcium is stable at 10.5.   3.  Leukopenia and neutropenia: - She has leukopenia since the beginning of 2023.  No recurrent infections. - Recent CTAP: No splenomegaly. - We reviewed workup from 09/19/2023: B12, folic acid and copper levels were normal.  SPEP is negative.  ANA and rheumatoid factor were negative.  FLC ratio is elevated at 1.95. - Flow cytometry did not show any abnormal B or T-cell population. - Differential diagnosis includes early MDS. - Will defer bone marrow biopsy for now.  Will follow-up in 5 months with repeat CBC with differential.  Will also check serum immunofixation and repeat serum free light chains at next visit.    Orders Placed This Encounter  Procedures   CBC with Differential    Standing Status:   Future    Expected Date:   03/18/2024    Expiration Date:   10/27/2024   Comprehensive metabolic panel    Standing Status:   Future    Expected Date:   03/18/2024    Expiration Date:   10/27/2024   CEA    Standing Status:   Future    Expected Date:   03/18/2024    Expiration Date:   10/27/2024   Methylmalonic  acid, serum    Standing Status:   Future    Expected Date:   03/18/2024    Expiration Date:   10/27/2024   Immunofixation electrophoresis    Standing Status:   Future    Expected Date:   03/18/2024    Expiration Date:   10/27/2024   Kappa/lambda light chains    Standing Status:   Future    Expected Date:   03/18/2024    Expiration Date:   10/27/2024      I,[name],acting as a scribe for  Doreatha Massed, MD.,have documented all relevant documentation on the behalf of Doreatha Massed, MD,as directed by  Doreatha Massed, MD while in the presence of Doreatha Massed, MD.   I, Doreatha Massed MD, have reviewed the above documentation for accuracy and completeness, and I agree with the above.   Doreatha Massed, MD   12/16/202410:01 AM  CHIEF COMPLAINT:   Diagnosis: right colon cancer    Cancer Staging  Cancer of ascending colon [facility]) Staging form: Colon and Rectum, AJCC 8th Edition - Clinical stage from 01/15/2021: Stage IIIC (cT3, cN2b, cM0) - Signed by Doreatha Massed, MD on 01/15/2021    Prior Therapy: Right hemicolectomy on 10/28/2020   Current Therapy:  surveillance    HISTORY OF PRESENT ILLNESS:   Oncology History  Cancer of ascending colon (HCC)  01/15/2021 Initial Diagnosis   Cancer of ascending colon (HCC)   01/15/2021 Cancer Staging   Staging form: Colon and Rectum, AJCC 8th Edition - Clinical stage from 01/15/2021: Stage IIIC (cT3, cN2b, cM0) - Signed by Doreatha Massed, MD on 01/15/2021 Stage prefix: Initial diagnosis Microsatellite instability (MSI): Unstable high      INTERVAL HISTORY:   [name] is a 87 y.o. female presenting to clinic today for follow up of right colon cancer. She was last seen by me on 09/19/23.  Today, she states that she is doing well overall. Her appetite level is at 80%. Her energy level is at 65%.  PAST MEDICAL HISTORY:   Past Medical History: Past Medical History:  Diagnosis Date   Anemia    Anxiety    Arthritis    CAD (coronary artery disease)    Status post CABG April 2021 - Dr. Tyrone Sage   Chronic constipation    Claudication [facility])    Chronic occlusion of the right limb of aortic stent graft.   Colon cancer (HCC)    Diverticulosis    Left sided noted on colonoscopy 02/2006   Ectopic atrial tachycardia (HCC)    Esophageal reflux disease    Essential hypertension    Hiatal hernia     History of TIA (transient ischemic attack)    Hyperlipidemia    Lumbar radiculopathy    Macular degeneration    Migraines    Penetrating atherosclerotic ulcer of aorta (HCC)    Complex ulcerated plaque of the infrarenal abdominal aorta mild renal artery stenosis on the right side, normal left renal artery catheterization 2009, status post aortic stent graft   Renal artery stenosis (HCC)    a. Right renal artery stent April 2013 - Dr. Kirke Corin;  b. 03/2014 PTA/stenting of RRA 2/2 ISR: 6x18 Herculink stent.   Trigeminal neuralgia    Right   Type 2 diabetes mellitus (HCC)     Surgical History: Past Surgical History:  Procedure Laterality Date   ABDOMINAL AORTAGRAM N/A 02/13/2012   Procedure: ABDOMINAL Ronny Flurry;  Surgeon: Iran Ouch, MD;  Location: [facility];  Service: Cardiovascular;  Laterality: N/A;  ABDOMINAL AORTAGRAM N/A 03/17/2014   Procedure: ABDOMINAL Ronny Flurry;  Surgeon: Iran Ouch, MD;  Location: [facility];  Service: Cardiovascular;  Laterality: N/A;   ABDOMINAL HYSTERECTOMY  1982   BIOPSY  09/15/2020   Procedure: BIOPSY;  Surgeon: Rachael Fee, MD;  Location: [facility] ENDOSCOPY;  Service: Endoscopy;;   BREAST BIOPSY Left 1990's   CARDIAC SURGERY     CAROTID ENDARTERECTOMY Left 1993   COLONOSCOPY     COLONOSCOPY WITH PROPOFOL N/A 09/15/2020   Procedure: COLONOSCOPY WITH PROPOFOL;  Surgeon: Rachael Fee, MD;  Location: [facility];  Service: Endoscopy;  Laterality: N/A;   CORONARY ARTERY BYPASS GRAFT N/A 03/02/2020   Procedure: CORONARY ARTERY BYPASS GRAFTING (CABG)x 3, LIMA TO LAD, SVG TO DIAGONAL, SVG TO RCA, WITH OPEN HARVESTING OF LEFT LOWER GREATER SAPHENOUS VEIN AND RIGHT AND LEFT GREATER SAPHENOUS VEIN HARVESTED ENDOSCOPICALLY;  Surgeon: Delight Ovens, MD;  Location: [facility] OR;  Service: Open Heart Surgery;  Laterality: N/A;   Endologix powerlink bifurcated     Covered stent graft   ENDOVEIN HARVEST OF GREATER SAPHENOUS VEIN Bilateral 03/02/2020    Procedure: Mack Guise Of Greater Saphenous Vein;  Surgeon: Delight Ovens, MD;  Location: [facility] OR;  Service: Open Heart Surgery;  Laterality: Bilateral;   LAPAROSCOPIC RIGHT HEMI COLECTOMY Right 10/28/2020   Procedure: LAPAROSCOPIC RIGHT HEMI COLECTOMY,;  Surgeon: Andria Meuse, MD;  Location: [facility] ORS;  Service: General;  Laterality: Right;   LEFT HEART CATH AND CORONARY ANGIOGRAPHY N/A 01/28/2020   Procedure: LEFT HEART CATH AND CORONARY ANGIOGRAPHY;  Surgeon: Yvonne Kendall, MD;  [facility]: [facility] INVAS[facility];  Service: Cardiovascular;  Laterality: N/A;   LEFT HEART CATHETERIZATION WITH CORONARY ANGIOGRAM N/A 03/17/2014   Procedure: LEFT HEART CATHETERIZATION WITH CORONARY ANGIOGRAM;  Surgeon: Iran Ouch, MD;  Location: [facility];  Service: Cardiovascular;  Laterality: N/A;   LUMBAR DISC SURGERY  1981   PERCUTANEOUS STENT INTERVENTION Right 03/17/2014   Procedure: PERCUTANEOUS STENT INTERVENTION;  Surgeon: Iran Ouch, MD;  [facility]: [facility];  Service: Cardiovascular;  Laterality: Right;  Renal   RENAL ANGIOGRAM N/A 02/13/2012   Procedure: RENAL ANGIOGRAM;  Surgeon: Iran Ouch, MD;  Location: [facility];  Service: Cardiovascular;  Laterality: N/A;   RENAL ANGIOGRAM N/A 03/17/2014   Procedure: RENAL ANGIOGRAM;  Surgeon: Iran Ouch, MD;  Location: [facility];  Service: Cardiovascular;  Laterality: N/A;   SCLEROTHERAPY  09/15/2020   Procedure: SCLEROTHERAPY;  Surgeon: Rachael Fee, MD;  Location: [facility];  Service: Endoscopy;;   TEE WITHOUT CARDIOVERSION N/A 03/02/2020   Procedure: TRANSESOPHAGEAL ECHOCARDIOGRAM (TEE);  Surgeon: Delight Ovens, MD;  Location: [facility] OR;  Service: Open Heart Surgery;  Laterality: N/A;   TONSILLECTOMY AND ADENOIDECTOMY  1947    Social History: Social History   Socioeconomic History   Marital status: Widowed    Spouse name: Not on file   Number of children: 1   Years of education: 18   Highest education  level: Not on file  Occupational History   Occupation: Retired    Comment: Solicitor at [employer]  Tobacco Use   Smoking status: Former    Current packs/day: 0.00    Average packs/day: 0.2 packs/day for 1 year (0.2 ttl pk-yrs)    Types: Cigarettes    Start date: 11/12/1978    Quit date: 11/13/1979    Years since quitting: 43.9   Smokeless tobacco: Never   Tobacco comments:    "smoked a  few months"  Vaping Use   Vaping status: Never Used  Substance and Sexual Activity   Alcohol use: No    Alcohol/week: 0.0 standard drinks of alcohol   Drug use: Never   Sexual activity: Not on file    Comment: Hysterectomy  Other Topics Concern   Not on file  Social History Narrative   Drinks 1/2 caf coffee 3-4 cups a day    Social Drivers of Health   Financial Resource Strain: Low Risk  (11/22/2020)   Overall Financial Resource Strain (CARDIA)    Difficulty of Paying Living Expenses: Not hard at all  Food Insecurity: No Food Insecurity (11/22/2020)   Hunger Vital Sign    Worried About Running Out of Food in the Last Year: Never true    Ran Out of Food in the Last Year: Never true  Transportation Needs: No Transportation Needs (11/22/2020)   PRAPARE - Administrator, Civil Service (Medical): No    Lack of Transportation (Non-Medical): No  Physical Activity: Inactive (11/22/2020)   Exercise Vital Sign    Days of Exercise per Week: 0 days    Minutes of Exercise per Session: 0 min  Stress: Stress Concern Present (11/22/2020)   [employer] of Occupational Health - Occupational Stress Questionnaire    Feeling of Stress : To some extent  Social Connections: Moderately Isolated (11/22/2020)   Social Connection and Isolation Panel [NHANES]    Frequency of Communication with Friends and Family: Three times a week    Frequency of Social Gatherings with Friends and Family: Three times a week    Attends Religious Services: More than 4 times per year    Active Member of Clubs or  Organizations: No    Attends Banker Meetings: Never    Marital Status: Widowed  Intimate Partner Violence: Not At Risk (11/22/2020)   Humiliation, Afraid, Rape, and Kick questionnaire    Fear of Current or Ex-Partner: No    Emotionally Abused: No    Physically Abused: No    Sexually Abused: No    Family History: Family History  Problem Relation Age of Onset   Heart failure Mother    Coronary artery disease Mother    Heart disease Mother    Hypertension Mother    Stroke Mother    Heart attack Father    Heart disease Father    Hypertension Father    Hypertension Sister    Parkinson's disease Sister    Migraines Sister    Kidney disease Sister    Stroke Sister    Stroke Sister    Hypertension Sister    Heart disease Brother    Hypertension Brother    Breast cancer Neg Hx     Current Medications:  Current Outpatient Medications:    acetaminophen (TYLENOL) 325 MG tablet, Take 325 mg by mouth every 4 (four) hours as needed for moderate pain., Disp: , Rfl:    amLODipine (NORVASC) 5 MG tablet, Take 7.5 mg by mouth at bedtime., Disp: , Rfl:    aspirin EC 81 MG tablet, Take 81 mg by mouth daily. Swallow whole., Disp: , Rfl:    clonazePAM (KLONOPIN) 0.5 MG tablet, Take 0.25 mg by mouth at bedtime., Disp: , Rfl:    cloNIDine (CATAPRES) 0.1 MG tablet, Take 0.1 mg by mouth daily as needed (elevated blood pressure)., Disp: , Rfl:    docusate sodium (COLACE) 100 MG capsule, Take 100 mg by mouth in the morning and at bedtime., Disp: ,  Rfl:    isosorbide mononitrate (IMDUR) 30 MG 24 hr tablet, TAKE 0.5 TABLETS BY MOUTH EVERY DAY, Disp: 45 tablet, Rfl: 3   losartan (COZAAR) 50 MG tablet, Take 1 tablet (50 mg total) by mouth daily., Disp: 30 tablet, Rfl: 3   metoprolol tartrate (LOPRESSOR) 50 MG tablet, Take 1 tablet (50 mg total) by mouth 2 (two) times daily., Disp: 180 tablet, Rfl: 3   Multiple Vitamins-Minerals (PRESERVISION AREDS 2 PO), Take 1 tablet by mouth in the morning  and at bedtime., Disp: , Rfl:    nitroGLYCERIN (NITROSTAT) 0.4 MG SL tablet, Place 0.4 mg under the tongue every 5 (five) minutes x 3 doses as needed for chest pain (if no relief after 3rd dose, proceed to ED or call 911)., Disp: , Rfl:    Omega-3 Fatty Acids (FISH OIL PO), Take 1,400 mg by mouth daily., Disp: , Rfl:    rosuvastatin (CRESTOR) 20 MG tablet, Take 1 tablet (20 mg total) by mouth at bedtime., Disp: 90 tablet, Rfl: 3   Allergies: Allergies  Allergen Reactions   Cefixime Shortness Of Breath    Patient is not familiar with this listed allergy   Conjugated Estrogens Other (See Comments)    REACTION: flushing   Morphine And Codeine Other (See Comments)    Morphine injection Neck pain   Codeine Other (See Comments)    REACTION: dizziness   Iohexol      Desc: pt has anxiety and an irregular heartbeat. contrast exacerbates this. pt was very uncomfortable after a 20cc bolus for an miroi. we did w/o iv 11/09 per dr. Eppie Gibson. pt will talk w/ md about this and will probably not get iv contrast in the future at her re, Onset Date: [id number] Multi CT's with contrast done at [facility] without pre meds, Heart Cath without pre med documented 2021. CT A/P with contrast without premeds 11/2020 without issues     Lisinopril     Cough   Adenosine Other (See Comments)    Elevated heart rate when doing stress test    REVIEW OF SYSTEMS:   Review of Systems  Constitutional:  Negative for chills, fatigue and fever.  HENT:   Negative for lump/mass, mouth sores, nosebleeds, sore throat and trouble swallowing.   Eyes:  Negative for eye problems.  Respiratory:  Positive for cough and shortness of breath.   Cardiovascular:  Negative for chest pain, leg swelling and palpitations.  Gastrointestinal:  Negative for abdominal pain, constipation, diarrhea, nausea and vomiting.  Genitourinary:  Negative for bladder incontinence, difficulty urinating, dysuria, frequency, hematuria and nocturia.   Musculoskeletal:   Negative for arthralgias, back pain, flank pain, myalgias and neck pain.  Skin:  Negative for itching and rash.  Neurological:  Positive for headaches and numbness. Negative for dizziness.  Hematological:  Does not bruise/bleed easily.  Psychiatric/Behavioral:  Negative for depression, sleep disturbance and suicidal ideas. The patient is nervous/anxious.   All other systems reviewed and are negative.    VITALS:   Blood pressure (!) 190/76, pulse 66, temperature 98 F (36.7 C), temperature source Oral, resp. rate 18, weight 125 lb 6.4 oz (56.9 kg), SpO2 100%.  Wt Readings from Last 3 Encounters:  10/28/23 125 lb 6.4 oz (56.9 kg)  09/25/23 126 lb (57.2 kg)  09/19/23 126 lb 8 oz (57.4 kg)    Body mass index is 22.94 kg/m.  Performance status (ECOG): 1 - Symptomatic but completely ambulatory  PHYSICAL EXAM:   Physical Exam Vitals and nursing note reviewed.  Exam conducted with a chaperone present.  Constitutional:      Appearance: Normal appearance.  Cardiovascular:     Rate and Rhythm: Normal rate and regular rhythm.     Pulses: Normal pulses.     Heart sounds: Normal heart sounds.  Pulmonary:     Effort: Pulmonary effort is normal.     Breath sounds: Normal breath sounds.  Abdominal:     Palpations: Abdomen is soft. There is no hepatomegaly, splenomegaly or mass.     Tenderness: There is no abdominal tenderness.  Musculoskeletal:     Right lower leg: No edema.     Left lower leg: No edema.  Lymphadenopathy:     Cervical: No cervical adenopathy.     Right cervical: No superficial, deep or posterior cervical adenopathy.    Left cervical: No superficial, deep or posterior cervical adenopathy.     Upper Body:     Right upper body: No supraclavicular or axillary adenopathy.     Left upper body: No supraclavicular or axillary adenopathy.  Neurological:     General: No focal deficit present.     Mental Status: She is alert and oriented to person, place, and time.   Psychiatric:        Mood and Affect: Mood normal.        Behavior: Behavior normal.     LABS:      Latest Ref Rng & Units 09/11/2023   10:38 AM 08/14/2023    4:43 PM 03/06/2023    1:44 PM  CBC  WBC 4.0 - 10.5 K/uL 2.3  2.7  3.5   Hemoglobin 12.0 - 15.0 g/dL 35.5  73.2  20.2   Hematocrit 36.0 - 46.0 % 39.8  42.0  44.0   Platelets 150 - 400 K/uL 190  202  205       Latest Ref Rng & Units 09/11/2023   10:38 AM 08/14/2023    4:43 PM 03/06/2023    2:36 PM  CMP  Glucose 70 - 99 mg/dL 542  706    BUN 8 - 23 mg/dL 22  21    Creatinine 2.37 - 1.00 mg/dL 6.28  3.15  1.76   Sodium 135 - 145 mmol/L 134  130    Potassium 3.5 - 5.1 mmol/L 4.0  3.8    Chloride 98 - 111 mmol/L 97  93    CO2 22 - 32 mmol/L 27  27    Calcium 8.9 - 10.3 mg/dL 16.0  73.7    Total Protein 6.5 - 8.1 g/dL 7.6     Total Bilirubin 0.3 - 1.2 mg/dL 0.9     Alkaline Phos 38 - 126 U/L 83     AST 15 - 41 U/L 24     ALT 0 - 44 U/L 23        Lab Results  Component Value Date   CEA1 2.2 09/11/2023   /  CEA  Date Value Ref Range Status  09/11/2023 2.2 0.0 - 4.7 ng/mL Final    Comment:    (NOTE)                             Nonsmokers          <3.9                             Smokers             <  5.6 Roche [employer]ostics Electrochemiluminescence Immunoassay (ECLIA) Values obtained with different assay methods or kits cannot be used interchangeably.  Results cannot be interpreted as absolute evidence of the presence or absence of malignant disease. Performed At: [facility] [address] [place], [place] [id number] Jolene Schimke MD NW:[id number]    No results found for: "PSA1" No results found for: "CAN199" No results found for: "CAN125"  Lab Results  Component Value Date   TOTALPROTELP 7.7 09/19/2023   ALBUMINELP 4.8 (H) 09/19/2023   A1GS 0.3 09/19/2023   A2GS 0.7 09/19/2023   BETS 1.1 09/19/2023   GAMS 0.8 09/19/2023   MSPIKE Not Observed 09/19/2023   SPEI Comment 09/19/2023   Lab  Results  Component Value Date   TIBC 453 (H) 09/19/2023   TIBC 414 06/07/2022   TIBC 424 11/16/2021   FERRITIN 243 09/19/2023   FERRITIN 128 06/07/2022   FERRITIN 130 11/16/2021   IRONPCTSAT 22 09/19/2023   IRONPCTSAT 24 06/07/2022   IRONPCTSAT 16 11/16/2021   Lab Results  Component Value Date   LDH 157 09/19/2023     STUDIES:   No results found.

## 2023-10-28 ENCOUNTER — Inpatient Hospital Stay: Payer: Medicare Other | Attending: Hematology | Admitting: Hematology

## 2023-10-28 VITALS — BP 190/76 | HR 66 | Temp 98.0°F | Resp 18 | Wt 125.4 lb

## 2023-10-28 DIAGNOSIS — D72819 Decreased white blood cell count, unspecified: Secondary | ICD-10-CM | POA: Diagnosis not present

## 2023-10-28 DIAGNOSIS — Z79899 Other long term (current) drug therapy: Secondary | ICD-10-CM | POA: Diagnosis not present

## 2023-10-28 DIAGNOSIS — Z87891 Personal history of nicotine dependence: Secondary | ICD-10-CM | POA: Insufficient documentation

## 2023-10-28 DIAGNOSIS — C182 Malignant neoplasm of ascending colon: Secondary | ICD-10-CM | POA: Diagnosis not present

## 2023-10-28 NOTE — Patient Instructions (Addendum)
[facility] at [facility] Discharge Instructions   You were seen and examined today by Dr. Ellin Saba.  He reviewed the results of your lab work from the previous visit. All results were normal/stable.   Dr. Kirtland Bouchard does not recommend any treatment for your low white blood cell count at this time. We will just continue to monitor your lab work periodically.   We will see you back in 5 months. We will repeat lab work prior to this visit.   Return as scheduled.    Thank you for choosing  Cancer Center at [facility] to provide your oncology and hematology care.  To afford each patient quality time with our provider, please arrive at least 15 minutes before your scheduled appointment time.   If you have a lab appointment with the Cancer Center please come in thru the Main Entrance and check in at the main information desk.  You need to re-schedule your appointment should you arrive 10 or more minutes late.  We strive to give you quality time with our providers, and arriving late affects you and other patients whose appointments are after yours.  Also, if you no show three or more times for appointments you may be dismissed from the clinic at the providers discretion.     Again, thank you for choosing [facility].  Our hope is that these requests will decrease the amount of time that you wait before being seen by our physicians.       _____________________________________________________________  Should you have questions after your visit to [facility], please contact our office at [phone] and follow the prompts.  Our office hours are 8:00 a.m. and 4:30 p.m. Monday - Friday.  Please note that voicemails left after 4:00 p.m. may not be returned until the following business day.  We are closed weekends and major holidays.  You do have access to a nurse 24-7, just call the main number to the clinic [phone] and do not press any  options, hold on the line and a nurse will answer the phone.    For prescription refill requests, have your pharmacy contact our office and allow 72 hours.    Due to Covid, you will need to wear a mask upon entering the hospital. If you do not have a mask, a mask will be given to you at the Main Entrance upon arrival. For doctor visits, patients may have 1 support person age 44 or older with them. For treatment visits, patients can not have anyone with them due to social distancing guidelines and our immunocompromised population.

## 2023-10-30 DIAGNOSIS — H02054 Trichiasis without entropian left upper eyelid: Secondary | ICD-10-CM | POA: Diagnosis not present

## 2023-10-30 DIAGNOSIS — H353132 Nonexudative age-related macular degeneration, bilateral, intermediate dry stage: Secondary | ICD-10-CM | POA: Diagnosis not present

## 2023-10-30 DIAGNOSIS — H353221 Exudative age-related macular degeneration, left eye, with active choroidal neovascularization: Secondary | ICD-10-CM | POA: Diagnosis not present

## 2023-10-30 DIAGNOSIS — H35722 Serous detachment of retinal pigment epithelium, left eye: Secondary | ICD-10-CM | POA: Diagnosis not present

## 2023-11-11 DIAGNOSIS — I10 Essential (primary) hypertension: Secondary | ICD-10-CM | POA: Diagnosis not present

## 2023-11-19 ENCOUNTER — Encounter: Payer: Self-pay | Admitting: Cardiology

## 2023-11-19 ENCOUNTER — Ambulatory Visit: Payer: Medicare Other | Attending: Cardiology | Admitting: Cardiology

## 2023-11-19 VITALS — BP 138/72 | HR 70 | Ht 62.0 in | Wt 126.8 lb

## 2023-11-19 DIAGNOSIS — I10 Essential (primary) hypertension: Secondary | ICD-10-CM | POA: Insufficient documentation

## 2023-11-19 DIAGNOSIS — I25119 Atherosclerotic heart disease of native coronary artery with unspecified angina pectoris: Secondary | ICD-10-CM | POA: Diagnosis not present

## 2023-11-19 DIAGNOSIS — I739 Peripheral vascular disease, unspecified: Secondary | ICD-10-CM | POA: Insufficient documentation

## 2023-11-19 DIAGNOSIS — E782 Mixed hyperlipidemia: Secondary | ICD-10-CM | POA: Insufficient documentation

## 2023-11-19 NOTE — Progress Notes (Signed)
    Cardiology Office Note  Date: 11/19/2023   ID: EUPHA LOBB, DOB 20-Mar-1936, MRN [id number]  History of Present Illness: [name] is an 88 y.o. female last seen in November 2024 by Ms. Strader PA-C, I reviewed the note.  She is here for a follow-up visit.  Reports no increasing angina or nitroglycerin  use.  Has had some sinus congestion recently, also a feeling of palpitations.  We went over her home blood pressure and heart rate checks today.  Other than outliers, her average trend has been better on current regimen.  I reviewed her medications.  Current cardiovascular regimen includes aspirin , Norvasc , clonidine , Imdur , Cozaar , Lopressor , Crestor , and as needed nitroglycerin .  I went over her lab work from October 2024.  She continues to follow with Dr. Rosamond.  Physical Exam: VS:  BP 138/72   Pulse 93   Ht 5' 2 (1.575 m)   Wt 126 lb 12.8 oz (57.5 kg)   SpO2 97%   BMI 23.19 kg/m , BMI Body mass index is 23.19 kg/m.  Wt Readings from Last 3 Encounters:  11/19/23 126 lb 12.8 oz (57.5 kg)  10/28/23 125 lb 6.4 oz (56.9 kg)  09/25/23 126 lb (57.2 kg)    General: Patient appears comfortable at rest. HEENT: Conjunctiva and lids normal. Lungs: Clear to auscultation, nonlabored breathing at rest. Cardiac: Regular rate and rhythm, no S3, 1/6 systolic murmur, no pericardial rub. Extremities: No pitting edema.  ECG:  An ECG dated 08/14/2023 was personally reviewed today and demonstrated:  This rhythm with left atrial enlargement and nonspecific ST-T wave abnormalities.  Labwork: 09/11/2023: ALT 23; AST 24; BUN 22; Creatinine, Ser 0.73; Hemoglobin 13.0; Platelets 190; Potassium 4.0; Sodium 134   Other Studies Reviewed Today:  No interval cardiac testing for review today.  Assessment and Plan:  1.  Multivessel CAD status post CABG in April 2021 with LIMA to LAD, SVG to diagonal, and SVG to RCA.  Follow-up Lexiscan  Myoview  in October 2021 was low risk indicating possible  small inferior scar but no ischemia and LVEF greater than 65%.  She does not report any angina or nitroglycerin  use in the interim.  Continue aspirin , Imdur , Lopressor , and Crestor .   2.  Primary hypertension.  Continues on multimodal therapy including Norvasc , clonidine , Cozaar , and Lopressor .  Tolerating current regimen.   3.  PAD.  History of infrarenal abdominal aortic penetrating ulcer status post stent graft in 2009.  Also history of renal artery stenosis status post right renal artery stent intervention in 2013 and subsequent intervention to same vessel in 2015 by Dr. Darron.   4.  Mixed hyperlipidemia.  LDL was 88 in August 2024.  Continue Crestor .   5.  Asymptomatic carotid artery disease, mild to moderate by carotid Dopplers in 2021.  Follow-up carotid Dopplers in July 2024 revealed mild bilateral ICA stenosis.  6.  Palpitations, heart rate regular today.  I have asked her to let us  know if symptoms increase and we will likely arrange a ZIO monitor.  Disposition:  Follow up  6 months, sooner if needed.  Signed, Jayson JUDITHANN Sierras, M.D., F.A.C.C. [facility]

## 2023-11-19 NOTE — Patient Instructions (Addendum)

## 2023-11-20 DIAGNOSIS — H6122 Impacted cerumen, left ear: Secondary | ICD-10-CM | POA: Diagnosis not present

## 2023-11-20 DIAGNOSIS — Z299 Encounter for prophylactic measures, unspecified: Secondary | ICD-10-CM | POA: Diagnosis not present

## 2023-11-20 DIAGNOSIS — Z6823 Body mass index (BMI) 23.0-23.9, adult: Secondary | ICD-10-CM | POA: Diagnosis not present

## 2023-11-20 DIAGNOSIS — I10 Essential (primary) hypertension: Secondary | ICD-10-CM | POA: Diagnosis not present

## 2023-11-28 ENCOUNTER — Other Ambulatory Visit: Payer: Self-pay | Admitting: Internal Medicine

## 2023-12-04 DIAGNOSIS — H353132 Nonexudative age-related macular degeneration, bilateral, intermediate dry stage: Secondary | ICD-10-CM | POA: Diagnosis not present

## 2023-12-04 DIAGNOSIS — H02836 Dermatochalasis of left eye, unspecified eyelid: Secondary | ICD-10-CM | POA: Diagnosis not present

## 2023-12-04 DIAGNOSIS — H353221 Exudative age-related macular degeneration, left eye, with active choroidal neovascularization: Secondary | ICD-10-CM | POA: Diagnosis not present

## 2023-12-04 DIAGNOSIS — H35722 Serous detachment of retinal pigment epithelium, left eye: Secondary | ICD-10-CM | POA: Diagnosis not present

## 2023-12-04 DIAGNOSIS — H02833 Dermatochalasis of right eye, unspecified eyelid: Secondary | ICD-10-CM | POA: Diagnosis not present

## 2023-12-04 DIAGNOSIS — H02054 Trichiasis without entropian left upper eyelid: Secondary | ICD-10-CM | POA: Diagnosis not present

## 2023-12-05 ENCOUNTER — Telehealth: Payer: Self-pay | Admitting: Cardiology

## 2023-12-05 MED ORDER — LOSARTAN POTASSIUM 50 MG PO TABS: 50.0000 mg | ORAL_TABLET | Freq: Every day | ORAL | 1 refills | Status: DC

## 2023-12-05 NOTE — Telephone Encounter (Signed)
*  STAT* If patient is at the pharmacy, call can be transferred to refill team.   1. Which medications need to be refilled? (please   losartan (COZAAR) 50 MG tablet (Expired)  list name of each medication and dose if known)   2. Which pharmacy/location (including street and city if local pharmacy) is medication to be sent to? [facility] - [place], [place] - [address] Phone: [phone]  Fax: [phone]      3. Do they need a 30 day or 90 day supply? 90

## 2023-12-05 NOTE — Telephone Encounter (Signed)
Filled

## 2023-12-11 DIAGNOSIS — I10 Essential (primary) hypertension: Secondary | ICD-10-CM | POA: Diagnosis not present

## 2023-12-30 ENCOUNTER — Telehealth: Payer: Self-pay | Admitting: Cardiology

## 2023-12-30 DIAGNOSIS — L821 Other seborrheic keratosis: Secondary | ICD-10-CM | POA: Diagnosis not present

## 2023-12-30 DIAGNOSIS — L820 Inflamed seborrheic keratosis: Secondary | ICD-10-CM | POA: Diagnosis not present

## 2023-12-30 DIAGNOSIS — B078 Other viral warts: Secondary | ICD-10-CM | POA: Diagnosis not present

## 2023-12-30 MED ORDER — LOSARTAN POTASSIUM 50 MG PO TABS: 50.0000 mg | ORAL_TABLET | Freq: Every day | ORAL | 5 refills | Status: DC

## 2023-12-30 NOTE — Telephone Encounter (Signed)
 Refill sent.

## 2023-12-30 NOTE — Telephone Encounter (Signed)
*  STAT* If patient is at the pharmacy, call can be transferred to refill team.   1. Which medications need to be refilled? (please list name of each medication and dose if known)   losartan (COZAAR) 50 MG tablet     2. Would you like to learn more about the convenience, safety, & potential cost savings by using the [facility]? No   3. Are you open to using the [facility] (Type [facility]. No   4. Which pharmacy/location (including street and city if local pharmacy) is medication to be sent to?  [facility] - [place], [place] - [address]     5. Do they need a 30 day or 90 day supply? 90 day supply

## 2024-01-08 DIAGNOSIS — H353132 Nonexudative age-related macular degeneration, bilateral, intermediate dry stage: Secondary | ICD-10-CM | POA: Diagnosis not present

## 2024-01-08 DIAGNOSIS — H02054 Trichiasis without entropian left upper eyelid: Secondary | ICD-10-CM | POA: Diagnosis not present

## 2024-01-08 DIAGNOSIS — H35722 Serous detachment of retinal pigment epithelium, left eye: Secondary | ICD-10-CM | POA: Diagnosis not present

## 2024-01-08 DIAGNOSIS — H02833 Dermatochalasis of right eye, unspecified eyelid: Secondary | ICD-10-CM | POA: Diagnosis not present

## 2024-01-08 DIAGNOSIS — H02836 Dermatochalasis of left eye, unspecified eyelid: Secondary | ICD-10-CM | POA: Diagnosis not present

## 2024-01-08 DIAGNOSIS — H353221 Exudative age-related macular degeneration, left eye, with active choroidal neovascularization: Secondary | ICD-10-CM | POA: Diagnosis not present

## 2024-01-09 DIAGNOSIS — H35722 Serous detachment of retinal pigment epithelium, left eye: Secondary | ICD-10-CM | POA: Diagnosis not present

## 2024-01-09 DIAGNOSIS — H02833 Dermatochalasis of right eye, unspecified eyelid: Secondary | ICD-10-CM | POA: Diagnosis not present

## 2024-01-09 DIAGNOSIS — H02836 Dermatochalasis of left eye, unspecified eyelid: Secondary | ICD-10-CM | POA: Diagnosis not present

## 2024-01-09 DIAGNOSIS — H02054 Trichiasis without entropian left upper eyelid: Secondary | ICD-10-CM | POA: Diagnosis not present

## 2024-01-09 DIAGNOSIS — H353132 Nonexudative age-related macular degeneration, bilateral, intermediate dry stage: Secondary | ICD-10-CM | POA: Diagnosis not present

## 2024-01-09 DIAGNOSIS — H353221 Exudative age-related macular degeneration, left eye, with active choroidal neovascularization: Secondary | ICD-10-CM | POA: Diagnosis not present

## 2024-01-10 DIAGNOSIS — I10 Essential (primary) hypertension: Secondary | ICD-10-CM | POA: Diagnosis not present

## 2024-02-09 DIAGNOSIS — I10 Essential (primary) hypertension: Secondary | ICD-10-CM | POA: Diagnosis not present

## 2024-02-12 DIAGNOSIS — H353132 Nonexudative age-related macular degeneration, bilateral, intermediate dry stage: Secondary | ICD-10-CM | POA: Diagnosis not present

## 2024-02-12 DIAGNOSIS — H35722 Serous detachment of retinal pigment epithelium, left eye: Secondary | ICD-10-CM | POA: Diagnosis not present

## 2024-02-12 DIAGNOSIS — H02836 Dermatochalasis of left eye, unspecified eyelid: Secondary | ICD-10-CM | POA: Diagnosis not present

## 2024-02-12 DIAGNOSIS — H353221 Exudative age-related macular degeneration, left eye, with active choroidal neovascularization: Secondary | ICD-10-CM | POA: Diagnosis not present

## 2024-02-12 DIAGNOSIS — H02833 Dermatochalasis of right eye, unspecified eyelid: Secondary | ICD-10-CM | POA: Diagnosis not present

## 2024-02-12 DIAGNOSIS — H02054 Trichiasis without entropian left upper eyelid: Secondary | ICD-10-CM | POA: Diagnosis not present

## 2024-02-20 DIAGNOSIS — I739 Peripheral vascular disease, unspecified: Secondary | ICD-10-CM | POA: Diagnosis not present

## 2024-02-20 DIAGNOSIS — I700 Atherosclerosis of aorta: Secondary | ICD-10-CM | POA: Diagnosis not present

## 2024-02-20 DIAGNOSIS — I152 Hypertension secondary to endocrine disorders: Secondary | ICD-10-CM | POA: Diagnosis not present

## 2024-02-20 DIAGNOSIS — E1159 Type 2 diabetes mellitus with other circulatory complications: Secondary | ICD-10-CM | POA: Diagnosis not present

## 2024-02-20 DIAGNOSIS — I10 Essential (primary) hypertension: Secondary | ICD-10-CM | POA: Diagnosis not present

## 2024-02-20 DIAGNOSIS — Z299 Encounter for prophylactic measures, unspecified: Secondary | ICD-10-CM | POA: Diagnosis not present

## 2024-02-20 DIAGNOSIS — I4719 Other supraventricular tachycardia: Secondary | ICD-10-CM | POA: Diagnosis not present

## 2024-02-20 DIAGNOSIS — F419 Anxiety disorder, unspecified: Secondary | ICD-10-CM | POA: Diagnosis not present

## 2024-03-18 DIAGNOSIS — H353221 Exudative age-related macular degeneration, left eye, with active choroidal neovascularization: Secondary | ICD-10-CM | POA: Diagnosis not present

## 2024-03-18 DIAGNOSIS — H353132 Nonexudative age-related macular degeneration, bilateral, intermediate dry stage: Secondary | ICD-10-CM | POA: Diagnosis not present

## 2024-03-18 DIAGNOSIS — H02833 Dermatochalasis of right eye, unspecified eyelid: Secondary | ICD-10-CM | POA: Diagnosis not present

## 2024-03-18 DIAGNOSIS — H35722 Serous detachment of retinal pigment epithelium, left eye: Secondary | ICD-10-CM | POA: Diagnosis not present

## 2024-03-18 DIAGNOSIS — H35721 Serous detachment of retinal pigment epithelium, right eye: Secondary | ICD-10-CM | POA: Diagnosis not present

## 2024-03-18 DIAGNOSIS — H02054 Trichiasis without entropian left upper eyelid: Secondary | ICD-10-CM | POA: Diagnosis not present

## 2024-03-18 DIAGNOSIS — H02836 Dermatochalasis of left eye, unspecified eyelid: Secondary | ICD-10-CM | POA: Diagnosis not present

## 2024-03-23 ENCOUNTER — Inpatient Hospital Stay: Payer: Medicare Other | Attending: Hematology

## 2024-03-23 DIAGNOSIS — Z08 Encounter for follow-up examination after completed treatment for malignant neoplasm: Secondary | ICD-10-CM | POA: Insufficient documentation

## 2024-03-23 DIAGNOSIS — Z85038 Personal history of other malignant neoplasm of large intestine: Secondary | ICD-10-CM | POA: Diagnosis not present

## 2024-03-23 DIAGNOSIS — C182 Malignant neoplasm of ascending colon: Secondary | ICD-10-CM

## 2024-03-23 DIAGNOSIS — D72819 Decreased white blood cell count, unspecified: Secondary | ICD-10-CM | POA: Diagnosis not present

## 2024-03-23 LAB — CBC WITH DIFFERENTIAL/PLATELET
Abs Immature Granulocytes: 0 10*3/uL (ref 0.00–0.07)
Basophils Absolute: 0 10*3/uL (ref 0.0–0.1)
Basophils Relative: 0 %
Eosinophils Absolute: 0.1 10*3/uL (ref 0.0–0.5)
Eosinophils Relative: 3 %
HCT: 38.3 % (ref 36.0–46.0)
Hemoglobin: 12.7 g/dL (ref 12.0–15.0)
Lymphocytes Relative: 40 %
Lymphs Abs: 0.8 10*3/uL (ref 0.7–4.0)
MCH: 29.4 pg (ref 26.0–34.0)
MCHC: 33.2 g/dL (ref 30.0–36.0)
MCV: 88.7 fL (ref 80.0–100.0)
Monocytes Absolute: 0 10*3/uL — ABNORMAL LOW (ref 0.1–1.0)
Monocytes Relative: 1 %
Neutro Abs: 1.1 10*3/uL — ABNORMAL LOW (ref 1.7–7.7)
Neutrophils Relative %: 56 %
Platelets: 174 10*3/uL (ref 150–400)
RBC: 4.32 MIL/uL (ref 3.87–5.11)
RDW: 13.9 % (ref 11.5–15.5)
WBC: 2 10*3/uL — ABNORMAL LOW (ref 4.0–10.5)
nRBC: 0 % (ref 0.0–0.2)

## 2024-03-23 LAB — COMPREHENSIVE METABOLIC PANEL WITH GFR
ALT: 28 U/L (ref 0–44)
AST: 30 U/L (ref 15–41)
Albumin: 4.5 g/dL (ref 3.5–5.0)
Alkaline Phosphatase: 103 U/L (ref 38–126)
Anion gap: 8 (ref 5–15)
BUN: 27 mg/dL — ABNORMAL HIGH (ref 8–23)
CO2: 26 mmol/L (ref 22–32)
Calcium: 10.2 mg/dL (ref 8.9–10.3)
Chloride: 100 mmol/L (ref 98–111)
Creatinine, Ser: 0.72 mg/dL (ref 0.44–1.00)
GFR, Estimated: >60 mL/min (ref >60)
Glucose, Bld: 166 mg/dL — ABNORMAL HIGH (ref 70–99)
Potassium: 4 mmol/L (ref 3.5–5.1)
Sodium: 134 mmol/L — ABNORMAL LOW (ref 135–145)
Total Bilirubin: 0.6 mg/dL (ref 0.0–1.2)
Total Protein: 7.4 g/dL (ref 6.5–8.1)

## 2024-03-24 LAB — KAPPA/LAMBDA LIGHT CHAINS
Kappa free light chain: 20.6 mg/L — ABNORMAL HIGH (ref 3.3–19.4)
Kappa, lambda light chain ratio: 1.84 — ABNORMAL HIGH (ref 0.26–1.65)
Lambda free light chains: 11.2 mg/L (ref 5.7–26.3)

## 2024-03-24 LAB — CEA: CEA: 2.3 ng/mL (ref 0.0–4.7)

## 2024-03-25 LAB — METHYLMALONIC ACID, SERUM: Methylmalonic Acid, Quantitative: 284 nmol/L (ref 0–378)

## 2024-03-26 LAB — IMMUNOFIXATION ELECTROPHORESIS
IgA: 208 mg/dL (ref 64–422)
IgG (Immunoglobin G), Serum: 831 mg/dL (ref 586–1602)
IgM (Immunoglobulin M), Srm: 80 mg/dL (ref 26–217)
Total Protein ELP: 7.1 g/dL (ref 6.0–8.5)

## 2024-03-30 ENCOUNTER — Inpatient Hospital Stay: Payer: Medicare Other | Admitting: Hematology

## 2024-03-30 VITALS — BP 192/82 | HR 74 | Temp 98.6°F | Resp 18 | Wt 125.9 lb

## 2024-03-30 DIAGNOSIS — Z08 Encounter for follow-up examination after completed treatment for malignant neoplasm: Secondary | ICD-10-CM | POA: Diagnosis not present

## 2024-03-30 DIAGNOSIS — Z85038 Personal history of other malignant neoplasm of large intestine: Secondary | ICD-10-CM | POA: Diagnosis not present

## 2024-03-30 DIAGNOSIS — E611 Iron deficiency: Secondary | ICD-10-CM

## 2024-03-30 DIAGNOSIS — C182 Malignant neoplasm of ascending colon: Secondary | ICD-10-CM

## 2024-03-30 DIAGNOSIS — D72819 Decreased white blood cell count, unspecified: Secondary | ICD-10-CM | POA: Diagnosis not present

## 2024-03-30 DIAGNOSIS — D709 Neutropenia, unspecified: Secondary | ICD-10-CM

## 2024-03-30 NOTE — Progress Notes (Signed)
 [facility] [address], [place] [address]    Clinic Day:  03/30/2024  Referring physician: Orlena Bitters, MD  Patient Care Team: Orlena Bitters, MD as PCP - General (Internal Medicine) Gerard Knight, MD as PCP - Cardiology (Cardiology) Shermon Divine, MD as Consulting Physician (Sports Medicine) Norita Beauvais, MD (Inactive) as Consulting Physician (Cardiothoracic Surgery) Gerhard Knuckles, RN as Oncology Nurse Navigator (Oncology)   ASSESSMENT & PLAN:   Assessment: 1.  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 - CT AP with contrast on 08/10/2020 with abnormal appearance of right colon with colocolonic intussusception with wall thickening and abnormally enlarged adjacent right mesenteric lymph nodes. - CT chest with contrast on 09/07/2020 with no evidence of metastatic disease in the chest. -Colonoscopy on 09/15/2020-large spherical, ulcerated, partially necrotic tumor in the transverse/right colon.  Scope could not be maneuvered more proximally to the tumor. - Right hemicolectomy on 10/28/2020 by Dr. Camilo Cella. - Pathology shows invasive moderately to poorly differentiated carcinoma involving the mid ascending colon, spanning 10 cm, tumor invades through muscularis propria into pericolorectal tissue, margins negative.  9/39 lymph nodes involved.  Lymphovascular invasion present.  PT3PN2B. - Loss of MLH1 and PMS2. - BRAF V600 E mutation positive.  MSI-high. -She has refused adjuvant chemotherapy.   2.  Social/family history: - She is accompanied by her daughter today.  Lives by herself and is independent of ADLs and IADLs.  Worked as a Scientist, physiological. - Maternal aunt had ovarian cancer.  No other malignancies.    Plan: 1.  Stage IIIc (PT3PN2B) adenocarcinoma of ascending colon: - She denies any change in bowel habits or bleeding per rectum. - Last CTAP on 09/11/2023: No  evidence of recurrence or metastatic disease. - Labs from 03/23/2024: Normal LFTs.  CEA was normal at 2.3. - RTC 6 months for follow-up with repeat CTAP with contrast and CEA level.   2.  Mild hypercalcemia: - She has long history of on and off mild hypercalcemia since 2015.  Vitamin D  supplements were discontinued in May 2024.  She is not on any calcium  supplements.  Calcium  is 10.2 today.   3.  Leukopenia and neutropenia: - She has leukopenia since beginning of 2023 with no recurrent infections.  Her ANC is gradually getting worse. - CTAP did not show splenomegaly.  Multiple myeloma panel was negative except mildly elevated FLC ratio of 1.84 and kappa light chains of 20.6. - Flow cytometry did not show any abnormal B or T-cell population. - Differential diagnosis includes MDS.  I have recommended a bone marrow biopsy for further evaluation.  She said she would think about it and let us  know. - I will also do NGS myeloid panel at next visit.    Orders Placed This Encounter  Procedures   CT ABDOMEN PELVIS W CONTRAST    Standing Status:   Future    Expected Date:   09/30/2024    Expiration Date:   03/30/2025    If indicated for the ordered procedure, I authorize the administration of contrast media per Radiology protocol:   Yes    Does the patient have a contrast media/X-ray dye allergy?:   No    Preferred imaging location?:   [facility]    Release to patient:   Immediate [1]    If indicated for the ordered procedure, I authorize the administration of oral contrast media per Radiology protocol:  Yes   CBC with Differential    Standing Status:   Future    Expected Date:   09/28/2024    Expiration Date:   03/30/2025   Comprehensive metabolic panel    Standing Status:   Future    Expected Date:   09/28/2024    Expiration Date:   03/30/2025   Lactate dehydrogenase    Standing Status:   Future    Expected Date:   09/28/2024    Expiration Date:   03/30/2025   Immunofixation  electrophoresis    Standing Status:   Future    Expected Date:   09/28/2024    Expiration Date:   03/30/2025   Kappa/lambda light chains    Standing Status:   Future    Expected Date:   09/28/2024    Expiration Date:   03/30/2025   CEA    Standing Status:   Future    Expected Date:   09/28/2024    Expiration Date:   03/30/2025   Miscellaneous [facility] test (send-out)    Standing Status:   Future    Expected Date:   09/28/2024    Expiration Date:   03/30/2025    Test name / description::   Myeloid NGS - test code 161096      I,Helena R Teague,acting as a scribe for Paulett Boros, MD.,have documented all relevant documentation on the behalf of Paulett Boros, MD,as directed by  Paulett Boros, MD while in the presence of Paulett Boros, MD.  I, Paulett Boros MD, have reviewed the above documentation for accuracy and completeness, and I agree with the above.    Paulett Boros, MD   5/19/20253:28 PM  CHIEF COMPLAINT:   Diagnosis: right colon cancer    Cancer Staging  Cancer of ascending colon [facility]) Staging form: Colon and Rectum, AJCC 8th Edition - Clinical stage from 01/15/2021: Stage IIIC (cT3, cN2b, cM0) - Signed by Paulett Boros, MD on 01/15/2021    Prior Therapy: Right hemicolectomy on 10/28/2020   Current Therapy:  surveillance    HISTORY OF PRESENT ILLNESS:   Oncology History  Cancer of ascending colon (HCC)  01/15/2021 Initial Diagnosis   Cancer of ascending colon (HCC)   01/15/2021 Cancer Staging   Staging form: Colon and Rectum, AJCC 8th Edition - Clinical stage from 01/15/2021: Stage IIIC (cT3, cN2b, cM0) - Signed by Paulett Boros, MD on 01/15/2021 Stage prefix: Initial diagnosis Microsatellite instability (MSI): Unstable high      INTERVAL HISTORY:   [name] is a 88 y.o. female presenting to clinic today for follow up of right colon cancer. She was last seen by me on 10/28/23.  Today, she states that she is doing well  overall. Her appetite level is at 80%. Her energy level is at 75%.  PAST MEDICAL HISTORY:   Past Medical History: Past Medical History:  Diagnosis Date   Anemia    Anxiety    Arthritis    CAD (coronary artery disease)    Status post CABG April 2021 - Dr. Nicanor Barge   Chronic constipation    Claudication [facility])    Chronic occlusion of the right limb of aortic stent graft.   Colon cancer (HCC)    Diverticulosis    Left sided noted on colonoscopy 02/2006   Ectopic atrial tachycardia (HCC)    Esophageal reflux disease    Essential hypertension    Hiatal hernia    History of TIA (transient ischemic attack)    Hyperlipidemia    Lumbar radiculopathy  Macular degeneration    Migraines    Penetrating atherosclerotic ulcer of aorta (HCC)    Complex ulcerated plaque of the infrarenal abdominal aorta mild renal artery stenosis on the right side, normal left renal artery catheterization 2009, status post aortic stent graft   Renal artery stenosis (HCC)    a. Right renal artery stent April 2013 - Dr. Alvenia Aus;  b. 03/2014 PTA/stenting of RRA 2/2 ISR: 6x18 Herculink stent.   Trigeminal neuralgia    Right   Type 2 diabetes mellitus (HCC)     Surgical History: Past Surgical History:  Procedure Laterality Date   ABDOMINAL AORTAGRAM N/A 02/13/2012   Procedure: ABDOMINAL Tommi Fraise;  Surgeon: Wenona Hamilton, MD;  Location: [facility] CATH [facility];  Service: Cardiovascular;  Laterality: N/A;   ABDOMINAL AORTAGRAM N/A 03/17/2014   Procedure: ABDOMINAL Tommi Fraise;  Surgeon: Wenona Hamilton, MD;  Location: [facility] CATH [facility];  Service: Cardiovascular;  Laterality: N/A;   ABDOMINAL HYSTERECTOMY  1982   BIOPSY  09/15/2020   Procedure: BIOPSY;  Surgeon: Janel Medford, MD;  Location: [facility] ENDOSCOPY;  Service: Endoscopy;;   BREAST BIOPSY Left 1990's   CARDIAC SURGERY     CAROTID ENDARTERECTOMY Left 1993   COLONOSCOPY     COLONOSCOPY WITH PROPOFOL  N/A 09/15/2020   Procedure: COLONOSCOPY WITH PROPOFOL ;  Surgeon:  Janel Medford, MD;  Location: [facility] ENDOSCOPY;  Service: Endoscopy;  Laterality: N/A;   CORONARY ARTERY BYPASS GRAFT N/A 03/02/2020   Procedure: CORONARY ARTERY BYPASS GRAFTING (CABG)x 3, LIMA TO LAD, SVG TO DIAGONAL, SVG TO RCA, WITH OPEN HARVESTING OF LEFT LOWER GREATER SAPHENOUS VEIN AND RIGHT AND LEFT GREATER SAPHENOUS VEIN HARVESTED ENDOSCOPICALLY;  Surgeon: Norita Beauvais, MD;  Location: [facility] OR;  Service: Open Heart Surgery;  Laterality: N/A;   Endologix powerlink bifurcated     Covered stent graft   ENDOVEIN HARVEST OF GREATER SAPHENOUS VEIN Bilateral 03/02/2020   Procedure: Astrid Blamer Of Greater Saphenous Vein;  Surgeon: Norita Beauvais, MD;  Location: [facility] OR;  Service: Open Heart Surgery;  Laterality: Bilateral;   LAPAROSCOPIC RIGHT HEMI COLECTOMY Right 10/28/2020   Procedure: LAPAROSCOPIC RIGHT HEMI COLECTOMY,;  Surgeon: Melvenia Stabs, MD;  Location: [facility] ORS;  Service: General;  Laterality: Right;   LEFT HEART CATH AND CORONARY ANGIOGRAPHY N/A 01/28/2020   Procedure: LEFT HEART CATH AND CORONARY ANGIOGRAPHY;  Surgeon: Sammy Crisp, MD;  Location: [facility] INVASIVE CV LAB;  Service: Cardiovascular;  Laterality: N/A;   LEFT HEART CATHETERIZATION WITH CORONARY ANGIOGRAM N/A 03/17/2014   Procedure: LEFT HEART CATHETERIZATION WITH CORONARY ANGIOGRAM;  Surgeon: Wenona Hamilton, MD;  Location: [facility];  Service: Cardiovascular;  Laterality: N/A;   LUMBAR DISC SURGERY  1981   PERCUTANEOUS STENT INTERVENTION Right 03/17/2014   Procedure: PERCUTANEOUS STENT INTERVENTION;  Surgeon: Wenona Hamilton, MD;  Location: [facility];  Service: Cardiovascular;  Laterality: Right;  Renal   RENAL ANGIOGRAM N/A 02/13/2012   Procedure: RENAL ANGIOGRAM;  Surgeon: Wenona Hamilton, MD;  Location: [facility];  Service: Cardiovascular;  Laterality: N/A;   RENAL ANGIOGRAM N/A 03/17/2014   Procedure: RENAL ANGIOGRAM;  Surgeon: Wenona Hamilton, MD;  Location: [facility];  Service:  Cardiovascular;  Laterality: N/A;   SCLEROTHERAPY  09/15/2020   Procedure: SCLEROTHERAPY;  Surgeon: Janel Medford, MD;  Location: [facility];  Service: Endoscopy;;   TEE WITHOUT CARDIOVERSION N/A 03/02/2020   Procedure: TRANSESOPHAGEAL ECHOCARDIOGRAM (TEE);  Surgeon: Norita Beauvais, MD;  Location: [facility] OR;  Service: Open Heart Surgery;  Laterality: N/A;   TONSILLECTOMY AND ADENOIDECTOMY  1947    Social History: Social History   Socioeconomic History   Marital status: Widowed    Spouse name: Not on file   Number of children: 1   Years of education: 1   Highest education level: Not on file  Occupational History   Occupation: Retired    Comment: Solicitor at [employer]  Tobacco Use   Smoking status: Former    Current packs/day: 0.00    Average packs/day: 0.2 packs/day for 1 year (0.2 ttl pk-yrs)    Types: Cigarettes    Start date: 11/12/1978    Quit date: 11/13/1979    Years since quitting: 44.4   Smokeless tobacco: Never   Tobacco comments:    "smoked a few months"  Vaping Use   Vaping status: Never Used  Substance and Sexual Activity   Alcohol  use: No    Alcohol /week: 0.0 standard drinks of alcohol    Drug use: Never   Sexual activity: Not on file    Comment: Hysterectomy  Other Topics Concern   Not on file  Social History Narrative   Drinks 1/2 caf coffee 3-4 cups a day    Social Drivers of Health   Financial Resource Strain: Low Risk  (11/22/2020)   Overall Financial Resource Strain (CARDIA)    Difficulty of Paying Living Expenses: Not hard at all  Food Insecurity: No Food Insecurity (11/22/2020)   Hunger Vital Sign    Worried About Running Out of Food in the Last Year: Never true    Ran Out of Food in the Last Year: Never true  Transportation Needs: No Transportation Needs (11/22/2020)   PRAPARE - Administrator, Civil Service (Medical): No    Lack of Transportation (Non-Medical): No  Physical Activity: Inactive (11/22/2020)   Exercise Vital Sign     Days of Exercise per Week: 0 days    Minutes of Exercise per Session: 0 min  Stress: Stress Concern Present (11/22/2020)   [employer] of Occupational Health - Occupational Stress Questionnaire    Feeling of Stress : To some extent  Social Connections: Moderately Isolated (11/22/2020)   Social Connection and Isolation Panel [NHANES]    Frequency of Communication with Friends and Family: Three times a week    Frequency of Social Gatherings with Friends and Family: Three times a week    Attends Religious Services: More than 4 times per year    Active Member of Clubs or Organizations: No    Attends Banker Meetings: Never    Marital Status: Widowed  Intimate Partner Violence: Not At Risk (11/22/2020)   Humiliation, Afraid, Rape, and Kick questionnaire    Fear of Current or Ex-Partner: No    Emotionally Abused: No    Physically Abused: No    Sexually Abused: No    Family History: Family History  Problem Relation Age of Onset   Heart failure Mother    Coronary artery disease Mother    Heart disease Mother    Hypertension Mother    Stroke Mother    Heart attack Father    Heart disease Father    Hypertension Father    Hypertension Sister    Parkinson's disease Sister    Migraines Sister    Kidney disease Sister    Stroke Sister    Stroke Sister    Hypertension Sister    Heart disease Brother    Hypertension Brother    Breast cancer Neg Hx  Current Medications:  Current Outpatient Medications:    acetaminophen  (TYLENOL ) 325 MG tablet, Take 325 mg by mouth every 4 (four) hours as needed for moderate pain., Disp: , Rfl:    amLODipine  (NORVASC ) 5 MG tablet, Take 7.5 mg by mouth at bedtime., Disp: , Rfl:    aspirin  EC 81 MG tablet, Take 81 mg by mouth daily. Swallow whole., Disp: , Rfl:    clonazePAM  (KLONOPIN ) 0.5 MG tablet, Take 0.25 mg by mouth at bedtime., Disp: , Rfl:    cloNIDine  (CATAPRES ) 0.1 MG tablet, Take 0.1 mg by mouth daily as needed (elevated  blood pressure)., Disp: , Rfl:    docusate sodium  (COLACE) 100 MG capsule, Take 100 mg by mouth in the morning and at bedtime., Disp: , Rfl:    isosorbide  mononitrate (IMDUR ) 30 MG 24 hr tablet, TAKE 0.5 TABLETS BY MOUTH EVERY DAY, Disp: 45 tablet, Rfl: 3   metoprolol  tartrate (LOPRESSOR ) 50 MG tablet, Take 1 tablet (50 mg total) by mouth 2 (two) times daily., Disp: 180 tablet, Rfl: 3   Multiple Vitamins-Minerals (PRESERVISION AREDS 2 PO), Take 1 tablet by mouth in the morning and at bedtime., Disp: , Rfl:    nitroGLYCERIN  (NITROSTAT ) 0.4 MG SL tablet, Place 0.4 mg under the tongue every 5 (five) minutes x 3 doses as needed for chest pain (if no relief after 3rd dose, proceed to ED or call 911)., Disp: , Rfl:    rosuvastatin  (CRESTOR ) 20 MG tablet, Take 1 tablet (20 mg total) by mouth at bedtime., Disp: 90 tablet, Rfl: 3   losartan  (COZAAR ) 50 MG tablet, Take 1 tablet (50 mg total) by mouth daily., Disp: 30 tablet, Rfl: 5   Allergies: Allergies  Allergen Reactions   Cefixime Shortness Of Breath    Patient is not familiar with this listed allergy   Conjugated Estrogens Other (See Comments)    REACTION: flushing   Morphine And Codeine Other (See Comments)    Morphine injection Neck pain   Codeine Other (See Comments)    REACTION: dizziness   Iohexol       Desc: pt has anxiety and an irregular heartbeat. contrast exacerbates this. pt was very uncomfortable after a 20cc bolus for an miroi. we did w/o iv 11/09 per dr. Gaines Joy. pt will talk w/ md about this and will probably not get iv contrast in the future at her re, Onset Date: [id number] Multi CT's with contrast done at [facility] without pre meds, Heart Cath without pre med documented 2021. CT A/P with contrast without premeds 11/2020 without issues     Lisinopril      Cough   Adenosine Other (See Comments)    Elevated heart rate when doing stress test    REVIEW OF SYSTEMS:   Review of Systems  Constitutional:  Negative for chills, fatigue and  fever.  HENT:   Negative for lump/mass, mouth sores, nosebleeds, sore throat and trouble swallowing.   Eyes:  Negative for eye problems.  Respiratory:  Positive for cough. Negative for shortness of breath.   Cardiovascular:  Positive for palpitations. Negative for chest pain and leg swelling.  Gastrointestinal:  Negative for abdominal pain, constipation, diarrhea, nausea and vomiting.  Genitourinary:  Negative for bladder incontinence, difficulty urinating, dysuria, frequency, hematuria and nocturia.   Musculoskeletal:  Negative for arthralgias, back pain, flank pain, myalgias and neck pain.  Skin:  Negative for itching and rash.  Neurological:  Positive for dizziness and headaches. Negative for numbness.  Hematological:  Does not bruise/bleed easily.  Psychiatric/Behavioral:  Negative for depression, sleep disturbance and suicidal ideas. The patient is not nervous/anxious.   All other systems reviewed and are negative.    VITALS:   Blood pressure (!) 192/82, pulse 74, temperature 98.6 F (37 C), temperature source Tympanic, resp. rate 18, weight 125 lb 14.1 oz (57.1 kg), SpO2 98%.  Wt Readings from Last 3 Encounters:  03/30/24 125 lb 14.1 oz (57.1 kg)  11/19/23 126 lb 12.8 oz (57.5 kg)  10/28/23 125 lb 6.4 oz (56.9 kg)    Body mass index is 23.02 kg/m.  Performance status (ECOG): 1 - Symptomatic but completely ambulatory  PHYSICAL EXAM:   Physical Exam Vitals and nursing note reviewed. Exam conducted with a chaperone present.  Constitutional:      Appearance: Normal appearance.  Cardiovascular:     Rate and Rhythm: Normal rate and regular rhythm.     Pulses: Normal pulses.     Heart sounds: Normal heart sounds.  Pulmonary:     Effort: Pulmonary effort is normal.     Breath sounds: Normal breath sounds.  Abdominal:     Palpations: Abdomen is soft. There is no hepatomegaly, splenomegaly or mass.     Tenderness: There is no abdominal tenderness.  Musculoskeletal:     Right  lower leg: No edema.     Left lower leg: No edema.  Lymphadenopathy:     Cervical: No cervical adenopathy.     Right cervical: No superficial, deep or posterior cervical adenopathy.    Left cervical: No superficial, deep or posterior cervical adenopathy.     Upper Body:     Right upper body: No supraclavicular or axillary adenopathy.     Left upper body: No supraclavicular or axillary adenopathy.  Neurological:     General: No focal deficit present.     Mental Status: She is alert and oriented to person, place, and time.  Psychiatric:        Mood and Affect: Mood normal.        Behavior: Behavior normal.     LABS:      Latest Ref Rng & Units 03/23/2024    2:09 PM 09/11/2023   10:38 AM 08/14/2023    4:43 PM  CBC  WBC 4.0 - 10.5 K/uL 2.0  2.3  2.7   Hemoglobin 12.0 - 15.0 g/dL 04.5  40.9  81.1   Hematocrit 36.0 - 46.0 % 38.3  39.8  42.0   Platelets 150 - 400 K/uL 174  190  202       Latest Ref Rng & Units 03/23/2024    2:09 PM 09/11/2023   10:38 AM 08/14/2023    4:43 PM  CMP  Glucose 70 - 99 mg/dL 914  782  956   BUN 8 - 23 mg/dL 27  22  21    Creatinine 0.44 - 1.00 mg/dL 2.13  0.86  5.78   Sodium 135 - 145 mmol/L 134  134  130   Potassium 3.5 - 5.1 mmol/L 4.0  4.0  3.8   Chloride 98 - 111 mmol/L 100  97  93   CO2 22 - 32 mmol/L 26  27  27    Calcium  8.9 - 10.3 mg/dL 46.9  62.9  52.8   Total Protein 6.5 - 8.1 g/dL 7.4  7.6    Total Bilirubin 0.0 - 1.2 mg/dL 0.6  0.9    Alkaline Phos 38 - 126 U/L 103  83    AST 15 - 41 U/L 30  24  ALT 0 - 44 U/L 28  23       Lab Results  Component Value Date   CEA1 2.3 03/23/2024   /  CEA  Date Value Ref Range Status  03/23/2024 2.3 0.0 - 4.7 ng/mL Final    Comment:    (NOTE)                             Nonsmokers          <3.9                             Smokers             <5.6 Roche [employer]ostics Electrochemiluminescence Immunoassay (ECLIA) Values obtained with different assay methods or kits cannot be used  interchangeably.  Results cannot be interpreted as absolute evidence of the presence or absence of malignant disease. Performed At: [facility] [address] [place], [place] [id number] Pearlean Botts MD OZ:[id number]    No results found for: "PSA1" No results found for: "CAN199" No results found for: "CAN125"  Lab Results  Component Value Date   TOTALPROTELP 7.1 03/23/2024   ALBUMINELP 4.8 (H) 09/19/2023   A1GS 0.3 09/19/2023   A2GS 0.7 09/19/2023   BETS 1.1 09/19/2023   GAMS 0.8 09/19/2023   MSPIKE Not Observed 09/19/2023   SPEI Comment 09/19/2023   Lab Results  Component Value Date   TIBC 453 (H) 09/19/2023   TIBC 414 06/07/2022   TIBC 424 11/16/2021   FERRITIN 243 09/19/2023   FERRITIN 128 06/07/2022   FERRITIN 130 11/16/2021   IRONPCTSAT 22 09/19/2023   IRONPCTSAT 24 06/07/2022   IRONPCTSAT 16 11/16/2021   Lab Results  Component Value Date   LDH 157 09/19/2023     STUDIES:   No results found.

## 2024-03-30 NOTE — Patient Instructions (Addendum)
 [facility] at [facility] Discharge Instructions   You were seen and examined today by Dr. Cheree Cords.  He reviewed the results of your lab work which are normal/stable. Your white blood cell count remains low. The only way to determine what exactly is causing this is by having a bone marrow biopsy. This could be coming from a condition called MDS (myelodysplastic syndrome). Call us  if you decide you would want to do the bone marrow biopsy.   We will see you back in 6 months. We will repeat lab work prior to this visit.   Return as scheduled.    Thank you for choosing  Cancer Center at [facility] to provide your oncology and hematology care.  To afford each patient quality time with our provider, please arrive at least 15 minutes before your scheduled appointment time.   If you have a lab appointment with the Cancer Center please come in thru the Main Entrance and check in at the main information desk.  You need to re-schedule your appointment should you arrive 10 or more minutes late.  We strive to give you quality time with our providers, and arriving late affects you and other patients whose appointments are after yours.  Also, if you no show three or more times for appointments you may be dismissed from the clinic at the providers discretion.     Again, [facility] you for [facility].  Our hope is that these requests will decrease the amount of time that you wait before being seen by our physicians.       _____________________________________________________________  Should you have questions after your visit to [facility], please contact our office at [phone] and follow the prompts.  Our office hours are 8:00 a.m. and 4:30 p.m. Monday - Friday.  Please note that voicemails left after 4:00 p.m. may not be returned until the following business day.  We are closed weekends and major holidays.  You do have access  to a nurse 24-7, just call the main number to the clinic [phone] and do not press any options, hold on the line and a nurse will answer the phone.    For prescription refill requests, have your pharmacy contact our office and allow 72 hours.    Due to Covid, you will need to wear a mask upon entering the hospital. If you do not have a mask, a mask will be given to you at the Main Entrance upon arrival. For doctor visits, patients may have 1 support person age 31 or older with them. For treatment visits, patients can not have anyone with them due to social distancing guidelines and our immunocompromised population.

## 2024-04-02 ENCOUNTER — Other Ambulatory Visit: Payer: Self-pay | Admitting: Cardiology

## 2024-04-11 DIAGNOSIS — I10 Essential (primary) hypertension: Secondary | ICD-10-CM | POA: Diagnosis not present

## 2024-04-29 DIAGNOSIS — H353132 Nonexudative age-related macular degeneration, bilateral, intermediate dry stage: Secondary | ICD-10-CM | POA: Diagnosis not present

## 2024-04-29 DIAGNOSIS — H35721 Serous detachment of retinal pigment epithelium, right eye: Secondary | ICD-10-CM | POA: Diagnosis not present

## 2024-04-29 DIAGNOSIS — H35722 Serous detachment of retinal pigment epithelium, left eye: Secondary | ICD-10-CM | POA: Diagnosis not present

## 2024-04-29 DIAGNOSIS — H02836 Dermatochalasis of left eye, unspecified eyelid: Secondary | ICD-10-CM | POA: Diagnosis not present

## 2024-04-29 DIAGNOSIS — H353221 Exudative age-related macular degeneration, left eye, with active choroidal neovascularization: Secondary | ICD-10-CM | POA: Diagnosis not present

## 2024-04-29 DIAGNOSIS — H02833 Dermatochalasis of right eye, unspecified eyelid: Secondary | ICD-10-CM | POA: Diagnosis not present

## 2024-04-29 DIAGNOSIS — H02054 Trichiasis without entropian left upper eyelid: Secondary | ICD-10-CM | POA: Diagnosis not present

## 2024-05-28 ENCOUNTER — Encounter: Payer: Self-pay | Admitting: Cardiology

## 2024-05-28 ENCOUNTER — Ambulatory Visit: Attending: Cardiology | Admitting: Cardiology

## 2024-05-28 VITALS — BP 142/88 | HR 73 | Ht 62.0 in | Wt 125.4 lb

## 2024-05-28 DIAGNOSIS — I10 Essential (primary) hypertension: Secondary | ICD-10-CM | POA: Diagnosis not present

## 2024-05-28 DIAGNOSIS — I25119 Atherosclerotic heart disease of native coronary artery with unspecified angina pectoris: Secondary | ICD-10-CM | POA: Diagnosis not present

## 2024-05-28 DIAGNOSIS — E782 Mixed hyperlipidemia: Secondary | ICD-10-CM | POA: Diagnosis not present

## 2024-05-28 DIAGNOSIS — I739 Peripheral vascular disease, unspecified: Secondary | ICD-10-CM | POA: Diagnosis not present

## 2024-05-28 MED ORDER — LOSARTAN POTASSIUM 50 MG PO TABS: 50.0000 mg | ORAL_TABLET | Freq: Every day | ORAL | 6 refills | Status: DC

## 2024-05-28 NOTE — Progress Notes (Signed)
    Cardiology Office Note  Date: 05/28/2024   ID: [name], DOB [birth year], MRN [id number]  History of Present Illness: [name] is an 88 y.o. female last seen in January.  She is here for a routine visit.  Overall states that she has done reasonably well since last encounter.  No increasing angina or interval nitroglycerin  use.  She has had trouble with chronic headaches.  No palpitations or syncope/falls.  We went over her medications.  She reports compliance with current regimen.  Does check blood pressure at home, average systolic around 140 per review.  She only uses clonidine  as needed.  I reviewed her interval lab work.  She continues to follow-up with Dr. Rosamond.  Physical Exam: VS:  BP (!) 142/88 (BP Location: Left Arm)   Pulse 73   Ht 5' 2 (1.575 m)   Wt 125 lb 6.4 oz (56.9 kg)   SpO2 97%   BMI 22.94 kg/m , BMI Body mass index is 22.94 kg/m.  Wt Readings from Last 3 Encounters:  05/28/24 125 lb 6.4 oz (56.9 kg)  03/30/24 125 lb 14.1 oz (57.1 kg)  11/19/23 126 lb 12.8 oz (57.5 kg)    General: Patient appears comfortable at rest. HEENT: Conjunctiva and lids normal. Neck: Supple, no elevated JVP or carotid bruits. Lungs: Clear to auscultation, nonlabored breathing at rest. Cardiac: Regular rate and rhythm, no S3, 1/6 systolic murmur. Extremities: No pitting edema.  ECG:  An ECG dated 08/14/2023 was personally reviewed today and demonstrated:  Sinus rhythm with left atrial enlargement and nonspecific ST-T changes.  Labwork: July 2024: Cholesterol 171, triglycerides 155, HDL 56, LDL 88 03/23/2024: ALT 28; AST 30; BUN 27; Creatinine, Ser 0.72; Hemoglobin 12.7; Platelets 174; Potassium 4.0; Sodium 134   Other Studies Reviewed Today:  No interval cardiac testing for review today.  Assessment and Plan:  1.  Multivessel CAD status post CABG in April 2021 with LIMA to LAD, SVG to diagonal, and SVG to RCA.  Follow-up Lexiscan  Myoview  in October 2021 was low risk  indicating possible small inferior scar but no ischemia and LVEF greater than 65%.  No increasing angina or nitroglycerin  requirement.  Continue aspirin  81 mg daily, Imdur  15 mg daily, and Crestor  20 mg daily.   2.  Primary hypertension.  No changes made to present regimen.  She is on Norvasc  7.5 mg daily, Cozaar  50 mg daily, and Lopressor  50 mg twice daily.   3.  PAD.  History of infrarenal abdominal aortic penetrating ulcer status post stent graft in 2009.  Also history of renal artery stenosis status post right renal artery stent intervention in 2013 and subsequent intervention to same vessel in 2015 by Dr. Darron.   4.  Mixed hyperlipidemia.  HDL 56 and LDL 88 in July 7975.  Continue Crestor  20 mg daily.   5.  Asymptomatic carotid artery disease, follow-up carotid Dopplers in July 2024 demonstrated 1 to 39% bilateral ICA stenosis.  Continue aspirin  and statin.   Disposition:  Follow up 6 months.  Signed, Jayson JUDITHANN Sierras, M.D., F.A.C.C. [facility]

## 2024-05-28 NOTE — Patient Instructions (Addendum)

## 2024-06-11 DIAGNOSIS — I10 Essential (primary) hypertension: Secondary | ICD-10-CM | POA: Diagnosis not present

## 2024-06-22 DIAGNOSIS — Z1339 Encounter for screening examination for other mental health and behavioral disorders: Secondary | ICD-10-CM | POA: Diagnosis not present

## 2024-06-22 DIAGNOSIS — Z7189 Other specified counseling: Secondary | ICD-10-CM | POA: Diagnosis not present

## 2024-06-22 DIAGNOSIS — E785 Hyperlipidemia, unspecified: Secondary | ICD-10-CM | POA: Diagnosis not present

## 2024-06-22 DIAGNOSIS — I10 Essential (primary) hypertension: Secondary | ICD-10-CM | POA: Diagnosis not present

## 2024-06-22 DIAGNOSIS — R5383 Other fatigue: Secondary | ICD-10-CM | POA: Diagnosis not present

## 2024-06-22 DIAGNOSIS — Z0000 Encounter for general adult medical examination without abnormal findings: Secondary | ICD-10-CM | POA: Diagnosis not present

## 2024-06-22 DIAGNOSIS — Z79899 Other long term (current) drug therapy: Secondary | ICD-10-CM | POA: Diagnosis not present

## 2024-06-22 DIAGNOSIS — Z299 Encounter for prophylactic measures, unspecified: Secondary | ICD-10-CM | POA: Diagnosis not present

## 2024-06-22 DIAGNOSIS — Z1331 Encounter for screening for depression: Secondary | ICD-10-CM | POA: Diagnosis not present

## 2024-06-23 DIAGNOSIS — H02833 Dermatochalasis of right eye, unspecified eyelid: Secondary | ICD-10-CM | POA: Diagnosis not present

## 2024-06-23 DIAGNOSIS — Z79899 Other long term (current) drug therapy: Secondary | ICD-10-CM | POA: Diagnosis not present

## 2024-06-23 DIAGNOSIS — H02836 Dermatochalasis of left eye, unspecified eyelid: Secondary | ICD-10-CM | POA: Diagnosis not present

## 2024-06-23 DIAGNOSIS — H35721 Serous detachment of retinal pigment epithelium, right eye: Secondary | ICD-10-CM | POA: Diagnosis not present

## 2024-06-23 DIAGNOSIS — H35722 Serous detachment of retinal pigment epithelium, left eye: Secondary | ICD-10-CM | POA: Diagnosis not present

## 2024-06-23 DIAGNOSIS — H02054 Trichiasis without entropian left upper eyelid: Secondary | ICD-10-CM | POA: Diagnosis not present

## 2024-06-23 DIAGNOSIS — H353132 Nonexudative age-related macular degeneration, bilateral, intermediate dry stage: Secondary | ICD-10-CM | POA: Diagnosis not present

## 2024-06-23 DIAGNOSIS — R5383 Other fatigue: Secondary | ICD-10-CM | POA: Diagnosis not present

## 2024-06-23 DIAGNOSIS — E785 Hyperlipidemia, unspecified: Secondary | ICD-10-CM | POA: Diagnosis not present

## 2024-06-23 DIAGNOSIS — H353221 Exudative age-related macular degeneration, left eye, with active choroidal neovascularization: Secondary | ICD-10-CM | POA: Diagnosis not present

## 2024-07-01 ENCOUNTER — Other Ambulatory Visit: Payer: Self-pay | Admitting: Student

## 2024-07-28 ENCOUNTER — Telehealth: Payer: Self-pay | Admitting: Cardiology

## 2024-07-28 DIAGNOSIS — E119 Type 2 diabetes mellitus without complications: Secondary | ICD-10-CM | POA: Diagnosis not present

## 2024-07-28 DIAGNOSIS — D709 Neutropenia, unspecified: Secondary | ICD-10-CM | POA: Diagnosis not present

## 2024-07-28 DIAGNOSIS — G4700 Insomnia, unspecified: Secondary | ICD-10-CM | POA: Diagnosis not present

## 2024-07-28 DIAGNOSIS — Z299 Encounter for prophylactic measures, unspecified: Secondary | ICD-10-CM | POA: Diagnosis not present

## 2024-07-28 DIAGNOSIS — I10 Essential (primary) hypertension: Secondary | ICD-10-CM | POA: Diagnosis not present

## 2024-07-28 MED ORDER — CLONIDINE HCL 0.1 MG PO TABS: 0.1000 mg | ORAL_TABLET | Freq: Every day | ORAL | 0 refills | Status: AC | PRN

## 2024-07-28 NOTE — Telephone Encounter (Signed)
 RX sent in

## 2024-07-28 NOTE — Telephone Encounter (Signed)
*  STAT* If patient is at the pharmacy, call can be transferred to refill team.   1. Which medications need to be refilled? (please list name of each medication and dose if known)   cloNIDine  (CATAPRES ) 0.1 MG tablet    2. Which pharmacy/location (including street and city if local pharmacy) is medication to be sent to? [facility] - [place], [place] - [address]   3. Do they need a 30 day or 90 day supply?   30 day supply

## 2024-07-30 DIAGNOSIS — B078 Other viral warts: Secondary | ICD-10-CM | POA: Diagnosis not present

## 2024-07-30 DIAGNOSIS — L820 Inflamed seborrheic keratosis: Secondary | ICD-10-CM | POA: Diagnosis not present

## 2024-08-11 DIAGNOSIS — I10 Essential (primary) hypertension: Secondary | ICD-10-CM | POA: Diagnosis not present

## 2024-08-18 DIAGNOSIS — H35721 Serous detachment of retinal pigment epithelium, right eye: Secondary | ICD-10-CM | POA: Diagnosis not present

## 2024-08-18 DIAGNOSIS — H353221 Exudative age-related macular degeneration, left eye, with active choroidal neovascularization: Secondary | ICD-10-CM | POA: Diagnosis not present

## 2024-08-18 DIAGNOSIS — H02833 Dermatochalasis of right eye, unspecified eyelid: Secondary | ICD-10-CM | POA: Diagnosis not present

## 2024-08-18 DIAGNOSIS — H353132 Nonexudative age-related macular degeneration, bilateral, intermediate dry stage: Secondary | ICD-10-CM | POA: Diagnosis not present

## 2024-08-18 DIAGNOSIS — H35722 Serous detachment of retinal pigment epithelium, left eye: Secondary | ICD-10-CM | POA: Diagnosis not present

## 2024-08-18 DIAGNOSIS — H02836 Dermatochalasis of left eye, unspecified eyelid: Secondary | ICD-10-CM | POA: Diagnosis not present

## 2024-08-18 DIAGNOSIS — H02054 Trichiasis without entropian left upper eyelid: Secondary | ICD-10-CM | POA: Diagnosis not present

## 2024-08-24 DIAGNOSIS — M9901 Segmental and somatic dysfunction of cervical region: Secondary | ICD-10-CM | POA: Diagnosis not present

## 2024-08-24 DIAGNOSIS — M9902 Segmental and somatic dysfunction of thoracic region: Secondary | ICD-10-CM | POA: Diagnosis not present

## 2024-08-24 DIAGNOSIS — M546 Pain in thoracic spine: Secondary | ICD-10-CM | POA: Diagnosis not present

## 2024-08-25 DIAGNOSIS — H43813 Vitreous degeneration, bilateral: Secondary | ICD-10-CM | POA: Diagnosis not present

## 2024-08-26 DIAGNOSIS — M9901 Segmental and somatic dysfunction of cervical region: Secondary | ICD-10-CM | POA: Diagnosis not present

## 2024-08-26 DIAGNOSIS — M546 Pain in thoracic spine: Secondary | ICD-10-CM | POA: Diagnosis not present

## 2024-08-26 DIAGNOSIS — M9902 Segmental and somatic dysfunction of thoracic region: Secondary | ICD-10-CM | POA: Diagnosis not present

## 2024-08-27 DIAGNOSIS — M9901 Segmental and somatic dysfunction of cervical region: Secondary | ICD-10-CM | POA: Diagnosis not present

## 2024-08-27 DIAGNOSIS — M9902 Segmental and somatic dysfunction of thoracic region: Secondary | ICD-10-CM | POA: Diagnosis not present

## 2024-08-27 DIAGNOSIS — M546 Pain in thoracic spine: Secondary | ICD-10-CM | POA: Diagnosis not present

## 2024-08-31 DIAGNOSIS — M9902 Segmental and somatic dysfunction of thoracic region: Secondary | ICD-10-CM | POA: Diagnosis not present

## 2024-08-31 DIAGNOSIS — Z23 Encounter for immunization: Secondary | ICD-10-CM | POA: Diagnosis not present

## 2024-08-31 DIAGNOSIS — M9901 Segmental and somatic dysfunction of cervical region: Secondary | ICD-10-CM | POA: Diagnosis not present

## 2024-08-31 DIAGNOSIS — M546 Pain in thoracic spine: Secondary | ICD-10-CM | POA: Diagnosis not present

## 2024-09-03 DIAGNOSIS — M9902 Segmental and somatic dysfunction of thoracic region: Secondary | ICD-10-CM | POA: Diagnosis not present

## 2024-09-03 DIAGNOSIS — M9901 Segmental and somatic dysfunction of cervical region: Secondary | ICD-10-CM | POA: Diagnosis not present

## 2024-09-03 DIAGNOSIS — M546 Pain in thoracic spine: Secondary | ICD-10-CM | POA: Diagnosis not present

## 2024-09-07 DIAGNOSIS — M9902 Segmental and somatic dysfunction of thoracic region: Secondary | ICD-10-CM | POA: Diagnosis not present

## 2024-09-07 DIAGNOSIS — M9901 Segmental and somatic dysfunction of cervical region: Secondary | ICD-10-CM | POA: Diagnosis not present

## 2024-09-07 DIAGNOSIS — M546 Pain in thoracic spine: Secondary | ICD-10-CM | POA: Diagnosis not present

## 2024-09-14 DIAGNOSIS — M9901 Segmental and somatic dysfunction of cervical region: Secondary | ICD-10-CM | POA: Diagnosis not present

## 2024-09-14 DIAGNOSIS — M9902 Segmental and somatic dysfunction of thoracic region: Secondary | ICD-10-CM | POA: Diagnosis not present

## 2024-09-14 DIAGNOSIS — M546 Pain in thoracic spine: Secondary | ICD-10-CM | POA: Diagnosis not present

## 2024-09-21 ENCOUNTER — Other Ambulatory Visit: Payer: Self-pay

## 2024-09-21 DIAGNOSIS — C182 Malignant neoplasm of ascending colon: Secondary | ICD-10-CM

## 2024-09-22 ENCOUNTER — Ambulatory Visit (HOSPITAL_COMMUNITY)

## 2024-09-22 ENCOUNTER — Inpatient Hospital Stay

## 2024-09-29 ENCOUNTER — Inpatient Hospital Stay: Admitting: Oncology

## 2024-10-01 DIAGNOSIS — L820 Inflamed seborrheic keratosis: Secondary | ICD-10-CM | POA: Diagnosis not present

## 2024-10-01 DIAGNOSIS — B078 Other viral warts: Secondary | ICD-10-CM | POA: Diagnosis not present

## 2024-10-19 ENCOUNTER — Telehealth: Payer: Self-pay | Admitting: Cardiology

## 2024-10-19 MED ORDER — LOSARTAN POTASSIUM 50 MG PO TABS: 50.0000 mg | ORAL_TABLET | Freq: Every day | ORAL | 2 refills | Status: AC

## 2024-10-19 NOTE — Telephone Encounter (Signed)
 Pt scheduled 12/21/24, refill sent.

## 2024-10-19 NOTE — Telephone Encounter (Signed)
*  STAT* If patient is at the pharmacy, call can be transferred to refill team.   1. Which medications need to be refilled? (please list name of each medication and dose if known)   losartan  (COZAAR ) 50 MG tablet   2. Would you like to learn more about the convenience, safety, & potential cost savings by using the [facility]?   3. Are you open to using the [facility] (Type [facility]. ).  4. Which pharmacy/location (including street and city if local pharmacy) is medication to be sent to?  [facility] - [place], [place] - [address]   5. Do they need a 30 day or 90 day supply?   90 day  Patient stated she still has some medication left.  Patient has appointment scheduled with Dr. Debera on 2/9.

## 2024-10-20 DIAGNOSIS — H35722 Serous detachment of retinal pigment epithelium, left eye: Secondary | ICD-10-CM | POA: Diagnosis not present

## 2024-10-20 DIAGNOSIS — H35721 Serous detachment of retinal pigment epithelium, right eye: Secondary | ICD-10-CM | POA: Diagnosis not present

## 2024-10-20 DIAGNOSIS — H353132 Nonexudative age-related macular degeneration, bilateral, intermediate dry stage: Secondary | ICD-10-CM | POA: Diagnosis not present

## 2024-10-20 DIAGNOSIS — H04123 Dry eye syndrome of bilateral lacrimal glands: Secondary | ICD-10-CM | POA: Diagnosis not present

## 2024-10-20 DIAGNOSIS — H02054 Trichiasis without entropian left upper eyelid: Secondary | ICD-10-CM | POA: Diagnosis not present

## 2024-10-20 DIAGNOSIS — H353221 Exudative age-related macular degeneration, left eye, with active choroidal neovascularization: Secondary | ICD-10-CM | POA: Diagnosis not present

## 2024-11-09 ENCOUNTER — Encounter: Payer: Self-pay | Admitting: *Deleted

## 2024-11-24 ENCOUNTER — Inpatient Hospital Stay: Attending: Oncology

## 2024-11-24 ENCOUNTER — Ambulatory Visit (HOSPITAL_COMMUNITY)
Admission: RE | Admit: 2024-11-24 | Discharge: 2024-11-24 | Disposition: A | Discharge status: Home / Self Care | Source: Ambulatory Visit | Attending: Hematology | Admitting: Hematology

## 2024-11-24 DIAGNOSIS — I739 Peripheral vascular disease, unspecified: Secondary | ICD-10-CM | POA: Insufficient documentation

## 2024-11-24 DIAGNOSIS — C182 Malignant neoplasm of ascending colon: Secondary | ICD-10-CM | POA: Diagnosis present

## 2024-11-24 DIAGNOSIS — I2510 Atherosclerotic heart disease of native coronary artery without angina pectoris: Secondary | ICD-10-CM | POA: Insufficient documentation

## 2024-11-24 DIAGNOSIS — I10 Essential (primary) hypertension: Secondary | ICD-10-CM | POA: Diagnosis not present

## 2024-11-24 DIAGNOSIS — E785 Hyperlipidemia, unspecified: Secondary | ICD-10-CM | POA: Diagnosis not present

## 2024-11-24 DIAGNOSIS — D708 Other neutropenia: Secondary | ICD-10-CM | POA: Insufficient documentation

## 2024-11-24 LAB — CBC WITH DIFFERENTIAL/PLATELET
Abs Immature Granulocytes: 0.01 K/uL (ref 0.00–0.07)
Basophils Absolute: 0 K/uL (ref 0.0–0.1)
Basophils Relative: 0 %
Eosinophils Absolute: 0 K/uL (ref 0.0–0.5)
Eosinophils Relative: 1 %
HCT: 38 % (ref 36.0–46.0)
Hemoglobin: 12.5 g/dL (ref 12.0–15.0)
Immature Granulocytes: 1 %
Lymphocytes Relative: 43 %
Lymphs Abs: 0.8 K/uL (ref 0.7–4.0)
MCH: 28.9 pg (ref 26.0–34.0)
MCHC: 32.9 g/dL (ref 30.0–36.0)
MCV: 87.8 fL (ref 80.0–100.0)
Monocytes Absolute: 0.2 K/uL (ref 0.1–1.0)
Monocytes Relative: 12 %
Neutro Abs: 0.8 K/uL — ABNORMAL LOW (ref 1.7–7.7)
Neutrophils Relative %: 43 %
Platelets: 185 K/uL (ref 150–400)
RBC: 4.33 MIL/uL (ref 3.87–5.11)
RDW: 14.1 % (ref 11.5–15.5)
Smear Review: NORMAL
WBC: 1.8 K/uL — ABNORMAL LOW (ref 4.0–10.5)
nRBC: 0 % (ref 0.0–0.2)

## 2024-11-24 LAB — COMPREHENSIVE METABOLIC PANEL WITH GFR
ALT: 21 U/L (ref 0–44)
AST: 28 U/L (ref 15–41)
Albumin: 4.9 g/dL (ref 3.5–5.0)
Alkaline Phosphatase: 95 U/L (ref 38–126)
Anion gap: 13 (ref 5–15)
BUN: 24 mg/dL — ABNORMAL HIGH (ref 8–23)
CO2: 24 mmol/L (ref 22–32)
Calcium: 10.2 mg/dL (ref 8.9–10.3)
Chloride: 100 mmol/L (ref 98–111)
Creatinine, Ser: 0.82 mg/dL (ref 0.44–1.00)
GFR, Estimated: >60 mL/min
Glucose, Bld: 99 mg/dL (ref 70–99)
Potassium: 4.4 mmol/L (ref 3.5–5.1)
Sodium: 137 mmol/L (ref 135–145)
Total Bilirubin: 0.7 mg/dL (ref 0.0–1.2)
Total Protein: 7.2 g/dL (ref 6.5–8.1)

## 2024-11-24 LAB — LACTATE DEHYDROGENASE: LDH: 190 U/L (ref 105–235)

## 2024-11-24 MED ORDER — IOHEXOL 9 MG/ML PO SOLN
ORAL | Status: AC
  Filled 2024-11-24: qty 1000

## 2024-11-24 MED ORDER — IOHEXOL 300 MG/ML  SOLN
80.0000 mL | Freq: Once | INTRAMUSCULAR | Status: AC | PRN
  Administered 2024-11-24: 80 mL via INTRAVENOUS

## 2024-11-25 LAB — CEA: CEA: 2.1 ng/mL (ref 0.0–4.7)

## 2024-11-25 LAB — KAPPA/LAMBDA LIGHT CHAINS
Kappa free light chain: 25.4 mg/L — ABNORMAL HIGH (ref 3.3–19.4)
Kappa, lambda light chain ratio: 1.91 — ABNORMAL HIGH (ref 0.26–1.65)
Lambda free light chains: 13.3 mg/L (ref 5.7–26.3)

## 2024-11-26 LAB — IMMUNOFIXATION ELECTROPHORESIS
IgA: 231 mg/dL (ref 64–422)
IgG (Immunoglobin G), Serum: 810 mg/dL (ref 586–1602)
IgM (Immunoglobulin M), Srm: 69 mg/dL (ref 26–217)
Total Protein ELP: 7 g/dL (ref 6.0–8.5)

## 2024-12-02 LAB — MYELOID NGS

## 2024-12-02 LAB — MISC LABCORP TEST (SEND OUT): LabCorp test name: 452312

## 2024-12-08 ENCOUNTER — Inpatient Hospital Stay: Admitting: Oncology

## 2024-12-08 DIAGNOSIS — C182 Malignant neoplasm of ascending colon: Secondary | ICD-10-CM

## 2024-12-08 DIAGNOSIS — D708 Other neutropenia: Secondary | ICD-10-CM

## 2024-12-08 NOTE — Progress Notes (Signed)
 "  [name] OFFICE PROGRESS NOTE  Rosamond Leta NOVAK, MD  ASSESSMENT & PLAN:  I connected with [name] on 12/10/24 at 11:00 AM EST by telephone visit and verified that I am speaking with the correct person using two identifiers.   I discussed the limitations, risks, security and privacy concerns of performing an evaluation and management service by telemedicine and the availability of in-person appointments. I also discussed with the patient that there may be a patient responsible charge related to this service. The patient expressed understanding and agreed to proceed.   Other persons participating in the visit and their role in the encounter: NP, Patient   Patients location: Home   Providers location: Clinic    Assessment & Plan Cancer of ascending colon (HCC) - She denies any change in bowel habits or bleeding per rectum. - Last CTAP on 11/24/2024: No evidence of recurrence or metastatic disease. - Labs from 11/24/2024: Normal LFTs.  CEA was normal at 2.3. - RTC 6 months for follow-up with repeat CTAP with contrast and CEA level. Hypercalcemia - She has long history of on and off mild hypercalcemia since 2015.  Vitamin D  supplements were discontinued in May 2024.  She is not on any calcium  supplements. -Calcium  is 10.2 today.  -Continue to monitor. Other neutropenia - She has leukopenia since beginning of 2023 with no recurrent infections.  Her ANC is gradually getting worse. - CTAP did not show splenomegaly.  Multiple myeloma panel was negative except mildly elevated FLC ratio of 1.91 and kappa light chains of 25.4. - Flow cytometry did not show any abnormal B or T-cell population. - Differential diagnosis includes MDS.  Bone marrow biopsy was recommended but she deferred at this time. 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  TET2 has been identified as a tumor suppressor and hematological malignancies and mutations are common in hematological  cancers including MDS, CML and atypical CML. -She denies any recurrent infections or recent antibiotic use. -Patient is not interested in bone marrow biopsy and would prefer simple surveillance. -Discussed case with MD who recommends close surveillance with labs every 3 months.  Orders Placed This Encounter  Procedures   CT ABDOMEN PELVIS W CONTRAST    Standing Status:   Future    Expected Date:   06/10/2025    Expiration Date:   12/08/2025    If indicated for the ordered procedure, I authorize the administration of contrast media per Radiology protocol:   Yes    Does the patient have a contrast media/X-ray dye allergy?:   No    Preferred imaging location?:   [facility]    Release to patient:   Immediate [1]    If indicated for the ordered procedure, I authorize the administration of oral contrast media per Radiology protocol:   Yes   CBC with Differential/Platelet    Standing Status:   Future    Expected Date:   06/07/2025    Expiration Date:   09/05/2025    Release to patient:   Immediate   Comprehensive metabolic panel with GFR    Standing Status:   Future    Expected Date:   06/07/2025    Expiration Date:   09/05/2025    Release to patient:   Immediate   Lactate dehydrogenase    Standing Status:   Future    Expected Date:   06/07/2025    Expiration Date:   09/05/2025  Release to patient:   Immediate   Kappa/lambda light chains    Standing Status:   Future    Expected Date:   06/07/2025    Expiration Date:   09/05/2025   CEA    Standing Status:   Future    Expected Date:   06/07/2025    Expiration Date:   09/05/2025   Immunofixation electrophoresis    Standing Status:   Future    Expected Date:   06/07/2025    Expiration Date:   09/05/2025    Release to patient:   Immediate    INTERVAL HISTORY: Patient returns for follow-up for history of colon cancer and neutropenia.  Patient met with cardiology Dr. Debera on 05/28/2024 for CAD, hyperlipidemia, PVD and history  of hypertension.  Everything appeared stable.  Denies any interval hospitalizations, surgeries or changes to baseline health.  She did recently have a CT scan for follow-up for colon cancer and she is here to review results.  Denies any bright red blood per rectum, melena or hematochezia.  Denies any changes in her bowel habits, abdominal pain or nausea/vomiting.  We reviewed CT abdomen, CBC, CMP, LDH, kappa lambda light chains, CEA, immunofixation and myeloid NGS panel.  SUMMARY OF HEMATOLOGIC HISTORY: Oncology History  Cancer of ascending colon (HCC)  01/15/2021 Initial Diagnosis   Cancer of ascending colon (HCC)   01/15/2021 Cancer Staging   Staging form: Colon and Rectum, AJCC 8th Edition - Clinical stage from 01/15/2021: Stage IIIC (cT3, cN2b, cM0) - Signed by Rogers Hai, MD on 01/15/2021 Stage prefix: Initial diagnosis Microsatellite instability (MSI): Unstable high    1.  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 - CT AP with contrast on 08/10/2020 with abnormal appearance of right colon with colocolonic intussusception with wall thickening and abnormally enlarged adjacent right mesenteric lymph nodes. - CT chest with contrast on 09/07/2020 with no evidence of metastatic disease in the chest. -Colonoscopy on 09/15/2020-large spherical, ulcerated, partially necrotic tumor in the transverse/right colon.  Scope could not be maneuvered more proximally to the tumor. - Right hemicolectomy on 10/28/2020 by Dr. Teresa. - Pathology shows invasive moderately to poorly differentiated carcinoma involving the mid ascending colon, spanning 10 cm, tumor invades through muscularis propria into pericolorectal tissue, margins negative.  9/39 lymph nodes involved.  Lymphovascular invasion present.  PT3PN2B. - Loss of MLH1 and PMS2. - BRAF V600 E mutation positive.  MSI-high. -She has refused adjuvant chemotherapy.   2.   Social/family history: - She is accompanied by her daughter today.  Lives by herself and is independent of ADLs and IADLs.  Worked as a scientist, physiological. - Maternal aunt had ovarian cancer.  No other malignancies.  CBC    Component Value Date/Time   WBC 1.8 (L) 11/24/2024 1131   RBC 4.33 11/24/2024 1131   HGB 12.5 11/24/2024 1131   HCT 38.0 11/24/2024 1131   PLT 185 11/24/2024 1131   MCV 87.8 11/24/2024 1131   MCH 28.9 11/24/2024 1131   MCHC 32.9 11/24/2024 1131   RDW 14.1 11/24/2024 1131   LYMPHSABS 0.8 11/24/2024 1131   MONOABS 0.2 11/24/2024 1131   EOSABS 0.0 11/24/2024 1131   BASOSABS 0.0 11/24/2024 1131       Latest Ref Rng & Units 11/24/2024   11:31 AM 03/23/2024    2:09 PM 09/11/2023   10:38 AM  CMP  Glucose 70 - 99 mg/dL 99  833  876   BUN 8 -  23 mg/dL 24  27  22    Creatinine 0.44 - 1.00 mg/dL 9.17  9.27  9.26   Sodium 135 - 145 mmol/L 137  134  134   Potassium 3.5 - 5.1 mmol/L 4.4  4.0  4.0   Chloride 98 - 111 mmol/L 100  100  97   CO2 22 - 32 mmol/L 24  26  27    Calcium  8.9 - 10.3 mg/dL 89.7  89.7  89.4   Total Protein 6.5 - 8.1 g/dL 7.2  7.4  7.6   Total Bilirubin 0.0 - 1.2 mg/dL 0.7  0.6  0.9   Alkaline Phos 38 - 126 U/L 95  103  83   AST 15 - 41 U/L 28  30  24    ALT 0 - 44 U/L 21  28  23       Lab Results  Component Value Date   FERRITIN 243 09/19/2023   VITAMINB12 651 09/19/2023    There were no vitals filed for this visit.  Review of System:  Review of Systems  Constitutional:  Positive for malaise/fatigue.  Gastrointestinal:  Negative for abdominal pain, constipation, diarrhea and vomiting.  Psychiatric/Behavioral:  The patient is nervous/anxious.     Physical Exam: Physical Exam Neurological:     Mental Status: She is alert and oriented to person, place, and time.     I provided 20 minutes of non face-to-face telephone visit time during this encounter, and > 50% was spent counseling as documented under my assessment & plan.   Delon Hope, NP 12/10/2024 9:05 AM "

## 2024-12-08 NOTE — Assessment & Plan Note (Addendum)
-   She denies any change in bowel habits or bleeding per rectum. - Last CTAP on 11/24/2024: No evidence of recurrence or metastatic disease. - Labs from 11/24/2024: Normal LFTs.  CEA was normal at 2.3. - RTC 6 months for follow-up with repeat CTAP with contrast and CEA level.

## 2024-12-08 NOTE — Assessment & Plan Note (Addendum)
-   She has leukopenia since beginning of 2023 with no recurrent infections.  Her ANC is gradually getting worse. - CTAP did not show splenomegaly.  Multiple myeloma panel was negative except mildly elevated FLC ratio of 1.91 and kappa light chains of 25.4. - Flow cytometry did not show any abnormal B or T-cell population. - Differential diagnosis includes MDS.  Bone marrow biopsy was recommended but she deferred at this time. 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  TET2 has been identified as a tumor suppressor and hematological malignancies and mutations are common in hematological cancers including MDS, CML and atypical CML. -She denies any recurrent infections or recent antibiotic use. -Patient is not interested in bone marrow biopsy and would prefer simple surveillance. -Discussed case with MD who recommends close surveillance with labs every 3 months.

## 2024-12-08 NOTE — Assessment & Plan Note (Addendum)
-   She has long history of on and off mild hypercalcemia since 2015.  Vitamin D  supplements were discontinued in May 2024.  She is not on any calcium  supplements. -Calcium  is 10.2 today.  -Continue to monitor.

## 2024-12-21 ENCOUNTER — Ambulatory Visit: Admitting: Cardiology

## 2025-06-01 ENCOUNTER — Inpatient Hospital Stay

## 2025-06-01 ENCOUNTER — Other Ambulatory Visit (HOSPITAL_COMMUNITY)

## 2025-06-08 ENCOUNTER — Inpatient Hospital Stay: Admitting: Oncology
